# Patient Record
Sex: Male | Born: 1970 | Race: White | Hispanic: No | Smoking: Current every day smoker
Health system: Southern US, Community
[De-identification: ages and names within clinical notes are randomized; demographics above are authoritative.]

## PROBLEM LIST (undated history)

## (undated) DIAGNOSIS — S68012A Complete traumatic metacarpophalangeal amputation of left thumb, initial encounter: Secondary | ICD-10-CM

## (undated) DIAGNOSIS — Z972 Presence of dental prosthetic device (complete) (partial): Secondary | ICD-10-CM

## (undated) DIAGNOSIS — G894 Chronic pain syndrome: Secondary | ICD-10-CM

## (undated) DIAGNOSIS — K219 Gastro-esophageal reflux disease without esophagitis: Secondary | ICD-10-CM

## (undated) DIAGNOSIS — M5432 Sciatica, left side: Secondary | ICD-10-CM

## (undated) DIAGNOSIS — IMO0002 Reserved for concepts with insufficient information to code with codable children: Secondary | ICD-10-CM

## (undated) DIAGNOSIS — G5600 Carpal tunnel syndrome, unspecified upper limb: Secondary | ICD-10-CM

## (undated) DIAGNOSIS — M25549 Pain in joints of unspecified hand: Secondary | ICD-10-CM

## (undated) DIAGNOSIS — M5431 Sciatica, right side: Secondary | ICD-10-CM

## (undated) DIAGNOSIS — D122 Benign neoplasm of ascending colon: Secondary | ICD-10-CM

## (undated) DIAGNOSIS — M1990 Unspecified osteoarthritis, unspecified site: Secondary | ICD-10-CM

## (undated) DIAGNOSIS — M549 Dorsalgia, unspecified: Secondary | ICD-10-CM

## (undated) DIAGNOSIS — G8929 Other chronic pain: Secondary | ICD-10-CM

## (undated) DIAGNOSIS — M542 Cervicalgia: Secondary | ICD-10-CM

## (undated) DIAGNOSIS — D124 Benign neoplasm of descending colon: Secondary | ICD-10-CM

## (undated) DIAGNOSIS — K269 Duodenal ulcer, unspecified as acute or chronic, without hemorrhage or perforation: Secondary | ICD-10-CM

## (undated) HISTORY — PX: TONSILLECTOMY: SUR1361

## (undated) HISTORY — PX: JOINT REPLACEMENT: SHX530

## (undated) HISTORY — DX: Chronic pain syndrome: G89.4

## (undated) HISTORY — PX: HIP SURGERY: SHX245

## (undated) HISTORY — PX: HERNIA REPAIR: SHX51

## (undated) HISTORY — DX: Complete traumatic metacarpophalangeal amputation of left thumb, initial encounter: S68.012A

## (undated) HISTORY — PX: KNEE SURGERY: SHX244

## (undated) HISTORY — DX: Benign neoplasm of ascending colon: D12.2

## (undated) HISTORY — DX: Sciatica, right side: M54.32

## (undated) HISTORY — PX: OTHER SURGICAL HISTORY: SHX169

## (undated) HISTORY — PX: REPLACEMENT TOTAL KNEE: SUR1224

## (undated) HISTORY — DX: Carpal tunnel syndrome, unspecified upper limb: G56.00

## (undated) HISTORY — DX: Sciatica, right side: M54.31

## (undated) HISTORY — PX: SHOULDER SURGERY: SHX246

## (undated) HISTORY — DX: Benign neoplasm of descending colon: D12.4

## (undated) HISTORY — DX: Duodenal ulcer, unspecified as acute or chronic, without hemorrhage or perforation: K26.9

## (undated) HISTORY — PX: SPINAL FUSION: SHX223

---
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
## 2004-12-25 HISTORY — PX: HIP SURGERY: SHX245

## 2005-08-27 ENCOUNTER — Emergency Department: Payer: Self-pay | Admitting: Emergency Medicine

## 2005-08-30 ENCOUNTER — Ambulatory Visit: Payer: Self-pay | Admitting: Emergency Medicine

## 2005-09-03 ENCOUNTER — Emergency Department: Payer: Self-pay | Admitting: Internal Medicine

## 2006-08-05 ENCOUNTER — Emergency Department: Payer: Self-pay | Admitting: Emergency Medicine

## 2006-09-09 ENCOUNTER — Emergency Department: Payer: Self-pay | Admitting: General Practice

## 2006-09-30 ENCOUNTER — Emergency Department: Payer: Self-pay | Admitting: Emergency Medicine

## 2007-06-10 ENCOUNTER — Emergency Department: Payer: Self-pay | Admitting: Unknown Physician Specialty

## 2007-07-09 ENCOUNTER — Emergency Department: Payer: Self-pay | Admitting: Emergency Medicine

## 2007-08-12 ENCOUNTER — Emergency Department: Payer: Self-pay | Admitting: Emergency Medicine

## 2007-08-19 ENCOUNTER — Other Ambulatory Visit: Payer: Self-pay

## 2007-08-19 ENCOUNTER — Emergency Department: Payer: Self-pay | Admitting: Emergency Medicine

## 2007-09-18 ENCOUNTER — Emergency Department: Payer: Self-pay | Admitting: Emergency Medicine

## 2008-01-03 ENCOUNTER — Emergency Department: Payer: Self-pay | Admitting: Emergency Medicine

## 2008-01-29 ENCOUNTER — Emergency Department: Payer: Self-pay | Admitting: Emergency Medicine

## 2008-01-29 ENCOUNTER — Other Ambulatory Visit: Payer: Self-pay

## 2008-02-01 ENCOUNTER — Emergency Department: Payer: Self-pay | Admitting: Internal Medicine

## 2008-02-16 ENCOUNTER — Other Ambulatory Visit: Payer: Self-pay

## 2008-02-16 ENCOUNTER — Emergency Department: Payer: Self-pay | Admitting: Unknown Physician Specialty

## 2008-02-27 ENCOUNTER — Emergency Department: Payer: Self-pay | Admitting: Emergency Medicine

## 2008-03-11 ENCOUNTER — Other Ambulatory Visit: Payer: Self-pay

## 2008-03-11 ENCOUNTER — Emergency Department: Payer: Self-pay | Admitting: Emergency Medicine

## 2008-04-21 ENCOUNTER — Emergency Department: Payer: Self-pay | Admitting: Emergency Medicine

## 2008-08-05 ENCOUNTER — Emergency Department: Payer: Self-pay | Admitting: Emergency Medicine

## 2008-08-11 ENCOUNTER — Emergency Department: Payer: Self-pay | Admitting: Emergency Medicine

## 2008-08-28 ENCOUNTER — Emergency Department: Payer: Self-pay | Admitting: Internal Medicine

## 2008-09-09 ENCOUNTER — Emergency Department: Payer: Self-pay | Admitting: Internal Medicine

## 2008-09-27 ENCOUNTER — Emergency Department: Payer: Self-pay | Admitting: Emergency Medicine

## 2008-10-10 ENCOUNTER — Emergency Department: Payer: Self-pay | Admitting: Unknown Physician Specialty

## 2008-10-22 ENCOUNTER — Emergency Department (HOSPITAL_COMMUNITY): Admission: EM | Admit: 2008-10-22 | Discharge: 2008-10-22 | Payer: Self-pay | Admitting: Emergency Medicine

## 2008-10-25 ENCOUNTER — Emergency Department: Payer: Self-pay | Admitting: Emergency Medicine

## 2008-11-06 ENCOUNTER — Emergency Department (HOSPITAL_COMMUNITY): Admission: EM | Admit: 2008-11-06 | Discharge: 2008-11-06 | Payer: Self-pay | Admitting: Emergency Medicine

## 2008-12-21 ENCOUNTER — Emergency Department: Payer: Self-pay | Admitting: Emergency Medicine

## 2009-02-08 ENCOUNTER — Emergency Department (HOSPITAL_COMMUNITY): Admission: EM | Admit: 2009-02-08 | Discharge: 2009-02-08 | Payer: Self-pay | Admitting: Emergency Medicine

## 2009-02-11 ENCOUNTER — Emergency Department: Payer: Self-pay | Admitting: Emergency Medicine

## 2009-02-12 ENCOUNTER — Emergency Department: Payer: Self-pay | Admitting: Emergency Medicine

## 2009-03-10 ENCOUNTER — Emergency Department: Payer: Self-pay | Admitting: Emergency Medicine

## 2009-03-20 ENCOUNTER — Emergency Department: Payer: Self-pay | Admitting: Emergency Medicine

## 2009-05-04 ENCOUNTER — Encounter
Admission: RE | Admit: 2009-05-04 | Discharge: 2009-05-04 | Payer: Self-pay | Admitting: Physical Medicine & Rehabilitation

## 2009-08-22 ENCOUNTER — Emergency Department: Payer: Self-pay | Admitting: Emergency Medicine

## 2010-10-04 ENCOUNTER — Emergency Department: Payer: Self-pay | Admitting: Emergency Medicine

## 2010-12-15 ENCOUNTER — Emergency Department (HOSPITAL_COMMUNITY)
Admission: EM | Admit: 2010-12-15 | Discharge: 2010-12-15 | Payer: Self-pay | Source: Home / Self Care | Admitting: Emergency Medicine

## 2010-12-19 ENCOUNTER — Emergency Department (HOSPITAL_COMMUNITY)
Admission: EM | Admit: 2010-12-19 | Discharge: 2010-12-19 | Payer: Self-pay | Source: Home / Self Care | Admitting: Emergency Medicine

## 2010-12-25 DEATH — deceased

## 2011-02-08 DIAGNOSIS — M5020 Other cervical disc displacement, unspecified cervical region: Secondary | ICD-10-CM | POA: Insufficient documentation

## 2011-02-28 DIAGNOSIS — G5600 Carpal tunnel syndrome, unspecified upper limb: Secondary | ICD-10-CM | POA: Insufficient documentation

## 2011-07-19 ENCOUNTER — Emergency Department: Payer: Self-pay | Admitting: *Deleted

## 2011-08-06 ENCOUNTER — Emergency Department: Payer: Self-pay | Admitting: Internal Medicine

## 2011-09-05 ENCOUNTER — Emergency Department: Payer: Self-pay | Admitting: Emergency Medicine

## 2011-09-16 ENCOUNTER — Ambulatory Visit: Payer: Self-pay | Admitting: Internal Medicine

## 2011-09-17 ENCOUNTER — Ambulatory Visit: Payer: Self-pay

## 2011-09-18 ENCOUNTER — Emergency Department: Payer: Self-pay | Admitting: Emergency Medicine

## 2011-10-27 ENCOUNTER — Emergency Department: Payer: Self-pay | Admitting: Internal Medicine

## 2011-12-26 DIAGNOSIS — S68111A Complete traumatic metacarpophalangeal amputation of left index finger, initial encounter: Secondary | ICD-10-CM

## 2011-12-26 HISTORY — DX: Complete traumatic metacarpophalangeal amputation of left index finger, initial encounter: S68.111A

## 2011-12-26 HISTORY — PX: AMPUTATION FINGER / THUMB: SUR24

## 2012-04-01 ENCOUNTER — Ambulatory Visit: Payer: Self-pay | Admitting: Orthopedic Surgery

## 2012-05-23 ENCOUNTER — Ambulatory Visit: Payer: Self-pay | Admitting: Rehabilitation

## 2012-07-01 ENCOUNTER — Ambulatory Visit: Payer: Self-pay | Admitting: Internal Medicine

## 2012-07-18 ENCOUNTER — Other Ambulatory Visit (HOSPITAL_COMMUNITY): Payer: Self-pay | Admitting: Orthopedic Surgery

## 2012-07-18 DIAGNOSIS — M25561 Pain in right knee: Secondary | ICD-10-CM

## 2012-07-29 ENCOUNTER — Ambulatory Visit (HOSPITAL_COMMUNITY)
Admission: RE | Admit: 2012-07-29 | Discharge: 2012-07-29 | Disposition: A | Discharge status: Home / Self Care | Payer: Medicaid Other | Source: Ambulatory Visit | Attending: Orthopedic Surgery | Admitting: Orthopedic Surgery

## 2012-07-29 DIAGNOSIS — W19XXXA Unspecified fall, initial encounter: Secondary | ICD-10-CM | POA: Insufficient documentation

## 2012-07-29 DIAGNOSIS — Z96659 Presence of unspecified artificial knee joint: Secondary | ICD-10-CM | POA: Insufficient documentation

## 2012-07-29 DIAGNOSIS — M25561 Pain in right knee: Secondary | ICD-10-CM

## 2012-07-29 DIAGNOSIS — M25569 Pain in unspecified knee: Secondary | ICD-10-CM | POA: Insufficient documentation

## 2012-07-30 ENCOUNTER — Other Ambulatory Visit: Payer: Self-pay | Admitting: Physical Medicine and Rehabilitation

## 2012-07-30 DIAGNOSIS — M545 Low back pain, unspecified: Secondary | ICD-10-CM

## 2012-07-31 ENCOUNTER — Other Ambulatory Visit: Payer: Self-pay | Admitting: Physical Medicine and Rehabilitation

## 2012-07-31 DIAGNOSIS — M545 Low back pain: Secondary | ICD-10-CM

## 2012-07-31 DIAGNOSIS — M79604 Pain in right leg: Secondary | ICD-10-CM

## 2012-08-01 ENCOUNTER — Ambulatory Visit
Admission: RE | Admit: 2012-08-01 | Discharge: 2012-08-01 | Disposition: A | Discharge status: Home / Self Care | Payer: Medicaid Other | Source: Ambulatory Visit | Attending: Physical Medicine and Rehabilitation | Admitting: Physical Medicine and Rehabilitation

## 2012-08-01 ENCOUNTER — Other Ambulatory Visit: Payer: Self-pay

## 2012-08-01 DIAGNOSIS — M79604 Pain in right leg: Secondary | ICD-10-CM

## 2012-08-01 DIAGNOSIS — M545 Low back pain: Secondary | ICD-10-CM

## 2012-08-05 ENCOUNTER — Encounter (HOSPITAL_COMMUNITY): Payer: Self-pay

## 2012-08-05 ENCOUNTER — Emergency Department (HOSPITAL_COMMUNITY)
Admission: EM | Admit: 2012-08-05 | Discharge: 2012-08-05 | Disposition: A | Discharge status: Home / Self Care | Payer: Medicaid Other | Attending: Emergency Medicine | Admitting: Emergency Medicine

## 2012-08-05 DIAGNOSIS — F17200 Nicotine dependence, unspecified, uncomplicated: Secondary | ICD-10-CM | POA: Insufficient documentation

## 2012-08-05 DIAGNOSIS — M549 Dorsalgia, unspecified: Secondary | ICD-10-CM | POA: Insufficient documentation

## 2012-08-05 DIAGNOSIS — M545 Low back pain, unspecified: Secondary | ICD-10-CM

## 2012-08-05 HISTORY — DX: Reserved for concepts with insufficient information to code with codable children: IMO0002

## 2012-08-05 MED ORDER — OXYCODONE-ACETAMINOPHEN 5-325 MG PO TABS: 2.0000 | ORAL_TABLET | ORAL | Status: AC | PRN

## 2012-08-05 NOTE — ED Notes (Signed)
Has orthopeadic md, here for pain med refill, needs neurosurgery for back surgery, has pain in legs and tingling in legs. Was told to come here for pain meds.

## 2012-08-05 NOTE — ED Provider Notes (Signed)
History     CSN: [id number]  Arrival date & time 08/05/12  1103   First MD Initiated Contact with Patient 08/05/12 1215      Chief Complaint  Patient presents with  . Back Pain  . Numbness    (Consider location/radiation/quality/duration/timing/severity/associated sxs/prior treatment) HPI Comments: Patient presents today with a chief complaint of lower back pain.  Patient has been evaluated by Orthopedics in the past and has an appointment scheduled with Neurosurgery in 2 days.  He reports that his pain is no different today than it has been in the past.  No acute injury or trauma.  He had a MRI done of his lumbar spine on 08/01/12 which showed broad-based disc herniation and facet hypertrophy at L3-4, L4-5, and L5-S1.  He presents today requesting more pain medication.  He had been taking Oxycodone 10mg /325 mg for the pain.     Patient is a 41 y.o. male presenting with back pain. The history is provided by the patient.  Back Pain  This is a chronic problem. The problem occurs constantly. The problem has not changed since onset.The pain is associated with no known injury. The pain is present in the lumbar spine. The pain radiates to the left thigh and right thigh. The symptoms are aggravated by bending and twisting (ambulation). Pertinent negatives include no fever, no numbness, no abdominal pain, no bowel incontinence, no perianal numbness, no bladder incontinence, no dysuria, no paresthesias, no paresis, no tingling and no weakness.    Past Medical History  Diagnosis Date  . Herniated disc     No past surgical history on file.  No family history on file.  History  Substance Use Topics  . Smoking status: Current Everyday Smoker  . Smokeless tobacco: Not on file  . Alcohol Use: No      Review of Systems  Constitutional: Negative for fever and chills.  Gastrointestinal: Negative for nausea, vomiting, abdominal pain and bowel incontinence.  Genitourinary: Negative for bladder  incontinence and dysuria.       No bowel or bladder incontinence No urinary retention  Musculoskeletal: Positive for back pain. Negative for gait problem.  Skin: Negative for color change and rash.  Neurological: Negative for tingling, weakness, numbness and paresthesias.    Allergies  Flexeril; Codeine; Latex; and Tramadol  Home Medications   Current Outpatient Rx  Name Route Sig Dispense Refill  . OXYCODONE-ACETAMINOPHEN 10-325 MG PO TABS Oral Take 1 tablet by mouth every 4 (four) hours as needed. For pain.    Marland Kitchen PREDNISONE (PAK) PO Oral Take by mouth daily. 6 day dose pak.      BP 170/108  Pulse 78  Temp 98.7 F (37.1 C) (Oral)  Resp 16  SpO2 99%  Physical Exam  Nursing note and vitals reviewed. Constitutional: He is oriented to person, place, and time. He appears well-developed and well-nourished.       Uncomfortable appearing  HENT:  Head: Normocephalic and atraumatic.  Neck: Normal range of motion and full passive range of motion without pain. Neck supple. No spinous process tenderness and no muscular tenderness present. No rigidity. Normal range of motion present. No Brudzinski's sign noted.  Cardiovascular: Normal rate, regular rhythm, normal heart sounds and intact distal pulses.   Pulmonary/Chest: Effort normal and breath sounds normal.  Musculoskeletal:       Cervical back: He exhibits normal range of motion, no tenderness, no bony tenderness and no pain.       Thoracic back: He exhibits no  tenderness, no bony tenderness and no pain.       Lumbar back: He exhibits tenderness, bony tenderness and pain. He exhibits no spasm and normal pulse.       Right foot: He exhibits no swelling.       Left foot: He exhibits no swelling.       Bilateral lower extremities nontender without color change, baseline range of motion of extremities with intact distal pulses, capillary refill less than 2 seconds bilaterally.  Pt has increased pain w ROM of lumbar spine. Pain w ambulation,  no sign of ataxia.  Neurological: He is alert and oriented to person, place, and time. He has normal strength and normal reflexes. No sensory deficit. Gait (no ataxia, slowed and hunched d/t pain ) abnormal.       Sensation at baseline for light touch in all 4 distal extremities, motor symmetric & bilateral 5/5 (hips: abduction, adduction, flexion; knee: flexion & extension; foot: dorsiflexion, plantar flexion, toes: dorsi flexion) Patellar & ankle reflexes intact.   Skin: Skin is warm and dry. No rash noted. He is not diaphoretic. No erythema. No pallor.  Psychiatric: He has a normal mood and affect.    ED Course  Procedures (including critical care time)  Labs Reviewed - No data to display No results found.   No diagnosis found.  Looked up patient in the narcotic database.  Patient last had 13 day supply of Percocet filled on 07/24/12.    MDM  Patient with back pain.  No neurological deficits and normal neuro exam.  Patient can walk but states is painful.  No loss of bowel or bladder control.  No concern for cauda equina.  No fever, night sweats, weight loss, h/o cancer, IVDU. Patient with recent MRI.   Patient has follow up scheduled with Neurosurgery in two days.  Patient discharged home with short course of pain medication.          Pascal Lux [place], PA-C 08/05/12 1629

## 2012-08-09 NOTE — ED Provider Notes (Signed)
Medical screening examination/treatment/procedure(s) were performed by non-physician practitioner and as supervising physician I was immediately available for consultation/collaboration.   Suzi Roots, MD 08/09/12 [phone]

## 2012-08-20 ENCOUNTER — Encounter (HOSPITAL_COMMUNITY): Payer: Self-pay | Admitting: *Deleted

## 2012-08-20 ENCOUNTER — Emergency Department (HOSPITAL_COMMUNITY)
Admission: EM | Admit: 2012-08-20 | Discharge: 2012-08-20 | Disposition: A | Discharge status: Home / Self Care | Payer: Medicaid Other | Attending: Emergency Medicine | Admitting: Emergency Medicine

## 2012-08-20 DIAGNOSIS — Z888 Allergy status to other drugs, medicaments and biological substances status: Secondary | ICD-10-CM | POA: Insufficient documentation

## 2012-08-20 DIAGNOSIS — Z981 Arthrodesis status: Secondary | ICD-10-CM | POA: Insufficient documentation

## 2012-08-20 DIAGNOSIS — F17200 Nicotine dependence, unspecified, uncomplicated: Secondary | ICD-10-CM | POA: Insufficient documentation

## 2012-08-20 DIAGNOSIS — M549 Dorsalgia, unspecified: Secondary | ICD-10-CM | POA: Insufficient documentation

## 2012-08-20 DIAGNOSIS — Z91040 Latex allergy status: Secondary | ICD-10-CM | POA: Insufficient documentation

## 2012-08-20 DIAGNOSIS — IMO0002 Reserved for concepts with insufficient information to code with codable children: Secondary | ICD-10-CM | POA: Insufficient documentation

## 2012-08-20 DIAGNOSIS — Z885 Allergy status to narcotic agent status: Secondary | ICD-10-CM | POA: Insufficient documentation

## 2012-08-20 MED ORDER — OXYCODONE-ACETAMINOPHEN 5-325 MG PO TABS: 2.0000 | ORAL_TABLET | Freq: Four times a day (QID) | ORAL | Status: DC | PRN

## 2012-08-20 MED ORDER — MORPHINE SULFATE 4 MG/ML IJ SOLN
4.0000 mg | Freq: Once | INTRAMUSCULAR | Status: AC
  Administered 2012-08-20: 4 mg via INTRAMUSCULAR
  Filled 2012-08-20: qty 1

## 2012-08-20 NOTE — ED Provider Notes (Signed)
History     CSN: [id number]  Arrival date & time 08/20/12  6295   First MD Initiated Contact with Patient 08/20/12 0818      Chief Complaint  Patient presents with  . Back Pain    (Consider location/radiation/quality/duration/timing/severity/associated sxs/prior treatment) Patient is a 41 y.o. male presenting with back pain. The history is provided by the patient.  Back Pain  The current episode started more than 1 week ago (4 weeks). The problem occurs constantly. The problem has been gradually worsening. The pain is associated with lifting heavy objects. The pain is present in the lumbar spine. The quality of the pain is described as shooting. Radiates to: left calf and right calf. The pain is severe. The symptoms are aggravated by bending and twisting. The pain is the same all the time. Associated symptoms include leg pain. Pertinent negatives include no chest pain, no fever, no numbness, no headaches, no abdominal pain, no bowel incontinence, no perianal numbness, no bladder incontinence, no dysuria and no pelvic pain. He has tried NSAIDs (Percocet) for the symptoms. Improvement on treatment: significant relief with Percocet.  No relief with ibuprofen and Tylenol.    Past Medical History  Diagnosis Date  . Herniated disc     Past Surgical History  Procedure Date  . Neck fusion   . Knee surgery   . Hernia repair     No family history on file.  History  Substance Use Topics  . Smoking status: Current Everyday Smoker  . Smokeless tobacco: Not on file  . Alcohol Use: No      Review of Systems  Constitutional: Negative for fever and chills.  HENT: Negative.   Eyes: Negative.   Respiratory: Negative for cough and shortness of breath.   Cardiovascular: Negative for chest pain and palpitations.  Gastrointestinal: Negative for nausea, vomiting, abdominal pain, diarrhea, constipation and bowel incontinence.  Genitourinary: Negative.  Negative for bladder incontinence,  dysuria and pelvic pain.  Musculoskeletal: Positive for back pain.  Skin: Negative.   Neurological: Negative.  Negative for numbness and headaches.  All other systems reviewed and are negative.    Allergies  Flexeril; Codeine; Latex; and Tramadol  Home Medications   Current Outpatient Rx  Name Route Sig Dispense Refill  . ACETAMINOPHEN 500 MG PO TABS Oral Take 500 mg by mouth every 6 (six) hours as needed. For pain    . IBUPROFEN 200 MG PO TABS Oral Take 200 mg by mouth every 6 (six) hours as needed. For pain      BP 149/109  Pulse 78  Temp 98 F (36.7 C) (Oral)  Resp 16  SpO2 99%  Physical Exam  Nursing note and vitals reviewed. Constitutional: He is oriented to person, place, and time. He appears well-developed and well-nourished. No distress.  HENT:  Head: Normocephalic and atraumatic.  Eyes: Conjunctivae are normal.  Neck: Neck supple.  Cardiovascular: Normal rate, regular rhythm, normal heart sounds and intact distal pulses.   Pulmonary/Chest: Effort normal and breath sounds normal. He has no wheezes. He has no rales.  Abdominal: Soft. He exhibits no distension. There is no tenderness.  Genitourinary: Rectal exam shows anal tone normal.  Musculoskeletal: Normal range of motion.       Lumbar back: He exhibits tenderness and pain. He exhibits no bony tenderness and no deformity.       Pain reproduced with straight leg raise bilaterally  Neurological: He is alert and oriented to person, place, and time. He has normal strength.  No cranial nerve deficit or sensory deficit.  Reflex Scores:      Patellar reflexes are 2+ on the right side and 2+ on the left side.      Achilles reflexes are 2+ on the right side and 2+ on the left side. Skin: Skin is warm and dry.    ED Course  Procedures (including critical care time)  Labs Reviewed - No data to display No results found.   1. Back pain       MDM  41 yo male with PMHx of lumbar back pain with radiation down  bilateral legs since injury suffered 07/17/12.  Pt has been evaluated by Orthopedic Surgery and Neurosurgery and has evaluation scheduled with Urology in 3 days.  Pt currently out of pain meds and neither Orthopedic Surgeon nor PCP would refill meds as pt now being seen by neurosurgeon.  Pt next sees neurosurgeon on 09/02/12.  MRI from 07/31/12 reviewed and showed broad based herniations at L3-4, L4-5, and L5-S1.  No bowel or bladder incontinence.  No perineal numbness.  Nml rectal tone.  No signs of cauda equina syndrome.  AF, VSS.  Physical exam as documented above.    Will give IM morphine in the ED and discharge with course of pain medication until he can see neurosurgeon.  Pt requesting additional contact information and will provide neurosurgery referral.  Tx plan discussed with pt who voiced understanding and will follow-up.  Return precautions provided.        Cherre Robins, MD 08/20/12 [phone]

## 2012-08-20 NOTE — ED Provider Notes (Signed)
I saw and evaluated the patient, reviewed the resident's note and I agree with the findings and plan.  Cheri Guppy, MD 08/20/12 1005

## 2012-08-20 NOTE — ED Notes (Signed)
Pt reports injured back July 24- has three herniated discs in back. Pt seeing [facility]- states MD there would not refill medication and to see PCP, PCP would not refill medication. Told to take tylenol/ibuprofen. States out of percocet 10mg  since Sunday and steroid dose pack. Appt to see neurosurgeon in October.

## 2012-08-20 NOTE — ED Provider Notes (Signed)
I saw and evaluated the patient, reviewed the resident's note and I agree with the findings and plan.hx of back pain  For several weeks after lifting heavy object.  Says pain radiates to legs bilat and has numbness in toes.   Has to sit down to urinate and defecate.   Says decreased sensation around bottom.  No incontinence.  No fever, chills.   Has been seen by orthopedist and [facility]. hadmri showing some disc dz.   nsu wants him to get urology eval before doing surgery. Pain too severe to wait.  His orthopedist and pcp will not give him analgesics.  He is in significant pain.  Tender paraspinal area.  Will tx with narcotics and give name for second opinion as requested by the pt.   Refer to Dr. Wynetta Emery.   Cheri Guppy, MD 08/20/12 1005

## 2012-08-22 ENCOUNTER — Emergency Department (HOSPITAL_COMMUNITY)
Admission: EM | Admit: 2012-08-22 | Discharge: 2012-08-22 | Disposition: A | Discharge status: Home / Self Care | Payer: Medicaid Other | Attending: Emergency Medicine | Admitting: Emergency Medicine

## 2012-08-22 ENCOUNTER — Encounter (HOSPITAL_COMMUNITY): Payer: Self-pay | Admitting: Emergency Medicine

## 2012-08-22 DIAGNOSIS — Z981 Arthrodesis status: Secondary | ICD-10-CM | POA: Insufficient documentation

## 2012-08-22 DIAGNOSIS — M5126 Other intervertebral disc displacement, lumbar region: Secondary | ICD-10-CM | POA: Insufficient documentation

## 2012-08-22 DIAGNOSIS — F17200 Nicotine dependence, unspecified, uncomplicated: Secondary | ICD-10-CM | POA: Insufficient documentation

## 2012-08-22 DIAGNOSIS — G8929 Other chronic pain: Secondary | ICD-10-CM | POA: Insufficient documentation

## 2012-08-22 DIAGNOSIS — Z91040 Latex allergy status: Secondary | ICD-10-CM | POA: Insufficient documentation

## 2012-08-22 DIAGNOSIS — M549 Dorsalgia, unspecified: Secondary | ICD-10-CM

## 2012-08-22 DIAGNOSIS — Z886 Allergy status to analgesic agent status: Secondary | ICD-10-CM | POA: Insufficient documentation

## 2012-08-22 MED ORDER — KETOROLAC TROMETHAMINE 30 MG/ML IJ SOLN
30.0000 mg | Freq: Once | INTRAMUSCULAR | Status: AC
  Administered 2012-08-22: 30 mg via INTRAMUSCULAR
  Filled 2012-08-22: qty 1

## 2012-08-22 MED ORDER — DEXAMETHASONE 4 MG PO TABS
8.0000 mg | ORAL_TABLET | Freq: Once | ORAL | Status: AC
  Administered 2012-08-22: 8 mg via ORAL
  Filled 2012-08-22: qty 2

## 2012-08-22 MED ORDER — OXYCODONE-ACETAMINOPHEN 7.5-325 MG PO TABS: 1.0000 | ORAL_TABLET | ORAL | Status: DC | PRN

## 2012-08-22 MED ORDER — HYDROMORPHONE HCL PF 1 MG/ML IJ SOLN
1.0000 mg | Freq: Once | INTRAMUSCULAR | Status: AC
  Administered 2012-08-22: 1 mg via INTRAMUSCULAR
  Filled 2012-08-22: qty 1

## 2012-08-22 MED ORDER — MELOXICAM 7.5 MG PO TABS: 7.5000 mg | ORAL_TABLET | Freq: Two times a day (BID) | ORAL | Status: DC | PRN

## 2012-08-22 MED ORDER — DIAZEPAM 5 MG PO TABS
5.0000 mg | ORAL_TABLET | Freq: Once | ORAL | Status: AC
  Administered 2012-08-22: 5 mg via ORAL
  Filled 2012-08-22: qty 1

## 2012-08-22 MED ORDER — DIAZEPAM 5 MG PO TABS: 5.0000 mg | ORAL_TABLET | Freq: Three times a day (TID) | ORAL | Status: DC | PRN

## 2012-08-22 NOTE — ED Provider Notes (Signed)
History    41 year old male with back pain. Patient is a history of herniated disc. Chronic pain but worsening since this past Tuesday. Patient has been previously evaluated by neurosurgery. He says they tend to make appointment but cannot be seen. Currently out of his pain medication requesting more. No acute numbness, tingling or loss of strength. No bladder or bowel incontinence or retention.  CSN: [id number]  Arrival date & time 08/22/12  8295   First MD Initiated Contact with Patient 08/22/12 [phone]      Chief Complaint  Patient presents with  . Back Pain    (Consider location/radiation/quality/duration/timing/severity/associated sxs/prior treatment) HPI  Past Medical History  Diagnosis Date  . Herniated disc     Past Surgical History  Procedure Date  . Neck fusion   . Knee surgery   . Hernia repair     No family history on file.  History  Substance Use Topics  . Smoking status: Current Everyday Smoker  . Smokeless tobacco: Not on file  . Alcohol Use: No      Review of Systems   Review of symptoms negative unless otherwise noted in HPI.   Allergies  Flexeril; Codeine; Latex; and Tramadol  Home Medications   Current Outpatient Rx  Name Route Sig Dispense Refill  . ACETAMINOPHEN 500 MG PO TABS Oral Take 500 mg by mouth every 6 (six) hours as needed. For pain    . IBUPROFEN 200 MG PO TABS Oral Take 200 mg by mouth every 6 (six) hours as needed. For pain    . OXYCODONE-ACETAMINOPHEN 5-325 MG PO TABS Oral Take 2 tablets by mouth every 6 (six) hours as needed for pain. 60 tablet 0    BP 148/87  Pulse 75  Temp 97.5 F (36.4 C) (Oral)  Resp 17  Ht 6' (1.829 m)  Wt 190 lb (86.183 kg)  BMI 25.77 kg/m2  SpO2 97%  Physical Exam  Nursing note and vitals reviewed. Constitutional: He appears well-developed and well-nourished. No distress.  HENT:  Head: Normocephalic and atraumatic.  Eyes: Conjunctivae are normal. Right eye exhibits no discharge. Left eye  exhibits no discharge.  Neck: Neck supple.  Cardiovascular: Normal rate, regular rhythm and normal heart sounds.  Exam reveals no gallop and no friction rub.   No murmur heard. Pulmonary/Chest: Effort normal and breath sounds normal. No respiratory distress.  Abdominal: Soft. He exhibits no distension. There is no tenderness.  Musculoskeletal: He exhibits no edema and no tenderness.       Mild tenderness in the left paraspinal lumbar region. No concerning overlying skin changes to  Neurological: He is alert. He exhibits normal muscle tone. Coordination normal.       Plantar flexion/extension, knee and hip flex/ect intact. Sensation is intact to light touch. Patellar reflexes are one plus bilaterally. Patient was observed to ambulate without apparent difficulty  Skin: Skin is warm and dry.  Psychiatric: He has a normal mood and affect. His behavior is normal. Thought content normal.    ED Course  Procedures (including critical care time)  Labs Reviewed - No data to display No results found.   1. Back pain       MDM  41 year old male with back pain. This is chronic in nature. Recent MRI within the last month with results as follows:  1. Broad-based disc herniation and facet hypertrophy at L3-4, L4-  5, and L5-S1.  2. No significant focal stenosis at L3-4.  3. Mild lateral recess narrowing bilaterally at L4-5 is  worse on  the left.  4. Minimal foraminal narrowing at L5-S1.  Patient has a nonfocal neurological examination. Requesting pain medications. Prescriptions were provided. Patient was encouraged to followup with his neurosurgeon and/or orthospine.         Raeford Razor, MD 08/31/12 0110

## 2012-08-22 NOTE — ED Notes (Signed)
Reports recent back injury with herniated disks, was seen for increasing pain on tues and presents today for same, reports several attempts to see neuro and PCP - who will not refill pt's pain meds, c/o increased left leg pain, ambulatory to dep, NAD

## 2012-08-27 ENCOUNTER — Encounter (HOSPITAL_COMMUNITY): Payer: Self-pay | Admitting: Emergency Medicine

## 2012-08-27 ENCOUNTER — Emergency Department (HOSPITAL_COMMUNITY)
Admission: EM | Admit: 2012-08-27 | Discharge: 2012-08-27 | Disposition: A | Discharge status: Home / Self Care | Payer: Self-pay | Attending: Emergency Medicine | Admitting: Emergency Medicine

## 2012-08-27 DIAGNOSIS — F17200 Nicotine dependence, unspecified, uncomplicated: Secondary | ICD-10-CM | POA: Insufficient documentation

## 2012-08-27 DIAGNOSIS — G8929 Other chronic pain: Secondary | ICD-10-CM | POA: Insufficient documentation

## 2012-08-27 DIAGNOSIS — M5126 Other intervertebral disc displacement, lumbar region: Secondary | ICD-10-CM | POA: Insufficient documentation

## 2012-08-27 HISTORY — DX: Dorsalgia, unspecified: M54.9

## 2012-08-27 HISTORY — DX: Other chronic pain: G89.29

## 2012-08-27 HISTORY — DX: Cervicalgia: M54.2

## 2012-08-27 MED ORDER — OXYCODONE-ACETAMINOPHEN 5-325 MG PO TABS: ORAL_TABLET | ORAL | Status: DC

## 2012-08-27 MED ORDER — FENTANYL CITRATE 0.05 MG/ML IJ SOLN
100.0000 ug | Freq: Once | INTRAMUSCULAR | Status: AC
  Administered 2012-08-27: 100 ug via INTRAMUSCULAR
  Filled 2012-08-27: qty 2

## 2012-08-27 MED ORDER — NAPROXEN 250 MG PO TABS: 250.0000 mg | ORAL_TABLET | Freq: Two times a day (BID) | ORAL | Status: DC

## 2012-08-27 MED ORDER — METHOCARBAMOL 500 MG PO TABS: 1000.0000 mg | ORAL_TABLET | Freq: Four times a day (QID) | ORAL | Status: DC | PRN

## 2012-08-27 NOTE — ED Notes (Signed)
Pt c/o lower back pain after injury in July; pt sts some urinary urgency today; pt denies incontinence but sts sometimes has urgency

## 2012-08-27 NOTE — ED Notes (Signed)
Patient presents to ed c/o pain in back, states he has a history of 3 herniated disc. After bending over 7/24. States he was seen by [facility] however they will no longer refill his pain medication. Was seen to [facility] and was told by Dr. Gerlene Fee that there wasn't anything wrong with his back. Patient is wanting pain medication. Was seen by a Urologist on Thurs and told he had bladder issues as a result of his back.

## 2012-08-27 NOTE — ED Provider Notes (Signed)
History    This chart was scribed for Johnny Anger, DO, MD by Smitty Pluck. The patient was seen in room TR09C and the patient's care was started at 12:00PM.   CSN: [id number]  Arrival date & time 08/27/12  1022   First MD Initiated Contact with Patient 08/27/12 1200      Chief Complaint  Patient presents with  . Back Pain    The history is provided by the patient.   Johnny [name] is a 41 y.o. male who presents to the Emergency Department complaining of gradual onset and persistence of constant acute flair of his chronic low back "pain" for the past several months.  Denies any change in his usual chronic pain pattern.  Pain worsens with palpation of the area and body position changes. Denies incont/retention of bowel or bladder, no saddle anesthesia, no focal motor weakness, no tingling/numbness in extremities, no fevers, no injury, no abd pain.   The symptoms have been associated with no other complaints. The patient has a significant history of similar symptoms previously, recently being evaluated for this complaint and multiple prior evals for same. Denies radiation. Denies any other pain.  States he is here for a rx for pain meds because his Ortho MD, PMD, and Neurosurgeon all will not refill his pain meds.    Past Medical History  Diagnosis Date  . Herniated disc   . Chronic back pain   . Chronic neck pain     Past Surgical History  Procedure Date  . Neck fusion   . Knee surgery   . Hernia repair     History  Substance Use Topics  . Smoking status: Current Everyday Smoker  . Smokeless tobacco: Not on file  . Alcohol Use: No      Review of Systems ROS: Statement: All systems negative except as marked or noted in the HPI; Constitutional: Negative for fever and chills. ; ; Eyes: Negative for eye pain, redness and discharge. ; ; ENMT: Negative for ear pain, hoarseness, nasal congestion, sinus pressure and sore throat. ; ; Cardiovascular: Negative for chest pain,  palpitations, diaphoresis, dyspnea and peripheral edema. ; ; Respiratory: Negative for cough, wheezing and stridor. ; ; Gastrointestinal: Negative for nausea, vomiting, diarrhea, abdominal pain, blood in stool, hematemesis, jaundice and rectal bleeding. . ; ; Genitourinary: Negative for dysuria, flank pain and hematuria. ; ; Musculoskeletal: +LBP. Negative for neck pain. Negative for swelling and trauma.; ; Skin: Negative for pruritus, rash, abrasions, blisters, bruising and skin lesion.; ; Neuro: Negative for headache, lightheadedness and neck stiffness. Negative for weakness, altered level of consciousness , altered mental status, extremity weakness, paresthesias, involuntary movement, seizure and syncope.       Allergies  Flexeril; Codeine; Latex; and Tramadol  Home Medications   Current Outpatient Rx  Name Route Sig Dispense Refill  . ACETAMINOPHEN 500 MG PO TABS Oral Take 500 mg by mouth every 6 (six) hours as needed. For pain    . DIAZEPAM 5 MG PO TABS Oral Take 5 mg by mouth every 8 (eight) hours as needed. For pain    . IBUPROFEN 200 MG PO TABS Oral Take 200 mg by mouth every 6 (six) hours as needed. For pain    . MELOXICAM 7.5 MG PO TABS Oral Take 7.5 mg by mouth 2 (two) times daily as needed. For pain    . OXYCODONE-ACETAMINOPHEN 7.5-325 MG PO TABS Oral Take 1-2 tablets by mouth every 4 (four) hours as needed. For pain  BP 134/108  Pulse 100  Temp 98.2 F (36.8 C) (Oral)  Resp 20  SpO2 96%  Physical Exam 1200: Physical examination:  Nursing notes reviewed; Vital signs and O2 SAT reviewed;  Constitutional: Well developed, Well nourished, Well hydrated, In no acute distress; Head:  Normocephalic, atraumatic; Eyes: EOMI, PERRL, No scleral icterus; ENMT: Mouth and pharynx normal, Mucous membranes moist; Neck: Supple, Full range of motion, No lymphadenopathy; Cardiovascular: Regular rate and rhythm, No murmur, rub, or gallop; Respiratory: Breath sounds clear & equal bilaterally,  No rales, rhonchi, wheezes.  Speaking full sentences with ease, Normal respiratory effort/excursion; Chest: Nontender, Movement normal;; Spine:  No midline CS, TS, LS tenderness.  +TTP bilat lumbar paraspinal area.; Genitourinary: No CVA tenderness; Extremities: Pulses normal, No tenderness, No edema, No calf edema or asymmetry.; Neuro: AA&Ox3, Major CN grossly intact.  Speech clear. Strength 5/5 equal bilat UE's and LE's, including great toe dorsiflexion.  DTR 2/4 equal bilat UE's and LE's.  No gross sensory deficits.  Neg straight leg raises bilat when pt is distracted.;;.; Skin: Color normal, Warm, Dry.   ED Course  Procedures    MDM  MDM Reviewed: previous chart, nursing note and vitals Reviewed previous: MRI   08/01/12 MRI LS: *RADIOLOGY REPORT*  Clinical Data: Back pain extending into the lower extremities bilaterally. Difficulty urinating.  MRI LUMBAR SPINE WITHOUT CONTRAST  Technique: Multiplanar and multiecho pulse sequences of the lumbar spine were obtained without intravenous contrast.  Comparison: Lumbar spine radiographs 10/22/2008.  Findings: Normal signal is present in the conus medullaris which terminates at T12-L1. Anterior end plate marrow changes are  present along superior endplate of L1. Mild endplate degenerative changes are present at L5-S1. Marrow signal, vertebral body  heights, and alignment are otherwise normal. Limited imaging of the abdomen is unremarkable. The disc levels at L2-3 and above are normal.  L3-4: A broad-based disc herniation extends into the inferior  recess of both neural foramina without significant focal stenosis.  Mild facet hypertrophy is present.  L4-5: A broad-based disc herniation is present. Mild facet hypertrophy is noted. Mild lateral recess narrowing is worse on  the left. The disc herniates into the inferior recess of both neural foramina without significant focal stenosis.  L5-S1: There is loss of disc height. Mild broad-based disc  herniation is present. There is some effacement of CSF without  significant central canal stenosis. The disc herniates into the neural foramina bilaterally with minimal narrowing.  IMPRESSION:  1. Broad-based disc herniation and facet hypertrophy at L3-4, L4-5, and L5-S1.  2. No significant focal stenosis at L3-4.  3. Mild lateral recess narrowing bilaterally at L4-5 is worse on the left.  4. Minimal foraminal narrowing at L5-S1.  Original Report Authenticated By: Jamesetta Orleans. MATTERN, M.D.    1215:  MRI as above.  Long hx of chronic pain with multiple ED visits for same.  Pt endorses acute flair of his usual long standing chronic pain today, no change from his usual chronic pain pattern.  Pt encouraged to f/u with his PMD and Pain Management doctor for good continuity of care and control of his chronic pain.  Verb understanding.      I personally performed the services described in this documentation, which was scribed in my presence. The recorded information has been reviewed and considered.  Lyrick Lagrand Allison Quarry, DO 08/29/12 1627

## 2012-08-30 ENCOUNTER — Emergency Department (HOSPITAL_COMMUNITY)
Admission: EM | Admit: 2012-08-30 | Discharge: 2012-08-30 | Disposition: A | Discharge status: Home / Self Care | Payer: Self-pay | Attending: Emergency Medicine | Admitting: Emergency Medicine

## 2012-08-30 ENCOUNTER — Encounter (HOSPITAL_COMMUNITY): Payer: Self-pay | Admitting: Family Medicine

## 2012-08-30 DIAGNOSIS — Z981 Arthrodesis status: Secondary | ICD-10-CM | POA: Insufficient documentation

## 2012-08-30 DIAGNOSIS — F17200 Nicotine dependence, unspecified, uncomplicated: Secondary | ICD-10-CM | POA: Insufficient documentation

## 2012-08-30 DIAGNOSIS — M549 Dorsalgia, unspecified: Secondary | ICD-10-CM

## 2012-08-30 DIAGNOSIS — M545 Low back pain, unspecified: Secondary | ICD-10-CM | POA: Insufficient documentation

## 2012-08-30 DIAGNOSIS — G8929 Other chronic pain: Secondary | ICD-10-CM | POA: Insufficient documentation

## 2012-08-30 MED ORDER — OXYCODONE-ACETAMINOPHEN 10-325 MG PO TABS: 1.0000 | ORAL_TABLET | Freq: Four times a day (QID) | ORAL | Status: DC | PRN

## 2012-08-30 MED ORDER — OXYCODONE-ACETAMINOPHEN 5-325 MG PO TABS
2.0000 | ORAL_TABLET | Freq: Once | ORAL | Status: AC
  Administered 2012-08-30: 2 via ORAL
  Filled 2012-08-30: qty 2

## 2012-08-30 NOTE — ED Notes (Signed)
Per pt sts chronic lower back pain. sts suppose to be seen at the pain clinic on the 25th of September. sts urinary incontinence

## 2012-08-30 NOTE — ED Provider Notes (Signed)
History   This chart was scribed for Gerhard Munch, MD by Melba Coon. The patient was seen in room TR06C/TR06C and the patient's care was started at 2:54PM.    CSN: [id number]  Arrival date & time 08/30/12  1257   First MD Initiated Contact with Patient 08/30/12 1417      Chief Complaint  Patient presents with  . Back Pain     The history is provided by the patient. No language interpreter was used.   Johnny [name] is a 41 y.o. male who presents to the Emergency Department complaining of constant, moderate to severe lower back pain with an onset since July 24th, 1.5 months ago. Pt states that he went to an orthopedist, got an MRI, and found out he had 3 herniated discs. Pt states that the doctor would not Rx him any more pain meds. Pt was told to go to a pain clinic, but pt states that it has taken a while to get into one. Pt was being Rx oxycodone which alleviated the pain. However, pt is supposed to be seen at a pain clinic later this month and just want pain meds until that time. Pt also states that he had to move furniture which aggravated the back pain. Standing and sitting for long periods of time aggravates the pain. Buttocks pain and left foot numbness present. Ambulation with pain, sleep and appetite decreased compared to baseline. Bladder dysfunction present; excess dribbling present. No HA, fever, neck pain, sore throat, rash, CP, SOB, abd pain, n/v/d, dysuria, incontinence, or extremity edema, weakness, numbness, or tingling. No other pertinent medical symptoms.   Past Medical History  Diagnosis Date  . Herniated disc   . Chronic back pain   . Chronic neck pain     Past Surgical History  Procedure Date  . Neck fusion   . Knee surgery   . Hernia repair     History reviewed. No pertinent family history.  History  Substance Use Topics  . Smoking status: Current Everyday Smoker  . Smokeless tobacco: Not on file  . Alcohol Use: No      Review of Systems    Musculoskeletal: Positive for back pain.  Neurological: Negative for weakness.   10 Systems reviewed and all are negative for acute change except as noted in the HPI.   Allergies  Flexeril; Codeine; Latex; and Tramadol  Home Medications   Current Outpatient Rx  Name Route Sig Dispense Refill  . METHOCARBAMOL 500 MG PO TABS Oral Take 2 tablets (1,000 mg total) by mouth 4 (four) times daily as needed (muscle spasm/pain). 25 tablet 0  . NAPROXEN 250 MG PO TABS Oral Take 1 tablet (250 mg total) by mouth 2 (two) times daily with a meal. 14 tablet 0  . OXYCODONE-ACETAMINOPHEN 5-325 MG PO TABS  1 or 2 tabs PO q6h prn pain 25 tablet 0    There were no vitals taken for this visit.  Physical Exam  Nursing note and vitals reviewed. Constitutional: He is oriented to person, place, and time. He appears well-developed and well-nourished.  HENT:  Head: Normocephalic and atraumatic.  Eyes: EOM are normal. Pupils are equal, round, and reactive to light.  Neck: Normal range of motion. Neck supple.  Cardiovascular: Normal rate, normal heart sounds and intact distal pulses.   Pulmonary/Chest: Effort normal and breath sounds normal.  Abdominal: Bowel sounds are normal. He exhibits no distension. There is no tenderness.  Musculoskeletal: Normal range of motion. He exhibits no edema  and no tenderness.       Patient can flex/extend both hip, knees and ankles.  Neurological: He is alert and oriented to person, place, and time. He has normal strength. No cranial nerve deficit or sensory deficit. He exhibits normal muscle tone. Coordination normal.       5/5 strength in both hips, though limited 2/2 pain.  Skin: Skin is warm and dry. No rash noted.  Psychiatric: He has a normal mood and affect.    ED Course  Procedures (including critical care time)  DIAGNOSTIC STUDIES:  COORDINATION OF CARE:  3:00PM - pt is advised that he can only receive 3 days of pain meds from the ED. Pt will be Rx percocet.  Pt ready for d/c.   Labs Reviewed - No data to display No results found.   No diagnosis found.  I reviewed the patient's MRI results from last month.  MDM  I personally performed the services described in this documentation, which was scribed in my presence. The recorded information has been reviewed and considered.  This generally well-appearing male presents with concerns of persistent low back pain, no current medications at home. The patient's description of persistent pain with increasingly frequent episodes of left lower extremity dysesthesia, both denial of any incontinence, lack of ambulatory capacity, fevers, chills is consistent with chronic disc lesions, with low suspicion for acute neurologic change.  Patient has a Midwife, as well as pain management specialists and orthopedists with whom he is followed up in the next weeks.  His request for analgesics was accommodated with several days of medication and he was discharged in stable condition.     Gerhard Munch, MD 08/30/12 1511

## 2012-08-30 NOTE — ED Notes (Signed)
Pt reports herniated disk since July 24th.  Pt was being seen by [facility] for treatment of pain.  MD stopped writing rx for pain meds until pt is seen at pain clinic.  Pt has appt on Sept 25th with pain clinic but ortho is out of town and cannot write rx.  Pt moved furniture this week and has been in pain 10/10 since.  Pt alert oriented X4

## 2012-09-01 ENCOUNTER — Emergency Department (HOSPITAL_COMMUNITY)
Admission: EM | Admit: 2012-09-01 | Discharge: 2012-09-01 | Disposition: A | Discharge status: Home / Self Care | Payer: Medicaid Other | Attending: Emergency Medicine | Admitting: Emergency Medicine

## 2012-09-01 ENCOUNTER — Encounter (HOSPITAL_COMMUNITY): Payer: Self-pay | Admitting: *Deleted

## 2012-09-01 DIAGNOSIS — R52 Pain, unspecified: Secondary | ICD-10-CM | POA: Insufficient documentation

## 2012-09-01 DIAGNOSIS — Z760 Encounter for issue of repeat prescription: Secondary | ICD-10-CM | POA: Insufficient documentation

## 2012-09-01 DIAGNOSIS — Z91040 Latex allergy status: Secondary | ICD-10-CM | POA: Insufficient documentation

## 2012-09-01 DIAGNOSIS — Z888 Allergy status to other drugs, medicaments and biological substances status: Secondary | ICD-10-CM | POA: Insufficient documentation

## 2012-09-01 DIAGNOSIS — Z885 Allergy status to narcotic agent status: Secondary | ICD-10-CM | POA: Insufficient documentation

## 2012-09-01 DIAGNOSIS — G8929 Other chronic pain: Secondary | ICD-10-CM

## 2012-09-01 DIAGNOSIS — F17200 Nicotine dependence, unspecified, uncomplicated: Secondary | ICD-10-CM | POA: Insufficient documentation

## 2012-09-01 DIAGNOSIS — M545 Low back pain, unspecified: Secondary | ICD-10-CM | POA: Insufficient documentation

## 2012-09-01 MED ORDER — OXYCODONE-ACETAMINOPHEN 5-325 MG PO TABS: 2.0000 | ORAL_TABLET | ORAL | Status: DC | PRN

## 2012-09-01 MED ORDER — OXYCODONE-ACETAMINOPHEN 5-325 MG PO TABS
2.0000 | ORAL_TABLET | Freq: Once | ORAL | Status: AC
  Administered 2012-09-01: 2 via ORAL
  Filled 2012-09-01: qty 2

## 2012-09-01 NOTE — ED Notes (Signed)
Pt states that he hurt his back July 24th had an MRI and was told he had 3 herniated disks, pt was given rx meds but was told to find a pain clinic. Pt has found pain clinic but does not have an appointment until 25th of Sept. Pt needs refill for oxycodone.

## 2012-09-01 NOTE — ED Provider Notes (Signed)
History     CSN: [id number]  Arrival date & time 09/01/12  0702   First MD Initiated Contact with Patient 09/01/12 [phone]      Chief Complaint  Patient presents with  . Back Pain  . Medication Refill    (Consider location/radiation/quality/duration/timing/severity/associated sxs/prior treatment) HPI Johnny [name] is a 41 y.o. male presenting with acute on chronic low back pain that radiates into the leg and buttocks.  His pain is chronic, it has been there for months. Pain is 10/10, sharp, grabbing, not associated with new weakness or loss of sensation of the perineum, no loss of bowel or bladder continence. He has chronic left leg weakness and parasthesias. No new symptoms. He was previously in a pain clinic in the spring and early summer of this year.  Orthopedist ordered an MRI showing some disc bulging and mild stenosis, plans to have surgery for spinal fusion in October and has f/u pain clinic appointment on 9/25.  Orthopedist returns to town on Tuesday - requests pain medicine.  Past Medical History  Diagnosis Date  . Herniated disc   . Chronic back pain   . Chronic neck pain     Past Surgical History  Procedure Date  . Neck fusion   . Knee surgery   . Hernia repair     History reviewed. No pertinent family history.  History  Substance Use Topics  . Smoking status: Current Everyday Smoker  . Smokeless tobacco: Not on file  . Alcohol Use: No      Review of Systems positive for low back pain, chronic leg paresthesias, chronic mild weakness of the left leg; patient denies any fevers or chills, changes in vision, earache, sore throat, neck pain or stiffness, chest pain or pressure, palpitations, syncope, dyspnea, cough, wheezing, abdominal pain, nausea, vomiting, diarrhea, melena, red bloody stools, frequency, dysuria, myalgias, arthralgias, recent trauma, rash, itching, skin lesions, easy bruising or bleeding, headache, seizures.    Allergies  Flexeril; Codeine; Latex;  and Tramadol  Home Medications   Current Outpatient Rx  Name Route Sig Dispense Refill  . METHOCARBAMOL 500 MG PO TABS Oral Take 2 tablets (1,000 mg total) by mouth 4 (four) times daily as needed (muscle spasm/pain). 25 tablet 0  . NAPROXEN 250 MG PO TABS Oral Take 1 tablet (250 mg total) by mouth 2 (two) times daily with a meal. 14 tablet 0  . OXYCODONE-ACETAMINOPHEN 10-325 MG PO TABS Oral Take 1 tablet by mouth every 6 (six) hours as needed for pain. 15 tablet 0  . OXYCODONE-ACETAMINOPHEN 5-325 MG PO TABS Oral Take 2 tablets by mouth every 4 (four) hours as needed for pain. 12 tablet 0    BP 155/90  Pulse 81  Temp 97.9 F (36.6 C) (Oral)  Resp 16  SpO2 98%  Physical Exam VITAL SIGNS:   Filed Vitals:   09/01/12 0707  BP: 155/90  Pulse: 81  Temp: 97.9 F (36.6 C)  Resp: 16   CONSTITUTIONAL: Awake, oriented, appears non-toxic HENT: Atraumatic, normocephalic, oral mucosa pink and moist, airway patent. Nares patent without drainage. External ears normal - pierced. EYES: Conjunctiva clear, EOMI, PERRLA NECK: Trachea midline, non-tender, supple CARDIOVASCULAR: Normal heart rate, Normal rhythm, No murmurs, rubs, gallops PULMONARY/CHEST: Clear to auscultation, no rhonchi, wheezes, or rales. Symmetrical breath sounds. Non-tender. ABDOMINAL: Non-distended, soft, non-tender - no rebound or guarding.  BS normal. BACK: Tenderness in the lower lumbar disc spaces.   NEUROLOGIC: DTR: Brachioradialis, biceps, patellar, Achilles tendon reflexes 2+ bilaterally.  No  clonus. Strength: 5/5 strength flexors and extensors in the lower extremities. Sensation: Sensation intact distally to light touch. Gait and Station: Normal heel/toe.  Walks unassisted with a mildly antalgic gait. EXTREMITIES: No clubbing, cyanosis, or edema SKIN: Warm, Dry, No erythema, No rash.  Multiple tattoos.  ED Course  Procedures (including critical care time)  Labs Reviewed - No data to display No results  found.   1. Acute exacerbation of chronic low back pain     MDM  OBERT ESPINDOLA is a 41 y.o. male acute on chronic and likely exacerbated by moving. Vascularly intact, he's got no current symptoms of acute cord compression syndromes that would necessitate emergency neurosurgery. Patient is sore, however he can walk and function. Patient has an extensive narcotic prescription history and chronic back pain is obtained from [facility] narcotic drug database.  Patient does have disc disease as seen on MRI which is reviewed by me on July 24. Patient's orthopedist returns to town on Tuesday and so I will write a prescription for 2 days until his orthopedist is back to town he can fill up his prescription until the patient sees his pain clinic on the 25th of this month. I had a long discussion with this patient concerning appropriate use of the emergency department - and that it is not our job to be his pain management clinic. Patient understands this, also told him that he should not expect to get pain medicine refills in [place].  It is approprite to treat his pain here and give him a prescription until his physician returns, but as we discussed at length, his behavior, though he has anatomic disease, is concerning for drug seeking behavior.  I explained the diagnosis and have given explicit precautions to return to the ER including new weakness in the lower extremities or loss of bowel or bladder function or any other new or worsening symptoms. The patient understands and accepts the medical plan as it's been dictated and I have answered their questions. Discharge instructions concerning home care and prescriptions have been given.  The patient is STABLE and is discharged to home in good condition.          Jones Skene, MD 09/01/12 [phone]

## 2012-09-06 ENCOUNTER — Encounter (HOSPITAL_COMMUNITY): Payer: Self-pay

## 2012-09-06 ENCOUNTER — Emergency Department (HOSPITAL_COMMUNITY)
Admission: EM | Admit: 2012-09-06 | Discharge: 2012-09-06 | Disposition: A | Discharge status: Home Health | Payer: Medicaid Other | Attending: Emergency Medicine | Admitting: Emergency Medicine

## 2012-09-06 DIAGNOSIS — F17200 Nicotine dependence, unspecified, uncomplicated: Secondary | ICD-10-CM | POA: Insufficient documentation

## 2012-09-06 DIAGNOSIS — Z91040 Latex allergy status: Secondary | ICD-10-CM | POA: Insufficient documentation

## 2012-09-06 DIAGNOSIS — M549 Dorsalgia, unspecified: Secondary | ICD-10-CM | POA: Insufficient documentation

## 2012-09-06 DIAGNOSIS — Z888 Allergy status to other drugs, medicaments and biological substances status: Secondary | ICD-10-CM | POA: Insufficient documentation

## 2012-09-06 DIAGNOSIS — G8929 Other chronic pain: Secondary | ICD-10-CM

## 2012-09-06 MED ORDER — OXYCODONE-ACETAMINOPHEN 5-325 MG PO TABS
2.0000 | ORAL_TABLET | Freq: Once | ORAL | Status: AC
  Administered 2012-09-06: 2 via ORAL
  Filled 2012-09-06: qty 2

## 2012-09-06 NOTE — ED Notes (Signed)
Pt has chronic back pain. Has been accepted to pain clinic, but can not go for a few more weeks. Needs his pain RX filled and his doctors will not fill

## 2012-09-06 NOTE — ED Provider Notes (Signed)
History     CSN: [id number]  Arrival date & time 09/06/12  0841   First MD Initiated Contact with Patient 09/06/12 0845      Chief Complaint  Patient presents with  . Back Pain    (Consider location/radiation/quality/duration/timing/severity/associated sxs/prior treatment) HPI Comments: 41 year old male with chronic back pain presents to the emergency department requesting pain medication for his back. States he has 3 herniated discs which he is seeing neurosurgery and orthopedics for, however he was unable to get an appointment. He has an appointment with the pain clinic next month. He says he called orthopedist on Tuesday who told him to call the neurosurgeon for medications, and states the neurosurgeon told him to call orthopedics. He was unable to obtain any pain medication. When asked about what changed since his visit on September 9, he stated "my pain is worsened". Admits to pain numbness and tingling down his left leg. Denies any loss of control of his bowel or bladder, however he admits to some dribbling. He rates his pain as 9/10, worse with sitting for a while or walking for a while. He ran out of pain medication yesterday, and ibuprofen provides no relief.  Patient is a 41 y.o. male presenting with back pain. The history is provided by the patient.  Back Pain  Associated symptoms include numbness and weakness. Pertinent negatives include no fever.    Past Medical History  Diagnosis Date  . Herniated disc   . Chronic back pain   . Chronic neck pain     Past Surgical History  Procedure Date  . Neck fusion   . Knee surgery   . Hernia repair     No family history on file.  History  Substance Use Topics  . Smoking status: Current Every Day Smoker  . Smokeless tobacco: Not on file  . Alcohol Use: No      Review of Systems  Constitutional: Negative for fever and chills.  HENT: Negative for neck pain and neck stiffness.   Gastrointestinal:       No bowel  dysfunction  Genitourinary:       No urinary dysfunction  Musculoskeletal: Positive for back pain.  Neurological: Positive for weakness and numbness.    Allergies  Flexeril; Codeine; Latex; and Tramadol  Home Medications   Current Outpatient Rx  Name Route Sig Dispense Refill  . METHOCARBAMOL 500 MG PO TABS Oral Take 1,000 mg by mouth 4 (four) times daily as needed. For muscle spasms    . NAPROXEN 250 MG PO TABS Oral Take 1 tablet (250 mg total) by mouth 2 (two) times daily with a meal. 14 tablet 0  . OXYCODONE-ACETAMINOPHEN 10-325 MG PO TABS Oral Take 1 tablet by mouth every 6 (six) hours as needed. For pain    . OXYCODONE-ACETAMINOPHEN 5-325 MG PO TABS Oral Take 2 tablets by mouth every 4 (four) hours as needed. For pain      BP 146/93  Pulse 73  Temp 97.5 F (36.4 C) (Oral)  Resp 20  SpO2 97%  Physical Exam  Nursing note and vitals reviewed. Constitutional: He appears well-developed and well-nourished. No distress.  HENT:  Head: Normocephalic and atraumatic.  Eyes: Conjunctivae normal are normal.  Neck: Normal range of motion. Neck supple.  Cardiovascular: Normal rate, regular rhythm and normal heart sounds.   Pulmonary/Chest: Effort normal and breath sounds normal.  Abdominal: Soft. Bowel sounds are normal. There is no tenderness.  Musculoskeletal:       Thoracic back:  Normal.       Lumbar back: He exhibits decreased range of motion (due to pain) and tenderness (left paraspinal muscles). He exhibits no bony tenderness, no swelling, no edema, no deformity and no spasm.  Neurological: He is alert. A sensory deficit (decreased sensation to light touch geeralized down left leg) is present.       Decreased strength on left- patient reluctant to try due to being "too painful" Positive SLR on left  Skin: Skin is warm and dry.  Psychiatric: He has a normal mood and affect. His speech is normal. He is agitated.    ED Course  Procedures (including critical care time)  Labs  Reviewed - No data to display No results found.   1. Chronic back pain       MDM  41 year old male with chronic back pain presenting for pain medications. I explained he needs to see the orthopedic doctor to discuss pain medication with him. He has received many Percocet prescriptions within the past 2 months, and has picked them up in multiple cities. I am concerned with drug-seeking behavior. I will give him Percocet in the hospital today, however I am not comfortable prescribing pain medication. There are no red flags concerning his low back pain, as his symptoms have not changed tremendously since being seen here on the ninth. He is ambulating without difficulty.        Trevor Mace, PA-C 09/06/12 [phone]

## 2012-09-09 DIAGNOSIS — M549 Dorsalgia, unspecified: Secondary | ICD-10-CM | POA: Insufficient documentation

## 2012-09-09 DIAGNOSIS — G8929 Other chronic pain: Secondary | ICD-10-CM | POA: Insufficient documentation

## 2012-09-11 NOTE — ED Provider Notes (Signed)
Medical screening examination/treatment/procedure(s) were performed by non-physician practitioner and as supervising physician I was immediately available for consultation/collaboration.   [name], DO 09/11/12 1231 

## 2012-09-20 DIAGNOSIS — S68119A Complete traumatic metacarpophalangeal amputation of unspecified finger, initial encounter: Secondary | ICD-10-CM | POA: Insufficient documentation

## 2012-09-23 DIAGNOSIS — G8911 Acute pain due to trauma: Secondary | ICD-10-CM | POA: Insufficient documentation

## 2012-09-28 ENCOUNTER — Emergency Department: Payer: Self-pay | Admitting: Emergency Medicine

## 2012-11-29 DIAGNOSIS — M792 Neuralgia and neuritis, unspecified: Secondary | ICD-10-CM | POA: Insufficient documentation

## 2012-12-20 ENCOUNTER — Emergency Department: Payer: Self-pay | Admitting: Emergency Medicine

## 2012-12-25 ENCOUNTER — Emergency Department: Payer: Self-pay | Admitting: Emergency Medicine

## 2013-01-05 ENCOUNTER — Emergency Department (HOSPITAL_COMMUNITY)
Admission: EM | Admit: 2013-01-05 | Discharge: 2013-01-05 | Disposition: A | Discharge status: Home / Self Care | Payer: Self-pay | Attending: Emergency Medicine | Admitting: Emergency Medicine

## 2013-01-05 ENCOUNTER — Encounter (HOSPITAL_COMMUNITY): Payer: Self-pay | Admitting: Emergency Medicine

## 2013-01-05 ENCOUNTER — Emergency Department (HOSPITAL_COMMUNITY): Payer: Self-pay

## 2013-01-05 DIAGNOSIS — Z9889 Other specified postprocedural states: Secondary | ICD-10-CM | POA: Insufficient documentation

## 2013-01-05 DIAGNOSIS — W208XXA Other cause of strike by thrown, projected or falling object, initial encounter: Secondary | ICD-10-CM | POA: Insufficient documentation

## 2013-01-05 DIAGNOSIS — Z981 Arthrodesis status: Secondary | ICD-10-CM | POA: Insufficient documentation

## 2013-01-05 DIAGNOSIS — Y929 Unspecified place or not applicable: Secondary | ICD-10-CM | POA: Insufficient documentation

## 2013-01-05 DIAGNOSIS — Z8739 Personal history of other diseases of the musculoskeletal system and connective tissue: Secondary | ICD-10-CM | POA: Insufficient documentation

## 2013-01-05 DIAGNOSIS — Y9389 Activity, other specified: Secondary | ICD-10-CM | POA: Insufficient documentation

## 2013-01-05 DIAGNOSIS — F17200 Nicotine dependence, unspecified, uncomplicated: Secondary | ICD-10-CM | POA: Insufficient documentation

## 2013-01-05 DIAGNOSIS — S93409A Sprain of unspecified ligament of unspecified ankle, initial encounter: Secondary | ICD-10-CM | POA: Insufficient documentation

## 2013-01-05 DIAGNOSIS — M542 Cervicalgia: Secondary | ICD-10-CM | POA: Insufficient documentation

## 2013-01-05 DIAGNOSIS — M549 Dorsalgia, unspecified: Secondary | ICD-10-CM | POA: Insufficient documentation

## 2013-01-05 DIAGNOSIS — G8929 Other chronic pain: Secondary | ICD-10-CM | POA: Insufficient documentation

## 2013-01-05 MED ORDER — OXYCODONE-ACETAMINOPHEN 5-325 MG PO TABS
2.0000 | ORAL_TABLET | Freq: Once | ORAL | Status: AC
  Administered 2013-01-05: 2 via ORAL
  Filled 2013-01-05: qty 2

## 2013-01-05 MED ORDER — OXYCODONE-ACETAMINOPHEN 5-325 MG PO TABS: ORAL_TABLET | ORAL | Status: DC

## 2013-01-05 NOTE — ED Provider Notes (Signed)
History   This chart was scribed for Wynetta Emery, PA by Leone Payor, ED Scribe. This patient was seen in room TR09C/TR09C and the patient's care was started at 1816.   CSN: [id number]  Arrival date & time 01/05/13  1612   First MD Initiated Contact with Patient 01/05/13 1816      Chief Complaint  Patient presents with  . Foot Pain     The history is provided by the patient. No language interpreter was used.    Johnny [name] is a 42 y.o. male who presents to the Emergency Department complaining of left foot pain starting 1 day ago after a tire jack fell out and hit his foot near the left inner ankle. Pt reports taking tylenol and ibuprofen with no relief. Pt denies fever, headache, back pain, neck pain.   Pt is a current everyday smoker but denies alcohol use.  Past Medical History  Diagnosis Date  . Herniated disc   . Chronic back pain   . Chronic neck pain     Past Surgical History  Procedure Date  . Neck fusion   . Knee surgery   . Hernia repair     No family history on file.  History  Substance Use Topics  . Smoking status: Current Every Day Smoker  . Smokeless tobacco: Not on file  . Alcohol Use: No      Review of Systems  Constitutional: Negative for fever.  HENT: Negative for neck pain.   Respiratory: Negative for shortness of breath.   Cardiovascular: Negative for chest pain.  Gastrointestinal: Negative for nausea, vomiting, abdominal pain and diarrhea.  Musculoskeletal: Positive for arthralgias. Negative for back pain.  Neurological: Negative for headaches.  All other systems reviewed and are negative.    Allergies  Flexeril; Codeine; Latex; and Tramadol  Home Medications   Current Outpatient Rx  Name  Route  Sig  Dispense  Refill  . IBUPROFEN 200 MG PO TABS   Oral   Take 400 mg by mouth every 6 (six) hours as needed. For pain         . NAPROXEN 250 MG PO TABS   Oral   Take 1 tablet (250 mg total) by mouth 2 (two) times daily with a  meal.   14 tablet   0     BP 133/99  Pulse 98  Temp 98.6 F (37 C) (Oral)  Resp 18  SpO2 96%  Physical Exam  Nursing note and vitals reviewed. Constitutional: He is oriented to person, place, and time. He appears well-developed and well-nourished. No distress.  HENT:  Head: Normocephalic.  Eyes: Conjunctivae normal and EOM are normal.  Cardiovascular: Normal rate.        DP pulse 2+ bilaterally.   Pulmonary/Chest: Effort normal and breath sounds normal. No stridor.  Musculoskeletal: Normal range of motion.       Significant soft tissue swelling and erythema to left foot medial arch.   Neurological: He is alert and oriented to person, place, and time.       Distal sensation grossly intact.   Psychiatric: He has a normal mood and affect.    ED Course  Procedures (including critical care time)  DIAGNOSTIC STUDIES: Oxygen Saturation is 96% on room air, adequate by my interpretation.    COORDINATION OF CARE:   6:51 PM Discussed treatment plan which includes elevation of affected leg and application of ACE wrap and crutches with pt at bedside and pt agreed to plan.  Labs Reviewed - No data to display Dg Foot Complete Left  01/05/2013  *RADIOLOGY REPORT*  Clinical Data: Medial left foot pain and swelling secondary to blunt trauma.  LEFT FOOT - COMPLETE 3+ VIEW  Comparison: 11/06/2008  Findings: There is no fracture or dislocation or arthritis or other osseous abnormality.  Soft tissue swelling is present over the medial aspect of the midfoot.  IMPRESSION: Soft tissue swelling.  Otherwise negative.   Original Report Authenticated By: Francene Boyers, M.D.      1. Ankle sprain       MDM    Pt verbalized understanding and agrees with care plan. Outpatient follow-up and return precautions given.    New Prescriptions   OXYCODONE-ACETAMINOPHEN (PERCOCET/ROXICET) 5-325 MG PER TABLET    1 to 2 tabs PO q6hrs  PRN for pain    I personally performed the services described in  this documentation, which was scribed in my presence. The recorded information has been reviewed and is accurate.   Wynetta Emery, PA-C 01/08/13 [phone]

## 2013-01-05 NOTE — Progress Notes (Signed)
Orthopedic Tech Progress Note Patient Details:  Johnny [name] [birth year] [id number]  Ortho Devices Type of Ortho Device: Ace wrap;Crutches;Postop shoe/boot Ortho Device/Splint Location: left LE Ortho Device/Splint Interventions: Application Pt expressed he is comfortable using crutches. Pt stood up so I could measure him.Did not walk with crutches.   Leo Grosser T 01/05/2013, 7:53 PM

## 2013-01-05 NOTE — ED Notes (Signed)
Pt. Stated, i was changing a tire and the jack fell out and hit my foot . It hurts really bad . Lt. Foot pain.

## 2013-01-09 NOTE — ED Provider Notes (Signed)
Medical screening examination/treatment/procedure(s) were performed by non-physician practitioner and as supervising physician I was immediately available for consultation/collaboration.   Adrea Sherpa E Rigley Niess, MD 01/09/13 0841 

## 2013-01-17 DIAGNOSIS — M25569 Pain in unspecified knee: Secondary | ICD-10-CM | POA: Insufficient documentation

## 2013-01-17 DIAGNOSIS — Z96659 Presence of unspecified artificial knee joint: Secondary | ICD-10-CM | POA: Insufficient documentation

## 2013-01-21 ENCOUNTER — Emergency Department: Payer: Self-pay | Admitting: Emergency Medicine

## 2013-02-07 ENCOUNTER — Emergency Department: Payer: Self-pay | Admitting: Emergency Medicine

## 2013-02-13 ENCOUNTER — Emergency Department: Payer: Self-pay | Admitting: Emergency Medicine

## 2013-02-14 ENCOUNTER — Emergency Department: Payer: Self-pay | Admitting: Emergency Medicine

## 2013-02-14 ENCOUNTER — Emergency Department (HOSPITAL_COMMUNITY)
Admission: EM | Admit: 2013-02-14 | Discharge: 2013-02-14 | Disposition: A | Discharge status: Home / Self Care | Payer: Self-pay | Attending: Emergency Medicine | Admitting: Emergency Medicine

## 2013-02-14 ENCOUNTER — Encounter (HOSPITAL_COMMUNITY): Payer: Self-pay | Admitting: Emergency Medicine

## 2013-02-14 DIAGNOSIS — H60399 Other infective otitis externa, unspecified ear: Secondary | ICD-10-CM | POA: Insufficient documentation

## 2013-02-14 DIAGNOSIS — R51 Headache: Secondary | ICD-10-CM | POA: Insufficient documentation

## 2013-02-14 DIAGNOSIS — Z8739 Personal history of other diseases of the musculoskeletal system and connective tissue: Secondary | ICD-10-CM | POA: Insufficient documentation

## 2013-02-14 DIAGNOSIS — F17200 Nicotine dependence, unspecified, uncomplicated: Secondary | ICD-10-CM | POA: Insufficient documentation

## 2013-02-14 DIAGNOSIS — H6091 Unspecified otitis externa, right ear: Secondary | ICD-10-CM

## 2013-02-14 LAB — CBC WITH DIFFERENTIAL/PLATELET
Basophil #: 0 10*3/uL (ref 0.0–0.1)
Basophil %: 0.4 %
Eosinophil #: 0 10*3/uL (ref 0.0–0.7)
Eosinophil %: 0.6 %
HCT: 42.9 % (ref 40.0–52.0)
HGB: 14.8 g/dL (ref 13.0–18.0)
Lymphocyte #: 2.2 10*3/uL (ref 1.0–3.6)
Lymphocyte %: 29.6 %
MCH: 31.2 pg (ref 26.0–34.0)
MCHC: 34.4 g/dL (ref 32.0–36.0)
MCV: 91 fL (ref 80–100)
Monocyte #: 0.4 x10 3/mm (ref 0.2–1.0)
Monocyte %: 5 %
Neutrophil #: 4.8 10*3/uL (ref 1.4–6.5)
Neutrophil %: 64.4 %
Platelet: 290 10*3/uL (ref 150–440)
RBC: 4.72 10*6/uL (ref 4.40–5.90)
RDW: 14.4 % (ref 11.5–14.5)
WBC: 7.4 10*3/uL (ref 3.8–10.6)

## 2013-02-14 MED ORDER — CIPROFLOXACIN-DEXAMETHASONE 0.3-0.1 % OT SUSP: 4.0000 [drp] | Freq: Two times a day (BID) | OTIC | Status: AC

## 2013-02-14 MED ORDER — ANTIPYRINE-BENZOCAINE 5.4-1.4 % OT SOLN
3.0000 [drp] | OTIC | Status: DC | PRN
  Administered 2013-02-14: 3 [drp] via OTIC
  Filled 2013-02-14: qty 10

## 2013-02-14 NOTE — ED Notes (Signed)
Patient advised he "stuck a q-tip in my ear and jammed it in pretty hard".  Patient advised that it did bleed and now "my ear pops every time I open my mouth".

## 2013-02-14 NOTE — ED Provider Notes (Signed)
History     CSN: [id number]  Arrival date & time 02/14/13  0719   First MD Initiated Contact with Patient 02/14/13 [phone]      No chief complaint on file.   (Consider location/radiation/quality/duration/timing/severity/associated sxs/prior treatment) HPI  42 year old male with history of chronic back pain, chronic back pain presents complaining of ear pain.  Pt reports 4 days ago while using a Q-tip he accidentally jammed it in his R ear hard.  C/o acute onset of R ear pain.  Describe as sharp and throbbing sensation, progressively worse, radiates to his R face with ringing in ear but no loss of hearing.  He also notice trace of blood at the tip of the Q-tip but has not seen any ear discharge since.  Reports of ear "pops everytime i open my mouth".  Denies fever, sneeze, cough, sore throat, neck pain, or rash.  No recent swimming in lake/pond/pool.  Has been taking tylenol with minimal relief.  No recent medication changes.  Does use q-tip on a regular basis  Past Medical History  Diagnosis Date  . Herniated disc   . Chronic back pain   . Chronic neck pain     Past Surgical History  Procedure Laterality Date  . Neck fusion    . Knee surgery    . Hernia repair      No family history on file.  History  Substance Use Topics  . Smoking status: Current Every Day Smoker  . Smokeless tobacco: Not on file  . Alcohol Use: No      Review of Systems  Constitutional: Negative for fever and chills.  HENT: Positive for ear pain. Negative for hearing loss, sore throat, neck pain and ear discharge.   Skin: Negative for rash.  Neurological: Positive for headaches. Negative for numbness.    Allergies  Flexeril; Codeine; Latex; and Tramadol  Home Medications   Current Outpatient Rx  Name  Route  Sig  Dispense  Refill  . oxyCODONE-acetaminophen (PERCOCET/ROXICET) 5-325 MG per tablet      1 to 2 tabs PO q6hrs  PRN for pain   15 tablet   0     There were no vitals taken for  this visit.  Physical Exam  Nursing note and vitals reviewed. Constitutional: He appears well-developed and well-nourished. No distress.  HENT:  Head: Normocephalic and atraumatic.  L ear: normal external appearance, TM intact, no redness or effusion  R ear: normal external appearance however tenderness to helix/antihelix and tragus on palpation, canal mildly erythematous without exudates or edema.  Intact TM without effusion or dullness  No lymphadenopathy  Eyes: Conjunctivae are normal.  Neck: Normal range of motion. Neck supple.  Lymphadenopathy:    He has no cervical adenopathy.  Neurological: He is alert.  Skin: No rash noted.  Psychiatric: He has a normal mood and affect.    ED Course  Procedures (including critical care time)  Labs Reviewed - No data to display No results found.   No diagnosis found.  7:38 AM Pt was seen and evaluated by me for R ear pain after injurying it with a Qtip 4 days ago.  TM appears intact but external ear is tender on exam.  Plan to treat for suspect otitis externa as pt may have injured ear canal.  No sxs concerning for acoustic neuritis, mastoiditis, or labirinthitis.  Alraugan ear drop and ciprodex prescribed.  ENT referral as needed.  Recommend not using Q-tip.  Pt voice understanding and agrees with  plan.    BP 154/88  Pulse 75  Temp(Src) 98 F (36.7 C) (Oral)  Resp 20  SpO2 99%  1. Otitis externa  MDM          Fayrene Helper, PA-C 02/14/13 [phone]

## 2013-02-14 NOTE — ED Provider Notes (Signed)
Medical screening examination/treatment/procedure(s) were performed by non-physician practitioner and as supervising physician I was immediately available for consultation/collaboration.   Lyanne Co, MD 02/14/13 [phone]

## 2013-03-15 ENCOUNTER — Emergency Department: Payer: Self-pay | Admitting: Emergency Medicine

## 2013-06-18 ENCOUNTER — Emergency Department: Payer: Self-pay | Admitting: Emergency Medicine

## 2013-07-02 DIAGNOSIS — M25549 Pain in joints of unspecified hand: Secondary | ICD-10-CM | POA: Insufficient documentation

## 2013-07-02 HISTORY — DX: Pain in joints of unspecified hand: M25.549

## 2013-08-17 ENCOUNTER — Ambulatory Visit: Payer: Self-pay | Admitting: Orthopedic Surgery

## 2013-08-17 ENCOUNTER — Emergency Department: Payer: Self-pay | Admitting: Emergency Medicine

## 2013-08-20 DIAGNOSIS — M7989 Other specified soft tissue disorders: Secondary | ICD-10-CM | POA: Insufficient documentation

## 2013-09-02 ENCOUNTER — Emergency Department: Payer: Self-pay | Admitting: Emergency Medicine

## 2013-09-05 ENCOUNTER — Emergency Department: Payer: Self-pay | Admitting: Emergency Medicine

## 2013-10-03 ENCOUNTER — Emergency Department: Payer: Self-pay | Admitting: Internal Medicine

## 2013-10-06 ENCOUNTER — Emergency Department: Payer: Self-pay | Admitting: Emergency Medicine

## 2013-10-06 LAB — BASIC METABOLIC PANEL
Anion Gap: 6 — ABNORMAL LOW (ref 7–16)
BUN: 8 mg/dL (ref 7–18)
Calcium, Total: 8.9 mg/dL (ref 8.5–10.1)
Chloride: 106 mmol/L (ref 98–107)
Co2: 27 mmol/L (ref 21–32)
Creatinine: 0.8 mg/dL (ref 0.60–1.30)
EGFR (African American): >60
EGFR (Non-African Amer.): >60
Glucose: 94 mg/dL (ref 65–99)
Osmolality: 276 (ref 275–301)
Potassium: 3.8 mmol/L (ref 3.5–5.1)
Sodium: 139 mmol/L (ref 136–145)

## 2013-10-06 LAB — CBC WITH DIFFERENTIAL/PLATELET
Basophil #: 0 10*3/uL (ref 0.0–0.1)
Basophil %: 0.5 %
Eosinophil #: 0.1 10*3/uL (ref 0.0–0.7)
Eosinophil %: 0.9 %
HCT: 47.1 % (ref 40.0–52.0)
HGB: 16.5 g/dL (ref 13.0–18.0)
Lymphocyte #: 2.5 10*3/uL (ref 1.0–3.6)
Lymphocyte %: 31.8 %
MCH: 31.3 pg (ref 26.0–34.0)
MCHC: 35.1 g/dL (ref 32.0–36.0)
MCV: 89 fL (ref 80–100)
Monocyte #: 0.5 x10 3/mm (ref 0.2–1.0)
Monocyte %: 6 %
Neutrophil #: 4.9 10*3/uL (ref 1.4–6.5)
Neutrophil %: 60.8 %
Platelet: 231 10*3/uL (ref 150–440)
RBC: 5.29 10*6/uL (ref 4.40–5.90)
RDW: 13.7 % (ref 11.5–14.5)
WBC: 8 10*3/uL (ref 3.8–10.6)

## 2013-10-06 LAB — SEDIMENTATION RATE: Erythrocyte Sed Rate: 1 mm/hr (ref 0–15)

## 2013-10-11 ENCOUNTER — Emergency Department: Payer: Self-pay | Admitting: Internal Medicine

## 2013-10-11 LAB — CBC WITH DIFFERENTIAL/PLATELET
Basophil #: 0 10*3/uL (ref 0.0–0.1)
Basophil %: 0.4 %
Eosinophil #: 0.2 10*3/uL (ref 0.0–0.7)
Eosinophil %: 1.8 %
HCT: 43.6 % (ref 40.0–52.0)
HGB: 15.2 g/dL (ref 13.0–18.0)
Lymphocyte #: 3.3 10*3/uL (ref 1.0–3.6)
Lymphocyte %: 38 %
MCH: 31.1 pg (ref 26.0–34.0)
MCHC: 34.9 g/dL (ref 32.0–36.0)
MCV: 89 fL (ref 80–100)
Monocyte #: 0.6 x10 3/mm (ref 0.2–1.0)
Monocyte %: 6.8 %
Neutrophil #: 4.6 10*3/uL (ref 1.4–6.5)
Neutrophil %: 53 %
Platelet: 197 10*3/uL (ref 150–440)
RBC: 4.89 10*6/uL (ref 4.40–5.90)
RDW: 13.9 % (ref 11.5–14.5)
WBC: 8.6 10*3/uL (ref 3.8–10.6)

## 2013-10-11 LAB — CULTURE, BLOOD (SINGLE)

## 2013-10-17 ENCOUNTER — Emergency Department: Payer: Self-pay | Admitting: Emergency Medicine

## 2013-10-20 ENCOUNTER — Emergency Department: Payer: Self-pay | Admitting: Emergency Medicine

## 2013-10-20 LAB — CBC WITH DIFFERENTIAL/PLATELET
Basophil #: 0.1 10*3/uL (ref 0.0–0.1)
Basophil %: 0.8 %
Eosinophil #: 0 10*3/uL (ref 0.0–0.7)
Eosinophil %: 0.6 %
HCT: 47.3 % (ref 40.0–52.0)
HGB: 16.4 g/dL (ref 13.0–18.0)
Lymphocyte #: 2.1 10*3/uL (ref 1.0–3.6)
Lymphocyte %: 28.8 %
MCH: 30.7 pg (ref 26.0–34.0)
MCHC: 34.6 g/dL (ref 32.0–36.0)
MCV: 89 fL (ref 80–100)
Monocyte #: 0.5 x10 3/mm (ref 0.2–1.0)
Monocyte %: 7.2 %
Neutrophil #: 4.6 10*3/uL (ref 1.4–6.5)
Neutrophil %: 62.6 %
Platelet: 210 10*3/uL (ref 150–440)
RBC: 5.33 10*6/uL (ref 4.40–5.90)
RDW: 13.6 % (ref 11.5–14.5)
WBC: 7.3 10*3/uL (ref 3.8–10.6)

## 2013-12-21 ENCOUNTER — Emergency Department: Payer: Self-pay | Admitting: Emergency Medicine

## 2014-04-07 ENCOUNTER — Emergency Department: Payer: Self-pay | Admitting: Emergency Medicine

## 2014-06-16 ENCOUNTER — Emergency Department: Payer: Self-pay | Admitting: Emergency Medicine

## 2014-06-16 LAB — CBC
HCT: 46.8 % (ref 40.0–52.0)
HGB: 15.4 g/dL (ref 13.0–18.0)
MCH: 30.4 pg (ref 26.0–34.0)
MCHC: 32.9 g/dL (ref 32.0–36.0)
MCV: 93 fL (ref 80–100)
Platelet: 206 10*3/uL (ref 150–440)
RBC: 5.05 10*6/uL (ref 4.40–5.90)
RDW: 13 % (ref 11.5–14.5)
WBC: 6.7 10*3/uL (ref 3.8–10.6)

## 2014-06-16 LAB — COMPREHENSIVE METABOLIC PANEL
Albumin: 3.8 g/dL (ref 3.4–5.0)
Alkaline Phosphatase: 88 U/L
Anion Gap: 8 (ref 7–16)
BUN: 8 mg/dL (ref 7–18)
Bilirubin,Total: 0.6 mg/dL (ref 0.2–1.0)
Calcium, Total: 8.5 mg/dL (ref 8.5–10.1)
Chloride: 108 mmol/L — ABNORMAL HIGH (ref 98–107)
Co2: 24 mmol/L (ref 21–32)
Creatinine: 0.87 mg/dL (ref 0.60–1.30)
EGFR (African American): >60
EGFR (Non-African Amer.): >60
Glucose: 119 mg/dL — ABNORMAL HIGH (ref 65–99)
Osmolality: 279 (ref 275–301)
Potassium: 3.7 mmol/L (ref 3.5–5.1)
SGOT(AST): 16 U/L (ref 15–37)
SGPT (ALT): 18 U/L (ref 12–78)
Sodium: 140 mmol/L (ref 136–145)
Total Protein: 6.7 g/dL (ref 6.4–8.2)

## 2014-08-24 ENCOUNTER — Emergency Department: Payer: Self-pay | Admitting: Emergency Medicine

## 2014-10-27 DIAGNOSIS — Z9114 Patient's other noncompliance with medication regimen: Secondary | ICD-10-CM | POA: Insufficient documentation

## 2014-10-27 DIAGNOSIS — Z91148 Patient's other noncompliance with medication regimen for other reason: Secondary | ICD-10-CM | POA: Insufficient documentation

## 2014-12-16 ENCOUNTER — Emergency Department: Payer: Self-pay | Admitting: Emergency Medicine

## 2014-12-17 ENCOUNTER — Emergency Department: Payer: Self-pay | Admitting: Emergency Medicine

## 2015-03-28 ENCOUNTER — Emergency Department
Admit: 2015-03-28 | Disposition: A | Discharge status: Home / Self Care | Payer: Self-pay | Admitting: Physician Assistant

## 2015-04-01 ENCOUNTER — Emergency Department: Admit: 2015-04-01 | Discharge status: Against Medical Advice | Payer: Self-pay | Admitting: Emergency Medicine
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
## 2015-08-30 ENCOUNTER — Emergency Department: Payer: No Typology Code available for payment source

## 2015-08-30 ENCOUNTER — Encounter: Payer: Self-pay | Admitting: Emergency Medicine

## 2015-08-30 ENCOUNTER — Emergency Department
Admission: EM | Admit: 2015-08-30 | Discharge: 2015-08-30 | Disposition: A | Discharge status: Home / Self Care | Payer: No Typology Code available for payment source | Attending: Emergency Medicine | Admitting: Emergency Medicine

## 2015-08-30 DIAGNOSIS — Y92410 Unspecified street and highway as the place of occurrence of the external cause: Secondary | ICD-10-CM | POA: Diagnosis not present

## 2015-08-30 DIAGNOSIS — S8001XA Contusion of right knee, initial encounter: Secondary | ICD-10-CM | POA: Insufficient documentation

## 2015-08-30 DIAGNOSIS — Y9389 Activity, other specified: Secondary | ICD-10-CM | POA: Insufficient documentation

## 2015-08-30 DIAGNOSIS — Z720 Tobacco use: Secondary | ICD-10-CM | POA: Diagnosis not present

## 2015-08-30 DIAGNOSIS — S134XXA Sprain of ligaments of cervical spine, initial encounter: Secondary | ICD-10-CM | POA: Diagnosis not present

## 2015-08-30 DIAGNOSIS — Z91040 Latex allergy status: Secondary | ICD-10-CM | POA: Diagnosis not present

## 2015-08-30 DIAGNOSIS — Y998 Other external cause status: Secondary | ICD-10-CM | POA: Diagnosis not present

## 2015-08-30 DIAGNOSIS — S8991XA Unspecified injury of right lower leg, initial encounter: Secondary | ICD-10-CM | POA: Diagnosis present

## 2015-08-30 MED ORDER — NABUMETONE 750 MG PO TABS: 750.0000 mg | ORAL_TABLET | Freq: Two times a day (BID) | ORAL | Status: DC

## 2015-08-30 MED ORDER — ORPHENADRINE CITRATE ER 100 MG PO TB12: 100.0000 mg | ORAL_TABLET | Freq: Two times a day (BID) | ORAL | Status: DC | PRN
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
## 2016-04-17 ENCOUNTER — Emergency Department
Admission: EM | Admit: 2016-04-17 | Discharge: 2016-04-17 | Disposition: A | Discharge status: Home / Self Care | Payer: No Typology Code available for payment source | Attending: Emergency Medicine | Admitting: Emergency Medicine

## 2016-04-17 ENCOUNTER — Encounter: Payer: Self-pay | Admitting: Emergency Medicine

## 2016-04-17 DIAGNOSIS — Z8739 Personal history of other diseases of the musculoskeletal system and connective tissue: Secondary | ICD-10-CM | POA: Insufficient documentation

## 2016-04-17 DIAGNOSIS — Z791 Long term (current) use of non-steroidal anti-inflammatories (NSAID): Secondary | ICD-10-CM | POA: Insufficient documentation

## 2016-04-17 DIAGNOSIS — F17210 Nicotine dependence, cigarettes, uncomplicated: Secondary | ICD-10-CM | POA: Insufficient documentation

## 2016-04-17 DIAGNOSIS — M5432 Sciatica, left side: Secondary | ICD-10-CM

## 2016-04-17 MED ORDER — TIZANIDINE HCL 4 MG PO TABS
4.0000 mg | ORAL_TABLET | Freq: Once | ORAL | Status: AC
  Administered 2016-04-17: 4 mg via ORAL
  Filled 2016-04-17: qty 1

## 2016-04-17 MED ORDER — GABAPENTIN 300 MG PO CAPS: 300.0000 mg | ORAL_CAPSULE | Freq: Every day | ORAL | Status: DC

## 2016-04-17 MED ORDER — OXYCODONE-ACETAMINOPHEN 5-325 MG PO TABS
1.0000 | ORAL_TABLET | Freq: Once | ORAL | Status: AC
  Administered 2016-04-17: 1 via ORAL
  Filled 2016-04-17: qty 1

## 2016-04-17 MED ORDER — TIZANIDINE HCL 4 MG PO TABS: 4.0000 mg | ORAL_TABLET | Freq: Three times a day (TID) | ORAL | Status: DC
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
## 2016-04-17 NOTE — Discharge Instructions (Signed)
Radicular Pain Radicular pain in either the arm or leg is usually from a bulging or herniated disk in the spine. A piece of the herniated disk may press against the nerves as the nerves exit the spine. This causes pain which is felt at the tips of the nerves down the arm or leg. Other causes of radicular pain may include:  Fractures.  Heart disease.  Cancer.  An abnormal and usually degenerative state of the nervous system or nerves (neuropathy). Diagnosis may require CT or MRI scanning to determine the primary cause.  Nerves that start at the neck (nerve roots) may cause radicular pain in the outer shoulder and arm. It can spread down to the thumb and fingers. The symptoms vary depending on which nerve root has been affected. In most cases radicular pain improves with conservative treatment. Neck problems may require physical therapy, a neck collar, or cervical traction. Treatment may take many weeks, and surgery may be considered if the symptoms do not improve.  Conservative treatment is also recommended for sciatica. Sciatica causes pain to radiate from the lower back or buttock area down the leg into the foot. Often there is a history of back problems. Most patients with sciatica are better after 2 to 4 weeks of rest and other supportive care. Short term bed rest can reduce the disk pressure considerably. Sitting, however, is not a good position since this increases the pressure on the disk. You should avoid bending, lifting, and all other activities which make the problem worse. Traction can be used in severe cases. Surgery is usually reserved for patients who do not improve within the first months of treatment. Only take over-the-counter or prescription medicines for pain, discomfort, or fever as directed by your caregiver. Narcotics and muscle relaxants may help by relieving more severe pain and spasm and by providing mild sedation. Cold or massage can give significant relief. Spinal manipulation  is not recommended. It can increase the degree of disc protrusion. Epidural steroid injections are often effective treatment for radicular pain. These injections deliver medicine to the spinal nerve in the space between the protective covering of the spinal cord and back bones (vertebrae). Your caregiver can give you more information about steroid injections. These injections are most effective when given within two weeks of the onset of pain.  You should see your caregiver for follow up care as recommended. A program for neck and back injury rehabilitation with stretching and strengthening exercises is an important part of management.  SEEK IMMEDIATE MEDICAL CARE IF:  You develop increased pain, weakness, or numbness in your arm or leg.  You develop difficulty with bladder or bowel control.  You develop abdominal pain.   This information is not intended to replace advice given to you by your health care provider. Make sure you discuss any questions you have with your health care provider.   Document Released: 01/18/2005 Document Revised: 01/01/2015 Document Reviewed: 07/07/2015 Elsevier Interactive Patient Education [employer].  Please return to the emergency department for any weakness, loss of bowel or bladder symptoms.

## 2016-04-17 NOTE — ED Notes (Signed)
States he slipped about 3 weeks ago  Having pain to neck and tailbone.  States he can only lie on right side  Has been taking OTC tylenol and ibu w/o relief

## 2016-12-17 ENCOUNTER — Emergency Department
Admission: EM | Admit: 2016-12-17 | Discharge: 2016-12-17 | Disposition: A | Discharge status: Home / Self Care | Payer: Self-pay | Attending: Emergency Medicine | Admitting: Emergency Medicine

## 2016-12-17 ENCOUNTER — Emergency Department: Payer: Self-pay

## 2016-12-17 DIAGNOSIS — Y999 Unspecified external cause status: Secondary | ICD-10-CM | POA: Insufficient documentation

## 2016-12-17 DIAGNOSIS — Y929 Unspecified place or not applicable: Secondary | ICD-10-CM | POA: Insufficient documentation

## 2016-12-17 DIAGNOSIS — F17210 Nicotine dependence, cigarettes, uncomplicated: Secondary | ICD-10-CM | POA: Insufficient documentation

## 2016-12-17 DIAGNOSIS — W1812XA Fall from or off toilet with subsequent striking against object, initial encounter: Secondary | ICD-10-CM | POA: Insufficient documentation

## 2016-12-17 DIAGNOSIS — Y939 Activity, unspecified: Secondary | ICD-10-CM | POA: Insufficient documentation

## 2016-12-17 DIAGNOSIS — S0033XA Contusion of nose, initial encounter: Secondary | ICD-10-CM

## 2016-12-17 MED ORDER — OXYCODONE-ACETAMINOPHEN 5-325 MG PO TABS: 1.0000 | ORAL_TABLET | Freq: Four times a day (QID) | ORAL | 0 refills | Status: DC | PRN

## 2016-12-17 MED ORDER — NAPROXEN 500 MG PO TABS: 500.0000 mg | ORAL_TABLET | Freq: Two times a day (BID) | ORAL | 0 refills | Status: DC

## 2016-12-17 NOTE — ED Triage Notes (Addendum)
Pt came to ED via pov c/o nose pain. Pt reports has sleeping problems and dozed off while sitting on toilet. Pt hit nose on sink and c/o pain 8/10. Has appointment to see doctor about sleeping issues next month.

## 2016-12-17 NOTE — ED Notes (Signed)
Pt verbalized understanding of discharge instructions. NAD at this time. 

## 2016-12-17 NOTE — ED Notes (Signed)
PA Caryl Pina at bedside

## 2016-12-17 NOTE — ED Provider Notes (Signed)
[facility] Emergency Department Provider Note  ____________________________________________  Time seen: Approximately 8:52 AM  I have reviewed the triage vital signs and the nursing notes.   HISTORY  Chief Complaint Facial Injury    HPI Johnny Ising. is a 45 y.o. male with no significant PMH that presents to the emergency department after falling off a toilet last night and hitting head. Patient states he goes days without sleeping, which makes him very tired and prone to falling asleep randomly. Patient states that he has not been asleep for days and last night before bed it caused him to doze off on the toilet. Patient went to bed last night and woke up with severe nose pain so he decided to come to the emergency room. Patient states that he has pain over the nasal bridge and under left eye.  Patient has never passed out before. Patient has no cardiac history. Patient denies headache, visual changes, shortness of breath, chest pain, palpitations, nausea, vomiting, abdominal pain. Patient to the doctor next month about his sleeping issues.     Past Medical History:  Diagnosis Date  . Chronic back pain   . Chronic neck pain   . Herniated disc     There are no active problems to display for this patient.   Past Surgical History:  Procedure Laterality Date  . HERNIA REPAIR    . KNEE SURGERY    . neck fusion      Prior to Admission medications   Medication Sig Start Date End Date Taking? Authorizing Provider  gabapentin (NEURONTIN) 300 MG capsule Take 1 capsule (300 mg total) by mouth at bedtime. 04/17/16 04/17/17  Duanne Guess, PA-C  nabumetone (RELAFEN) 750 MG tablet Take 1 tablet (750 mg total) by mouth 2 (two) times daily. 08/30/15   Jenise V Bacon Menshew, PA-C  naproxen (NAPROSYN) 500 MG tablet Take 1 tablet (500 mg total) by mouth 2 (two) times daily with a meal. 12/17/16 12/17/17  Laban Emperor, PA-C  orphenadrine (NORFLEX) 100 MG tablet Take  1 tablet (100 mg total) by mouth 2 (two) times daily as needed for muscle spasms. 08/30/15   Jenise V Bacon Menshew, PA-C  oxyCODONE-acetaminophen (ROXICET) 5-325 MG tablet Take 1 tablet by mouth every 6 (six) hours as needed for severe pain. 12/17/16   Laban Emperor, PA-C  tiZANidine (ZANAFLEX) 4 MG tablet Take 1 tablet (4 mg total) by mouth 3 (three) times daily. 04/17/16 04/17/17  Duanne Guess, PA-C    Allergies Flexeril [cyclobenzaprine]; Codeine; Latex; and Tramadol  No family history on file.  Social History Social History  Substance Use Topics  . Smoking status: Current Every Day Smoker    Packs/day: 0.50    Types: Cigarettes  . Smokeless tobacco: Never Used  . Alcohol use No     Review of Systems  Constitutional: No fever/chills ENT: No upper respiratory complaints. Cardiovascular: No chest pain. No palpitations. Respiratory: No cough. No SOB. Gastrointestinal: No abdominal pain.  No nausea, no vomiting.  Musculoskeletal: Negative for musculoskeletal pain. Skin: Negative for rash, abrasions, lacerations, ecchymosis. Neurological: Negative for headaches, numbness or tingling   ____________________________________________   PHYSICAL EXAM:  VITAL SIGNS: ED Triage Vitals  Enc Vitals Group     BP 12/17/16 0823 (!) 148/91     Pulse Rate 12/17/16 0823 77     Resp 12/17/16 0823 16     Temp 12/17/16 0823 97.3 F (36.3 C)     Temp Source 12/17/16 [id number]  Oral     SpO2 12/17/16 0823 99 %     Weight 12/17/16 0824 180 lb (81.6 kg)     Height 12/17/16 0824 6' (1.829 m)     Head Circumference --      Peak Flow --      Pain Score 12/17/16 0827 8     Pain Loc --      Pain Edu? --      Excl. in [place]? --      Constitutional: Alert and oriented. Well appearing and in no acute distress. Eyes: Conjunctivae are normal. PERRL. EOMI. Tenderness to palpation under left eye. Head: Atraumatic. ENT:       Ears:      Nose: No congestion/rhinnorhea.Tenderness over nasal bridge.       Mouth/Throat: Mucous membranes are moist.  Neck: No stridor.  No cervical spine tenderness to palpation. Cardiovascular: Normal rate, regular rhythm. Normal S1 and S2.  Good peripheral circulation. Respiratory: Normal respiratory effort without tachypnea or retractions. Lungs CTAB. Good air entry to the bases with no decreased or absent breath sounds. Gastrointestinal: Bowel sounds 4 quadrants. Soft and nontender to palpation. No guarding or rigidity. No palpable masses. No distention.  Musculoskeletal: Full range of motion to all extremities. No gross deformities appreciated. Neurologic:  Normal speech and language. No gross focal neurologic deficits are appreciated.  Skin:  Skin is warm, dry and intact. No rash noted. Psychiatric: Mood and affect are normal. Speech and behavior are normal. Patient exhibits appropriate insight and judgement.   ____________________________________________   LABS (all labs ordered are listed, but only abnormal results are displayed)  Labs Reviewed - No data to display ____________________________________________  EKG   ____________________________________________  RADIOLOGY  Ct Head Wo Contrast  Result Date: 12/17/2016 CLINICAL DATA:  Fall from toilet injuring nose. EXAM: CT HEAD WITHOUT CONTRAST CT MAXILLOFACIAL WITHOUT CONTRAST TECHNIQUE: Multidetector CT imaging of the head and maxillofacial structures were performed using the standard protocol without intravenous contrast. Multiplanar CT image reconstructions of the maxillofacial structures were also generated. COMPARISON:  Head CT 12/25/2012 and orbit CT 02/14/2013 FINDINGS: CT HEAD FINDINGS Brain: The ventricles, cisterns and other CSF spaces are normal. There is no mass, mass effect, shift of midline structures or acute hemorrhage. There is no evidence of acute infarction. Vascular: Within normal. Skull: Within normal. Sinuses/Orbits: Within normal. CT MAXILLOFACIAL FINDINGS Osseous: No evidence  of facial bone fracture. Patient is edentulous. Orbits: Normal symmetric. Sinuses: Subtle mucosal membrane thickening within the maxillary sinuses and minimal opacification over the ethmoid air cells compatible chronic inflammatory change. No air-fluid levels. Mild deviation of the nasal septum to the left. Subtle opacification over the right ostiomeatal complex. Visualized mastoid air cells are clear. Soft tissues: Within normal. IMPRESSION: No acute intracranial findings. Minimal chronic sinus inflammatory change. No acute facial bone fracture. Electronically Signed   By: Marin Olp M.D.   On: 12/17/2016 09:38   Ct Maxillofacial Wo Contrast  Result Date: 12/17/2016 CLINICAL DATA:  Fall from toilet injuring nose. EXAM: CT HEAD WITHOUT CONTRAST CT MAXILLOFACIAL WITHOUT CONTRAST TECHNIQUE: Multidetector CT imaging of the head and maxillofacial structures were performed using the standard protocol without intravenous contrast. Multiplanar CT image reconstructions of the maxillofacial structures were also generated. COMPARISON:  Head CT 12/25/2012 and orbit CT 02/14/2013 FINDINGS: CT HEAD FINDINGS Brain: The ventricles, cisterns and other CSF spaces are normal. There is no mass, mass effect, shift of midline structures or acute hemorrhage. There is no evidence of acute infarction. Vascular: Within  normal. Skull: Within normal. Sinuses/Orbits: Within normal. CT MAXILLOFACIAL FINDINGS Osseous: No evidence of facial bone fracture. Patient is edentulous. Orbits: Normal symmetric. Sinuses: Subtle mucosal membrane thickening within the maxillary sinuses and minimal opacification over the ethmoid air cells compatible chronic inflammatory change. No air-fluid levels. Mild deviation of the nasal septum to the left. Subtle opacification over the right ostiomeatal complex. Visualized mastoid air cells are clear. Soft tissues: Within normal. IMPRESSION: No acute intracranial findings. Minimal chronic sinus inflammatory  change. No acute facial bone fracture. Electronically Signed   By: Marin Olp M.D.   On: 12/17/2016 09:38    ____________________________________________    PROCEDURES  Procedure(s) performed:    Procedures    Medications - No data to display   ____________________________________________   INITIAL IMPRESSION / ASSESSMENT AND PLAN / ED COURSE  Pertinent labs & imaging results that were available during my care of the patient were reviewed by me and considered in my medical decision making (see chart for details).  Review of the [place] CSRS was performed in accordance of the [place] prior to dispensing any controlled drugs.  Clinical Course     Patient's diagnosis is consistent with nasal contusion. CT negative for hemorrhage or fractures. Patient has taken Roxicet previously without allergy symptoms. Patient will be discharged home with prescriptions for naproxen and Roxicet. Patient is to follow up with PCP this week. Patient is to follow up with sleep specialist in January. Patient is given ED precautions to return to the ED for any worsening or new symptoms.     ____________________________________________  FINAL CLINICAL IMPRESSION(S) / ED DIAGNOSES  Final diagnoses:  Contusion of nose, initial encounter      NEW MEDICATIONS STARTED DURING THIS VISIT:  Discharge Medication List as of 12/17/2016  9:55 AM    START taking these medications   Details  naproxen (NAPROSYN) 500 MG tablet Take 1 tablet (500 mg total) by mouth 2 (two) times daily with a meal., Starting Sun 12/17/2016, Until Mon 12/17/2017, Print    oxyCODONE-acetaminophen (ROXICET) 5-325 MG tablet Take 1 tablet by mouth every 6 (six) hours as needed for severe pain., Starting Sun 12/17/2016, Print            This chart was dictated using voice recognition software/Dragon. Despite best efforts to proofread, errors can occur which can change the meaning. Any change was purely unintentional.     Laban Emperor, PA-C 12/17/16 1014    Orbie Pyo, MD 12/17/16 [phone]

## 2017-01-08 ENCOUNTER — Emergency Department: Payer: Self-pay

## 2017-01-08 ENCOUNTER — Encounter: Payer: Self-pay | Admitting: *Deleted

## 2017-01-08 ENCOUNTER — Emergency Department
Admission: EM | Admit: 2017-01-08 | Discharge: 2017-01-08 | Disposition: A | Discharge status: Home / Self Care | Payer: Self-pay | Attending: Emergency Medicine | Admitting: Emergency Medicine

## 2017-01-08 DIAGNOSIS — X501XXA Overexertion from prolonged static or awkward postures, initial encounter: Secondary | ICD-10-CM | POA: Insufficient documentation

## 2017-01-08 DIAGNOSIS — S29012A Strain of muscle and tendon of back wall of thorax, initial encounter: Secondary | ICD-10-CM | POA: Insufficient documentation

## 2017-01-08 DIAGNOSIS — F17210 Nicotine dependence, cigarettes, uncomplicated: Secondary | ICD-10-CM | POA: Insufficient documentation

## 2017-01-08 DIAGNOSIS — Y929 Unspecified place or not applicable: Secondary | ICD-10-CM | POA: Insufficient documentation

## 2017-01-08 DIAGNOSIS — Y999 Unspecified external cause status: Secondary | ICD-10-CM | POA: Insufficient documentation

## 2017-01-08 DIAGNOSIS — S29019A Strain of muscle and tendon of unspecified wall of thorax, initial encounter: Secondary | ICD-10-CM

## 2017-01-08 DIAGNOSIS — Y9389 Activity, other specified: Secondary | ICD-10-CM | POA: Insufficient documentation

## 2017-01-08 MED ORDER — OXYCODONE-ACETAMINOPHEN 5-325 MG PO TABS: 1.0000 | ORAL_TABLET | Freq: Four times a day (QID) | ORAL | 0 refills | Status: DC | PRN

## 2017-01-08 MED ORDER — OXYCODONE-ACETAMINOPHEN 5-325 MG PO TABS
1.0000 | ORAL_TABLET | Freq: Once | ORAL | Status: AC
  Administered 2017-01-08: 1 via ORAL
  Filled 2017-01-08: qty 1

## 2017-01-08 MED ORDER — NABUMETONE 750 MG PO TABS: 750.0000 mg | ORAL_TABLET | Freq: Two times a day (BID) | ORAL | 0 refills | Status: DC

## 2017-01-08 NOTE — ED Notes (Signed)
AAOx3.  Skin warm and dry. NAD.  Ambulates with easy and steady gait.   

## 2017-01-08 NOTE — ED Triage Notes (Addendum)
States mid back pain that shoots through to his chest, states pain for about 1 week, states he believed he twisted his back while having sex, states pain is constant, awake and alert in no acute distress, states hx of back pain, ambulatory, states pain worseness when he sits in a chair or lies down on his back

## 2017-01-08 NOTE — Discharge Instructions (Signed)
Follow-up with your doctor [facility] if any continued problems. You may use ice or heat to your back as needed. Percocet as needed for pain only as directed. Relafen 750 mg 1 twice a day with food.

## 2017-01-08 NOTE — ED Notes (Signed)
See triage note  States he twisted his back during sex last week  Having pain to mid back  Moving into mid chest   Area is tender on palpation

## 2017-01-08 NOTE — ED Provider Notes (Signed)
[facility] Emergency Department Provider Note   ____________________________________________   First MD Initiated Contact with Patient 01/08/17 1034     (approximate)  I have reviewed the triage vital signs and the nursing notes.   HISTORY  Chief Complaint Back Pain    HPI Johnny Tripp. is a 46 y.o. male is here with complaint of mid back pain that shoots through to his chest that has been occurring for approximately one week. Patient believes that he twisted his back while having sex and has continued to have pain since that time. He states he is taken over-the-counter medication without any relief of his back pain. Patient states that his pain is made worse when he sits in a chair lies down on his back. Patient denies any previous problems with his back but has had cervical fusion in the past. He denies any paresthesias of his upper or lower extremities. He denies any urinary symptoms or history of kidney stones. Patient denies any chest pain or shortness of breath. Currently he rates his pain is 7 out of 10.   Past Medical History:  Diagnosis Date  . Chronic back pain   . Chronic neck pain   . Herniated disc     There are no active problems to display for this patient.   Past Surgical History:  Procedure Laterality Date  . HERNIA REPAIR    . KNEE SURGERY    . neck fusion      Prior to Admission medications   Medication Sig Start Date End Date Taking? Authorizing Provider  nabumetone (RELAFEN) 750 MG tablet Take 1 tablet (750 mg total) by mouth 2 (two) times daily. 01/08/17   Johnn Hai, PA-C  oxyCODONE-acetaminophen (PERCOCET) 5-325 MG tablet Take 1 tablet by mouth every 6 (six) hours as needed for severe pain. 01/08/17   Johnn Hai, PA-C    Allergies Flexeril [cyclobenzaprine]; Codeine; Latex; and Tramadol  History reviewed. No pertinent family history.  Social History Social History  Substance Use Topics  . Smoking  status: Current Every Day Smoker    Packs/day: 0.50    Types: Cigarettes  . Smokeless tobacco: Never Used  . Alcohol use No    Review of Systems Constitutional: No fever/chills Eyes: No visual changes. Cardiovascular: Denies chest pain. Respiratory: Denies shortness of breath. Gastrointestinal:   No nausea, no vomiting.  Genitourinary: Negative for dysuria. Musculoskeletal: Positive for upper back pain. Skin: Negative for rash. Neurological: Negative for headaches, focal weakness or numbness.  10-point ROS otherwise negative.  ____________________________________________   PHYSICAL EXAM:  VITAL SIGNS: ED Triage Vitals  Enc Vitals Group     BP 01/08/17 1024 (!) 145/99     Pulse Rate 01/08/17 1024 74     Resp 01/08/17 1024 18     Temp 01/08/17 1024 98.1 F (36.7 C)     Temp Source 01/08/17 1024 Oral     SpO2 01/08/17 1024 99 %     Weight 01/08/17 1022 180 lb (81.6 kg)     Height 01/08/17 1022 6' (1.829 m)     Head Circumference --      Peak Flow --      Pain Score 01/08/17 1023 7     Pain Loc --      Pain Edu? --      Excl. in [place]? --     Constitutional: Alert and oriented. Well appearing and in no acute distress. Eyes: Conjunctivae are normal. PERRL. EOMI. Head:  Atraumatic. Nose: No congestion/rhinnorhea. Neck: No stridor.   Cardiovascular: Normal rate, regular rhythm. Grossly normal heart sounds.  Good peripheral circulation. Respiratory: Normal respiratory effort.  No retractions. Lungs CTAB. Gastrointestinal: Soft and nontender. No distention.  No CVA tenderness. Musculoskeletal: On examination of the back there is no gross deformity. There is tenderness on palpation thoracic spine. No soft tissue tenderness was noted. No active muscle spasms are seen. Patient is able move upper and lower extremities without any difficulty. Normal gait was noted. Neurologic:  Normal speech and language. No gross focal neurologic deficits are appreciated. Reflexes were 2+  bilateral patella tendon. No gait instability. Skin:  Skin is warm, dry and intact. No rash noted. Psychiatric: Mood and affect are normal. Speech and behavior are normal.  ____________________________________________   LABS (all labs ordered are listed, but only abnormal results are displayed)  Labs Reviewed - No data to display ____________________________________________  EKG  EKG with normal sinus rhythm. Ventricular rate 80. QRS duration 88, PR interval 118. ____________________________________________  RADIOLOGY  Thoracic spine x-ray per radiologist shows no acute abnormalities. I, Johnn Hai, personally viewed and evaluated these images (plain radiographs) as part of my medical decision making, as well as reviewing the written report by the radiologist. ____________________________________________   PROCEDURES  Procedure(s) performed: None  Procedures  Critical Care performed: No  ____________________________________________   INITIAL IMPRESSION / ASSESSMENT AND PLAN / ED COURSE  Pertinent labs & imaging results that were available during my care of the patient were reviewed by me and considered in my medical decision making (see chart for details).    Clinical Course    Patient was given Percocet prior to x-rays of his back. Prior to discharge patient had decreased pain and is resting comfortably. Patient was given a prescription for Percocet 5/325 #10 and Relafen as 750 mg 1 twice a day with food. Patient was encouraged to use ice or heat to his back as needed. He is to follow-up with his primary care doctor in [place] if any continued problems.  ____________________________________________   FINAL CLINICAL IMPRESSION(S) / ED DIAGNOSES  Final diagnoses:  Thoracic myofascial strain, initial encounter      NEW MEDICATIONS STARTED DURING THIS VISIT:  Discharge Medication List as of 01/08/2017 11:40 AM       Note:  This document was  prepared using Dragon voice recognition software and may include unintentional dictation errors.    Johnn Hai, PA-C 01/08/17 [place], MD 01/08/17 [phone]

## 2017-03-02 ENCOUNTER — Emergency Department: Payer: Medicaid Other

## 2017-03-02 ENCOUNTER — Emergency Department
Admission: EM | Admit: 2017-03-02 | Discharge: 2017-03-02 | Disposition: A | Discharge status: Home / Self Care | Payer: Medicaid Other | Attending: Emergency Medicine | Admitting: Emergency Medicine

## 2017-03-02 ENCOUNTER — Encounter: Payer: Self-pay | Admitting: Emergency Medicine

## 2017-03-02 DIAGNOSIS — F17210 Nicotine dependence, cigarettes, uncomplicated: Secondary | ICD-10-CM | POA: Diagnosis not present

## 2017-03-02 DIAGNOSIS — S20212A Contusion of left front wall of thorax, initial encounter: Secondary | ICD-10-CM | POA: Diagnosis not present

## 2017-03-02 DIAGNOSIS — Y929 Unspecified place or not applicable: Secondary | ICD-10-CM | POA: Insufficient documentation

## 2017-03-02 DIAGNOSIS — W500XXA Accidental hit or strike by another person, initial encounter: Secondary | ICD-10-CM | POA: Diagnosis not present

## 2017-03-02 DIAGNOSIS — Z79899 Other long term (current) drug therapy: Secondary | ICD-10-CM | POA: Diagnosis not present

## 2017-03-02 DIAGNOSIS — Y999 Unspecified external cause status: Secondary | ICD-10-CM | POA: Insufficient documentation

## 2017-03-02 DIAGNOSIS — Y939 Activity, unspecified: Secondary | ICD-10-CM | POA: Insufficient documentation

## 2017-03-02 DIAGNOSIS — S299XXA Unspecified injury of thorax, initial encounter: Secondary | ICD-10-CM | POA: Diagnosis present

## 2017-03-02 MED ORDER — OXYCODONE-ACETAMINOPHEN 5-325 MG PO TABS
1.0000 | ORAL_TABLET | ORAL | 0 refills | Status: DC | PRN
Start: 2017-03-02 — End: 2017-06-19

## 2017-03-02 MED ORDER — OXYCODONE-ACETAMINOPHEN 5-325 MG PO TABS
1.0000 | ORAL_TABLET | Freq: Once | ORAL | Status: AC
  Administered 2017-03-02: 1 via ORAL
  Filled 2017-03-02: qty 1

## 2017-03-02 NOTE — ED Notes (Signed)
Pt verbalizes understanding of discharge instructions, medications and f/u
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
## 2017-03-02 NOTE — ED Provider Notes (Signed)
[facility] Emergency Department Provider Note   First MD Initiated Contact with Patient 03/02/17 [phone]     (approximate)  I have reviewed the triage vital signs and the nursing notes.   HISTORY  Chief Complaint Left side rib pain   HPI Johnny [name]. is a 46 y.o. male presents with history of left-sided chest pain since his daughter jumped on his back while he was on the floor 6 days ago. Patient states pain is worse with palpation and deep inspiration. Patient states his current pain score is 8 out of 10.   Past Medical History:  Diagnosis Date  . Chronic back pain   . Chronic neck pain   . Herniated disc     There are no active problems to display for this patient.   Past Surgical History:  Procedure Laterality Date  . HERNIA REPAIR    . KNEE SURGERY    . neck fusion      Prior to Admission medications   Medication Sig Start Date End Date Taking? Authorizing Provider  nabumetone (RELAFEN) 750 MG tablet Take 1 tablet (750 mg total) by mouth 2 (two) times daily. 01/08/17   Johnn Hai, PA-C  oxyCODONE-acetaminophen (PERCOCET) 5-325 MG tablet Take 1 tablet by mouth every 6 (six) hours as needed for severe pain. 01/08/17   Johnn Hai, PA-C    Allergies Flexeril [cyclobenzaprine]; Codeine; Latex; and Tramadol  No family history on file.  Social History Social History  Substance Use Topics  . Smoking status: Current Every Day Smoker    Packs/day: 0.50    Types: Cigarettes  . Smokeless tobacco: Never Used  . Alcohol use No    Review of Systems Constitutional: No fever/chills Eyes: No visual changes. ENT: No sore throat. Cardiovascular: Positive for left chest wall pain Respiratory: Denies shortness of breath. Gastrointestinal: No abdominal pain.  No nausea, no vomiting.  No diarrhea.  No constipation. Genitourinary: Negative for dysuria. Musculoskeletal: Negative for back pain. Skin: Negative for rash. Neurological:  Negative for headaches, focal weakness or numbness.  10-point ROS otherwise negative.  ____________________________________________   PHYSICAL EXAM:  VITAL SIGNS: ED Triage Vitals  Enc Vitals Group     BP 03/02/17 0856 (!) 161/90     Pulse Rate 03/02/17 0856 89     Resp 03/02/17 0856 18     Temp 03/02/17 0856 97.9 F (36.6 C)     Temp Source 03/02/17 0856 Oral     SpO2 03/02/17 0856 96 %     Weight 03/02/17 0857 180 lb (81.6 kg)     Height 03/02/17 0857 6' (1.829 m)     Head Circumference --      Peak Flow --      Pain Score 03/02/17 0857 7     Pain Loc --      Pain Edu? --      Excl. in [place]? --     Constitutional: Alert and oriented. Well appearing and in no acute distress. Eyes: Conjunctivae are normal. PERRL. EOMI. Head: Atraumatic. Ears:  Healthy appearing ear canals and TMs bilaterally Nose: No congestion/rhinnorhea. Mouth/Throat: Mucous membranes are moist.Oropharynx non-erythematous. Neck: No stridor.  No meningeal signs.  No cervical spine tenderness to palpation. Cardiovascular: Normal rate, regular rhythm. Good peripheral circulation. Grossly normal heart sounds.Pain with palpation of the left chest wall in level of the ninth and 10th rib Respiratory: Normal respiratory effort.  No retractions. Lungs CTAB. Gastrointestinal: Soft and nontender. No distention.  Musculoskeletal: No lower extremity tenderness nor edema. No gross deformities of extremities. Neurologic:  Normal speech and language. No gross focal neurologic deficits are appreciated.  Skin:  Skin is warm, dry and intact. No rash noted.   RADIOLOGY I, [place], personally viewed and evaluated these images (plain radiographs) as part of my medical decision making, as well as reviewing the written report by the radiologist.  Dg Chest 2 View  Result Date: 03/02/2017 CLINICAL DATA:  Recent blunt trauma to the left chest with left rib pain. EXAM: CHEST  2 VIEW COMPARISON:  09/02/2013 chest  radiograph. FINDINGS: Surgical hardware from [facility] overlies the lower cervical spine. Stable cardiomediastinal silhouette with normal heart size. No pneumothorax. No pleural effusion. Lungs appear clear, with no acute consolidative airspace disease and no pulmonary edema. No displaced fractures in the chest. No displaced fractures. IMPRESSION: No active cardiopulmonary disease. If the patient's symptoms persist, consider dedicated rib radiographs. Electronically Signed   By: Ilona Sorrel M.D.   On: 03/02/2017 09:27     Procedures    INITIAL IMPRESSION / ASSESSMENT AND PLAN / ED COURSE  Pertinent labs & imaging results that were available during my care of the patient were reviewed by me and considered in my medical decision making (see chart for details).  Patient given a Percocet in the emergency department for pain. Patient has reproducible pain with chest wall palpation chest x-ray revealed no displaced fracture however possibility of a nondisplaced fracture still possible.    ____________________________________________  FINAL CLINICAL IMPRESSION(S) / ED DIAGNOSES  Final diagnoses:  Chest wall contusion, left, initial encounter     MEDICATIONS GIVEN DURING THIS VISIT:  Medications  oxyCODONE-acetaminophen (PERCOCET/ROXICET) 5-325 MG per tablet 1 tablet (not administered)     NEW OUTPATIENT MEDICATIONS STARTED DURING THIS VISIT:  New Prescriptions   No medications on file    Modified Medications   No medications on file    Discontinued Medications   No medications on file     Note:  This document was prepared using Dragon voice recognition software and may include unintentional dictation errors.    Gregor Hams, MD 03/02/17 1011

## 2017-04-15 ENCOUNTER — Encounter: Payer: Self-pay | Admitting: Emergency Medicine

## 2017-04-15 ENCOUNTER — Emergency Department
Admission: EM | Admit: 2017-04-15 | Discharge: 2017-04-15 | Discharge status: Against Medical Advice | Payer: Medicaid Other | Attending: Emergency Medicine | Admitting: Emergency Medicine

## 2017-04-15 ENCOUNTER — Emergency Department
Admission: EM | Admit: 2017-04-15 | Discharge: 2017-04-15 | Disposition: A | Discharge status: Home / Self Care | Payer: Medicaid Other | Source: Home / Self Care | Attending: Emergency Medicine | Admitting: Emergency Medicine

## 2017-04-15 DIAGNOSIS — F17210 Nicotine dependence, cigarettes, uncomplicated: Secondary | ICD-10-CM | POA: Insufficient documentation

## 2017-04-15 DIAGNOSIS — R197 Diarrhea, unspecified: Secondary | ICD-10-CM | POA: Insufficient documentation

## 2017-04-15 DIAGNOSIS — F10920 Alcohol use, unspecified with intoxication, uncomplicated: Secondary | ICD-10-CM

## 2017-04-15 DIAGNOSIS — Z91040 Latex allergy status: Secondary | ICD-10-CM | POA: Diagnosis not present

## 2017-04-15 DIAGNOSIS — R1110 Vomiting, unspecified: Secondary | ICD-10-CM | POA: Insufficient documentation

## 2017-04-15 DIAGNOSIS — Z79899 Other long term (current) drug therapy: Secondary | ICD-10-CM | POA: Insufficient documentation

## 2017-04-15 DIAGNOSIS — F10220 Alcohol dependence with intoxication, uncomplicated: Secondary | ICD-10-CM | POA: Insufficient documentation

## 2017-04-15 DIAGNOSIS — R112 Nausea with vomiting, unspecified: Secondary | ICD-10-CM

## 2017-04-15 LAB — CBC WITH DIFFERENTIAL/PLATELET
Basophils Absolute: 0 10*3/uL (ref 0–0.1)
Basophils Relative: 0 %
Eosinophils Absolute: 0 10*3/uL (ref 0–0.7)
Eosinophils Relative: 0 %
HCT: 52.2 % — ABNORMAL HIGH (ref 40.0–52.0)
Hemoglobin: 18.1 g/dL — ABNORMAL HIGH (ref 13.0–18.0)
Lymphocytes Relative: 37 %
Lymphs Abs: 3.9 10*3/uL — ABNORMAL HIGH (ref 1.0–3.6)
MCH: 32.9 pg (ref 26.0–34.0)
MCHC: 34.7 g/dL (ref 32.0–36.0)
MCV: 95 fL (ref 80.0–100.0)
Monocytes Absolute: 0.9 10*3/uL (ref 0.2–1.0)
Monocytes Relative: 8 %
Neutro Abs: 5.8 10*3/uL (ref 1.4–6.5)
Neutrophils Relative %: 55 %
Platelets: 361 10*3/uL (ref 150–440)
RBC: 5.49 MIL/uL (ref 4.40–5.90)
RDW: 14.2 % (ref 11.5–14.5)
WBC: 10.6 10*3/uL (ref 3.8–10.6)

## 2017-04-15 LAB — COMPREHENSIVE METABOLIC PANEL
ALT: 19 U/L (ref 17–63)
AST: 38 U/L (ref 15–41)
Albumin: 4.6 g/dL (ref 3.5–5.0)
Alkaline Phosphatase: 131 U/L — ABNORMAL HIGH (ref 38–126)
Anion gap: 16 — ABNORMAL HIGH (ref 5–15)
BUN: 10 mg/dL (ref 6–20)
CO2: 19 mmol/L — ABNORMAL LOW (ref 22–32)
Calcium: 8.7 mg/dL — ABNORMAL LOW (ref 8.9–10.3)
Chloride: 104 mmol/L (ref 101–111)
Creatinine, Ser: 0.91 mg/dL (ref 0.61–1.24)
GFR calc Af Amer: >60 mL/min (ref >60)
GFR calc non Af Amer: >60 mL/min (ref >60)
Glucose, Bld: 97 mg/dL (ref 65–99)
Potassium: 4 mmol/L (ref 3.5–5.1)
Sodium: 139 mmol/L (ref 135–145)
Total Bilirubin: 0.8 mg/dL (ref 0.3–1.2)
Total Protein: 8.3 g/dL — ABNORMAL HIGH (ref 6.5–8.1)

## 2017-04-15 LAB — LIPASE, BLOOD: Lipase: 36 U/L (ref 11–51)

## 2017-04-15 LAB — ETHANOL: Alcohol, Ethyl (B): 388 mg/dL (ref <5)

## 2017-04-15 MED ORDER — LORAZEPAM 2 MG/ML IJ SOLN
1.0000 mg | Freq: Once | INTRAMUSCULAR | Status: AC
  Administered 2017-04-15: 1 mg via INTRAVENOUS

## 2017-04-15 MED ORDER — SODIUM CHLORIDE 0.9 % IV BOLUS (SEPSIS)
1000.0000 mL | Freq: Once | INTRAVENOUS | Status: AC
  Administered 2017-04-15: 1000 mL via INTRAVENOUS

## 2017-04-15 MED ORDER — ONDANSETRON HCL 4 MG/2ML IJ SOLN
INTRAMUSCULAR | Status: AC
  Administered 2017-04-15: 05:00:00
  Filled 2017-04-15: qty 2

## 2017-04-15 MED ORDER — THIAMINE HCL 100 MG/ML IJ SOLN
100.0000 mg | Freq: Every day | INTRAMUSCULAR | Status: DC
  Administered 2017-04-15: 100 mg via INTRAVENOUS
  Filled 2017-04-15: qty 2

## 2017-04-15 MED ORDER — ACETAMINOPHEN 500 MG PO TABS
ORAL_TABLET | ORAL | Status: AC
  Filled 2017-04-15: qty 2

## 2017-04-15 MED ORDER — ONDANSETRON HCL 4 MG/2ML IJ SOLN
4.0000 mg | Freq: Once | INTRAMUSCULAR | Status: AC
  Administered 2017-04-15: 4 mg via INTRAVENOUS

## 2017-04-15 MED ORDER — LORAZEPAM 2 MG/ML IJ SOLN: 0.0000 mg | Freq: Four times a day (QID) | INTRAMUSCULAR | Status: DC

## 2017-04-15 MED ORDER — THIAMINE HCL 100 MG/ML IJ SOLN
Freq: Once | INTRAVENOUS | Status: AC
  Administered 2017-04-15: 06:00:00 via INTRAVENOUS
  Filled 2017-04-15: qty 1000

## 2017-04-15 MED ORDER — ACETAMINOPHEN 500 MG PO TABS
1000.0000 mg | ORAL_TABLET | Freq: Once | ORAL | Status: AC
  Administered 2017-04-15: 1000 mg via ORAL

## 2017-04-15 MED ORDER — LORAZEPAM 2 MG/ML IJ SOLN
INTRAMUSCULAR | Status: AC
  Filled 2017-04-15: qty 1

## 2017-04-15 NOTE — ED Notes (Signed)
Pt requesting something for "shaking". Pt laying quietly in the hall bed with no visible shaking. ciwa score obtained with vital signs. Scores "2".

## 2017-04-15 NOTE — ED Provider Notes (Signed)
[facility] Emergency Department Provider Note   ____________________________________________   First MD Initiated Contact with Patient 04/15/17 [phone]     (approximate)  I have reviewed the triage vital signs and the nursing notes.   HISTORY  Chief Complaint Emesis; Diarrhea; and Alcohol Problem    HPI Johnny [name]. is a 46 y.o. male brought to the ED from home via EMS with a chief complaint of "shakes", vomiting and diarrhea. Patient reports binge drinking 3 days. States he stopped drinking at 8 PM and awoke with shaking, vomiting and diarrhea. Claims he has thrown up over 17 times. Denies fever, chills, chest pain, shortness of breath, abdominal pain. Per EMS, patient was argumentative and verbally hostile. Denies recent travel or trauma.   Past Medical History:  Diagnosis Date  . Chronic back pain   . Chronic neck pain   . Herniated disc     There are no active problems to display for this patient.   Past Surgical History:  Procedure Laterality Date  . HERNIA REPAIR    . KNEE SURGERY    . neck fusion      Prior to Admission medications   Medication Sig Start Date End Date Taking? Authorizing Provider  nabumetone (RELAFEN) 750 MG tablet Take 1 tablet (750 mg total) by mouth 2 (two) times daily. 01/08/17   Johnn Hai, PA-C  oxyCODONE-acetaminophen (PERCOCET) 5-325 MG tablet Take 1 tablet by mouth every 6 (six) hours as needed for severe pain. 01/08/17   Johnn Hai, PA-C  oxyCODONE-acetaminophen (ROXICET) 5-325 MG tablet Take 1 tablet by mouth every 4 (four) hours as needed for severe pain. 03/02/17   Gregor Hams, MD    Allergies Flexeril [cyclobenzaprine]; Codeine; Latex; and Tramadol  History reviewed. No pertinent family history.  Social History Social History  Substance Use Topics  . Smoking status: Current Every Day Smoker    Packs/day: 0.50    Types: Cigarettes  . Smokeless tobacco: Never Used  . Alcohol use  Yes    Review of Systems  Constitutional: No fever/chills. Eyes: No visual changes. ENT: No sore throat. Cardiovascular: Denies chest pain. Respiratory: Denies shortness of breath. Gastrointestinal: No abdominal pain.  Positive for nausea, vomiting and diarrhea.  No constipation. Genitourinary: Negative for dysuria. Musculoskeletal: Negative for back pain. Skin: Negative for rash. Neurological: Negative for headaches, focal weakness or numbness. Psychiatric:Denies SI/HI/AH/VH.  10-point ROS otherwise negative.  ____________________________________________   PHYSICAL EXAM:  VITAL SIGNS: ED Triage Vitals  Enc Vitals Group     BP 04/15/17 0459 (!) 180/112     Pulse Rate 04/15/17 0459 (!) 111     Resp 04/15/17 0459 20     Temp 04/15/17 0459 98 F (36.7 C)     Temp Source 04/15/17 0459 Oral     SpO2 04/15/17 0459 100 %     Weight 04/15/17 0501 180 lb (81.6 kg)     Height 04/15/17 0501 6' (1.829 m)     Head Circumference --      Peak Flow --      Pain Score --      Pain Loc --      Pain Edu? --      Excl. in [place]? --     Constitutional: Alert and oriented. Well appearing and in mild acute distress. Tearful, anxious. Eyes: Conjunctivae are normal. PERRL. EOMI. Head: Atraumatic. Nose: No congestion/rhinnorhea. Mouth/Throat: Mucous membranes are moist.  Oropharynx non-erythematous. Neck: No stridor.  No  cervical spine tenderness to palpation. Cardiovascular: Tachycardic rate, regular rhythm. Grossly normal heart sounds.  Good peripheral circulation. Respiratory: Normal respiratory effort.  No retractions. Lungs CTAB. Gastrointestinal: Soft and nontender to light and deep palpation. No distention. No abdominal bruits. No CVA tenderness. Musculoskeletal: No lower extremity tenderness nor edema.  No joint effusions. Neurologic:  Normal speech and language. No gross focal neurologic deficits are appreciated. No gait instability. Ambulated with steady gait to treatment  room. Skin:  Skin is warm, dry and intact. No rash noted. Psychiatric: Mood and affect are tearful. Speech and behavior are normal.  ____________________________________________   LABS (all labs ordered are listed, but only abnormal results are displayed)  Labs Reviewed  CBC WITH DIFFERENTIAL/PLATELET - Abnormal; Notable for the following:       Result Value   Hemoglobin 18.1 (*)    HCT 52.2 (*)    Lymphs Abs 3.9 (*)    All other components within normal limits  COMPREHENSIVE METABOLIC PANEL - Abnormal; Notable for the following:    CO2 19 (*)    Calcium 8.7 (*)    Total Protein 8.3 (*)    Alkaline Phosphatase 131 (*)    Anion gap 16 (*)    All other components within normal limits  ETHANOL - Abnormal; Notable for the following:    Alcohol, Ethyl (B) 388 (*)    All other components within normal limits  LIPASE, BLOOD  URINE DRUG SCREEN, QUALITATIVE ([facility] ONLY)   ____________________________________________  EKG  None ____________________________________________  RADIOLOGY  None ____________________________________________   PROCEDURES  Procedure(s) performed: None  Procedures  Critical Care performed: No  ____________________________________________   INITIAL IMPRESSION / ASSESSMENT AND PLAN / ED COURSE  Pertinent labs & imaging results that were available during my care of the patient were reviewed by me and considered in my medical decision making (see chart for details).  46 year old male who presents with nausea/vomiting/diarrhea; also binge drinking and claims he has not had a drink since 8 PM. Placed on CIWA scale and Ativan given for anxiety. IV fluid resuscitation initiated. Will check x-ray and lab work and reassess.  Clinical Course as of Apr 15 704  Sun Apr 15, 2017  0602 Drank a can of ginger ale without emesis.  [JS]  0701 Patient asleep in no acute distress. IV fluids infusing. Anticipate discharge home once patient is sober and ambulatory  with steady gait. Care transferred to Dr. Archie Balboa.  [JS]    Clinical Course User Index [JS] Paulette Blanch, MD     ____________________________________________   FINAL CLINICAL IMPRESSION(S) / ED DIAGNOSES  Final diagnoses:  Nausea and vomiting, intractability of vomiting not specified, unspecified vomiting type  Alcoholic intoxication without complication ([place])  Alcohol dependence with uncomplicated intoxication ([place])      NEW MEDICATIONS STARTED DURING THIS VISIT:  New Prescriptions   No medications on file     Note:  This document was prepared using Dragon voice recognition software and may include unintentional dictation errors.    Paulette Blanch, MD 04/15/17 [phone]

## 2017-04-15 NOTE — ED Notes (Signed)
Pt ambulatory to bathroom with steady gait. Pt waiting for family member to arrive to take him home.

## 2017-04-15 NOTE — ED Notes (Addendum)
Pt again asking for something for his shaking. Spoke with pt about getting help for etoh. Pt states he doesn't normally, but is on a 3 day "binger". Tried to educated pt on what DT's were (pt afraid he will go to into dt's for drinking for 3 days'). Pt's ciwa score 2 and tried to explain how scored (not clammy, not vomiting, not shaking, not tremorous).  Pt states he wants a prescription when he leaves for withdrawals.

## 2017-04-15 NOTE — ED Notes (Signed)
Patient's discharge and follow up information reviewed with patient by ED nursing staff and patient given the opportunity to ask questions pertaining to ED visit and discharge plan of care. Patient advised that should symptoms not continue to improve, resolve entirely, or should new symptoms develop then a follow up visit with their PCP or a return visit to the ED may be warranted. Patient verbalized consent and understanding of discharge plan of care including potential need for further evaluation. Patient discharged in stable condition per attending ED physician on duty.   Walked with patient to lobby. Pt's mother and daughter present to pick pt up. Pt ambulatory to lobby without difficulty.

## 2017-04-15 NOTE — ED Notes (Signed)
Pt got up and ambulated to the bathroom then per tech, walked out.

## 2017-04-15 NOTE — ED Triage Notes (Signed)
Pt states has "been on a binge" pt states he has been consuming etoh for 3 days accompanied by "shakes", vomiting and diarrhea. Pt states last etoh at 2000 yesterday. Pt is argumentative and verbally hostile at time during this triage.

## 2017-04-15 NOTE — ED Notes (Signed)
gingerale provided

## 2017-04-15 NOTE — ED Notes (Signed)
Pt placed on 2L n/c for O2 sat 91% on RA while asleep. Sat on 2L 96%.

## 2017-04-15 NOTE — Discharge Instructions (Signed)
1.  Drink alcohol only in moderation. °2.  Return to the ER for worsening symptoms, persistent vomiting, difficulty breathing or other concerns. °

## 2017-04-15 NOTE — ED Notes (Signed)
FIRST NURSE NOTE: Pt arrived via EMS from home with report of alcohol intoxication and vomiting. Has been binge drinking, but EMS reports pt told them he doesn't have a drinking problem. Pt dry heaving. EMS states when they arrived pt was found on the floor. Pt just discharged today at 1030 am for the same. Family on scene told EMS that the hospital didn't do anything for him and that he needs to stay.

## 2017-04-15 NOTE — ED Notes (Signed)
Pt with multiple phone calls from various "friends" and family members. Messages taken for pt and phone numbers to return calls provided to pt. Pt has own cell phone.

## 2017-04-15 NOTE — ED Notes (Signed)
Report to alicia, rn.  

## 2017-04-15 NOTE — ED Provider Notes (Signed)
[facility] Emergency Department Provider Note  ____________________________________________  Time seen: Approximately 7:29 PM  I have reviewed the triage vital signs and the nursing notes.   HISTORY  Chief Complaint Emesis    HPI Acy Orsak. is a 46 y.o. male who complains of shaking and requests something for the shaking. He reports that he is in DTs and going through alcohol withdrawal and wasn't given anything to control this when he was discharged from the ER this morning. He was observed overnight due to alcohol intoxication with a level of 388. His labs were otherwise unremarkable. He denies any drinking today or acute injuries.     Past Medical History:  Diagnosis Date  . Chronic back pain   . Chronic neck pain   . Herniated disc      There are no active problems to display for this patient.    Past Surgical History:  Procedure Laterality Date  . HERNIA REPAIR    . KNEE SURGERY    . neck fusion       Prior to Admission medications   Medication Sig Start Date End Date Taking? Authorizing Provider  nabumetone (RELAFEN) 750 MG tablet Take 1 tablet (750 mg total) by mouth 2 (two) times daily. 01/08/17   Johnn Hai, PA-C  oxyCODONE-acetaminophen (PERCOCET) 5-325 MG tablet Take 1 tablet by mouth every 6 (six) hours as needed for severe pain. Patient not taking: Reported on 04/15/2017 01/08/17   Johnn Hai, PA-C  oxyCODONE-acetaminophen (ROXICET) 5-325 MG tablet Take 1 tablet by mouth every 4 (four) hours as needed for severe pain. 03/02/17   Gregor Hams, MD     Allergies Flexeril [cyclobenzaprine]; Codeine; Latex; and Tramadol   No family history on file.  Social History Social History  Substance Use Topics  . Smoking status: Current Every Day Smoker    Packs/day: 0.50    Types: Cigarettes  . Smokeless tobacco: Never Used  . Alcohol use Yes    Review of Systems  Constitutional:   No fever or chills.   Gastrointestinal:   Positive vomiting.   Neurological:  Positive "shaking" 10-point ROS otherwise negative.  ____________________________________________   PHYSICAL EXAM:  VITAL SIGNS: ED Triage Vitals  Enc Vitals Group     BP 04/15/17 1749 (!) 156/101     Pulse Rate 04/15/17 1749 99     Resp 04/15/17 1749 18     Temp 04/15/17 1749 98.3 F (36.8 C)     Temp Source 04/15/17 1749 Oral     SpO2 04/15/17 1749 96 %     Weight 04/15/17 1750 180 lb (81.6 kg)     Height 04/15/17 1750 6' (1.829 m)     Head Circumference --      Peak Flow --      Pain Score 04/15/17 1752 7     Pain Loc --      Pain Edu? --      Excl. in [place]? --     Vital signs reviewed, nursing assessments reviewed.   Constitutional:   Alert and oriented. Well appearing and in no distress.   Head:   Normocephalic and atraumatic. Neurologic:   Normal speech and language.  CN 2-10 normal. Motor grossly intact. Ambulates with steady fluid gait No gross focal neurologic deficits are appreciated.   ____________________________________________    LABS (pertinent positives/negatives) (all labs ordered are listed, but only abnormal results are displayed) Labs Reviewed - No data to display ____________________________________________  EKG    ____________________________________________    RADIOLOGY  No results found.  ____________________________________________   PROCEDURES Procedures  ____________________________________________   INITIAL IMPRESSION / ASSESSMENT AND PLAN / ED COURSE  Pertinent labs & imaging results that were available during my care of the patient were reviewed by me and considered in my medical decision making (see chart for details).  Patient well appearing no acute distress, unremarkable vital signs. Not visibly shaking. No evidence of tremor. Nurse reports that on CIWA scale only positive is subjective nausea.   After patient was informed he would not be  receiving narcotic medications in the ER today, he got up to leave. He did not allow any further examination. He is clinically sober and has medical decision-making capacity.       ____________________________________________   FINAL CLINICAL IMPRESSION(S) / ED DIAGNOSES  Final diagnoses:  Non-intractable vomiting, presence of nausea not specified, unspecified vomiting type      New Prescriptions   No medications on file     Portions of this note were generated with dragon dictation software. Dictation errors may occur despite best attempts at proofreading.    Carrie Mew, MD 04/15/17 [id number]

## 2017-04-15 NOTE — ED Notes (Signed)
Pt requesting ginger ale. Explanation of npo status explained to pt who continues to interrupt rn while explanation being provided.

## 2017-04-15 NOTE — ED Triage Notes (Signed)
Pt continues stating he does not have a drinking problem but reports having had "DTs" yesterday after being discharged and reports no improvements in shaking or vomiting. Pt reports having abd pain at this time and reports having vomiting 20 times since yesterday.   Pt reports there was no prescription for Zofran.

## 2017-06-04 DIAGNOSIS — M5431 Sciatica, right side: Secondary | ICD-10-CM | POA: Insufficient documentation

## 2017-06-04 DIAGNOSIS — M5432 Sciatica, left side: Principal | ICD-10-CM

## 2017-06-19 ENCOUNTER — Ambulatory Visit (INDEPENDENT_AMBULATORY_CARE_PROVIDER_SITE_OTHER): Payer: Medicaid Other | Admitting: Family Medicine

## 2017-06-19 ENCOUNTER — Encounter: Payer: Self-pay | Admitting: Family Medicine

## 2017-06-19 VITALS — BP 142/90 | HR 80 | Temp 97.9°F | Ht 68.5 in | Wt 181.4 lb

## 2017-06-19 DIAGNOSIS — R030 Elevated blood-pressure reading, without diagnosis of hypertension: Secondary | ICD-10-CM | POA: Diagnosis not present

## 2017-06-19 DIAGNOSIS — R252 Cramp and spasm: Secondary | ICD-10-CM | POA: Diagnosis not present

## 2017-06-19 DIAGNOSIS — M79601 Pain in right arm: Secondary | ICD-10-CM | POA: Diagnosis not present

## 2017-06-19 DIAGNOSIS — G4700 Insomnia, unspecified: Secondary | ICD-10-CM | POA: Diagnosis not present

## 2017-06-19 DIAGNOSIS — Z13220 Encounter for screening for lipoid disorders: Secondary | ICD-10-CM | POA: Diagnosis not present

## 2017-06-19 LAB — MICROALBUMIN, URINE WAIVED
Creatinine, Urine Waived: 10 mg/dL (ref 10–300)
Microalb, Ur Waived: 10 mg/L (ref 0–19)

## 2017-06-19 LAB — UA/M W/RFLX CULTURE, ROUTINE
Bilirubin, UA: NEGATIVE
Glucose, UA: NEGATIVE
Ketones, UA: NEGATIVE
Leukocytes, UA: NEGATIVE
Nitrite, UA: NEGATIVE
Protein, UA: NEGATIVE
Specific Gravity, UA: 1.01 (ref 1.005–1.030)
Urobilinogen, Ur: 0.2 mg/dL (ref 0.2–1.0)
pH, UA: 6.5 (ref 5.0–7.5)

## 2017-06-19 LAB — MICROSCOPIC EXAMINATION

## 2017-06-19 NOTE — Patient Instructions (Addendum)
Shoulder Exercises Ask your health care provider which exercises are safe for you. Do exercises exactly as told by your health care provider and adjust them as directed. It is normal to feel mild stretching, pulling, tightness, or discomfort as you do these exercises, but you should stop right away if you feel sudden pain or your pain gets worse.Do not begin these exercises until told by your health care provider. RANGE OF MOTION EXERCISES These exercises warm up your muscles and joints and improve the movement and flexibility of your shoulder. These exercises also help to relieve pain, numbness, and tingling. These exercises involve stretching your injured shoulder directly. Exercise A: Pendulum  1. Stand near a wall or a surface that you can hold onto for balance. 2. Bend at the waist and let your left / right arm hang straight down. Use your other arm to support you. Keep your back straight and do not lock your knees. 3. Relax your left / right arm and shoulder muscles, and move your hips and your trunk so your left / right arm swings freely. Your arm should swing because of the motion of your body, not because you are using your arm or shoulder muscles. 4. Keep moving your body so your arm swings in the following directions, as told by your health care provider: ? Side to side. ? Forward and backward. ? In clockwise and counterclockwise circles. 5. Continue each motion for __________ seconds, or for as long as told by your health care provider. 6. Slowly return to the starting position. Repeat __________ times. Complete this exercise __________ times a day. Exercise B:Flexion, Standing  1. Stand and hold a broomstick, a cane, or a similar object. Place your hands a little more than shoulder-width apart on the object. Your left / right hand should be palm-up, and your other hand should be palm-down. 2. Keep your elbow straight and keep your shoulder muscles relaxed. Push the stick down with  your healthy arm to raise your left / right arm in front of your body, and then over your head until you feel a stretch in your shoulder. ? Avoid shrugging your shoulder while you raise your arm. Keep your shoulder blade tucked down toward the middle of your back. 3. Hold for __________ seconds. 4. Slowly return to the starting position. Repeat __________ times. Complete this exercise __________ times a day. Exercise C: Abduction, Standing 1. Stand and hold a broomstick, a cane, or a similar object. Place your hands a little more than shoulder-width apart on the object. Your left / right hand should be palm-up, and your other hand should be palm-down. 2. While keeping your elbow straight and your shoulder muscles relaxed, push the stick across your body toward your left / right side. Raise your left / right arm to the side of your body and then over your head until you feel a stretch in your shoulder. ? Do not raise your arm above shoulder height, unless your health care provider tells you to do that. ? Avoid shrugging your shoulder while you raise your arm. Keep your shoulder blade tucked down toward the middle of your back. 3. Hold for __________ seconds. 4. Slowly return to the starting position. Repeat __________ times. Complete this exercise __________ times a day. Exercise D:Internal Rotation  1. Place your left / right hand behind your back, palm-up. 2. Use your other hand to dangle an exercise band, a towel, or a similar object over your shoulder. Grasp the band with   your left / right hand so you are holding onto both ends. 3. Gently pull up on the band until you feel a stretch in the front of your left / right shoulder. ? Avoid shrugging your shoulder while you raise your arm. Keep your shoulder blade tucked down toward the middle of your back. 4. Hold for __________ seconds. 5. Release the stretch by letting go of the band and lowering your hands. Repeat __________ times. Complete  this exercise __________ times a day. STRETCHING EXERCISES These exercises warm up your muscles and joints and improve the movement and flexibility of your shoulder. These exercises also help to relieve pain, numbness, and tingling. These exercises are done using your healthy shoulder to help stretch the muscles of your injured shoulder. Exercise E: Corner Stretch (External Rotation and Abduction)  1. Stand in a doorway with one of your feet slightly in front of the other. This is called a staggered stance. If you cannot reach your forearms to the door frame, stand facing a corner of a room. 2. Choose one of the following positions as told by your health care provider: ? Place your hands and forearms on the door frame above your head. ? Place your hands and forearms on the door frame at the height of your head. ? Place your hands on the door frame at the height of your elbows. 3. Slowly move your weight onto your front foot until you feel a stretch across your chest and in the front of your shoulders. Keep your head and chest upright and keep your abdominal muscles tight. 4. Hold for __________ seconds. 5. To release the stretch, shift your weight to your back foot. Repeat __________ times. Complete this stretch __________ times a day. Exercise F:Extension, Standing 1. Stand and hold a broomstick, a cane, or a similar object behind your back. ? Your hands should be a little wider than shoulder-width apart. ? Your palms should face away from your back. 2. Keeping your elbows straight and keeping your shoulder muscles relaxed, move the stick away from your body until you feel a stretch in your shoulder. ? Avoid shrugging your shoulders while you move the stick. Keep your shoulder blade tucked down toward the middle of your back. 3. Hold for __________ seconds. 4. Slowly return to the starting position. Repeat __________ times. Complete this exercise __________ times a day. STRENGTHENING  EXERCISES These exercises build strength and endurance in your shoulder. Endurance is the ability to use your muscles for a long time, even after they get tired. Exercise G:External Rotation  1. Sit in a stable chair without armrests. 2. Secure an exercise band at elbow height on your left / right side. 3. Place a soft object, such as a folded towel or a small pillow, between your left / right upper arm and your body to move your elbow a few inches away (about 10 cm) from your side. 4. Hold the end of the band so it is tight and there is no slack. 5. Keeping your elbow pressed against the soft object, move your left / right forearm out, away from your abdomen. Keep your body steady so only your forearm moves. 6. Hold for __________ seconds. 7. Slowly return to the starting position. Repeat __________ times. Complete this exercise __________ times a day. Exercise H:Shoulder Abduction  1. Sit in a stable chair without armrests, or stand. 2. Hold a __________ weight in your left / right hand, or hold an exercise band with both hands.   3. Start with your arms straight down and your left / right palm facing in, toward your body. 4. Slowly lift your left / right hand out to your side. Do not lift your hand above shoulder height unless your health care provider tells you that this is safe. ? Keep your arms straight. ? Avoid shrugging your shoulder while you do this movement. Keep your shoulder blade tucked down toward the middle of your back. 5. Hold for __________ seconds. 6. Slowly lower your arm, and return to the starting position. Repeat __________ times. Complete this exercise __________ times a day. Exercise I:Shoulder Extension 1. Sit in a stable chair without armrests, or stand. 2. Secure an exercise band to a stable object in front of you where it is at shoulder height. 3. Hold one end of the exercise band in each hand. Your palms should face each other. 4. Straighten your elbows and  lift your hands up to shoulder height. 5. Step back, away from the secured end of the exercise band, until the band is tight and there is no slack. 6. Squeeze your shoulder blades together as you pull your hands down to the sides of your thighs. Stop when your hands are straight down by your sides. Do not let your hands go behind your body. 7. Hold for __________ seconds. 8. Slowly return to the starting position. Repeat __________ times. Complete this exercise __________ times a day. Exercise J:Standing Shoulder Row 1. Sit in a stable chair without armrests, or stand. 2. Secure an exercise band to a stable object in front of you so it is at waist height. 3. Hold one end of the exercise band in each hand. Your palms should be in a thumbs-up position. 4. Bend each of your elbows to an "L" shape (about 90 degrees) and keep your upper arms at your sides. 5. Step back until the band is tight and there is no slack. 6. Slowly pull your elbows back behind you. 7. Hold for __________ seconds. 8. Slowly return to the starting position. Repeat __________ times. Complete this exercise __________ times a day. Exercise K:Shoulder Press-Ups  1. Sit in a stable chair that has armrests. Sit upright, with your feet flat on the floor. 2. Put your hands on the armrests so your elbows are bent and your fingers are pointing forward. Your hands should be about even with the sides of your body. 3. Push down on the armrests and use your arms to lift yourself off of the chair. Straighten your elbows and lift yourself up as much as you comfortably can. ? Move your shoulder blades down, and avoid letting your shoulders move up toward your ears. ? Keep your feet on the ground. As you get stronger, your feet should support less of your body weight as you lift yourself up. 4. Hold for __________ seconds. 5. Slowly lower yourself back into the chair. Repeat __________ times. Complete this exercise __________ times a  day. Exercise L: Wall Push-Ups  1. Stand so you are facing a stable wall. Your feet should be about one arm-length away from the wall. 2. Lean forward and place your palms on the wall at shoulder height. 3. Keep your feet flat on the floor as you bend your elbows and lean forward toward the wall. 4. Hold for __________ seconds. 5. Straighten your elbows to push yourself back to the starting position. Repeat __________ times. Complete this exercise __________ times a day. This information is not intended to replace advice   given to you by your health care provider. Make sure you discuss any questions you have with your health care provider. Document Released: 10/25/2005 Document Revised: 09/04/2016 Document Reviewed: 08/22/2015 Elsevier Interactive Patient Education  2018 [employer]. Insomnia Insomnia is a sleep disorder that makes it difficult to fall asleep or to stay asleep. Insomnia can cause tiredness (fatigue), low energy, difficulty concentrating, mood swings, and poor performance at work or school. There are three different ways to classify insomnia:  Difficulty falling asleep.  Difficulty staying asleep.  Waking up too early in the morning.  Any type of insomnia can be long-term (chronic) or short-term (acute). Both are common. Short-term insomnia usually lasts for three months or less. Chronic insomnia occurs at least three times a week for longer than three months. What are the causes? Insomnia may be caused by another condition, situation, or substance, such as:  Anxiety.  Certain medicines.  Gastroesophageal reflux disease (GERD) or other gastrointestinal conditions.  Asthma or other breathing conditions.  Restless legs syndrome, sleep apnea, or other sleep disorders.  Chronic pain.  Menopause. This may include hot flashes.  Stroke.  Abuse of alcohol, tobacco, or illegal drugs.  Depression.  Caffeine.  Neurological disorders, such as Alzheimer  disease.  An overactive thyroid (hyperthyroidism).  The cause of insomnia may not be known. What increases the risk? Risk factors for insomnia include:  Gender. Women are more commonly affected than men.  Age. Insomnia is more common as you get older.  Stress. This may involve your professional or personal life.  Income. Insomnia is more common in people with lower income.  Lack of exercise.  Irregular work schedule or night shifts.  Traveling between different time zones.  What are the signs or symptoms? If you have insomnia, trouble falling asleep or trouble staying asleep is the main symptom. This may lead to other symptoms, such as:  Feeling fatigued.  Feeling nervous about going to sleep.  Not feeling rested in the morning.  Having trouble concentrating.  Feeling irritable, anxious, or depressed.  How is this treated? Treatment for insomnia depends on the cause. If your insomnia is caused by an underlying condition, treatment will focus on addressing the condition. Treatment may also include:  Medicines to help you sleep.  Counseling or therapy.  Lifestyle adjustments.  Follow these instructions at home:  Take medicines only as directed by your health care provider.  Keep regular sleeping and waking hours. Avoid naps.  Keep a sleep diary to help you and your health care provider figure out what could be causing your insomnia. Include: ? When you sleep. ? When you wake up during the night. ? How well you sleep. ? How rested you feel the next day. ? Any side effects of medicines you are taking. ? What you eat and drink.  Make your bedroom a comfortable place where it is easy to fall asleep: ? Put up shades or special blackout curtains to block light from outside. ? Use a white noise machine to block noise. ? Keep the temperature cool.  Exercise regularly as directed by your health care provider. Avoid exercising right before bedtime.  Use relaxation  techniques to manage stress. Ask your health care provider to suggest some techniques that may work well for you. These may include: ? Breathing exercises. ? Routines to release muscle tension. ? Visualizing peaceful scenes.  Cut back on alcohol, caffeinated beverages, and cigarettes, especially close to bedtime. These can disrupt your sleep.  Do not overeat  or eat spicy foods right before bedtime. This can lead to digestive discomfort that can make it hard for you to sleep.  Limit screen use before bedtime. This includes: ? Watching TV. ? Using your smartphone, tablet, and computer.  Stick to a routine. This can help you fall asleep faster. Try to do a quiet activity, brush your teeth, and go to bed at the same time each night.  Get out of bed if you are still awake after 15 minutes of trying to sleep. Keep the lights down, but try reading or doing a quiet activity. When you feel sleepy, go back to bed.  Make sure that you drive carefully. Avoid driving if you feel very sleepy.  Keep all follow-up appointments as directed by your health care provider. This is important. Contact a health care provider if:  You are tired throughout the day or have trouble in your daily routine due to sleepiness.  You continue to have sleep problems or your sleep problems get worse. Get help right away if:  You have serious thoughts about hurting yourself or someone else. This information is not intended to replace advice given to you by your health care provider. Make sure you discuss any questions you have with your health care provider. Document Released: 12/08/2000 Document Revised: 05/12/2016 Document Reviewed: 09/11/2014 Elsevier Interactive Patient Education  2018 [place] Eating Plan DASH stands for "Dietary Approaches to Stop Hypertension." The DASH eating plan is a healthy eating plan that has been shown to reduce high blood pressure (hypertension). It may also reduce your risk  for type 2 diabetes, heart disease, and stroke. The DASH eating plan may also help with weight loss. What are tips for following this plan? General guidelines  Avoid eating more than 2,300 mg (milligrams) of salt (sodium) a day. If you have hypertension, you may need to reduce your sodium intake to 1,500 mg a day.  Limit alcohol intake to no more than 1 drink a day for nonpregnant women and 2 drinks a day for men. One drink equals 12 oz of beer, 5 oz of wine, or 1 oz of hard liquor.  Work with your health care provider to maintain a healthy body weight or to lose weight. Ask what an ideal weight is for you.  Get at least 30 minutes of exercise that causes your heart to beat faster (aerobic exercise) most days of the week. Activities may include walking, swimming, or biking.  Work with your health care provider or diet and nutrition specialist (dietitian) to adjust your eating plan to your individual calorie needs. Reading food labels  Check food labels for the amount of sodium per serving. Choose foods with less than 5 percent of the Daily Value of sodium. Generally, foods with less than 300 mg of sodium per serving fit into this eating plan.  To find whole grains, look for the word "whole" as the first word in the ingredient list. Shopping  Buy products labeled as "low-sodium" or "no salt added."  Buy fresh foods. Avoid canned foods and premade or frozen meals. Cooking  Avoid adding salt when cooking. Use salt-free seasonings or herbs instead of table salt or sea salt. Check with your health care provider or pharmacist before using salt substitutes.  Do not fry foods. Cook foods using healthy methods such as baking, boiling, grilling, and broiling instead.  Cook with heart-healthy oils, such as olive, canola, soybean, or sunflower oil. Meal planning   Eat a balanced diet  that includes: ? 5 or more servings of fruits and vegetables each day. At each meal, try to fill half of your  plate with fruits and vegetables. ? Up to 6-8 servings of whole grains each day. ? Less than 6 oz of lean meat, poultry, or fish each day. A 3-oz serving of meat is about the same size as a deck of cards. One egg equals 1 oz. ? 2 servings of low-fat dairy each day. ? A serving of nuts, seeds, or beans 5 times each week. ? Heart-healthy fats. Healthy fats called Omega-3 fatty acids are found in foods such as flaxseeds and coldwater fish, like sardines, salmon, and mackerel.  Limit how much you eat of the following: ? Canned or prepackaged foods. ? Food that is high in trans fat, such as fried foods. ? Food that is high in saturated fat, such as fatty meat. ? Sweets, desserts, sugary drinks, and other foods with added sugar. ? Full-fat dairy products.  Do not salt foods before eating.  Try to eat at least 2 vegetarian meals each week.  Eat more home-cooked food and less restaurant, buffet, and fast food.  When eating at a restaurant, ask that your food be prepared with less salt or no salt, if possible. What foods are recommended? The items listed may not be a complete list. Talk with your dietitian about what dietary choices are best for you. Grains Whole-grain or whole-wheat bread. Whole-grain or whole-wheat pasta. Brown rice. [place]. Bulgur. Whole-grain and low-sodium cereals. Pita bread. Low-fat, low-sodium crackers. Whole-wheat flour tortillas. Vegetables Fresh or frozen vegetables (raw, steamed, roasted, or grilled). Low-sodium or reduced-sodium tomato and vegetable juice. Low-sodium or reduced-sodium tomato sauce and tomato paste. Low-sodium or reduced-sodium canned vegetables. Fruits All fresh, dried, or frozen fruit. Canned fruit in natural juice (without added sugar). Meat and other protein foods Skinless chicken or [place]. Ground chicken or [place]. Pork with fat trimmed off. Fish and seafood. Egg whites. Dried beans, peas, or lentils. Unsalted nuts, nut butters, and  seeds. Unsalted canned beans. Lean cuts of beef with fat trimmed off. Low-sodium, lean deli meat. Dairy Low-fat (1%) or fat-free (skim) milk. Fat-free, low-fat, or reduced-fat cheeses. Nonfat, low-sodium ricotta or cottage cheese. Low-fat or nonfat yogurt. Low-fat, low-sodium cheese. Fats and oils Soft margarine without trans fats. Vegetable oil. Low-fat, reduced-fat, or light mayonnaise and salad dressings (reduced-sodium). Canola, safflower, olive, soybean, and sunflower oils. Avocado. Seasoning and other foods Herbs. Spices. Seasoning mixes without salt. Unsalted popcorn and pretzels. Fat-free sweets. What foods are not recommended? The items listed may not be a complete list. Talk with your dietitian about what dietary choices are best for you. Grains Baked goods made with fat, such as croissants, muffins, or some breads. Dry pasta or rice meal packs. Vegetables Creamed or fried vegetables. Vegetables in a cheese sauce. Regular canned vegetables (not low-sodium or reduced-sodium). Regular canned tomato sauce and paste (not low-sodium or reduced-sodium). Regular tomato and vegetable juice (not low-sodium or reduced-sodium). Angie Fava. Olives. Fruits Canned fruit in a light or heavy syrup. Fried fruit. Fruit in cream or butter sauce. Meat and other protein foods Fatty cuts of meat. Ribs. Fried meat. Berniece Salines. Sausage. Bologna and other processed lunch meats. Salami. Fatback. Hotdogs. Bratwurst. Salted nuts and seeds. Canned beans with added salt. Canned or smoked fish. Whole eggs or egg yolks. Chicken or [place] with skin. Dairy Whole or 2% milk, cream, and half-and-half. Whole or full-fat cream cheese. Whole-fat or sweetened yogurt. Full-fat cheese. Nondairy creamers.  Whipped toppings. Processed cheese and cheese spreads. Fats and oils Butter. Stick margarine. Lard. Shortening. Ghee. Bacon fat. Tropical oils, such as coconut, palm kernel, or palm oil. Seasoning and other foods Salted popcorn and  pretzels. Onion salt, garlic salt, seasoned salt, table salt, and sea salt. Worcestershire sauce. Tartar sauce. Barbecue sauce. Teriyaki sauce. Soy sauce, including reduced-sodium. Steak sauce. Canned and packaged gravies. Fish sauce. Oyster sauce. Cocktail sauce. Horseradish that you find on the shelf. Ketchup. Mustard. Meat flavorings and tenderizers. Bouillon cubes. Hot sauce and Tabasco sauce. Premade or packaged marinades. Premade or packaged taco seasonings. Relishes. Regular salad dressings. Where to find more information:  National Heart, Lung, and [place]: https://wilson-eaton.com/  American Heart Association: www.heart.org Summary  The DASH eating plan is a healthy eating plan that has been shown to reduce high blood pressure (hypertension). It may also reduce your risk for type 2 diabetes, heart disease, and stroke.  With the DASH eating plan, you should limit salt (sodium) intake to 2,300 mg a day. If you have hypertension, you may need to reduce your sodium intake to 1,500 mg a day.  When on the DASH eating plan, aim to eat more fresh fruits and vegetables, whole grains, lean proteins, low-fat dairy, and heart-healthy fats.  Work with your health care provider or diet and nutrition specialist (dietitian) to adjust your eating plan to your individual calorie needs. This information is not intended to replace advice given to you by your health care provider. Make sure you discuss any questions you have with your health care provider. Document Released: 11/30/2011 Document Revised: 12/04/2016 Document Reviewed: 12/04/2016 Elsevier Interactive Patient Education  2017 [employer].

## 2017-06-19 NOTE — Assessment & Plan Note (Signed)
Very poorly controlled- will send him for sleep study, thinking that he may be sleeping and not realizing it. Await sleep study and sleep medicine referral. Call with any concerns.
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
## 2017-06-20 ENCOUNTER — Telehealth: Payer: Self-pay | Admitting: Family Medicine

## 2017-06-20 DIAGNOSIS — R7989 Other specified abnormal findings of blood chemistry: Secondary | ICD-10-CM

## 2017-06-20 DIAGNOSIS — E559 Vitamin D deficiency, unspecified: Secondary | ICD-10-CM

## 2017-06-20 LAB — COMPREHENSIVE METABOLIC PANEL
ALT: 43 IU/L (ref 0–44)
AST: 26 IU/L (ref 0–40)
Albumin/Globulin Ratio: 1.5 (ref 1.2–2.2)
Albumin: 4.2 g/dL (ref 3.5–5.5)
Alkaline Phosphatase: 215 IU/L — ABNORMAL HIGH (ref 39–117)
BUN/Creatinine Ratio: 13 (ref 9–20)
BUN: 11 mg/dL (ref 6–24)
Bilirubin Total: 0.2 mg/dL (ref 0.0–1.2)
CO2: 20 mmol/L (ref 20–29)
Calcium: 8.9 mg/dL (ref 8.7–10.2)
Chloride: 103 mmol/L (ref 96–106)
Creatinine, Ser: 0.84 mg/dL (ref 0.76–1.27)
GFR calc Af Amer: 121 mL/min/{1.73_m2} (ref >59)
GFR calc non Af Amer: 105 mL/min/{1.73_m2} (ref >59)
Globulin, Total: 2.8 g/dL (ref 1.5–4.5)
Glucose: 86 mg/dL (ref 65–99)
Potassium: 4.5 mmol/L (ref 3.5–5.2)
Sodium: 140 mmol/L (ref 134–144)
Total Protein: 7 g/dL (ref 6.0–8.5)

## 2017-06-20 LAB — CBC WITH DIFFERENTIAL/PLATELET
Basophils Absolute: 0 10*3/uL (ref 0.0–0.2)
Basos: 0 %
EOS (ABSOLUTE): 0 10*3/uL (ref 0.0–0.4)
Eos: 0 %
Hematocrit: 45.4 % (ref 37.5–51.0)
Hemoglobin: 15.4 g/dL (ref 13.0–17.7)
Immature Grans (Abs): 0 10*3/uL (ref 0.0–0.1)
Immature Granulocytes: 0 %
Lymphocytes Absolute: 1.6 10*3/uL (ref 0.7–3.1)
Lymphs: 21 %
MCH: 33.2 pg — ABNORMAL HIGH (ref 26.6–33.0)
MCHC: 33.9 g/dL (ref 31.5–35.7)
MCV: 98 fL — ABNORMAL HIGH (ref 79–97)
Monocytes Absolute: 0.5 10*3/uL (ref 0.1–0.9)
Monocytes: 6 %
Neutrophils Absolute: 5.6 10*3/uL (ref 1.4–7.0)
Neutrophils: 73 %
Platelets: 242 10*3/uL (ref 150–379)
RBC: 4.64 x10E6/uL (ref 4.14–5.80)
RDW: 14.6 % (ref 12.3–15.4)
WBC: 7.7 10*3/uL (ref 3.4–10.8)

## 2017-06-20 LAB — THYROID PANEL WITH TSH
Free Thyroxine Index: 2 (ref 1.2–4.9)
T3 Uptake Ratio: 29 % (ref 24–39)
T4, Total: 6.8 ug/dL (ref 4.5–12.0)
TSH: 0.401 u[IU]/mL — ABNORMAL LOW (ref 0.450–4.500)

## 2017-06-20 LAB — VITAMIN D 25 HYDROXY (VIT D DEFICIENCY, FRACTURES): Vit D, 25-Hydroxy: 19.6 ng/mL — ABNORMAL LOW (ref 30.0–100.0)

## 2017-06-20 LAB — LIPID PANEL W/O CHOL/HDL RATIO
Cholesterol, Total: 145 mg/dL (ref 100–199)
HDL: 65 mg/dL (ref >39)
LDL Calculated: 67 mg/dL (ref 0–99)
Triglycerides: 64 mg/dL (ref 0–149)
VLDL Cholesterol Cal: 13 mg/dL (ref 5–40)

## 2017-06-20 MED ORDER — VITAMIN D (ERGOCALCIFEROL) 1.25 MG (50000 UNIT) PO CAPS: 50000.0000 [IU] | ORAL_CAPSULE | ORAL | 0 refills | Status: DC

## 2017-06-20 NOTE — Telephone Encounter (Signed)
Called patient with his results. Low vit D- will treat. Slightly overactive thyroid- will recheck in 1 month at follow up. Rest of labs normal.

## 2017-06-22 ENCOUNTER — Telehealth: Payer: Self-pay | Admitting: Family Medicine

## 2017-06-22 MED ORDER — IBUPROFEN 800 MG PO TABS: 800.0000 mg | ORAL_TABLET | Freq: Three times a day (TID) | ORAL | 1 refills | Status: DC | PRN

## 2017-06-22 NOTE — Telephone Encounter (Signed)
Patient called to see if Dr Wynetta Emery would call him in some Ibuprofen 800 mg to take for his pain in arm.  I explained that Dr Wynetta Emery was away but I would send to see if another provider would be willing to call this in. He is requesting a script because he can get the Ibuprofen with a script cheaper since he has medicaid.  [facility] [phone]

## 2017-06-22 NOTE — Telephone Encounter (Signed)
Routing to provider. I don't see that we've ever prescribed him Ibuprofen 800mg .

## 2017-06-22 NOTE — Telephone Encounter (Signed)
Rx sent 

## 2017-07-04 ENCOUNTER — Telehealth: Payer: Self-pay | Admitting: Family Medicine

## 2017-07-04 DIAGNOSIS — M25531 Pain in right wrist: Secondary | ICD-10-CM

## 2017-07-05 ENCOUNTER — Emergency Department
Admission: EM | Admit: 2017-07-05 | Discharge: 2017-07-05 | Disposition: A | Discharge status: Home / Self Care | Payer: Medicaid Other | Attending: Emergency Medicine | Admitting: Emergency Medicine

## 2017-07-05 ENCOUNTER — Telehealth: Payer: Self-pay | Admitting: Family Medicine

## 2017-07-05 ENCOUNTER — Encounter: Payer: Self-pay | Admitting: Emergency Medicine

## 2017-07-05 DIAGNOSIS — Z91040 Latex allergy status: Secondary | ICD-10-CM | POA: Insufficient documentation

## 2017-07-05 DIAGNOSIS — Z96651 Presence of right artificial knee joint: Secondary | ICD-10-CM | POA: Diagnosis not present

## 2017-07-05 DIAGNOSIS — F17210 Nicotine dependence, cigarettes, uncomplicated: Secondary | ICD-10-CM | POA: Insufficient documentation

## 2017-07-05 DIAGNOSIS — Z79899 Other long term (current) drug therapy: Secondary | ICD-10-CM | POA: Diagnosis not present

## 2017-07-05 DIAGNOSIS — W292XXA Contact with other powered household machinery, initial encounter: Secondary | ICD-10-CM | POA: Diagnosis not present

## 2017-07-05 DIAGNOSIS — S60943A Unspecified superficial injury of left middle finger, initial encounter: Secondary | ICD-10-CM | POA: Diagnosis present

## 2017-07-05 DIAGNOSIS — S61312A Laceration without foreign body of right middle finger with damage to nail, initial encounter: Secondary | ICD-10-CM

## 2017-07-05 DIAGNOSIS — Y929 Unspecified place or not applicable: Secondary | ICD-10-CM | POA: Insufficient documentation

## 2017-07-05 DIAGNOSIS — Y9389 Activity, other specified: Secondary | ICD-10-CM | POA: Diagnosis not present

## 2017-07-05 DIAGNOSIS — Y998 Other external cause status: Secondary | ICD-10-CM | POA: Diagnosis not present

## 2017-07-05 MED ORDER — CLINDAMYCIN HCL 300 MG PO CAPS: 300.0000 mg | ORAL_CAPSULE | Freq: Three times a day (TID) | ORAL | 0 refills | Status: DC

## 2017-07-05 MED ORDER — GABAPENTIN 100 MG PO CAPS: 100.0000 mg | ORAL_CAPSULE | Freq: Three times a day (TID) | ORAL | 0 refills | Status: DC

## 2017-07-05 MED ORDER — LIDOCAINE HCL 1 % IJ SOLN
5.0000 mL | Freq: Once | INTRAMUSCULAR | Status: DC
  Filled 2017-07-05: qty 5

## 2017-07-05 MED ORDER — OXYCODONE-ACETAMINOPHEN 5-325 MG PO TABS: 1.0000 | ORAL_TABLET | ORAL | 0 refills | Status: DC | PRN

## 2017-07-05 MED ORDER — LIDOCAINE HCL (PF) 1 % IJ SOLN
INTRAMUSCULAR | Status: AC
  Filled 2017-07-05: qty 5

## 2017-07-05 NOTE — ED Triage Notes (Signed)
Pt in via POV with laceration to tip of right middle finger, bleeding controlled at this time.  Pt reports cleaning leaves out of a window AC unit, catching finger on running fan.  Pt ambulatory to room, NAD noted at this time.

## 2017-07-05 NOTE — Telephone Encounter (Signed)
Patient would like a medication for gabapenten and also a referral to an orthopedic due to pain in his wrist.  Please Advise.  Thank you

## 2017-07-05 NOTE — ED Notes (Signed)
Betadine and NS solution prepared; pt soaking finger now.

## 2017-07-05 NOTE — ED Provider Notes (Signed)
[facility] Emergency Department Provider Note  ____________________________________________  Time seen: Approximately 4:04 PM  I have reviewed the triage vital signs and the nursing notes.   HISTORY  Chief Complaint Laceration    HPI Johnny [name]. is a 46 y.o. male that presents to the emergency department withright middle finger laceration. Patient was cleaning leaves out of an outdoor window [facility] unit when his hand got caught on a blade. Last tetanus shot was 3 years ago. No additional injuries. Patient is allergic to Bactrim and penicillin. He denies shortness of breath, chest pain, nausea, vomiting, abdominal pain.   Past Medical History:  Diagnosis Date  . Bilateral sciatica   . Carpal tunnel syndrome   . Chronic back pain   . Chronic neck pain   . Chronic pain syndrome   . Herniated disc   . Traumatic amputation of left thumb 2013    Patient Active Problem List   Diagnosis Date Noted  . Vitamin D deficiency 06/20/2017  . Insomnia 06/19/2017  . Bilateral sciatica     Past Surgical History:  Procedure Laterality Date  . AMPUTATION FINGER / THUMB Left 2013  . HERNIA REPAIR    . HIP SURGERY    . JOINT REPLACEMENT Right    knee  . KNEE SURGERY    . neck fusion    . REPLACEMENT TOTAL KNEE Right   . SHOULDER SURGERY Right 2016 X 2  . SPINAL FUSION    . TONSILLECTOMY      Prior to Admission medications   Medication Sig Start Date End Date Taking? Authorizing Provider  clindamycin (CLEOCIN) 300 MG capsule Take 1 capsule (300 mg total) by mouth 3 (three) times daily. 07/05/17 07/15/17  Laban Emperor, PA-C  gabapentin (NEURONTIN) 100 MG capsule Take 1 capsule (100 mg total) by mouth 3 (three) times daily. 07/05/17   Johnson, Megan P, DO  ibuprofen (ADVIL,MOTRIN) 800 MG tablet Take 1 tablet (800 mg total) by mouth every 8 (eight) hours as needed. 06/22/17   Volney American, PA-C  oxyCODONE-acetaminophen (PERCOCET) 5-325 MG tablet Take  1 tablet by mouth every 4 (four) hours as needed for severe pain. 07/05/17   Laban Emperor, PA-C  Vitamin D, Ergocalciferol, (DRISDOL) 50000 units CAPS capsule Take 1 capsule (50,000 Units total) by mouth every 7 (seven) days. 06/20/17   Park Liter P, DO    Allergies Flexeril [cyclobenzaprine]; Codeine; Latex; Tramadol; and Sulfamethoxazole-trimethoprim  Family History  Problem Relation Age of Onset  . Cancer Father        sarcoma  . Cirrhosis Paternal Grandmother   . Cancer Paternal Grandfather        Pancreatic  . Fibromyalgia Sister   . Deafness Sister     Social History Social History  Substance Use Topics  . Smoking status: Current Every Day Smoker    Packs/day: 0.50    Types: Cigarettes  . Smokeless tobacco: Never Used  . Alcohol use No     Review of Systems  Constitutional: No fever/chills Cardiovascular: No chest pain. Respiratory: No SOB. Gastrointestinal: No abdominal pain.  No nausea, no vomiting.  Musculoskeletal: Positive for finger pain. Skin: Negative for rash, ecchymosis. Positive for laceration. Neurological: Negative for headaches, numbness or tingling   ____________________________________________   PHYSICAL EXAM:  VITAL SIGNS: ED Triage Vitals  Enc Vitals Group     BP 07/05/17 1545 (!) 146/111     Pulse Rate 07/05/17 1545 (!) 109     Resp 07/05/17 1545  18     Temp 07/05/17 1545 98.6 F (37 C)     Temp Source 07/05/17 1545 Oral     SpO2 07/05/17 1545 97 %     Weight 07/05/17 1545 180 lb (81.6 kg)     Height 07/05/17 1545 6' (1.829 m)     Head Circumference --      Peak Flow --      Pain Score 07/05/17 1550 8     Pain Loc --      Pain Edu? --      Excl. in [place]? --      Constitutional: Alert and oriented. Well appearing and in no acute distress. Eyes: Conjunctivae are normal. PERRL. EOMI. Head: Atraumatic. ENT:      Ears:      Nose: No congestion/rhinnorhea.      Mouth/Throat: Mucous membranes are moist.  Neck: No stridor.    Cardiovascular: Normal rate, regular rhythm.  Good peripheral circulation. Respiratory: Normal respiratory effort without tachypnea or retractions. Lungs CTAB. Good air entry to the bases with no decreased or absent breath sounds. Musculoskeletal: Full range of motion to all extremities. No gross deformities appreciated. Neurologic:  Normal speech and language. No gross focal neurologic deficits are appreciated.  Skin:  Skin is warm, dry. 1 cm laceration to tip of right middle finger. Psychiatric: Mood and affect are normal. Speech and behavior are normal. Patient exhibits appropriate insight and judgement.   ____________________________________________   LABS (all labs ordered are listed, but only abnormal results are displayed)  Labs Reviewed - No data to display ____________________________________________  EKG   ____________________________________________  RADIOLOGY  No results found.  ____________________________________________    PROCEDURES  Procedure(s) performed:    Procedures  LACERATION REPAIR Performed by: Denton Ar, PA-S  Consent: Verbal consent obtained.  Consent given by: patient  Prepped and Draped in normal sterile fashion  Wound explored: No foreign bodies   Laceration Location: Right middle finger  Laceration Length: 1 m  Anesthesia: None  Local anesthetic: lidocaine 1% without epinephrine  Anesthetic total: 4 ml  Irrigation method: syringe  Amount of cleaning: 57ml normal saline  Skin closure: 4-0 nylon  Number of sutures: 5  Technique: Simple interrupted  Patient tolerance: Patient tolerated the procedure well with no immediate complications.  Medications - No data to display   ____________________________________________   INITIAL IMPRESSION / ASSESSMENT AND PLAN / ED COURSE  Pertinent labs & imaging results that were available during my care of the patient were reviewed by me and considered in my medical decision  making (see chart for details).  Review of the [place] CSRS was performed in accordance of the [place] prior to dispensing any controlled drugs.     Patient's diagnosis is consistent with finger laceration. Vital signs and exam are reassuring. Laceration was repaired by Denton Ar, PA-S. Finger was wrapped and splint was applied. Patient will be discharged home with prescriptions for clindamycin. Patient is to follow up with PCP as directed. Patient is given ED precautions to return to the ED for any worsening or new symptoms.     ____________________________________________  FINAL CLINICAL IMPRESSION(S) / ED DIAGNOSES  Final diagnoses:  Laceration of right middle finger without foreign body with damage to nail, initial encounter      NEW MEDICATIONS STARTED DURING THIS VISIT:  Discharge Medication List as of 07/05/2017  5:30 PM    START taking these medications   Details  clindamycin (CLEOCIN) 300 MG capsule Take 1 capsule (300 mg total) by  mouth 3 (three) times daily., Starting Thu 07/05/2017, Until Sun 07/15/2017, Print    oxyCODONE-acetaminophen (PERCOCET) 5-325 MG tablet Take 1 tablet by mouth every 4 (four) hours as needed for severe pain., Starting Thu 07/05/2017, Print            This chart was dictated using voice recognition software/Dragon. Despite best efforts to proofread, errors can occur which can change the meaning. Any change was purely unintentional.    Laban Emperor, PA-C 07/05/17 2047    Nena Polio, MD 07/05/17 2352

## 2017-07-05 NOTE — Telephone Encounter (Signed)
Referral has been submitted to [facility]

## 2017-07-05 NOTE — Telephone Encounter (Signed)
Patient asked if he could get an orthopedic closer to [facility] or [place], [place] for his wrist.  Please Advise.   Thank you

## 2017-07-06 ENCOUNTER — Telehealth: Payer: Self-pay | Admitting: Family Medicine

## 2017-07-06 NOTE — Telephone Encounter (Signed)
Patient called again to see if someone give some some pain medication or if necessary would see him today to get pain med. He states his finger is throbbing.  Thanks

## 2017-07-06 NOTE — Telephone Encounter (Signed)
Not comfortable prescribing more, can take 800 mg ibuprofen prn for pain relief and f/u with UC or ER if severely worsening over weekend

## 2017-07-06 NOTE — Telephone Encounter (Signed)
Routing to provider  

## 2017-07-06 NOTE — Telephone Encounter (Signed)
Informed patient that we could not prescribe any more pain meds. He said he was taking the Ibuprofen 800mg  and it wasn't helping any. He was very insistent that he had a "real injury" and that he needed pain meds. I apologized and told him if his pain was that severe, he'd need to go to UC or the ED. He said he'd just go back to the ED since we wouldn't prescribe him anything.

## 2017-07-06 NOTE — Telephone Encounter (Signed)
Patient cut end of finger almost completely off and Dr Wynetta Emery referred him to the ER. He went 7/12 and received 6 stitches but was only given 4 pain pills.  He has taken his last one today and is hoping Dr Wynetta Emery or someone would write a script for a few more to get him through the worse part.  I explained Dr Wynetta Emery not here today but I would send this information to someone to see if this could be done.  He said he is willing to come in to the office for an appt if he it is required to get the med.  Thanks   [phone]

## 2017-07-09 ENCOUNTER — Ambulatory Visit (INDEPENDENT_AMBULATORY_CARE_PROVIDER_SITE_OTHER): Payer: Self-pay | Admitting: Orthopaedic Surgery

## 2017-07-13 ENCOUNTER — Encounter: Payer: Self-pay | Admitting: Family Medicine

## 2017-07-13 ENCOUNTER — Ambulatory Visit (INDEPENDENT_AMBULATORY_CARE_PROVIDER_SITE_OTHER): Payer: Medicaid Other | Admitting: Family Medicine

## 2017-07-13 VITALS — BP 150/98 | HR 84 | Temp 98.0°F | Ht 68.0 in | Wt 183.0 lb

## 2017-07-13 DIAGNOSIS — S61210D Laceration without foreign body of right index finger without damage to nail, subsequent encounter: Secondary | ICD-10-CM | POA: Diagnosis not present

## 2017-07-13 DIAGNOSIS — G4700 Insomnia, unspecified: Secondary | ICD-10-CM

## 2017-07-13 DIAGNOSIS — M545 Low back pain: Secondary | ICD-10-CM

## 2017-07-13 DIAGNOSIS — G8929 Other chronic pain: Secondary | ICD-10-CM

## 2017-07-13 DIAGNOSIS — Z4802 Encounter for removal of sutures: Secondary | ICD-10-CM | POA: Diagnosis not present

## 2017-07-13 MED ORDER — DOXYCYCLINE HYCLATE 100 MG PO TABS: 100.0000 mg | ORAL_TABLET | Freq: Two times a day (BID) | ORAL | 0 refills | Status: DC

## 2017-07-13 MED ORDER — GABAPENTIN 300 MG PO CAPS: 300.0000 mg | ORAL_CAPSULE | Freq: Three times a day (TID) | ORAL | 1 refills | Status: DC

## 2017-07-13 NOTE — Patient Instructions (Signed)
Epsom salt soaks, neosporin, compresses

## 2017-07-13 NOTE — Assessment & Plan Note (Addendum)
Suspect the increased gabapentin dose will help with sleep. Can try some melatonin as well. Has upcoming sleep study, await results.

## 2017-07-13 NOTE — Progress Notes (Signed)
BP (!) 150/98 (BP Location: Right Arm, Patient Position: Sitting, Cuff Size: Normal)   Pulse 84   Temp 98 F (36.7 C)   Ht 5\' 8"  (1.727 m)   Wt 183 lb (83 kg)   SpO2 96%   BMI 27.83 kg/m    Subjective:    Patient ID: Johnny [name]., male    DOB: 22-Dec-1971, 46 y.o.   MRN: [id number]  HPI: Johnny Grzywacz. is a 46 y.o. male  Chief Complaint  Patient presents with  . Suture / Staple Removal  . Insomnia    Patient states he hasn't slept in 3 days  . Medication Management    Patient states he was on gabapentin 400mg  and now is on 100mg  and it isn't working.   Patient presents today for suture removal for a right index finger lac. Sutures were placed in the ER, 6 simple interrupted. Was given 4 percocets and states his pain has not been under good control with ibuprofen 800 mg since running out of percocet. Has been keeping finger covered with a splint and keeping it clean and dry. Denies fever, chills, redness, drainage from the area.   Also states he was previously on 400 mg gabapentin TID, now taking 100 mg TID and that it does not help at all. Wanting to increase dose for chronic pain control.   Having increasing difficulty sleeping. Upcoming sleep study, but states he has slept only about 2 hours in the past several days. Wanting something for sleep.   Past Medical History:  Diagnosis Date  . Bilateral sciatica   . Carpal tunnel syndrome   . Chronic back pain   . Chronic neck pain   . Chronic pain syndrome   . Herniated disc   . Traumatic amputation of left thumb 2013   Social History   Social History  . Marital status: Divorced    Spouse name: N/A  . Number of children: N/A  . Years of education: N/A   Occupational History  . Not on file.   Social History Main Topics  . Smoking status: Current Every Day Smoker    Packs/day: 0.50    Types: Cigarettes  . Smokeless tobacco: Never Used  . Alcohol use No  . Drug use: No  . Sexual activity: Yes   Other  Topics Concern  . Not on file   Social History Narrative  . No narrative on file   Relevant past medical, surgical, family and social history reviewed and updated as indicated. Interim medical history since our last visit reviewed. Allergies and medications reviewed and updated.  Review of Systems  Constitutional: Positive for fatigue.  Respiratory: Negative.   Cardiovascular: Negative.   Gastrointestinal: Negative.   Genitourinary: Negative.   Musculoskeletal: Positive for arthralgias and back pain.  Neurological: Negative.   Psychiatric/Behavioral: Positive for sleep disturbance.   Per HPI unless specifically indicated above     Objective:    BP (!) 150/98 (BP Location: Right Arm, Patient Position: Sitting, Cuff Size: Normal)   Pulse 84   Temp 98 F (36.7 C)   Ht 5\' 8"  (1.727 m)   Wt 183 lb (83 kg)   SpO2 96%   BMI 27.83 kg/m   Wt Readings from Last 3 Encounters:  07/13/17 183 lb (83 kg)  07/05/17 180 lb (81.6 kg)  06/19/17 181 lb 6 oz (82.3 kg)    Physical Exam  Constitutional: He is oriented to person, place, and time. He appears well-developed  and well-nourished.  HENT:  Head: Atraumatic.  Eyes: Pupils are equal, round, and reactive to light. Conjunctivae are normal.  Neck: Normal range of motion. Neck supple.  Cardiovascular: Normal rate and normal heart sounds.   Pulmonary/Chest: Effort normal and breath sounds normal. No respiratory distress.  Musculoskeletal: Normal range of motion. He exhibits tenderness (TTP over right index finger lac). He exhibits no edema.  Neurological: He is alert and oriented to person, place, and time.  Skin: Skin is warm and dry.  Well healing right index finger lac. No redness or edema. Some purulent drainage expressed after sutures were removed with minimal pressure to tip of finger  Psychiatric: Judgment and thought content normal.  Nursing note and vitals reviewed.     Assessment & Plan:   Problem List Items Addressed  This Visit      Other   Insomnia    Suspect the increased gabapentin dose will help with sleep. Can try some melatonin as well. Has upcoming sleep study, await results.        Other Visit Diagnoses    Laceration of right index finger without foreign body without damage to nail, subsequent encounter    -  Primary   Sutures removed without complication. Upon removal, fair amount of purulent drainage was expressed. Will start doxycycline, wound care discussed   Visit for suture removal       Chronic low back pain, unspecified back pain laterality, with sciatica presence unspecified       Increase to 300 mg TID. Continue ibuprofen prn. Precautions reviewed       Follow up plan: Return for as scheduled.

## 2017-07-19 ENCOUNTER — Encounter: Payer: Medicaid Other | Admitting: Family Medicine

## 2017-07-20 ENCOUNTER — Other Ambulatory Visit: Payer: Self-pay | Admitting: Orthopaedic Surgery

## 2017-07-20 DIAGNOSIS — M25531 Pain in right wrist: Secondary | ICD-10-CM

## 2017-07-24 ENCOUNTER — Telehealth: Payer: Self-pay | Admitting: Family Medicine

## 2017-07-24 NOTE — Telephone Encounter (Signed)
Sheena with [facility] stated pt had an appt for 8/1 but called to cancel the appt stating he did not want to have the sleep appt.  [facility] [phone] if you need to speak with them  Thanks

## 2017-07-24 NOTE — Telephone Encounter (Signed)
FYI

## 2017-07-25 ENCOUNTER — Institutional Professional Consult (permissible substitution): Payer: Self-pay | Admitting: Neurology

## 2017-07-26 ENCOUNTER — Ambulatory Visit
Admission: RE | Admit: 2017-07-26 | Discharge: 2017-07-26 | Disposition: A | Discharge status: Home / Self Care | Payer: Medicaid Other | Source: Ambulatory Visit | Attending: Orthopaedic Surgery | Admitting: Orthopaedic Surgery

## 2017-07-26 DIAGNOSIS — M85641 Other cyst of bone, right hand: Secondary | ICD-10-CM | POA: Diagnosis not present

## 2017-07-26 DIAGNOSIS — M25531 Pain in right wrist: Secondary | ICD-10-CM | POA: Diagnosis present

## 2017-07-26 DIAGNOSIS — S63591A Other specified sprain of right wrist, initial encounter: Secondary | ICD-10-CM | POA: Diagnosis not present

## 2017-07-26 DIAGNOSIS — Z8781 Personal history of (healed) traumatic fracture: Secondary | ICD-10-CM | POA: Diagnosis not present

## 2017-07-26 DIAGNOSIS — X58XXXA Exposure to other specified factors, initial encounter: Secondary | ICD-10-CM | POA: Diagnosis not present

## 2017-07-26 MED ORDER — GADOBENATE DIMEGLUMINE 529 MG/ML IV SOLN: 15.0000 mL | Freq: Once | INTRAVENOUS | Status: DC | PRN

## 2017-07-30 ENCOUNTER — Other Ambulatory Visit: Payer: Self-pay | Admitting: Orthopaedic Surgery

## 2017-07-30 DIAGNOSIS — M25531 Pain in right wrist: Secondary | ICD-10-CM

## 2017-07-31 ENCOUNTER — Telehealth: Payer: Self-pay | Admitting: Family Medicine

## 2017-07-31 NOTE — Telephone Encounter (Signed)
Please get patient scheduled for a visit to discuss increasing his gabapentin.

## 2017-07-31 NOTE — Telephone Encounter (Signed)
Patient asked if he can et a larger dosage of medication for gabapentin.Patient is taking 300 mg dosage and would like to see if he can get the 400 or 600 mg dosage due to the medication not helping too much.  Please Advise.  Thank you

## 2017-08-01 NOTE — Telephone Encounter (Signed)
Patient scheduled for 08/07/2017.

## 2017-08-07 ENCOUNTER — Ambulatory Visit (INDEPENDENT_AMBULATORY_CARE_PROVIDER_SITE_OTHER): Payer: Medicaid Other | Admitting: Family Medicine

## 2017-08-07 ENCOUNTER — Encounter: Payer: Self-pay | Admitting: Family Medicine

## 2017-08-07 VITALS — BP 132/90 | HR 100 | Temp 98.1°F | Wt 182.2 lb

## 2017-08-07 DIAGNOSIS — G894 Chronic pain syndrome: Secondary | ICD-10-CM | POA: Insufficient documentation

## 2017-08-07 DIAGNOSIS — M25511 Pain in right shoulder: Secondary | ICD-10-CM

## 2017-08-07 DIAGNOSIS — M25531 Pain in right wrist: Secondary | ICD-10-CM | POA: Diagnosis not present

## 2017-08-07 DIAGNOSIS — G8929 Other chronic pain: Secondary | ICD-10-CM | POA: Diagnosis not present

## 2017-08-07 DIAGNOSIS — M25561 Pain in right knee: Secondary | ICD-10-CM | POA: Diagnosis not present

## 2017-08-07 MED ORDER — GABAPENTIN 400 MG PO CAPS: 400.0000 mg | ORAL_CAPSULE | Freq: Three times a day (TID) | ORAL | 1 refills | Status: DC

## 2017-08-07 NOTE — Assessment & Plan Note (Signed)
Will increase his gabapentin to 400mg  TID. Referral to ortho for 2nd opinion. Referral to pain management. Call with any concerns or if not getting better or getting worse.

## 2017-08-07 NOTE — Progress Notes (Signed)
BP 132/90 (BP Location: Left Arm, Patient Position: Sitting, Cuff Size: Normal)   Pulse 100   Temp 98.1 F (36.7 C)   Wt 182 lb 4 oz (82.7 kg)   SpO2 97%   BMI 27.71 kg/m    Subjective:    Patient ID: Johnny Pronto., male    DOB: 1971-11-21, 46 y.o.   MRN: [id number]  HPI: Johnny Dacy. is a 46 y.o. male  Chief Complaint  Patient presents with  . Pain    Patient would like to have his gabapentin increased.   Marland Kitchen Referral    Pain clinic   Here today because he would like to have his gabapentin increased. He notes that he was previously on 400mg  TID. Was restarted on 100mg  TID and then about a month ago was increased to 300mg  TID. He notes that his 300mg  has not been doing great. He notes that his R knee and neck are the things that are bothering him. He states that his R shoulder has been cracking and popping. He was told in the past that he needed a shoulder replacement, but he doesn't want to do that. He notes his R wrist has also been hurting. Saw [facility] and they said that they wanted to do an injection on it. He refused. He notes that it is still hurting a whole lot and would like a 2nd opinion. Also notes that he has has surgery on his R knee before, and now it is cracking and popping. He would like a 2nd opinion on that as well. He notes that he is always in a lot of pain. He doesn't want to drive to [place]. He would be happy to see someone in [place] or [place]. He also notes that his sleep is doing a bit better. He notes that his dad has been in and out of the hospital so he has been under a lot of stress. He is otherwise doing OK with no other concerns or complaints at this time.    Relevant past medical, surgical, family and social history reviewed and updated as indicated. Interim medical history since our last visit reviewed. Allergies and medications reviewed and updated.  Review of Systems  Constitutional: Negative.   Respiratory: Negative.     Cardiovascular: Negative.   Musculoskeletal: Positive for arthralgias, back pain, gait problem and myalgias. Negative for joint swelling, neck pain and neck stiffness.  Neurological: Negative for dizziness, tremors, seizures, syncope, facial asymmetry, speech difficulty, weakness, light-headedness, numbness and headaches.  Psychiatric/Behavioral: Negative.     Per HPI unless specifically indicated above     Objective:    BP 132/90 (BP Location: Left Arm, Patient Position: Sitting, Cuff Size: Normal)   Pulse 100   Temp 98.1 F (36.7 C)   Wt 182 lb 4 oz (82.7 kg)   SpO2 97%   BMI 27.71 kg/m   Wt Readings from Last 3 Encounters:  08/07/17 182 lb 4 oz (82.7 kg)  07/13/17 183 lb (83 kg)  07/05/17 180 lb (81.6 kg)    Physical Exam  Constitutional: He is oriented to person, place, and time. He appears well-developed and well-nourished. No distress.  HENT:  Head: Normocephalic and atraumatic.  Right Ear: Hearing normal.  Left Ear: Hearing normal.  Nose: Nose normal.  Eyes: Conjunctivae and lids are normal. Right eye exhibits no discharge. Left eye exhibits no discharge. No scleral icterus.  Cardiovascular: Normal rate, regular rhythm, normal heart sounds and intact distal pulses.  Exam reveals no gallop and no friction rub.   No murmur heard. Pulmonary/Chest: Effort normal and breath sounds normal. No respiratory distress. He has no wheezes. He has no rales. He exhibits no tenderness.  Musculoskeletal: Normal range of motion.  Neurological: He is alert and oriented to person, place, and time.  Skin: Skin is intact. No rash noted. He is not diaphoretic.  Psychiatric: He has a normal mood and affect. His speech is normal and behavior is normal. Judgment and thought content normal. Cognition and memory are normal.  Nursing note and vitals reviewed.   Results for orders placed or performed in visit on 06/19/17  Microscopic Examination  Result Value Ref Range   RBC, UA 0-2 0 - 2  /hpf   Epithelial Cells (non renal) 0-10 0 - 10 /hpf  CBC with Differential/Platelet  Result Value Ref Range   WBC 7.7 3.4 - 10.8 x10E3/uL   RBC 4.64 4.14 - 5.80 x10E6/uL   Hemoglobin 15.4 13.0 - 17.7 g/dL   Hematocrit 45.4 37.5 - 51.0 %   MCV 98 (H) 79 - 97 fL   MCH 33.2 (H) 26.6 - 33.0 pg   MCHC 33.9 31.5 - 35.7 g/dL   RDW 14.6 12.3 - 15.4 %   Platelets 242 150 - 379 x10E3/uL   Neutrophils 73 Not Estab. %   Lymphs 21 Not Estab. %   Monocytes 6 Not Estab. %   Eos 0 Not Estab. %   Basos 0 Not Estab. %   Neutrophils Absolute 5.6 1.4 - 7.0 x10E3/uL   Lymphocytes Absolute 1.6 0.7 - 3.1 x10E3/uL   Monocytes Absolute 0.5 0.1 - 0.9 x10E3/uL   EOS (ABSOLUTE) 0.0 0.0 - 0.4 x10E3/uL   Basophils Absolute 0.0 0.0 - 0.2 x10E3/uL   Immature Granulocytes 0 Not Estab. %   Immature Grans (Abs) 0.0 0.0 - 0.1 x10E3/uL  Comprehensive metabolic panel  Result Value Ref Range   Glucose 86 65 - 99 mg/dL   BUN 11 6 - 24 mg/dL   Creatinine, Ser 0.84 0.76 - 1.27 mg/dL   GFR calc non Af Amer 105 >59 mL/min/1.73   GFR calc Af Amer 121 >59 mL/min/1.73   BUN/Creatinine Ratio 13 9 - 20   Sodium 140 134 - 144 mmol/L   Potassium 4.5 3.5 - 5.2 mmol/L   Chloride 103 96 - 106 mmol/L   CO2 20 20 - 29 mmol/L   Calcium 8.9 8.7 - 10.2 mg/dL   Total Protein 7.0 6.0 - 8.5 g/dL   Albumin 4.2 3.5 - 5.5 g/dL   Globulin, Total 2.8 1.5 - 4.5 g/dL   Albumin/Globulin Ratio 1.5 1.2 - 2.2   Bilirubin Total 0.2 0.0 - 1.2 mg/dL   Alkaline Phosphatase 215 (H) 39 - 117 IU/L   AST 26 0 - 40 IU/L   ALT 43 0 - 44 IU/L  Lipid Panel w/o Chol/HDL Ratio  Result Value Ref Range   Cholesterol, Total 145 100 - 199 mg/dL   Triglycerides 64 0 - 149 mg/dL   HDL 65 >39 mg/dL   VLDL Cholesterol Cal 13 5 - 40 mg/dL   LDL Calculated 67 0 - 99 mg/dL  Microalbumin, Urine Waived  Result Value Ref Range   Microalb, Ur Waived 10 0 - 19 mg/L   Creatinine, Urine Waived 10 10 - 300 mg/dL   Microalb/Creat Ratio 30-300 (H) <30 mg/g   Thyroid Panel With TSH  Result Value Ref Range   TSH 0.401 (L) 0.450 - 4.500 uIU/mL  T4, Total 6.8 4.5 - 12.0 ug/dL   T3 Uptake Ratio 29 24 - 39 %   Free Thyroxine Index 2.0 1.2 - 4.9  UA/M w/rflx Culture, Routine  Result Value Ref Range   Specific Gravity, UA 1.010 1.005 - 1.030   pH, UA 6.5 5.0 - 7.5   Color, UA Yellow Yellow   Appearance Ur Clear Clear   Leukocytes, UA Negative Negative   Protein, UA Negative Negative/Trace   Glucose, UA Negative Negative   Ketones, UA Negative Negative   RBC, UA Trace (A) Negative   Bilirubin, UA Negative Negative   Urobilinogen, Ur 0.2 0.2 - 1.0 mg/dL   Nitrite, UA Negative Negative   Microscopic Examination See below:   VITAMIN D 25 Hydroxy (Vit-D Deficiency, Fractures)  Result Value Ref Range   Vit D, 25-Hydroxy 19.6 (L) 30.0 - 100.0 ng/mL      Assessment & Plan:   Problem List Items Addressed This Visit      Other   Chronic pain syndrome - Primary    Will increase his gabapentin to 400mg  TID. Referral to ortho for 2nd opinion. Referral to pain management. Call with any concerns or if not getting better or getting worse.       Relevant Orders   Ambulatory referral to Pain Clinic    Other Visit Diagnoses    Chronic right shoulder pain       He states that it is not getting better. Doesn't want shoulder replacement. Would like 2nd opinion from ortho. Referral set up today.    Relevant Medications   gabapentin (NEURONTIN) 400 MG capsule   Other Relevant Orders   Ambulatory referral to Orthopedic Surgery   Right wrist pain       He states that it is not getting better. Doesn't wantwrist injections. Would like 2nd opinion from ortho. Referral set up today.    Relevant Orders   Ambulatory referral to Orthopedic Surgery   Chronic pain of right knee       He states that it is not getting better. Thinks that he needs a knee replacement. Would like 2nd opinion from ortho. Referral set up today.    Relevant Orders   Ambulatory  referral to Orthopedic Surgery       Follow up plan: Return 3-4 weeks, for physical.

## 2017-08-09 ENCOUNTER — Encounter: Payer: Self-pay | Admitting: Student in an Organized Health Care Education/Training Program

## 2017-08-09 ENCOUNTER — Ambulatory Visit
Payer: Medicaid Other | Attending: Student in an Organized Health Care Education/Training Program | Admitting: Student in an Organized Health Care Education/Training Program

## 2017-08-09 VITALS — BP 161/104 | HR 83 | Temp 98.4°F | Resp 18 | Ht 72.0 in | Wt 182.0 lb

## 2017-08-09 DIAGNOSIS — M5126 Other intervertebral disc displacement, lumbar region: Secondary | ICD-10-CM | POA: Diagnosis not present

## 2017-08-09 DIAGNOSIS — M25561 Pain in right knee: Secondary | ICD-10-CM

## 2017-08-09 DIAGNOSIS — Z789 Other specified health status: Secondary | ICD-10-CM

## 2017-08-09 DIAGNOSIS — Z9189 Other specified personal risk factors, not elsewhere classified: Secondary | ICD-10-CM

## 2017-08-09 DIAGNOSIS — M5442 Lumbago with sciatica, left side: Secondary | ICD-10-CM | POA: Diagnosis not present

## 2017-08-09 DIAGNOSIS — Z885 Allergy status to narcotic agent status: Secondary | ICD-10-CM | POA: Insufficient documentation

## 2017-08-09 DIAGNOSIS — F17210 Nicotine dependence, cigarettes, uncomplicated: Secondary | ICD-10-CM | POA: Insufficient documentation

## 2017-08-09 DIAGNOSIS — Z981 Arthrodesis status: Secondary | ICD-10-CM

## 2017-08-09 DIAGNOSIS — M5136 Other intervertebral disc degeneration, lumbar region: Secondary | ICD-10-CM | POA: Diagnosis not present

## 2017-08-09 DIAGNOSIS — M5412 Radiculopathy, cervical region: Secondary | ICD-10-CM | POA: Insufficient documentation

## 2017-08-09 DIAGNOSIS — G8929 Other chronic pain: Secondary | ICD-10-CM | POA: Insufficient documentation

## 2017-08-09 DIAGNOSIS — Z888 Allergy status to other drugs, medicaments and biological substances status: Secondary | ICD-10-CM | POA: Insufficient documentation

## 2017-08-09 DIAGNOSIS — M5441 Lumbago with sciatica, right side: Secondary | ICD-10-CM | POA: Diagnosis not present

## 2017-08-09 DIAGNOSIS — Z9889 Other specified postprocedural states: Secondary | ICD-10-CM | POA: Diagnosis not present

## 2017-08-09 DIAGNOSIS — F329 Major depressive disorder, single episode, unspecified: Secondary | ICD-10-CM | POA: Insufficient documentation

## 2017-08-09 DIAGNOSIS — Z809 Family history of malignant neoplasm, unspecified: Secondary | ICD-10-CM | POA: Insufficient documentation

## 2017-08-09 DIAGNOSIS — M4322 Fusion of spine, cervical region: Secondary | ICD-10-CM | POA: Diagnosis not present

## 2017-08-09 DIAGNOSIS — E559 Vitamin D deficiency, unspecified: Secondary | ICD-10-CM | POA: Insufficient documentation

## 2017-08-09 DIAGNOSIS — M47816 Spondylosis without myelopathy or radiculopathy, lumbar region: Secondary | ICD-10-CM | POA: Diagnosis not present

## 2017-08-09 DIAGNOSIS — Z79899 Other long term (current) drug therapy: Secondary | ICD-10-CM | POA: Insufficient documentation

## 2017-08-09 DIAGNOSIS — Z8489 Family history of other specified conditions: Secondary | ICD-10-CM | POA: Insufficient documentation

## 2017-08-09 DIAGNOSIS — M5137 Other intervertebral disc degeneration, lumbosacral region: Secondary | ICD-10-CM | POA: Diagnosis not present

## 2017-08-09 DIAGNOSIS — Z880 Allergy status to penicillin: Secondary | ICD-10-CM | POA: Insufficient documentation

## 2017-08-09 DIAGNOSIS — M25511 Pain in right shoulder: Secondary | ICD-10-CM | POA: Diagnosis not present

## 2017-08-09 DIAGNOSIS — Z89012 Acquired absence of left thumb: Secondary | ICD-10-CM | POA: Insufficient documentation

## 2017-08-09 MED ORDER — GABAPENTIN 600 MG PO TABS: 600.0000 mg | ORAL_TABLET | Freq: Three times a day (TID) | ORAL | 0 refills | Status: DC

## 2017-08-09 NOTE — Progress Notes (Signed)
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  He has been smoking about 0.50 packs per day. He has never used smokeless tobacco. Medical:  has a past medical history of Bilateral sciatica; Carpal tunnel syndrome; Chronic back pain; Chronic neck pain; Chronic pain syndrome; Herniated disc; and Traumatic amputation of left thumb (2013). Family: family history includes Cancer in his father and paternal grandfather; Cirrhosis in his paternal grandmother; Deafness in his sister; Fibromyalgia in his sister.  Past Surgical History:  Procedure Laterality Date  . AMPUTATION FINGER / THUMB Left 2013  . HERNIA REPAIR    . HIP SURGERY    . JOINT REPLACEMENT Right    knee  . KNEE SURGERY    . neck fusion    . REPLACEMENT TOTAL KNEE Right   . SHOULDER SURGERY Right 2016 X 2  . SPINAL FUSION    . TONSILLECTOMY     Active Ambulatory Problems    Diagnosis Date  Noted  . Bilateral sciatica   . Insomnia 06/19/2017  . Vitamin D deficiency 06/20/2017  . Chronic pain syndrome 08/07/2017   Resolved Ambulatory Problems    Diagnosis Date Noted  . No Resolved Ambulatory Problems   Past Medical History:  Diagnosis Date  . Bilateral sciatica   . Carpal tunnel syndrome   . Chronic back pain   . Chronic neck pain   . Chronic pain syndrome   . Herniated disc   . Traumatic amputation of left thumb 2013   Constitutional Exam  General appearance: Well nourished, well developed, and well hydrated. In no apparent acute distress Vitals:   08/09/17 1013 08/09/17 1016  BP:  (!) 161/104  Pulse: 83   Resp: 18   Temp: 98.4 F (36.9 C)   SpO2: 97%   Weight: 182 lb (82.6 kg)   Height: 6' (1.829 m)    BMI Assessment: Estimated body mass index is 24.68 kg/m as calculated from the following:   Height as of this encounter: 6' (1.829 m).   Weight as of this encounter: 182 lb (82.6 kg).  BMI interpretation table: BMI level Category Range association with higher incidence of chronic pain  <18 kg/m2 Underweight   18.5-24.9 kg/m2 Ideal body weight   25-29.9 kg/m2 Overweight Increased incidence by 20%  30-34.9 kg/m2 Obese (Class I) Increased incidence by 68%  35-39.9 kg/m2 Severe obesity (Class II) Increased incidence by 136%  >40 kg/m2 Extreme obesity (Class III) Increased incidence by 254%   BMI Readings from Last 4 Encounters:  08/09/17 24.68 kg/m  08/07/17 27.71 kg/m  07/13/17 27.83 kg/m  07/05/17 24.41 kg/m   Wt Readings from Last 4 Encounters:  08/09/17 182 lb (82.6 kg)  08/07/17 182 lb 4 oz (82.7 kg)  07/13/17 183 lb (83 kg)  07/05/17 180 lb (81.6 kg)  Psych/Mental status: Alert, oriented x 3 (person, place, & time)       Eyes: PERLA Respiratory: No evidence of acute respiratory distress  Cervical Spine Area Exam  Skin & Axial Inspection: No masses, redness, edema, swelling, or associated skin lesions Alignment: Symmetrical Functional  ROM: Decreased ROM     limited cervical extension.  Stability: No  instability detected Muscle Tone/Strength: Functionally intact. No obvious neuro-muscular anomalies detected. Sensory (Neurological): Movement-associated discomfort Palpation: Complains of area being tender to palpation              Upper Extremity (UE) Exam    Side: Right upper extremity  Side: Left upper extremity  Skin & Extremity Inspection: Skin color, temperature, and hair growth are WNL. No peripheral edema or cyanosis. No masses, redness, swelling, asymmetry, or associated skin lesions. No contractures.Many tattoos   Skin & Extremity Inspection: Skin color, temperature, and hair growth are WNL. No peripheral edema or cyanosis. No masses, redness, swelling, asymmetry, or associated skin lesions. No contractures.  Functional ROM: Decreased ROM decreased right shoulder abduction           Functional ROM: Unrestricted ROM          Muscle Tone/Strength: Functionally intact. No obvious neuro-muscular anomalies detected.  Muscle Tone/Strength: Functionally intact. No obvious neuro-muscular anomalies detected.  Sensory (Neurological): Movement-associated pain  Sensory (Neurological): Unimpaired  Palpation: Complains of area being tender to palpation              Palpation: No palpable anomalies              Specialized Test(s): Deferred         Specialized Test(s): Deferred          Thoracic Spine Area Exam  Skin & Axial Inspection: No masses, redness, or swelling Alignment: Symmetrical Functional ROM: Unrestricted ROM Stability: No instability detected Muscle Tone/Strength: Functionally intact. No obvious neuro-muscular anomalies detected. Sensory (Neurological): Unimpaired Muscle strength & Tone: No palpable anomalies  Lumbar Spine Area Exam  Skin & Axial Inspection: No masses, redness, or swelling Alignment: Symmetrical Functional ROM: Decreased ROM      Stability: No instability detected Muscle Tone/Strength:  Functionally intact. No obvious neuro-muscular anomalies detected. Sensory (Neurological): Movement-associated pain Palpation: Complains of area being tender to palpation       Provocative Tests: Lumbar Hyperextension and rotation test: Positive       Lumbar Lateral bending test: Positive       Patrick's Maneuver: evaluation deferred today                    Gait & Posture Assessment  Ambulation: Unassisted Gait: Antalgic Posture: WNL   Lower Extremity Exam    Side: Right lower extremity  Side: Left lower extremity  Skin & Extremity Inspection: Evidence of prior arthroplastic surgery  Skin & Extremity Inspection: Skin color, temperature, and hair growth are WNL. No peripheral edema or cyanosis. No masses, redness, swelling, asymmetry, or associated skin lesions. No contractures.  Functional ROM: Decreased ROM          Functional ROM: Unrestricted ROM          Muscle Tone/Strength: Movement possible against some resistance (4/5)  Muscle Tone/Strength: Functionally intact. No obvious neuro-muscular anomalies detected.  Sensory (Neurological): Movement-associated pain  Sensory (Neurological): Movement-associated discomfort  Palpation: Tender  Palpation: No palpable anomalies   Assessment  Primary Diagnosis & Pertinent Problem List: The primary encounter diagnosis was Right knee pain, unspecified chronicity. Diagnoses of H/O right knee surgery, Chronic bilateral low back pain with bilateral sciatica, Lumbar spondylosis, Lumbar degenerative disc disease, History of incarceration, Chronic right shoulder pain, and S/P cervical spinal fusion were also pertinent to this visit.  Visit Diagnosis (New problems to examiner): 1. Right knee pain, unspecified chronicity   2. H/O right knee surgery   3. Chronic bilateral low back pain  with bilateral sciatica   4. Lumbar spondylosis   5. Lumbar degenerative disc disease   6. History of incarceration   7. Chronic right shoulder pain   8. S/P cervical  spinal fusion    Plan of Care (Initial workup plan)   46 year old male with extensive past surgical history presents with right knee pain status post 3 knee surgeries with severe knee mobility and instability. Patient also endorses low back pain secondary to lumbar spondylosis, degenerative lumbar disc disease, lumbar facet arthropathy. Patient also has right shoulder pain secondary to right shoulder arthropathy and myofascial pain along his right bicep.  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 His last one was dated back to 2010. Patient was also persistent on opioid medications. He states that nothing has helped except opioid medications. When asked about the dose of medications that he has previously taken, the patient was unable to recall and quickly stated that none of the medications that I were listing were helping. Furthermore when talking about various interventional options for his low back which is secondary to lumbar spondylosis, facet disease, degenerative disc disease, I did offer him diagnostic lumbar medial branch nerve blocks with goal radiofrequency ablation as the patient does have pain with facet loading and radiographic evidence of facet disease. Patient states that he is not interested in any additional procedures.  I encouraged the patient to see his orthopedic surgeon regarding his knee instability and possible right knee surgery revision. Patient states that he was Re: Told that he needed to have his right knee surgery done approximately 5 years ago. I expect that his knee arthritis has only advanced since then contributed to worsening knee pain.  I offer the patient various non-opioid analgesics and he declined all of them. He was only interested in opioid analgesics and I told him that he would not be a candidate for opioid therapy at our clinic given his prior [facility] record and the fact that chronic opioid therapy is  not effective for primary osteoarthritis especially given his age and the risk of dependence intolerance in this gentleman who is a prior history of DUI's.  The patient did state that gabapentin, which was increased yesterday to 400 mg 3 times a day was helping him somewhat. He is interested in increasing the dose. I've written him with a prescription for gabapentin 600 mg 3 times a day which I've instructed him to start next week. If no side effects he can increase to 600 mg, 600 mg, 1200 mg daily at bedtime. Dose titration to a maximum daily dose of 3600 can be made by primary care physician.  Patient was not interested in pursuing any interventional therapies or exploring other medications that he has not tried.  Plan: 1. Gabapentin 600 mg 3 times a day. Can increase to a maximum dose of 3600 mg daily with primary care physician. 2. Not a candidate for opioid therapy at our clinic. Please see notes above regarding opioid risk assessment tool and other risk factors that make this patient high risk for chronic opioid therapy.  Ordered Lab-work, Procedure(s), Referral(s), & Consult(s): No orders of the defined types were placed in this encounter.  Pharmacotherapy (current): Medications ordered:  Meds ordered this encounter  Medications  . gabapentin (NEURONTIN) 600 MG tablet    Sig: Take 1 tablet (600 mg total) by mouth every 8 (eight) hours.    Dispense:  90 tablet    Refill:  0  Do not place this medication, or any other prescription from our practice, on "Automatic Refill". Patient may have prescription filled one day early if pharmacy is closed on scheduled refill date.   Medications administered during this visit: Mr. Fangman had no medications administered during this visit.   Pharmacological management options:  Opioid Analgesics: The patient was informed that there is no guarantee that he would be a candidate for opioid analgesics. The decision will be made following CDC  guidelines. This decision will be based on the results of diagnostic studies, as well as Mr. Record risk profile.   Membrane stabilizer: To be determined at a later time  Muscle relaxant: To be determined at a later time  NSAID: To be determined at a later time  Other analgesic(s): To be determined at a later time   Interventional management options: Mr. Spelman was informed that there is no guarantee that he would be a candidate for interventional therapies. The decision will be based on the results of diagnostic studies, as well as Mr. Sheppard risk profile.  Procedure(s) under consideration:  -Offered patient lumbar medial branch nerve blocks with goal radiofrequency ablation but he declined    Provider-requested follow-up: No Follow-up on file.  Future Appointments Date Time Provider [place]  08/31/2017 1:00 PM Valerie Roys, DO [facility] None    Primary Care Physician: Valerie Roys, DO Location: [facility] Note by: Gillis Santa, M.D, Date: 08/09/2017; Time: 11:37 AM  Patient Instructions  -Increase Gabapentin to 600 mg three times a day. This can be further increased by your PCP up to a dose of 3600 mg daily (1200 mg three times a day).  -Recommend following back up with [facility] regarding right knee pain and revision knee surgery due to severe knee instability.

## 2017-08-09 NOTE — Patient Instructions (Signed)
-  Increase Gabapentin to 600 mg three times a day. This can be further increased by your PCP up to a dose of 3600 mg daily (1200 mg three times a day).  -Recommend following back up with [facility] regarding right knee pain and revision knee surgery due to severe knee instability.

## 2017-08-09 NOTE — Progress Notes (Signed)
Safety precautions to be maintained throughout the outpatient stay will include: orient to surroundings, keep bed in low position, maintain call bell within reach at all times, provide assistance with transfer out of bed and ambulation.  

## 2017-08-23 ENCOUNTER — Emergency Department: Payer: Medicaid Other

## 2017-08-23 DIAGNOSIS — S62632A Displaced fracture of distal phalanx of right middle finger, initial encounter for closed fracture: Secondary | ICD-10-CM | POA: Diagnosis not present

## 2017-08-23 DIAGNOSIS — Y9383 Activity, rough housing and horseplay: Secondary | ICD-10-CM | POA: Diagnosis not present

## 2017-08-23 DIAGNOSIS — F17210 Nicotine dependence, cigarettes, uncomplicated: Secondary | ICD-10-CM | POA: Diagnosis not present

## 2017-08-23 DIAGNOSIS — W51XXXA Accidental striking against or bumped into by another person, initial encounter: Secondary | ICD-10-CM | POA: Insufficient documentation

## 2017-08-23 DIAGNOSIS — G8929 Other chronic pain: Secondary | ICD-10-CM | POA: Diagnosis not present

## 2017-08-23 DIAGNOSIS — Z91040 Latex allergy status: Secondary | ICD-10-CM | POA: Insufficient documentation

## 2017-08-23 DIAGNOSIS — Z79899 Other long term (current) drug therapy: Secondary | ICD-10-CM | POA: Diagnosis not present

## 2017-08-23 DIAGNOSIS — Y929 Unspecified place or not applicable: Secondary | ICD-10-CM | POA: Diagnosis not present

## 2017-08-23 DIAGNOSIS — S63696A Other sprain of right little finger, initial encounter: Secondary | ICD-10-CM | POA: Insufficient documentation

## 2017-08-23 DIAGNOSIS — Y999 Unspecified external cause status: Secondary | ICD-10-CM | POA: Diagnosis not present

## 2017-08-23 DIAGNOSIS — S6991XA Unspecified injury of right wrist, hand and finger(s), initial encounter: Secondary | ICD-10-CM | POA: Diagnosis present

## 2017-08-23 NOTE — ED Triage Notes (Signed)
Patient reports he was playing with friend's child and his right little finger got pulled back and he heard a 'popping' noise. Pt has swelling, and limited movement of right pinky.

## 2017-08-24 ENCOUNTER — Emergency Department
Admission: EM | Admit: 2017-08-24 | Discharge: 2017-08-24 | Disposition: A | Discharge status: Home / Self Care | Payer: Medicaid Other | Attending: Emergency Medicine | Admitting: Emergency Medicine

## 2017-08-24 DIAGNOSIS — S63696A Other sprain of right little finger, initial encounter: Secondary | ICD-10-CM

## 2017-08-24 MED ORDER — ETODOLAC 200 MG PO CAPS: 200.0000 mg | ORAL_CAPSULE | Freq: Three times a day (TID) | ORAL | 0 refills | Status: DC

## 2017-08-24 MED ORDER — OXYCODONE-ACETAMINOPHEN 5-325 MG PO TABS
1.0000 | ORAL_TABLET | Freq: Once | ORAL | Status: AC
  Administered 2017-08-24: 1 via ORAL
  Filled 2017-08-24: qty 1

## 2017-08-24 NOTE — ED Notes (Signed)
Splint applied to pt. Pt educated on care and has verbalized understanding.

## 2017-08-24 NOTE — Discharge Instructions (Signed)
Please follow up if your have persistent pain to the finger.

## 2017-08-24 NOTE — ED Provider Notes (Signed)
[facility] Emergency Department Provider Note   ____________________________________________   First MD Initiated Contact with Patient 08/24/17 0259     (approximate)  I have reviewed the triage vital signs and the nursing notes.   HISTORY  Chief Complaint Finger Injury    HPI Johnny [name]. is a 46 y.o. male who comes into the hospital today with some finger pain. The patient states that he was playing with his friend's kids when they bent his right little finger back. He started having some pain in his right little finger. He took some ibuprofen and Tylenol but states that it didn't help. He denies it because it was hurting so bad. The patient's pain is 8 out of 10 in intensity. The patient reports that he is unable to bend his finger and he can only move a little bit. It is swollen and he is concerned it may be broken. The patient has broken a bone in his hand connected to his pinky finger. His pain is from his metacarpal all the way to his mid finger.   Past Medical History:  Diagnosis Date  . Bilateral sciatica   . Carpal tunnel syndrome   . Chronic back pain   . Chronic neck pain   . Chronic pain syndrome   . Herniated disc   . Traumatic amputation of left thumb 2013    Patient Active Problem List   Diagnosis Date Noted  . Chronic pain syndrome 08/07/2017  . Vitamin D deficiency 06/20/2017  . Insomnia 06/19/2017  . Bilateral sciatica     Past Surgical History:  Procedure Laterality Date  . AMPUTATION FINGER / THUMB Left 2013  . HERNIA REPAIR    . HIP SURGERY    . JOINT REPLACEMENT Right    knee  . KNEE SURGERY    . neck fusion    . REPLACEMENT TOTAL KNEE Right   . SHOULDER SURGERY Right 2016 X 2  . SPINAL FUSION    . TONSILLECTOMY      Prior to Admission medications   Medication Sig Start Date End Date Taking? Authorizing Provider  acetaminophen (TYLENOL) 325 MG tablet Take 975 mg by mouth 3 (three) times daily.     [provider]  etodolac (LODINE) 200 MG capsule Take 1 capsule (200 mg total) by mouth every 8 (eight) hours. 08/24/17   Loney Hering, MD  gabapentin (NEURONTIN) 300 MG capsule TAKE 1 CAPSULE BY MOUTH THREE TIMES A DAY 07/13/17   [provider]  gabapentin (NEURONTIN) 600 MG tablet Take 1 tablet (600 mg total) by mouth every 8 (eight) hours. 08/09/17   Gillis Santa, MD  ibuprofen (ADVIL,MOTRIN) 800 MG tablet Take 1 tablet (800 mg total) by mouth every 8 (eight) hours as needed. 06/22/17   Volney American, PA-C  Vitamin D, Ergocalciferol, (DRISDOL) 50000 units CAPS capsule Take 1 capsule (50,000 Units total) by mouth every 7 (seven) days. 06/20/17   Park Liter P, DO    Allergies Flexeril [cyclobenzaprine]; Codeine; Latex; Penicillins; Tramadol; and Sulfamethoxazole-trimethoprim  Family History  Problem Relation Age of Onset  . Cancer Father        sarcoma  . Cirrhosis Paternal Grandmother   . Cancer Paternal Grandfather        Pancreatic  . Fibromyalgia Sister   . Deafness Sister     Social History Social History  Substance Use Topics  . Smoking status: Current Every Day Smoker    Packs/day: 0.50  Types: Cigarettes  . Smokeless tobacco: Never Used  . Alcohol use No    Review of Systems  Constitutional: No fever/chills Eyes: No visual changes. ENT: No sore throat. Cardiovascular: Denies chest pain. Respiratory: Denies shortness of breath. Gastrointestinal: No abdominal pain.  No nausea, no vomiting.  No diarrhea.  No constipation. Genitourinary: Negative for dysuria. Musculoskeletal: Right fifth digit pain and swelling Skin: Negative for rash. Neurological: Negative for headaches, focal weakness or numbness.   ____________________________________________   PHYSICAL EXAM:  VITAL SIGNS: ED Triage Vitals  Enc Vitals Group     BP 08/23/17 2319 (!) 150/100     Pulse Rate 08/23/17 2319 94     Resp 08/23/17 2319 18     Temp 08/23/17  2319 98.5 F (36.9 C)     Temp Source 08/23/17 2319 Oral     SpO2 08/23/17 2319 97 %     Weight 08/23/17 2313 182 lb (82.6 kg)     Height 08/23/17 2313 6' (1.829 m)     Head Circumference --      Peak Flow --      Pain Score 08/23/17 2319 9     Pain Loc --      Pain Edu? --      Excl. in [place]? --     Constitutional: Alert and oriented. Well appearing and in  distress. Eyes: Conjunctivae are normal. PERRL. EOMI. Head: Atraumatic. Nose: No congestion/rhinnorhea. Mouth/Throat: Mucous membranes are moist.  Oropharynx non-erythematous. Cardiovascular: Normal rate, regular rhythm. Grossly normal heart sounds.  Good peripheral circulation. Respiratory: Normal respiratory effort.  No retractions. Lungs CTAB. Gastrointestinal: Soft and nontender. No distention. Active bowel sounds Musculoskeletal: Tenderness to palpation of the proximal and medial portion of the right fifth digit with some soft tissue swelling. Mild discoloration pain to passive range of motion. Neurologic:  Normal speech and language.  Skin:  Skin is warm, dry and intact.  Psychiatric: Mood and affect are normal.   ____________________________________________   LABS (all labs ordered are listed, but only abnormal results are displayed)  Labs Reviewed - No data to display ____________________________________________  EKG  none ____________________________________________  RADIOLOGY  Dg Hand Complete Right  Result Date: 08/23/2017 CLINICAL DATA:  Injury to the finger with swelling EXAM: RIGHT HAND - COMPLETE 3+ VIEW COMPARISON:  07/26/2017 FINDINGS: Old deformity of the fifth metacarpal. There appears to be a small tuft fracture of the third distal phalanx of uncertain age. No subluxation. No radiopaque foreign body. IMPRESSION: 1. Old deformity of the right fifth metacarpal 2. Small tuft fracture of the third distal phalanx of uncertain age. Otherwise no acute osseous abnormality. Electronically Signed   By: Donavan Foil M.D.   On: 08/23/2017 23:37    ____________________________________________   PROCEDURES  Procedure(s) performed: None  .Splint Application Date/Time: 9/67/8938 3:30 AM Performed by: Loney Hering Authorized by: Loney Hering   Consent:    Consent obtained:  Verbal   Consent given by:  Patient Pre-procedure details:    Sensation:  Normal Procedure details:    Laterality:  Right   Location:  Hand   Hand:  R hand   Splint type:  Ulnar gutter   Supplies:  Ortho-Glass Post-procedure details:    Pain:  Unchanged   Sensation:  Normal   Patient tolerance of procedure:  Tolerated well, no immediate complications    Critical Care performed: No  ____________________________________________   INITIAL IMPRESSION / ASSESSMENT AND PLAN / ED COURSE  Pertinent labs & imaging  results that were available during my care of the patient were reviewed by me and considered in my medical decision making (see chart for details).  This is a 46 year old male who comes into the hospital today with some pain to his fifth right digit after it was bent back playing with some children. The patient's x-ray showed a tuft fracture of the third digit and an old fracture of the fifth metacarpal but he has no acute injury to this fifth digit. I did inform the patient that he may have some ligamentous injury or some ligament sprain or tear. We will place the patient in a splint and he will be discharged to follow-up with orthopedic surgery.      ____________________________________________   FINAL CLINICAL IMPRESSION(S) / ED DIAGNOSES  Final diagnoses:  Sprain of other site of right little finger, initial encounter      NEW MEDICATIONS STARTED DURING THIS VISIT:  Discharge Medication List as of 08/24/2017  4:07 AM    START taking these medications   Details  etodolac (LODINE) 200 MG capsule Take 1 capsule (200 mg total) by mouth every 8 (eight) hours., Starting Fri  08/24/2017, Print         Note:  This document was prepared using Dragon voice recognition software and may include unintentional dictation errors.    Loney Hering, MD 08/24/17 [phone]

## 2017-08-31 ENCOUNTER — Encounter: Payer: Self-pay | Admitting: Family Medicine

## 2017-08-31 ENCOUNTER — Ambulatory Visit (INDEPENDENT_AMBULATORY_CARE_PROVIDER_SITE_OTHER): Payer: Medicaid Other | Admitting: Family Medicine

## 2017-08-31 VITALS — BP 166/104 | HR 79 | Temp 98.4°F | Ht 68.5 in | Wt 181.0 lb

## 2017-08-31 DIAGNOSIS — S62306A Unspecified fracture of fifth metacarpal bone, right hand, initial encounter for closed fracture: Secondary | ICD-10-CM

## 2017-08-31 MED ORDER — OXYCODONE-ACETAMINOPHEN 7.5-325 MG PO TABS: 1.0000 | ORAL_TABLET | ORAL | 0 refills | Status: DC | PRN

## 2017-08-31 NOTE — Patient Instructions (Addendum)

## 2017-08-31 NOTE — Progress Notes (Deleted)
There were no vitals taken for this visit.   Subjective:    Patient ID: Johnny [name]., male    DOB: Jan 14, 1971, 46 y.o.   MRN: [id number]  HPI: Johnny [name]. is a 46 y.o. male presenting on 08/31/2017 for comprehensive medical examination. Current medical complaints include:{Blank single:19197::"none","***"}  He currently lives with: his dad Interim Problems from his last visit: {Blank single:19197::"yes","no"}  Depression Screen done today and results listed below:  Depression screen [facility] 2/9 08/09/2017 06/19/2017  Decreased Interest 0 2  Down, Depressed, Hopeless 0 0  PHQ - 2 Score 0 2  Altered sleeping - 3  Tired, decreased energy - 3  Change in appetite - 0  Feeling bad or failure about yourself  - 0  Trouble concentrating - 0  Moving slowly or fidgety/restless - 0  Suicidal thoughts - 0  PHQ-9 Score - 8  Difficult doing work/chores - Somewhat difficult    Past Medical History:  Past Medical History:  Diagnosis Date  . Bilateral sciatica   . Carpal tunnel syndrome   . Chronic back pain   . Chronic neck pain   . Chronic pain syndrome   . Herniated disc   . Traumatic amputation of left thumb 2013    Surgical History:  Past Surgical History:  Procedure Laterality Date  . AMPUTATION FINGER / THUMB Left 2013  . HERNIA REPAIR    . HIP SURGERY    . JOINT REPLACEMENT Right    knee  . KNEE SURGERY    . neck fusion    . REPLACEMENT TOTAL KNEE Right   . SHOULDER SURGERY Right 2016 X 2  . SPINAL FUSION    . TONSILLECTOMY      Medications:  Current Outpatient Prescriptions on File Prior to Visit  Medication Sig  . acetaminophen (TYLENOL) 325 MG tablet Take 975 mg by mouth 3 (three) times daily.  Marland Kitchen etodolac (LODINE) 200 MG capsule Take 1 capsule (200 mg total) by mouth every 8 (eight) hours.  . gabapentin (NEURONTIN) 300 MG capsule TAKE 1 CAPSULE BY MOUTH THREE TIMES A DAY  . gabapentin (NEURONTIN) 600 MG tablet Take 1 tablet (600 mg total) by mouth every 8  (eight) hours.  Marland Kitchen ibuprofen (ADVIL,MOTRIN) 800 MG tablet Take 1 tablet (800 mg total) by mouth every 8 (eight) hours as needed.  . Vitamin D, Ergocalciferol, (DRISDOL) 50000 units CAPS capsule Take 1 capsule (50,000 Units total) by mouth every 7 (seven) days.   No current facility-administered medications on file prior to visit.     Allergies:  Allergies  Allergen Reactions  . Flexeril [Cyclobenzaprine] Anaphylaxis  . Codeine Itching  . Latex Swelling  . Penicillins Nausea And Vomiting  . Tramadol Other (See Comments)    headaches  . Sulfamethoxazole-Trimethoprim Nausea Only and Other (See Comments)    Stomach pain     Social History:  Social History   Social History  . Marital status: Divorced    Spouse name: N/A  . Number of children: N/A  . Years of education: N/A   Occupational History  . Not on file.   Social History Main Topics  . Smoking status: Current Every Day Smoker    Packs/day: 0.50    Types: Cigarettes  . Smokeless tobacco: Never Used  . Alcohol use No  . Drug use: No  . Sexual activity: Yes   Other Topics Concern  . Not on file   Social History Narrative  . No narrative on  file   History  Smoking Status  . Current Every Day Smoker  . Packs/day: 0.50  . Types: Cigarettes  Smokeless Tobacco  . Never Used   History  Alcohol Use No    Family History:  Family History  Problem Relation Age of Onset  . Cancer Father        sarcoma  . Cirrhosis Paternal Grandmother   . Cancer Paternal Grandfather        Pancreatic  . Fibromyalgia Sister   . Deafness Sister     Past medical history, surgical history, medications, allergies, family history and social history reviewed with patient today and changes made to appropriate areas of the chart.   ROS All other ROS negative except what is listed above and in the HPI.      Objective:    There were no vitals taken for this visit.  Wt Readings from Last 3 Encounters:  08/23/17 182 lb (82.6 kg)   08/09/17 182 lb (82.6 kg)  08/07/17 182 lb 4 oz (82.7 kg)    Physical Exam  Results for orders placed or performed in visit on 06/19/17  Microscopic Examination  Result Value Ref Range   RBC, UA 0-2 0 - 2 /hpf   Epithelial Cells (non renal) 0-10 0 - 10 /hpf  CBC with Differential/Platelet  Result Value Ref Range   WBC 7.7 3.4 - 10.8 x10E3/uL   RBC 4.64 4.14 - 5.80 x10E6/uL   Hemoglobin 15.4 13.0 - 17.7 g/dL   Hematocrit 45.4 37.5 - 51.0 %   MCV 98 (H) 79 - 97 fL   MCH 33.2 (H) 26.6 - 33.0 pg   MCHC 33.9 31.5 - 35.7 g/dL   RDW 14.6 12.3 - 15.4 %   Platelets 242 150 - 379 x10E3/uL   Neutrophils 73 Not Estab. %   Lymphs 21 Not Estab. %   Monocytes 6 Not Estab. %   Eos 0 Not Estab. %   Basos 0 Not Estab. %   Neutrophils Absolute 5.6 1.4 - 7.0 x10E3/uL   Lymphocytes Absolute 1.6 0.7 - 3.1 x10E3/uL   Monocytes Absolute 0.5 0.1 - 0.9 x10E3/uL   EOS (ABSOLUTE) 0.0 0.0 - 0.4 x10E3/uL   Basophils Absolute 0.0 0.0 - 0.2 x10E3/uL   Immature Granulocytes 0 Not Estab. %   Immature Grans (Abs) 0.0 0.0 - 0.1 x10E3/uL  Comprehensive metabolic panel  Result Value Ref Range   Glucose 86 65 - 99 mg/dL   BUN 11 6 - 24 mg/dL   Creatinine, Ser 0.84 0.76 - 1.27 mg/dL   GFR calc non Af Amer 105 >59 mL/min/1.73   GFR calc Af Amer 121 >59 mL/min/1.73   BUN/Creatinine Ratio 13 9 - 20   Sodium 140 134 - 144 mmol/L   Potassium 4.5 3.5 - 5.2 mmol/L   Chloride 103 96 - 106 mmol/L   CO2 20 20 - 29 mmol/L   Calcium 8.9 8.7 - 10.2 mg/dL   Total Protein 7.0 6.0 - 8.5 g/dL   Albumin 4.2 3.5 - 5.5 g/dL   Globulin, Total 2.8 1.5 - 4.5 g/dL   Albumin/Globulin Ratio 1.5 1.2 - 2.2   Bilirubin Total 0.2 0.0 - 1.2 mg/dL   Alkaline Phosphatase 215 (H) 39 - 117 IU/L   AST 26 0 - 40 IU/L   ALT 43 0 - 44 IU/L  Lipid Panel w/o Chol/HDL Ratio  Result Value Ref Range   Cholesterol, Total 145 100 - 199 mg/dL   Triglycerides 64 0 -  149 mg/dL   HDL 65 >39 mg/dL   VLDL Cholesterol Cal 13 5 - 40 mg/dL   LDL  Calculated 67 0 - 99 mg/dL  Microalbumin, Urine Waived  Result Value Ref Range   Microalb, Ur Waived 10 0 - 19 mg/L   Creatinine, Urine Waived 10 10 - 300 mg/dL   Microalb/Creat Ratio 30-300 (H) <30 mg/g  Thyroid Panel With TSH  Result Value Ref Range   TSH 0.401 (L) 0.450 - 4.500 uIU/mL   T4, Total 6.8 4.5 - 12.0 ug/dL   T3 Uptake Ratio 29 24 - 39 %   Free Thyroxine Index 2.0 1.2 - 4.9  UA/M w/rflx Culture, Routine  Result Value Ref Range   Specific Gravity, UA 1.010 1.005 - 1.030   pH, UA 6.5 5.0 - 7.5   Color, UA Yellow Yellow   Appearance Ur Clear Clear   Leukocytes, UA Negative Negative   Protein, UA Negative Negative/Trace   Glucose, UA Negative Negative   Ketones, UA Negative Negative   RBC, UA Trace (A) Negative   Bilirubin, UA Negative Negative   Urobilinogen, Ur 0.2 0.2 - 1.0 mg/dL   Nitrite, UA Negative Negative   Microscopic Examination See below:   VITAMIN D 25 Hydroxy (Vit-D Deficiency, Fractures)  Result Value Ref Range   Vit D, 25-Hydroxy 19.6 (L) 30.0 - 100.0 ng/mL      Assessment & Plan:   Problem List Items Addressed This Visit    None       LABORATORY TESTING:  Health maintenance labs ordered today as discussed above.   IMMUNIZATIONS:   - Tdap: Tetanus vaccination status reviewed: {tetanus status:315746}. - Influenza: {Blank single:19197::"Up to date","Administered today","Postponed to flu season","Refused","Given elsewhere"} - Pneumovax: {Blank single:19197::"Up to date","Administered today","Not applicable","Refused","Given elsewhere"}  PATIENT COUNSELING:    Sexuality: Discussed sexually transmitted diseases, partner selection, use of condoms, avoidance of unintended pregnancy  and contraceptive alternatives.   Advised to avoid cigarette smoking.  I discussed with the patient that most people either abstain from alcohol or drink within safe limits (<=14/week and <=4 drinks/occasion for males, <=7/weeks and <= 3 drinks/occasion for females)  and that the risk for alcohol disorders and other health effects rises proportionally with the number of drinks per week and how often a drinker exceeds daily limits.  Discussed cessation/primary prevention of drug use and availability of treatment for abuse.   Diet: Encouraged to adjust caloric intake to maintain  or achieve ideal body weight, to reduce intake of dietary saturated fat and total fat, to limit sodium intake by avoiding high sodium foods and not adding table salt, and to maintain adequate dietary potassium and calcium preferably from fresh fruits, vegetables, and low-fat dairy products.    stressed the importance of regular exercise  Injury prevention: Discussed safety belts, safety helmets, smoke detector, smoking near bedding or upholstery.   Dental health: Discussed importance of regular tooth brushing, flossing, and dental visits.   Follow up plan: NEXT PREVENTATIVE PHYSICAL DUE IN 1 YEAR. No Follow-up on file.

## 2017-08-31 NOTE — Progress Notes (Signed)
BP (!) 166/104 (BP Location: Left Arm, Patient Position: Sitting, Cuff Size: Normal)   Pulse 79   Temp 98.4 F (36.9 C)   Ht 5' 8.5" (1.74 m)   Wt 181 lb (82.1 kg)   SpO2 97%   BMI 27.12 kg/m    Subjective:    Patient ID: Johnny Pronto., male    DOB: 19-Aug-1971, 46 y.o.   MRN: [id number]  HPI: Johnny Reaume. is a 46 y.o. male  Chief Complaint  Patient presents with  . Hand Pain    right   HAND PAIN- had his finger bent back while playing with a friend's kid week ago. Diagnosed with a tuft fracture of the 3rd digit and a previous old fracture of his 5th metacarpal. They were concerned about a ligamentous sprain or tear and put him in a brace and got him follow up with orthopedics. He saw orthopedics 7 days ago and was started on percocet (30pills given) and followed up with them on 08/30/17. He states that when he saw the doctor yesterday, she moved his fingers all around and squeezed the tip of the 3rd finger. She told him to keep his fingers in the splint and to try not to move them. He is supposed to follow up with her in 2 weeks. He states that she did not refill any of his pain medicine at that time.  Duration: 1 week Involved hand: right Mechanism of injury: trauma Location: 5th finger Onset: sudden Severity: severe  Quality: sharp and throbbing Frequency: constant Radiation: no Aggravating factors: moving his finger Alleviating factors: pain medicine Treatments attempted: meloxicam, buddy taping and percocet Relief with NSAIDs?: no Weakness: no Numbness: no Redness: no Swelling:no Bruising: no Fevers: no  Relevant past medical, surgical, family and social history reviewed and updated as indicated. Interim medical history since our last visit reviewed. Allergies and medications reviewed and updated.  Review of Systems  Constitutional: Negative.   Respiratory: Negative.   Cardiovascular: Negative.   Musculoskeletal: Positive for arthralgias and joint  swelling. Negative for back pain, gait problem, myalgias, neck pain and neck stiffness.  Psychiatric/Behavioral: Negative.     Per HPI unless specifically indicated above     Objective:    BP (!) 166/104 (BP Location: Left Arm, Patient Position: Sitting, Cuff Size: Normal)   Pulse 79   Temp 98.4 F (36.9 C)   Ht 5' 8.5" (1.74 m)   Wt 181 lb (82.1 kg)   SpO2 97%   BMI 27.12 kg/m   Wt Readings from Last 3 Encounters:  08/31/17 181 lb (82.1 kg)  08/23/17 182 lb (82.6 kg)  08/09/17 182 lb (82.6 kg)    Physical Exam  Constitutional: He is oriented to person, place, and time. He appears well-developed and well-nourished. No distress.  HENT:  Head: Normocephalic and atraumatic.  Right Ear: Hearing normal.  Left Ear: Hearing normal.  Nose: Nose normal.  Eyes: Conjunctivae and lids are normal. Right eye exhibits no discharge. Left eye exhibits no discharge. No scleral icterus.  Pulmonary/Chest: Effort normal. No respiratory distress.  Musculoskeletal: He exhibits edema and tenderness. He exhibits no deformity.  Tenderness to palpation of his 5th and 3rd finger on the R hand  Neurological: He is alert and oriented to person, place, and time.  Skin: Skin is warm, dry and intact. No rash noted. He is not diaphoretic. No erythema. No pallor.  Psychiatric: He has a normal mood and affect. His speech is normal and behavior  is normal. Judgment and thought content normal. Cognition and memory are normal.  Nursing note and vitals reviewed.   Results for orders placed or performed in visit on 06/19/17  Microscopic Examination  Result Value Ref Range   RBC, UA 0-2 0 - 2 /hpf   Epithelial Cells (non renal) 0-10 0 - 10 /hpf  CBC with Differential/Platelet  Result Value Ref Range   WBC 7.7 3.4 - 10.8 x10E3/uL   RBC 4.64 4.14 - 5.80 x10E6/uL   Hemoglobin 15.4 13.0 - 17.7 g/dL   Hematocrit 45.4 37.5 - 51.0 %   MCV 98 (H) 79 - 97 fL   MCH 33.2 (H) 26.6 - 33.0 pg   MCHC 33.9 31.5 - 35.7  g/dL   RDW 14.6 12.3 - 15.4 %   Platelets 242 150 - 379 x10E3/uL   Neutrophils 73 Not Estab. %   Lymphs 21 Not Estab. %   Monocytes 6 Not Estab. %   Eos 0 Not Estab. %   Basos 0 Not Estab. %   Neutrophils Absolute 5.6 1.4 - 7.0 x10E3/uL   Lymphocytes Absolute 1.6 0.7 - 3.1 x10E3/uL   Monocytes Absolute 0.5 0.1 - 0.9 x10E3/uL   EOS (ABSOLUTE) 0.0 0.0 - 0.4 x10E3/uL   Basophils Absolute 0.0 0.0 - 0.2 x10E3/uL   Immature Granulocytes 0 Not Estab. %   Immature Grans (Abs) 0.0 0.0 - 0.1 x10E3/uL  Comprehensive metabolic panel  Result Value Ref Range   Glucose 86 65 - 99 mg/dL   BUN 11 6 - 24 mg/dL   Creatinine, Ser 0.84 0.76 - 1.27 mg/dL   GFR calc non Af Amer 105 >59 mL/min/1.73   GFR calc Af Amer 121 >59 mL/min/1.73   BUN/Creatinine Ratio 13 9 - 20   Sodium 140 134 - 144 mmol/L   Potassium 4.5 3.5 - 5.2 mmol/L   Chloride 103 96 - 106 mmol/L   CO2 20 20 - 29 mmol/L   Calcium 8.9 8.7 - 10.2 mg/dL   Total Protein 7.0 6.0 - 8.5 g/dL   Albumin 4.2 3.5 - 5.5 g/dL   Globulin, Total 2.8 1.5 - 4.5 g/dL   Albumin/Globulin Ratio 1.5 1.2 - 2.2   Bilirubin Total 0.2 0.0 - 1.2 mg/dL   Alkaline Phosphatase 215 (H) 39 - 117 IU/L   AST 26 0 - 40 IU/L   ALT 43 0 - 44 IU/L  Lipid Panel w/o Chol/HDL Ratio  Result Value Ref Range   Cholesterol, Total 145 100 - 199 mg/dL   Triglycerides 64 0 - 149 mg/dL   HDL 65 >39 mg/dL   VLDL Cholesterol Cal 13 5 - 40 mg/dL   LDL Calculated 67 0 - 99 mg/dL  Microalbumin, Urine Waived  Result Value Ref Range   Microalb, Ur Waived 10 0 - 19 mg/L   Creatinine, Urine Waived 10 10 - 300 mg/dL   Microalb/Creat Ratio 30-300 (H) <30 mg/g  Thyroid Panel With TSH  Result Value Ref Range   TSH 0.401 (L) 0.450 - 4.500 uIU/mL   T4, Total 6.8 4.5 - 12.0 ug/dL   T3 Uptake Ratio 29 24 - 39 %   Free Thyroxine Index 2.0 1.2 - 4.9  UA/M w/rflx Culture, Routine  Result Value Ref Range   Specific Gravity, UA 1.010 1.005 - 1.030   pH, UA 6.5 5.0 - 7.5   Color, UA  Yellow Yellow   Appearance Ur Clear Clear   Leukocytes, UA Negative Negative   Protein, UA Negative Negative/Trace  Glucose, UA Negative Negative   Ketones, UA Negative Negative   RBC, UA Trace (A) Negative   Bilirubin, UA Negative Negative   Urobilinogen, Ur 0.2 0.2 - 1.0 mg/dL   Nitrite, UA Negative Negative   Microscopic Examination See below:   VITAMIN D 25 Hydroxy (Vit-D Deficiency, Fractures)  Result Value Ref Range   Vit D, 25-Hydroxy 19.6 (L) 30.0 - 100.0 ng/mL      Assessment & Plan:   Problem List Items Addressed This Visit    None    Visit Diagnoses    Closed nondisplaced fracture of fifth metacarpal bone of right hand, unspecified portion of metacarpal, initial encounter    -  Primary   Note from orthopedics not available. He did not like last orthopedist. Referral to new ortho made today. Rx for 5 days of percocet given today.   Relevant Orders   Ambulatory referral to Hand Surgery       Follow up plan: Return ASAP , for Physical.

## 2017-09-12 ENCOUNTER — Telehealth: Payer: Self-pay | Admitting: Family Medicine

## 2017-09-12 DIAGNOSIS — S62306S Unspecified fracture of fifth metacarpal bone, right hand, sequela: Secondary | ICD-10-CM

## 2017-09-12 NOTE — Telephone Encounter (Signed)
Patient unsure of the number and name of facility he was refereed to for his orthopedic hand surgery. Looked in referrals and saw the ortho for [facility] located in [place], [place] but patient mentioned that referral was changed to a closer location. Did not see a referral on my end for a closer location. Patient stated he was scheduled for visit yesterday with orthopedic but missed it due to hurting his back and was unable to call and inform them.  Please Advise.  Thank you

## 2017-09-14 NOTE — Telephone Encounter (Signed)
Johnny [name], can you look into this please? I do not see where the referral was changed to a closer location or where the patient had an appointment yesterday.

## 2017-09-17 ENCOUNTER — Telehealth: Payer: Self-pay | Admitting: Family Medicine

## 2017-09-17 MED ORDER — IBUPROFEN 800 MG PO TABS: 800.0000 mg | ORAL_TABLET | Freq: Three times a day (TID) | ORAL | 6 refills | Status: DC | PRN

## 2017-09-17 NOTE — Telephone Encounter (Signed)
Routing to provider  

## 2017-09-17 NOTE — Telephone Encounter (Signed)
Ibuprofen refilled. Needs lab recheck on his vitamin D before refill. Needs appointment to discuss pain medicine. We do not give refills of that over the phone.

## 2017-09-17 NOTE — Telephone Encounter (Signed)
Patient is needing refills on the following meds Ibuprofen 800mg  Vitamin D2  And he also is going to need pain med as he is not able to get an appointment with ortho right away.  [facility]

## 2017-09-17 NOTE — Addendum Note (Signed)
Addended by: Valerie Roys on: 09/17/2017 03:59 PM   Modules accepted: Orders

## 2017-09-17 NOTE — Telephone Encounter (Signed)
Dr.Johnson, does a new referral need to be placed since the patient has medicaid?

## 2017-09-17 NOTE — Telephone Encounter (Signed)
Called and left a message for patient to return my call.  

## 2017-09-17 NOTE — Telephone Encounter (Signed)
Unsure if he needs a new referral or just the old referral changed. New referral placed. Given last referral was closed and no new appointment set up- please try to get this scheduled ASAP.

## 2017-09-17 NOTE — Telephone Encounter (Signed)
Patient stopped by to check on status. Spoke with Marianna Fuss who did not see anything on referral to [facility] in [place]. Number to [facility] clinic in [place] was given to patient in person.

## 2017-09-18 ENCOUNTER — Telehealth: Payer: Self-pay | Admitting: Family Medicine

## 2017-09-18 NOTE — Telephone Encounter (Signed)
Left message for Johnny [name].  Dr. Clarene Critchley from [facility] did MRI of patient's wrist.  I do not have authorization to print off this doctor's result.  Told [name] either contact [facility] or ask patient to go get discs himself. Told her to call if she had any other questions.   FYI to Dr. Wynetta Emery

## 2017-09-18 NOTE — Telephone Encounter (Signed)
Dr. Wynetta Emery,  Kristin Bruins took care of this referral.  I believe patient has been scheduled for an appointment.

## 2017-09-18 NOTE — Telephone Encounter (Signed)
[name] from [facility] needing patient's x-ray reports, if any, faxed over to office.  Fax number: [phone] Phone: [phone] Ext: [phone]  Please Advise.  Thank you

## 2017-09-18 NOTE — Telephone Encounter (Signed)
Patient is scheduled to see ortho on Thursday, he will go see them and see what they say.

## 2017-10-10 ENCOUNTER — Encounter: Payer: Medicaid Other | Admitting: Family Medicine

## 2017-11-01 ENCOUNTER — Ambulatory Visit (INDEPENDENT_AMBULATORY_CARE_PROVIDER_SITE_OTHER): Payer: Medicaid Other | Admitting: Family Medicine

## 2017-11-01 ENCOUNTER — Ambulatory Visit
Admission: RE | Admit: 2017-11-01 | Discharge: 2017-11-01 | Disposition: A | Discharge status: Home / Self Care | Payer: Medicaid Other | Source: Ambulatory Visit | Attending: Family Medicine | Admitting: Family Medicine

## 2017-11-01 ENCOUNTER — Encounter: Payer: Self-pay | Admitting: Family Medicine

## 2017-11-01 VITALS — BP 133/94 | HR 71 | Wt 190.2 lb

## 2017-11-01 DIAGNOSIS — R609 Edema, unspecified: Secondary | ICD-10-CM | POA: Diagnosis not present

## 2017-11-01 DIAGNOSIS — M19011 Primary osteoarthritis, right shoulder: Secondary | ICD-10-CM | POA: Diagnosis not present

## 2017-11-01 DIAGNOSIS — Z636 Dependent relative needing care at home: Secondary | ICD-10-CM

## 2017-11-01 DIAGNOSIS — M79601 Pain in right arm: Secondary | ICD-10-CM

## 2017-11-01 DIAGNOSIS — G4700 Insomnia, unspecified: Secondary | ICD-10-CM | POA: Diagnosis not present

## 2017-11-01 DIAGNOSIS — M2550 Pain in unspecified joint: Secondary | ICD-10-CM | POA: Insufficient documentation

## 2017-11-01 DIAGNOSIS — Z96651 Presence of right artificial knee joint: Secondary | ICD-10-CM | POA: Insufficient documentation

## 2017-11-01 NOTE — Progress Notes (Signed)
BP (!) 133/94 (BP Location: Left Arm, Patient Position: Sitting, Cuff Size: Normal)   Pulse 71   Wt 190 lb 4 oz (86.3 kg)   SpO2 95%   BMI 28.51 kg/m    Subjective:    Patient ID: Johnny [name]., male    DOB: [birth year], 46 y.o.   MRN: [id number]  HPI: Johnny [name]. is a 46 y.o. male  Chief Complaint  Patient presents with  . Leg Pain    Right, patient feel asleep and woke up when he hit the floor, he hit his knee while falling   KNEE PAIN- patient was sitting on the edge of his bed and apparently fell asleep. He fell, but doesn't know how and woke up with intense pain in his R knee and ankle. He has not been able to walk on them well since.  Duration: 2 days ago Involved knee: right Mechanism of injury: trauma Location:anterior Onset: sudden Severity: severe  Quality:  Sharp stabbing pain Frequency: constant Radiation: into his foot Aggravating factors: weight bearing, walking and bending  Alleviating factors: nothing, APAP, NSAIDs and rest  Status: stable Treatments attempted: rest, ice, heat, APAP and ibuprofen  Relief with NSAIDs?:  no Weakness with weight bearing or walking: yes Sensation of giving way: yes Locking: no Popping: yes Bruising: no Swelling: yes Redness: no Paresthesias/decreased sensation: no Fevers: no   Relevant past medical, surgical, family and social history reviewed and updated as indicated. Interim medical history since our last visit reviewed. Allergies and medications reviewed and updated.  Review of Systems  Constitutional: Negative.   Respiratory: Negative.   Cardiovascular: Negative.   Musculoskeletal: Positive for arthralgias and joint swelling. Negative for back pain, gait problem, myalgias, neck pain and neck stiffness.  Skin: Negative.   Neurological: Negative.   Psychiatric/Behavioral: Negative.     Per HPI unless specifically indicated above     Objective:    BP (!) 133/94 (BP Location: Left Arm, Patient  Position: Sitting, Cuff Size: Normal)   Pulse 71   Wt 190 lb 4 oz (86.3 kg)   SpO2 95%   BMI 28.51 kg/m   Wt Readings from Last 3 Encounters:  11/01/17 190 lb 4 oz (86.3 kg)  08/31/17 181 lb (82.1 kg)  08/23/17 182 lb (82.6 kg)    Physical Exam  Constitutional: He is oriented to person, place, and time. He appears well-developed and well-nourished. No distress.  HENT:  Head: Normocephalic and atraumatic.  Right Ear: Hearing normal.  Left Ear: Hearing normal.  Nose: Nose normal.  Eyes: Conjunctivae and lids are normal. Right eye exhibits no discharge. Left eye exhibits no discharge. No scleral icterus.  Cardiovascular: Normal rate, regular rhythm, normal heart sounds and intact distal pulses. Exam reveals no gallop and no friction rub.  No murmur heard. Pulmonary/Chest: Effort normal and breath sounds normal. No respiratory distress. He has no wheezes. He has no rales. He exhibits no tenderness.  Musculoskeletal: He exhibits edema and tenderness. He exhibits no deformity.  Tenderness and swelling to R ankle and knee, tenderness along joint line. Not red or hot, negative Homan's negative squeeze, no swelling of the calf.   Neurological: He is alert and oriented to person, place, and time.  Skin: Skin is intact. No rash noted. He is not diaphoretic.  Psychiatric: He has a normal mood and affect. His speech is normal and behavior is normal. Judgment and thought content normal. Cognition and memory are normal.  Nursing note and vitals  reviewed.   Results for orders placed or performed in visit on 06/19/17  Microscopic Examination  Result Value Ref Range   RBC, UA 0-2 0 - 2 /hpf   Epithelial Cells (non renal) 0-10 0 - 10 /hpf  CBC with Differential/Platelet  Result Value Ref Range   WBC 7.7 3.4 - 10.8 x10E3/uL   RBC 4.64 4.14 - 5.80 x10E6/uL   Hemoglobin 15.4 13.0 - 17.7 g/dL   Hematocrit 45.4 37.5 - 51.0 %   MCV 98 (H) 79 - 97 fL   MCH 33.2 (H) 26.6 - 33.0 pg   MCHC 33.9 31.5 -  35.7 g/dL   RDW 14.6 12.3 - 15.4 %   Platelets 242 150 - 379 x10E3/uL   Neutrophils 73 Not Estab. %   Lymphs 21 Not Estab. %   Monocytes 6 Not Estab. %   Eos 0 Not Estab. %   Basos 0 Not Estab. %   Neutrophils Absolute 5.6 1.4 - 7.0 x10E3/uL   Lymphocytes Absolute 1.6 0.7 - 3.1 x10E3/uL   Monocytes Absolute 0.5 0.1 - 0.9 x10E3/uL   EOS (ABSOLUTE) 0.0 0.0 - 0.4 x10E3/uL   Basophils Absolute 0.0 0.0 - 0.2 x10E3/uL   Immature Granulocytes 0 Not Estab. %   Immature Grans (Abs) 0.0 0.0 - 0.1 x10E3/uL  Comprehensive metabolic panel  Result Value Ref Range   Glucose 86 65 - 99 mg/dL   BUN 11 6 - 24 mg/dL   Creatinine, Ser 0.84 0.76 - 1.27 mg/dL   GFR calc non Af Amer 105 >59 mL/min/1.73   GFR calc Af Amer 121 >59 mL/min/1.73   BUN/Creatinine Ratio 13 9 - 20   Sodium 140 134 - 144 mmol/L   Potassium 4.5 3.5 - 5.2 mmol/L   Chloride 103 96 - 106 mmol/L   CO2 20 20 - 29 mmol/L   Calcium 8.9 8.7 - 10.2 mg/dL   Total Protein 7.0 6.0 - 8.5 g/dL   Albumin 4.2 3.5 - 5.5 g/dL   Globulin, Total 2.8 1.5 - 4.5 g/dL   Albumin/Globulin Ratio 1.5 1.2 - 2.2   Bilirubin Total 0.2 0.0 - 1.2 mg/dL   Alkaline Phosphatase 215 (H) 39 - 117 IU/L   AST 26 0 - 40 IU/L   ALT 43 0 - 44 IU/L  Lipid Panel w/o Chol/HDL Ratio  Result Value Ref Range   Cholesterol, Total 145 100 - 199 mg/dL   Triglycerides 64 0 - 149 mg/dL   HDL 65 >39 mg/dL   VLDL Cholesterol Cal 13 5 - 40 mg/dL   LDL Calculated 67 0 - 99 mg/dL  Microalbumin, Urine Waived  Result Value Ref Range   Microalb, Ur Waived 10 0 - 19 mg/L   Creatinine, Urine Waived 10 10 - 300 mg/dL   Microalb/Creat Ratio 30-300 (H) <30 mg/g  Thyroid Panel With TSH  Result Value Ref Range   TSH 0.401 (L) 0.450 - 4.500 uIU/mL   T4, Total 6.8 4.5 - 12.0 ug/dL   T3 Uptake Ratio 29 24 - 39 %   Free Thyroxine Index 2.0 1.2 - 4.9  UA/M w/rflx Culture, Routine  Result Value Ref Range   Specific Gravity, UA 1.010 1.005 - 1.030   pH, UA 6.5 5.0 - 7.5   Color, UA  Yellow Yellow   Appearance Ur Clear Clear   Leukocytes, UA Negative Negative   Protein, UA Negative Negative/Trace   Glucose, UA Negative Negative   Ketones, UA Negative Negative   RBC, UA Trace (A)  Negative   Bilirubin, UA Negative Negative   Urobilinogen, Ur 0.2 0.2 - 1.0 mg/dL   Nitrite, UA Negative Negative   Microscopic Examination See below:   VITAMIN D 25 Hydroxy (Vit-D Deficiency, Fractures)  Result Value Ref Range   Vit D, 25-Hydroxy 19.6 (L) 30.0 - 100.0 ng/mL      Assessment & Plan:   Problem List Items Addressed This Visit      Other   Insomnia    Was not able to get his sleep study as he cares for 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 Given degree of injury with fall while asleep, significant concern for sleep pathology. Will attempt to get respite care in so he can get his sleep study. New referral generated today.      Relevant Orders   Ambulatory referral to Sleep Studies    Other Visit Diagnoses    Arthralgia, unspecified joint    -  Primary   Will check x-rays and labs. Await results. Call with any concerns.    Relevant Orders   Comprehensive metabolic panel   CBC with Differential/Platelet   Uric acid   Lyme Ab/Western Blot Reflex   Rocky mtn spotted fvr abs pnl(IgG+IgM)   Ehrlichia Antibody Panel   Babesia microti Antibody Panel   DG Knee Complete 4 Views Right   DG Ankle Complete Right   Edema, unspecified type       Given his calf is normal and no pain, risk of DVT low. Will check labs today- await results and x-ray. Treat as needed.    Relevant Orders   Comprehensive metabolic panel   CBC with Differential/Platelet   Caregiver burden       Needs to have sleep study and cares for his elderly father. Referral to [facility] made today for respite care.    Relevant Orders   Ambulatory referral to [facility]       Follow up plan: Return Pending Results.
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
## 2017-11-02 ENCOUNTER — Telehealth: Payer: Self-pay | Admitting: Family Medicine

## 2017-11-02 NOTE — Telephone Encounter (Signed)
Patient notified

## 2017-11-02 NOTE — Telephone Encounter (Signed)
Please let him know that his x-rays all came back normal. I'm still waiting on a bunch of his blood work, but so far, no sign of any gout or infection. I'm still waiting on the tick diseases and they should be back over the next couple of days. He should elevate his leg and ice it and rest and if he's not doing better in a couple of days he should let me know. He can continue his ibuprofen for pain or I can send him in some naproxen if he'd prefer. Thanks!

## 2017-11-03 LAB — CBC WITH DIFFERENTIAL/PLATELET
Basophils Absolute: 0 x10E3/uL (ref 0.0–0.2)
Basos: 0 %
EOS (ABSOLUTE): 0.1 x10E3/uL (ref 0.0–0.4)
Eos: 2 %
Hematocrit: 45.2 % (ref 37.5–51.0)
Hemoglobin: 15.8 g/dL (ref 13.0–17.7)
Immature Grans (Abs): 0 x10E3/uL (ref 0.0–0.1)
Immature Granulocytes: 0 %
Lymphocytes Absolute: 2.1 x10E3/uL (ref 0.7–3.1)
Lymphs: 28 %
MCH: 32.7 pg (ref 26.6–33.0)
MCHC: 35 g/dL (ref 31.5–35.7)
MCV: 94 fL (ref 79–97)
Monocytes Absolute: 0.9 x10E3/uL (ref 0.1–0.9)
Monocytes: 12 %
Neutrophils Absolute: 4.3 x10E3/uL (ref 1.4–7.0)
Neutrophils: 58 %
Platelets: 275 x10E3/uL (ref 150–379)
RBC: 4.83 x10E6/uL (ref 4.14–5.80)
RDW: 13.7 % (ref 12.3–15.4)
WBC: 7.4 x10E3/uL (ref 3.4–10.8)

## 2017-11-03 LAB — COMPREHENSIVE METABOLIC PANEL
ALT: 43 IU/L (ref 0–44)
AST: 78 IU/L — ABNORMAL HIGH (ref 0–40)
Albumin/Globulin Ratio: 1.7 (ref 1.2–2.2)
Albumin: 4 g/dL (ref 3.5–5.5)
Alkaline Phosphatase: 129 IU/L — ABNORMAL HIGH (ref 39–117)
BUN/Creatinine Ratio: 7 — ABNORMAL LOW (ref 9–20)
BUN: 7 mg/dL (ref 6–24)
Bilirubin Total: 0.2 mg/dL (ref 0.0–1.2)
CO2: 27 mmol/L (ref 20–29)
Calcium: 9.2 mg/dL (ref 8.7–10.2)
Chloride: 100 mmol/L (ref 96–106)
Creatinine, Ser: 0.96 mg/dL (ref 0.76–1.27)
GFR calc Af Amer: 109 mL/min/{1.73_m2} (ref >59)
GFR calc non Af Amer: 94 mL/min/{1.73_m2} (ref >59)
Globulin, Total: 2.3 g/dL (ref 1.5–4.5)
Glucose: 109 mg/dL — ABNORMAL HIGH (ref 65–99)
Potassium: 4.2 mmol/L (ref 3.5–5.2)
Sodium: 140 mmol/L (ref 134–144)
Total Protein: 6.3 g/dL (ref 6.0–8.5)

## 2017-11-03 LAB — EHRLICHIA ANTIBODY PANEL
E. Chaffeensis (HME) IgM Titer: NEGATIVE
E.Chaffeensis (HME) IgG: NEGATIVE
HGE IgG Titer: NEGATIVE
HGE IgM Titer: NEGATIVE

## 2017-11-03 LAB — BABESIA MICROTI ANTIBODY PANEL
Babesia microti IgG: <1:10 {titer}
Babesia microti IgM: <1:10 {titer}

## 2017-11-03 LAB — URIC ACID: Uric Acid: 4.9 mg/dL (ref 3.7–8.6)

## 2017-11-03 LAB — LYME AB/WESTERN BLOT REFLEX
LYME DISEASE AB, QUANT, IGM: <0.8 index (ref 0.00–0.79)
Lyme IgG/IgM Ab: <0.91 {ISR} (ref 0.00–0.90)

## 2017-11-03 LAB — ROCKY MTN SPOTTED FVR ABS PNL(IGG+IGM)
RMSF IgG: NEGATIVE
RMSF IgM: 0.59 index (ref 0.00–0.89)

## 2017-11-05 ENCOUNTER — Telehealth: Payer: Self-pay | Admitting: Family Medicine

## 2017-11-05 MED ORDER — PREDNISONE 50 MG PO TABS: 50.0000 mg | ORAL_TABLET | Freq: Every day | ORAL | 0 refills | Status: DC

## 2017-11-05 NOTE — Telephone Encounter (Signed)
Patient notified

## 2017-11-05 NOTE — Telephone Encounter (Signed)
Prednisone sent to his pharmacy, if not improving, may need to see ortho.

## 2017-11-05 NOTE — Telephone Encounter (Signed)
Please let him know that his tick diseases all came back normal. Have him let me know if he's not feeling better. Thanks!

## 2017-11-05 NOTE — Telephone Encounter (Signed)
Patient states that his foot is still swollen, he states that it is not really getting better.

## 2017-11-15 ENCOUNTER — Emergency Department
Admission: EM | Admit: 2017-11-15 | Discharge: 2017-11-15 | Disposition: A | Discharge status: Home / Self Care | Payer: Medicaid Other | Attending: Emergency Medicine | Admitting: Emergency Medicine

## 2017-11-15 ENCOUNTER — Emergency Department: Payer: Medicaid Other

## 2017-11-15 ENCOUNTER — Other Ambulatory Visit: Payer: Self-pay

## 2017-11-15 ENCOUNTER — Encounter: Payer: Self-pay | Admitting: Emergency Medicine

## 2017-11-15 DIAGNOSIS — Z791 Long term (current) use of non-steroidal anti-inflammatories (NSAID): Secondary | ICD-10-CM | POA: Insufficient documentation

## 2017-11-15 DIAGNOSIS — M546 Pain in thoracic spine: Secondary | ICD-10-CM | POA: Diagnosis not present

## 2017-11-15 DIAGNOSIS — F17210 Nicotine dependence, cigarettes, uncomplicated: Secondary | ICD-10-CM | POA: Diagnosis not present

## 2017-11-15 DIAGNOSIS — M609 Myositis, unspecified: Secondary | ICD-10-CM

## 2017-11-15 DIAGNOSIS — R197 Diarrhea, unspecified: Secondary | ICD-10-CM | POA: Diagnosis not present

## 2017-11-15 DIAGNOSIS — G8929 Other chronic pain: Secondary | ICD-10-CM | POA: Insufficient documentation

## 2017-11-15 DIAGNOSIS — M6088 Other myositis, other site: Secondary | ICD-10-CM | POA: Insufficient documentation

## 2017-11-15 DIAGNOSIS — Z79899 Other long term (current) drug therapy: Secondary | ICD-10-CM | POA: Diagnosis not present

## 2017-11-15 DIAGNOSIS — M549 Dorsalgia, unspecified: Secondary | ICD-10-CM | POA: Diagnosis present

## 2017-11-15 DIAGNOSIS — Z91040 Latex allergy status: Secondary | ICD-10-CM | POA: Diagnosis not present

## 2017-11-15 LAB — CBC WITH DIFFERENTIAL/PLATELET
Basophils Absolute: 0 10*3/uL (ref 0–0.1)
Basophils Relative: 0 %
Eosinophils Absolute: 0 10*3/uL (ref 0–0.7)
Eosinophils Relative: 0 %
HCT: 46.7 % (ref 40.0–52.0)
Hemoglobin: 16.4 g/dL (ref 13.0–18.0)
Lymphocytes Relative: 26 %
Lymphs Abs: 2.4 10*3/uL (ref 1.0–3.6)
MCH: 33.1 pg (ref 26.0–34.0)
MCHC: 35.2 g/dL (ref 32.0–36.0)
MCV: 93.9 fL (ref 80.0–100.0)
Monocytes Absolute: 1 10*3/uL (ref 0.2–1.0)
Monocytes Relative: 10 %
Neutro Abs: 6 10*3/uL (ref 1.4–6.5)
Neutrophils Relative %: 64 %
Platelets: 331 10*3/uL (ref 150–440)
RBC: 4.97 MIL/uL (ref 4.40–5.90)
RDW: 13 % (ref 11.5–14.5)
WBC: 9.5 10*3/uL (ref 3.8–10.6)

## 2017-11-15 LAB — COMPREHENSIVE METABOLIC PANEL
ALT: 22 U/L (ref 17–63)
AST: 25 U/L (ref 15–41)
Albumin: 3.8 g/dL (ref 3.5–5.0)
Alkaline Phosphatase: 103 U/L (ref 38–126)
Anion gap: 11 (ref 5–15)
BUN: 10 mg/dL (ref 6–20)
CO2: 25 mmol/L (ref 22–32)
Calcium: 9 mg/dL (ref 8.9–10.3)
Chloride: 106 mmol/L (ref 101–111)
Creatinine, Ser: 1.1 mg/dL (ref 0.61–1.24)
GFR calc Af Amer: >60 mL/min (ref >60)
GFR calc non Af Amer: >60 mL/min (ref >60)
Glucose, Bld: 97 mg/dL (ref 65–99)
Potassium: 3.8 mmol/L (ref 3.5–5.1)
Sodium: 142 mmol/L (ref 135–145)
Total Bilirubin: 0.7 mg/dL (ref 0.3–1.2)
Total Protein: 7.1 g/dL (ref 6.5–8.1)

## 2017-11-15 LAB — TROPONIN I: Troponin I: <0.03 ng/mL (ref <0.03)

## 2017-11-15 LAB — LIPASE, BLOOD: Lipase: 39 U/L (ref 11–51)

## 2017-11-15 MED ORDER — SODIUM CHLORIDE 0.9 % IV BOLUS (SEPSIS)
1000.0000 mL | Freq: Once | INTRAVENOUS | Status: AC
  Administered 2017-11-15: 1000 mL via INTRAVENOUS

## 2017-11-15 MED ORDER — KETOROLAC TROMETHAMINE 30 MG/ML IJ SOLN
30.0000 mg | Freq: Once | INTRAMUSCULAR | Status: AC
  Administered 2017-11-15: 30 mg via INTRAVENOUS
  Filled 2017-11-15: qty 1

## 2017-11-15 MED ORDER — OXYCODONE-ACETAMINOPHEN 5-325 MG PO TABS
1.0000 | ORAL_TABLET | Freq: Once | ORAL | Status: AC
  Administered 2017-11-15: 1 via ORAL
  Filled 2017-11-15: qty 1

## 2017-11-15 MED ORDER — OXYCODONE-ACETAMINOPHEN 5-325 MG PO TABS
1.0000 | ORAL_TABLET | Freq: Four times a day (QID) | ORAL | 0 refills | Status: DC | PRN
Start: 2017-11-15 — End: 2017-11-19

## 2017-11-15 NOTE — ED Notes (Signed)
Pt discharged to home.  Friend driving.  Discharge instructions reviewed.  Verbalized understanding.  No questions or concerns at this time.  Teach back verified.  Pt in NAD.  No items left in ED.   

## 2017-11-15 NOTE — Discharge Instructions (Signed)
Your MRI shows "myositis" or inflammation of the muscles in your back.  Continue the ibuprofen, and may take the Percocet if needed for more severe pain.  You should follow-up with your primary care doctor within the next several days.  Return to the emergency department for any new or worsening pain, any numbness or weakness, difficulty walking, or any worsening of the diarrhea, urinary symptoms, or incontinence.  The MRI report is copied here for your doctor: 1. Edema within the bilateral lumbar paraspinous muscles with at  least three small focal intramuscular fluid collections, the largest  of which measures up to 2.7 cm. These findings are consistent with a  nonspecific myositis, possibly infectious or traumatic in etiology.  If infectious, a pyogenic infection is thought to be less likely  given the relatively symmetric muscle involvement.  2. No acute osseous abnormality. No evidence of  osteomyelitis-diskitis or cauda equina syndrome.  3. Mild degenerative changes of the lower lumbar spine, slightly  progressed when compared to prior study. New mild central spinal  canal stenosis and moderate left lateral recess narrowing at L3-L4  which could irritate the descending left L4 nerve root.  4. New mild bilateral neuroforaminal stenosis at L4-L5. Progressed  moderate left neuroforaminal stenosis at L5-S1.
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
## 2017-11-15 NOTE — ED Notes (Signed)
This RN spoke with MRI tech.  Tech on way into facility and will screen pt for MRI at that time.

## 2017-11-19 ENCOUNTER — Ambulatory Visit (INDEPENDENT_AMBULATORY_CARE_PROVIDER_SITE_OTHER): Payer: Medicaid Other | Admitting: Family Medicine

## 2017-11-19 ENCOUNTER — Encounter: Payer: Self-pay | Admitting: Family Medicine

## 2017-11-19 VITALS — BP 156/101 | HR 79 | Temp 98.4°F | Wt 183.4 lb

## 2017-11-19 DIAGNOSIS — M609 Myositis, unspecified: Secondary | ICD-10-CM

## 2017-11-19 DIAGNOSIS — M546 Pain in thoracic spine: Secondary | ICD-10-CM

## 2017-11-19 DIAGNOSIS — E559 Vitamin D deficiency, unspecified: Secondary | ICD-10-CM | POA: Diagnosis not present

## 2017-11-19 MED ORDER — TIZANIDINE HCL 4 MG PO TABS: 4.0000 mg | ORAL_TABLET | Freq: Three times a day (TID) | ORAL | 1 refills | Status: DC | PRN

## 2017-11-19 MED ORDER — OXYCODONE-ACETAMINOPHEN 5-325 MG PO TABS: 1.0000 | ORAL_TABLET | Freq: Four times a day (QID) | ORAL | 0 refills | Status: DC | PRN

## 2017-11-19 NOTE — Assessment & Plan Note (Signed)
Rechecking levels today. Await results.  
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
## 2017-11-20 ENCOUNTER — Telehealth: Payer: Self-pay | Admitting: Family Medicine

## 2017-11-20 MED ORDER — VITAMIN D3 50 MCG (2000 UT) PO TABS: 1.0000 | ORAL_TABLET | Freq: Every day | ORAL | 4 refills | Status: DC

## 2017-11-20 NOTE — Telephone Encounter (Signed)
Patient Notified

## 2017-11-20 NOTE — Telephone Encounter (Signed)
Please let him know that his vitamin D was a little low, but everything else is normal. I've sent through a vitamin D supplement to his pharmacy.

## 2017-11-21 LAB — RA QN+CCP(IGG/A)+SJOSSA+SJOSSB
Cyclic Citrullin Peptide Ab: 12 units (ref 0–19)
ENA SSA (RO) Ab: <0.2 AI (ref 0.0–0.9)
ENA SSB (LA) Ab: <0.2 AI (ref 0.0–0.9)
Rhuematoid fact SerPl-aCnc: <10 IU/mL (ref 0.0–13.9)

## 2017-11-21 LAB — ANA: Anti Nuclear Antibody(ANA): NEGATIVE

## 2017-11-21 LAB — CK: Total CK: 98 U/L (ref 24–204)

## 2017-11-21 LAB — SEDIMENTATION RATE: Sed Rate: 8 mm/hr (ref 0–15)

## 2017-11-21 LAB — VITAMIN D 25 HYDROXY (VIT D DEFICIENCY, FRACTURES): Vit D, 25-Hydroxy: 27.7 ng/mL — ABNORMAL LOW (ref 30.0–100.0)

## 2017-11-26 ENCOUNTER — Telehealth: Payer: Self-pay | Admitting: Family Medicine

## 2017-11-26 MED ORDER — OXYCODONE-ACETAMINOPHEN 5-325 MG PO TABS: 1.0000 | ORAL_TABLET | Freq: Three times a day (TID) | ORAL | 0 refills | Status: DC | PRN

## 2017-11-26 NOTE — Telephone Encounter (Signed)
Can we please check on the status of this referral- there is nothing in the referral itself. Thanks!

## 2017-11-26 NOTE — Telephone Encounter (Signed)
Patient presents today without an appointment asking for refills on his medication. He states that he doesn't have an appointment with neurosurgery, but that it will be at least 3 weeks out.   PMP reviewed and appropriate.   Please let him know that this will be the last medication refill that we will give him without an appointment to discuss. I will get him enough medicine for 3 weeks which should get him to a neurosurgery appointment. However, even if that appointment is later, he will need an appointment for any further refills. Also- please remind him of the 48 hour notice needed for any refills. Should he need medication long term- as we have discussed previously, he will need to see pain management, and we can refer him elsewhere if he thinks he is going to need that.

## 2017-11-26 NOTE — Telephone Encounter (Signed)
Patient was given the script and also given the information from Dr Wynetta Emery regarding his script.

## 2017-11-26 NOTE — Telephone Encounter (Signed)
Called patient and gave him the number to call and schedule his appointment, he will let us know when his appointment is.

## 2017-11-26 NOTE — Telephone Encounter (Signed)
Copied from [place]. Topic: Inquiry >> Nov 26, 2017  9:13 AM Johnny [name], NT wrote: Reason for CRM: Patient would like a refill on percocet, has not heard from the spine Dr,  for an appointment he says he is having a lot of pain,, ,please call  [phone]

## 2017-11-26 NOTE — Telephone Encounter (Signed)
Copied from [place]. Topic: Inquiry >> Nov 26, 2017 11:04 AM Cecelia Byars, NT wrote: Reason for CRM: Patient called said he spoke with [facility] and was tod they have no appointments for 3 weeks, And will give him a call a back In a few days  he would like  refill on percocet  please call [phone] spoke with [facility]

## 2017-12-06 ENCOUNTER — Institutional Professional Consult (permissible substitution): Payer: Self-pay | Admitting: Neurology

## 2017-12-10 ENCOUNTER — Telehealth: Payer: Self-pay | Admitting: Family Medicine

## 2017-12-10 MED ORDER — GABAPENTIN 600 MG PO TABS: 600.0000 mg | ORAL_TABLET | Freq: Three times a day (TID) | ORAL | 3 refills | Status: DC

## 2017-12-10 NOTE — Telephone Encounter (Signed)
Higher dose sent to his pharmacy.

## 2017-12-10 NOTE — Telephone Encounter (Signed)
Copied from [place]. Topic: Quick Communication - Rx Refill/Question >> Dec 10, 2017 11:44 AM Oliver Pila B wrote: Reason for CRM: pt called to request to have his gabapentin (NEURONTIN) 400 MG capsule [[id number]]  dosage increased, contact pt to advise

## 2017-12-10 NOTE — Telephone Encounter (Signed)
Patient notified

## 2017-12-11 ENCOUNTER — Ambulatory Visit: Payer: Medicaid Other | Admitting: Family Medicine

## 2017-12-11 ENCOUNTER — Emergency Department
Admission: EM | Admit: 2017-12-11 | Discharge: 2017-12-11 | Disposition: A | Discharge status: Home / Self Care | Payer: Medicaid Other | Attending: Emergency Medicine | Admitting: Emergency Medicine

## 2017-12-11 ENCOUNTER — Encounter: Payer: Self-pay | Admitting: Emergency Medicine

## 2017-12-11 ENCOUNTER — Emergency Department: Payer: Medicaid Other

## 2017-12-11 ENCOUNTER — Encounter: Payer: Self-pay | Admitting: Family Medicine

## 2017-12-11 ENCOUNTER — Other Ambulatory Visit: Payer: Self-pay

## 2017-12-11 VITALS — BP 160/103 | HR 90 | Temp 98.3°F | Wt 188.2 lb

## 2017-12-11 DIAGNOSIS — F17210 Nicotine dependence, cigarettes, uncomplicated: Secondary | ICD-10-CM | POA: Insufficient documentation

## 2017-12-11 DIAGNOSIS — Z96651 Presence of right artificial knee joint: Secondary | ICD-10-CM | POA: Insufficient documentation

## 2017-12-11 DIAGNOSIS — M545 Low back pain: Secondary | ICD-10-CM | POA: Diagnosis not present

## 2017-12-11 DIAGNOSIS — G8929 Other chronic pain: Secondary | ICD-10-CM | POA: Diagnosis not present

## 2017-12-11 DIAGNOSIS — R079 Chest pain, unspecified: Secondary | ICD-10-CM | POA: Diagnosis not present

## 2017-12-11 DIAGNOSIS — Z79899 Other long term (current) drug therapy: Secondary | ICD-10-CM | POA: Diagnosis not present

## 2017-12-11 DIAGNOSIS — M546 Pain in thoracic spine: Secondary | ICD-10-CM

## 2017-12-11 DIAGNOSIS — R0789 Other chest pain: Secondary | ICD-10-CM | POA: Insufficient documentation

## 2017-12-11 DIAGNOSIS — Z91040 Latex allergy status: Secondary | ICD-10-CM | POA: Diagnosis not present

## 2017-12-11 DIAGNOSIS — Z89012 Acquired absence of left thumb: Secondary | ICD-10-CM | POA: Insufficient documentation

## 2017-12-11 DIAGNOSIS — M609 Myositis, unspecified: Secondary | ICD-10-CM

## 2017-12-11 LAB — CBC WITH DIFFERENTIAL/PLATELET
Basophils Absolute: 0 10*3/uL (ref 0–0.1)
Basophils Relative: 1 %
Eosinophils Absolute: 0.1 10*3/uL (ref 0–0.7)
Eosinophils Relative: 2 %
HCT: 44.2 % (ref 40.0–52.0)
Hemoglobin: 15.2 g/dL (ref 13.0–18.0)
Lymphocytes Relative: 30 %
Lymphs Abs: 2 10*3/uL (ref 1.0–3.6)
MCH: 33.3 pg (ref 26.0–34.0)
MCHC: 34.5 g/dL (ref 32.0–36.0)
MCV: 96.6 fL (ref 80.0–100.0)
Monocytes Absolute: 0.7 10*3/uL (ref 0.2–1.0)
Monocytes Relative: 10 %
Neutro Abs: 4 10*3/uL (ref 1.4–6.5)
Neutrophils Relative %: 59 %
Platelets: 143 10*3/uL — ABNORMAL LOW (ref 150–440)
RBC: 4.57 MIL/uL (ref 4.40–5.90)
RDW: 13 % (ref 11.5–14.5)
WBC: 6.9 10*3/uL (ref 3.8–10.6)

## 2017-12-11 LAB — BASIC METABOLIC PANEL
Anion gap: 8 (ref 5–15)
BUN: 10 mg/dL (ref 6–20)
CO2: 30 mmol/L (ref 22–32)
Calcium: 9.7 mg/dL (ref 8.9–10.3)
Chloride: 101 mmol/L (ref 101–111)
Creatinine, Ser: 1.04 mg/dL (ref 0.61–1.24)
GFR calc Af Amer: >60 mL/min (ref >60)
GFR calc non Af Amer: >60 mL/min (ref >60)
Glucose, Bld: 111 mg/dL — ABNORMAL HIGH (ref 65–99)
Potassium: 4.3 mmol/L (ref 3.5–5.1)
Sodium: 139 mmol/L (ref 135–145)

## 2017-12-11 LAB — TROPONIN I
Troponin I: <0.03 ng/mL (ref <0.03)
Troponin I: <0.03 ng/mL (ref <0.03)

## 2017-12-11 MED ORDER — PREDNISONE 10 MG PO TABS: ORAL_TABLET | ORAL | 0 refills | Status: DC

## 2017-12-11 MED ORDER — KETOROLAC TROMETHAMINE 30 MG/ML IJ SOLN
15.0000 mg | Freq: Once | INTRAMUSCULAR | Status: AC
  Administered 2017-12-11: 15 mg via INTRAVENOUS
  Filled 2017-12-11: qty 1

## 2017-12-11 MED ORDER — MORPHINE SULFATE (PF) 4 MG/ML IV SOLN
4.0000 mg | Freq: Once | INTRAVENOUS | Status: AC
  Administered 2017-12-11: 4 mg via INTRAVENOUS
  Filled 2017-12-11: qty 1

## 2017-12-11 MED ORDER — GADOBENATE DIMEGLUMINE 529 MG/ML IV SOLN
15.0000 mL | Freq: Once | INTRAVENOUS | Status: AC | PRN
  Administered 2017-12-11: 15 mL via INTRAVENOUS

## 2017-12-11 MED ORDER — ASPIRIN 81 MG PO CHEW
324.0000 mg | CHEWABLE_TABLET | Freq: Once | ORAL | Status: AC
  Administered 2017-12-11: 324 mg via ORAL
  Filled 2017-12-11: qty 4

## 2017-12-11 NOTE — ED Notes (Signed)
Patient transported to X-ray 

## 2017-12-11 NOTE — ED Provider Notes (Signed)
[facility] Emergency Department Provider Note  ____________________________________________   First MD Initiated Contact with Patient 12/11/17 1055     (approximate)  I have reviewed the triage vital signs and the nursing notes.   HISTORY  Chief Complaint Chest Pain    HPI Johnny [name]. is a 46 y.o. male with medical history as listed below who presents for evaluation of left-sided chest pain.  He was sent from his primary care doctor for further evaluation.  He reports that about 4 days ago he awoke with pain which she describes as starting in the middle of his back and radiating around through the left side of his chest.  He describes it as a sharp and stabbing pain that has been gradually getting worse and it makes it difficult for him to take deep breaths because of the pain.  He reports that it is severe even lying at rest and is worse with movement and exertion, and is tender to the touch.  He has had no rash in that area.  He denies any recent fever/chills, nausea, vomiting, abdominal pain, dysuria.  He presented to the emergency department about a month ago for pain in the middle of his back that was in a similar region but felt a bit different and was not radiating around to the chest and was not sharp.  The patient smokes but has no history of hypertension, diabetes, nor hyperlipidemia.  His father had a heart attack in his 97s.  The patient has no cardiac history and has not had any immobilizations or been on any long trips recently.    Past Medical History:  Diagnosis Date  . Bilateral sciatica   . Carpal tunnel syndrome   . Chronic back pain   . Chronic neck pain   . Chronic pain syndrome   . Herniated disc   . Traumatic amputation of left thumb 2013    Patient Active Problem List   Diagnosis Date Noted  . Chronic pain syndrome 08/07/2017  . Vitamin D deficiency 06/20/2017  . Insomnia 06/19/2017  . Bilateral sciatica     Past  Surgical History:  Procedure Laterality Date  . AMPUTATION FINGER / THUMB Left 2013  . HERNIA REPAIR    . HIP SURGERY    . JOINT REPLACEMENT Right    knee  . KNEE SURGERY    . neck fusion    . REPLACEMENT TOTAL KNEE Right   . SHOULDER SURGERY Right 2016 X 2  . SPINAL FUSION    . TONSILLECTOMY      Prior to Admission medications   Medication Sig Start Date End Date Taking? Authorizing Provider  Cholecalciferol (VITAMIN D3) 2000 units TABS Take 1 tablet by mouth daily. 11/20/17  Yes Johnson, Megan P, DO  gabapentin (NEURONTIN) 600 MG tablet Take 1 tablet (600 mg total) by mouth 3 (three) times daily. 12/10/17  Yes Johnson, Megan P, DO  ibuprofen (ADVIL,MOTRIN) 800 MG tablet Take 1 tablet (800 mg total) by mouth every 8 (eight) hours as needed. 09/17/17  Yes Johnson, Megan P, DO  oxyCODONE-acetaminophen (ROXICET) 5-325 MG tablet Take 1 tablet by mouth every 8 (eight) hours as needed for up to 21 days for severe pain. 11/26/17 12/17/17 Yes Johnson, Megan P, DO  tiZANidine (ZANAFLEX) 4 MG tablet Take 1 tablet (4 mg total) by mouth every 8 (eight) hours as needed for muscle spasms. 11/19/17  Yes Johnson, Megan P, DO  acetaminophen (TYLENOL) 325 MG tablet Take 650  mg by mouth every 4 (four) hours as needed.     [provider]  predniSONE (DELTASONE) 10 MG tablet Take 6 tabs (60 mg) PO x 3 days, then take 4 tabs (40 mg) PO x 3 days, then take 2 tabs (20 mg) PO x 3 days, then take 1 tab (10 mg) PO x 3 days, then take 1/2 tab (5 mg) PO x 4 days. 12/11/17   Hinda Kehr, MD    Allergies Flexeril [cyclobenzaprine]; Codeine; Latex; Penicillins; Tramadol; and Sulfamethoxazole-trimethoprim  Family History  Problem Relation Age of Onset  . Cancer Father        sarcoma  . Cirrhosis Paternal Grandmother   . Cancer Paternal Grandfather        Pancreatic  . Fibromyalgia Sister   . Deafness Sister     Social History Social History   Tobacco Use  . Smoking status: Current Every Day  Smoker    Packs/day: 0.50    Types: Cigarettes  . Smokeless tobacco: Never Used  Substance Use Topics  . Alcohol use: No  . Drug use: No    Review of Systems Constitutional: No fever/chills Eyes: No visual changes. ENT: No sore throat. Cardiovascular:  Respiratory: Denies shortness of breath, but pain with deep inspiration Gastrointestinal: No abdominal pain.  No nausea, no vomiting.  No diarrhea.  No constipation. Genitourinary: Negative for dysuria. Musculoskeletal: Negative for neck pain.  Chronic low back pain.  Pain in thoracic region radiating around and through the left side of his chest with severe chest wall tenderness throughout the left side of this torso Integumentary: Negative for rash. Neurological: Negative for headaches, focal weakness or numbness.   ____________________________________________   PHYSICAL EXAM:  VITAL SIGNS: ED Triage Vitals  Enc Vitals Group     BP 12/11/17 1024 (!) 156/111     Pulse Rate 12/11/17 1024 79     Resp 12/11/17 1024 20     Temp 12/11/17 1024 98.7 F (37.1 C)     Temp Source 12/11/17 1024 Oral     SpO2 12/11/17 1024 97 %     Weight --      Height --      Head Circumference --      Peak Flow --      Pain Score 12/11/17 1022 2     Pain Loc --      Pain Edu? --      Excl. in [place]? --     Constitutional: Alert and oriented. Generally healthy and well appearing but does appear uncomfortable Eyes: Conjunctivae are normal.  Head: Atraumatic. Nose: No congestion/rhinnorhea. Mouth/Throat: Mucous membranes are moist. Neck: No stridor.  No meningeal signs.   Cardiovascular: Normal rate, regular rhythm. Good peripheral circulation. Grossly normal heart sounds.  Severe chest wall tenderness all throughout left side of chest, both anterior and posterior. Respiratory: Normal respiratory effort.  No retractions. Lungs CTAB. Gastrointestinal: Soft and nontender. No distention.  Musculoskeletal: Severe chest wall tenderness all  throughout left side of chest, both anterior and posterior.  No palpable deformities of the C, T, nor L-spine.  No erythema or areas of fluctuance or induration around the spine. No lower extremity tenderness nor edema. No gross deformities of extremities. Neurologic:  Normal speech and language. No gross focal neurologic deficits are appreciated.  Skin:  Skin is warm, dry and intact. No rash noted. Psychiatric: Mood and affect are normal. Speech and behavior are normal.  ____________________________________________   LABS (all labs ordered are listed,  but only abnormal results are displayed)  Labs Reviewed  BASIC METABOLIC PANEL - Abnormal; Notable for the following components:      Result Value   Glucose, Bld 111 (*)    All other components within normal limits  CBC WITH DIFFERENTIAL/PLATELET - Abnormal; Notable for the following components:   Platelets 143 (*)    All other components within normal limits  TROPONIN I  TROPONIN I   ____________________________________________  EKG  ED ECG REPORT #1 I, Hinda Kehr, the attending physician, personally viewed and interpreted this ECG.  Date: 12/11/2017 EKG Time: 10:20 Rate: 76 Rhythm: normal sinus rhythm QRS Axis: normal Intervals: normal ST/T Wave abnormalities: Non-specific ST segment / T-wave changes, but no evidence of acute ischemia.  In particular, P-waves are tall in V3, and he has about 1-mm of ST elevation in V4-V6 as well as <1-mm elevation in III and aVF. Narrative Interpretation: Inferior leads appear unchanged from January, lateral leads do appear slightly different   ED ECG REPORT #2 I, Hinda Kehr, the attending physician, personally viewed and interpreted this ECG.  Date: 12/11/2017 EKG Time: 10:54 Rate: 75 Rhythm: normal sinus rhythm QRS Axis: normal Intervals: normal ST/T Wave abnormalities: Inverted T wave in lead III, new compared to prior.  Lead V3 is less pronounced at this time but the overall  upsweeping T-waves in the lateral leads, particularly V4-V5, are more pronounced than in previous EKG Narrative Interpretation: Does not meet criteria for STEMI, but changed from prior    ____________________________________________  RADIOLOGY   Dg Ribs Unilateral W/chest Left  Result Date: 12/11/2017 CLINICAL DATA:  Left chest pain, superior left anterior rib pain EXAM: LEFT RIBS AND CHEST - 3+ VIEW COMPARISON:  Chest radiographs dated 11/15/2017 FINDINGS: Lungs are clear.  No pleural effusion or pneumothorax. The heart is normal in size. No displaced left rib fracture is seen. IMPRESSION: Normal chest radiographs. No displaced left rib fracture is seen. Electronically Signed   By: Julian Hy M.D.   On: 12/11/2017 12:09   Mr Thoracic Spine W Wo Contrast  Result Date: 12/11/2017 CLINICAL DATA:  Left-sided chest pain radiating to the back. EXAM: MRI THORACIC WITHOUT AND WITH CONTRAST TECHNIQUE: Multiplanar and multiecho pulse sequences of the thoracic spine were obtained without and with intravenous contrast. CONTRAST:  103mL MULTIHANCE GADOBENATE DIMEGLUMINE 529 MG/ML IV SOLN COMPARISON:  11/15/2017 FINDINGS: MRI THORACIC SPINE FINDINGS Alignment:  Very minimal curvature. Vertebrae: No fracture or primary bone lesion. Scattered old endplate Schmorl's nodes without edema or enhancement. Cord:  No cord compression or primary cord lesion. Paraspinal and other soft tissues: No para spinous lesion or significant soft tissue finding. There is what looks like a small sebaceous cyst in the posterior subcutaneous fat to the left of midline behind C7. This should not be significant. Disc levels: No abnormality at T3-4 above. T4-5: Small left and disc bulge.  No stenosis. T5-6: Shallow left posterolateral disc protrusion without neural compression. This could contribute to back pain. T6-7:  Tiny central disc protrusion.  No neural compression. T7-8: Tiny central disc protrusion.  No neural compression.  T8-9 and T9-10:  Normal. T10-11:  Minimal disc bulge.  No stenosis. T11-12: Normal. No abnormal enhancement.  No change since the previous exam. IMPRESSION: No change since the study of 1 month ago. No acute finding. Ordinary mild degenerative changes in the thoracic region. Shallow left posterolateral disc protrusion at T5-6 could possibly be symptomatic. Electronically Signed   By: Nelson Chimes M.D.   On:  12/11/2017 14:46   Mr Lumbar Spine W Wo Contrast  Result Date: 12/11/2017 CLINICAL DATA:  Left-sided chest pain radiating to the back. EXAM: MRI LUMBAR SPINE WITHOUT AND WITH CONTRAST TECHNIQUE: Multiplanar and multiecho pulse sequences of the lumbar spine were obtained without and with intravenous contrast. CONTRAST:  67mL MULTIHANCE GADOBENATE DIMEGLUMINE 529 MG/ML IV SOLN COMPARISON:  11/15/2017 FINDINGS: Segmentation:  5 lumbar type vertebral bodies. Alignment:  Normal Vertebrae:  Mild anterior corner  edema at T12-L1 and L3-4. Conus medullaris and cauda equina: Conus extends to the T12-L1 level. Conus and cauda equina appear normal. Paraspinal and other soft tissues: Again demonstrated is edema and enhancement of the paraspinous musculature. The nature of this is uncertain. It could be due to any sort of myositis. Small fluid collections within the muscles previously seen are no longer present. Retroperitoneal structures appear normal. Disc levels: L1-2:  Normal. L2-3: Minimal disc bulge.  No stenosis. L3-4: Mild disc bulge. No canal stenosis. Mild bilateral foraminal narrowing without visible neural compression. Mild facet hypertrophy. L4-5: Disc degeneration with bulging of the disc. Mild bilateral foraminal narrowing without visible neural compression. Mild facet hypertrophy. L5-S1: Disc degeneration with loss of height. Circumferential bulging without canal or foraminal stenosis. Mild facet osteoarthritis. IMPRESSION: Re- demonstration of edema and enhancement of the posterior paraspinous  musculature. There are no longer any intramuscular fluid collections as were seen 1 month ago. The appearance is nonspecific and could be due to any sort of myositis. There are no new or progressive findings. 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 Electronically Signed   By: Nelson Chimes M.D.   On: 12/11/2017 14:56    ____________________________________________   PROCEDURES  Critical Care performed: No   Procedure(s) performed:   Procedures   ____________________________________________   INITIAL IMPRESSION / ASSESSMENT AND PLAN / ED COURSE  As part of my medical decision making, I reviewed the following data within the [facility] notes reviewed and incorporated, Labs reviewed , EKG interpreted , Old chart reviewed, Discussed with cardiologist (Dr. Saralyn Pilar), Notes from prior ED visits and [place] Controlled Substance Database    Differential diagnosis includes, but is not limited to, ACS, PE, pericarditis/myocarditis, pneumothorax, traumatic thoracic injury such as rib fractures or rib contusion, radicular pain radiating to the anterior chest from either an injury or infectious process such as osteomyelitis/discitis/epidural abscess in the thoracic spine, musculoskeletal strain in the chest wall, etc.  Initial troponin is negative and the patient has otherwise normal labs as well with no leukocytosis and normal electrolytes.  His EKG is are nonspecific but are changed from prior and the 2 EKGs obtained today about 30 minutes apart are slightly different from each other.  It is difficult to assess whether this represents dynamic EKG changes suggesting ACS or cardiac cause, or whether this is an issue of lead placement.  The patient's physical exam and history strongly suggest an alternate diagnosis other than ACS, such as musculoskeletal pain given how tender he is, but I am also  strongly considering the possibility of osteomyelitis/discitis and/or epidural abscess.  I reviewed the medical records and when he presented about a month ago, he had MRI studies without contrast of his thoracic and lumbar spine.  He had multiple abnormal findings thought to be either infectious or inflammatory myositis in his upper lumbar region.  If this was an infectious process which has spread, it could lead to discitis and/or even an epidural  abscess which is leading to the type of pain he is having which is a dermatomal pattern radiating from his back to his front.  Additionally, these sorts of slow progressing infectious processes can be insidious with minimal if any lab changes.  Patient does report occasional episodes of sweating or night sweats even though he denies fevers/chills.  Given the morbidity/mortality associated with a missed diagnosis of an infectious process in his spine, I will obtain MRIs of his thoracic and lumbar spine with and without contrast to fully evaluate for epidural abscesses as well comparing to prior to make sure that the myositis-like process has improved.  We will also plan for a repeat troponin.  Rib x-rays are also pending to make sure there is no evidence of rib fracture which would also explain at least some of his symptoms.  In the meantime I am giving the patient some Toradol, aspirin, and morphine to help with his symptoms.  Although aspirin and Toradol are both NSAIDs, the Toradol is relatively low dose and it should be therapeutic to do both and for the patient to also get the benefit of the antiplatelet therapy.     Clinical Course as of Dec 12 1547  Tue Dec 11, 2017  1104 I reviewed the patient's prescription history over the last 24 months in the multi-state controlled substances database(s) that includes [place], [place], [place], [place], [place], [place], [place], [place], [place], [place], [place], [place], [place],  [place], and [place].  Results were notable for 21 prescriptions for narcotics and/or benzodiazepines in the last 2 years.  Of note the patient has received 7 prescriptions for Percocet in the last 6 months for a total of 184 tablets of Percocet of both the 5 mg and 7.5 mg variety, prescribed by 5 different doctors including his primary care provider.  Most notably his primary care provider, Dr. Park Liter, wrote him a prescription for a 21-day supply (63 tablets) of Percocet which was dispensed 2 weeks ago.  [CF]  1216 Normal radiographs, proceeding with MRIs as described above. DG Ribs Unilateral W/Chest Left [CF]  1304 Of note, patient has requested something to eat.  I explained he needs to wait for the MRIs.  He still reports severe pain but is also very hungry.  [CF]  1456 No emergent findings.  Questionable disc protrustion at T5-6 that "could" be symptomatic according to radiology report. MR THORACIC SPINE W WO CONTRAST [CF]  1507 I spoke by phone with Dr. Saralyn Pilar and we discussed the case in detail.  I explained the patient's history of present illness as well as his past medical history, the results of the MRI and my thought process that led to it, and most importantly, we discussed the EKG changes.  He was reassured by the patient's negative troponins and the musculoskeletal nature of the pain.  He felt that with negative troponins, nonspecific EKG changes, highly reproducible musculoskeletal pain, and a low risk patient, I would be appropriate to follow-up as an outpatient and to try high-dose ibuprofen for the next several days though it does not look or behave like a typical pericarditis either.  I will update the patient at my next available opportunity.  [CF]  2683 I went when I went into check on the patient he was amatory around the room to adjust the TV.  He still reports 10 out of 10 pain but says that sometimes it also radiates to the right side of his rib cage as  well.  I gave  him the reassuring results regarding his MRIs and my discussion with the cardiologist.  The patient states that he already has an appointment scheduled with neurosurgery in [facility] so he does not need a referral to Dr. Cari Caraway as I was planning to provide for the small disc herniation.  I will give him a prednisone taper to see if this helps, particularly given the nonspecific myositis still present in the lumbar spine which does appear to have improved, and no signs of an infectious process.  I gave my usual customary return precautions.  I explained that I would not be able to provide any additional narcotics given his recent prescriptions from his primary care doctor and he states that he understands.  [CF]  2703 The patient asked if I could submit the records to his neurosurgery clinic, but I encouraged him to let the neurosurgery clinic know that they need to submit a request medical records.  I did, however, provide a CD with his MRIs from today.  [CF]    Clinical Course User Index [CF] Hinda Kehr, MD    ____________________________________________  FINAL CLINICAL IMPRESSION(S) / ED DIAGNOSES  Final diagnoses:  Atypical chest pain  Chest wall pain  Acute bilateral thoracic back pain  Chronic low back pain, unspecified back pain laterality, with sciatica presence unspecified  Myositis, unspecified myositis type, unspecified site     MEDICATIONS GIVEN DURING THIS VISIT:  Medications  ketorolac (TORADOL) 30 MG/ML injection 15 mg (15 mg Intravenous Given 12/11/17 1149)  morphine 4 MG/ML injection 4 mg (4 mg Intravenous Given 12/11/17 1149)  aspirin chewable tablet 324 mg (324 mg Oral Given 12/11/17 1149)  gadobenate dimeglumine (MULTIHANCE) injection 15 mL (15 mLs Intravenous Contrast Given 12/11/17 1421)     ED Discharge Orders        Ordered    predniSONE (DELTASONE) 10 MG tablet     12/11/17 1542       Note:  This document was prepared using Dragon voice  recognition software and may include unintentional dictation errors.    Hinda Kehr, MD 12/11/17 [id number]

## 2017-12-11 NOTE — ED Notes (Signed)
Patient transported to MRI 

## 2017-12-11 NOTE — ED Triage Notes (Signed)
Pt sent over from pcp for further eval of left sided chest pain radiating into back area. Pt sent over due to ecg changes.

## 2017-12-11 NOTE — ED Notes (Signed)
Patient screened by MRI over phone.

## 2017-12-11 NOTE — ED Notes (Signed)
Patient still in MRI.  

## 2017-12-11 NOTE — Discharge Instructions (Signed)
Your workup in the Emergency Department today was reassuring.  We did not find any specific abnormalities.  We recommend you drink plenty of fluids, take your regular medications (including your pain medications) and/or any new ones prescribed today, and follow up with the doctor(s) listed in these documents as recommended.    Return to the Emergency Department if you develop new or worsening symptoms that concern you.

## 2017-12-11 NOTE — Progress Notes (Signed)
BP (!) 160/103 (BP Location: Left Arm, Patient Position: Sitting, Cuff Size: Normal)   Pulse 90   Temp 98.3 F (36.8 C)   Wt 188 lb 3 oz (85.4 kg)   SpO2 97%   BMI 25.52 kg/m    Subjective:    Patient ID: Johnny Pronto., male    DOB: 12-Oct-1971, 46 y.o.   MRN: [id number]  HPI: Johnny Colden. is a 46 y.o. male  Chief Complaint  Patient presents with  . Chest Pain    left x 2 days   CHEST PAIN Time since onset: 2 days ago Duration:constant Onset: sudden Quality: sharp Severity: severe Location: L lower ribs Radiation: left arm and neck Episode duration: constant Frequency: constant Related to exertion: yes Activity when pain started: woke up with it- unsure if it woke him up Trauma: no Anxiety/recent stressors: yes Aggravating factors: moving arouns Alleviating factors: laying on his side Status: worse Treatments attempted: oxycodone, tizanidine   Current pain status: in pain Shortness of breath: no Cough: no Nausea: no Diaphoresis: yes Heartburn: no Palpitations: yes  Relevant past medical, surgical, family and social history reviewed and updated as indicated. Interim medical history since our last visit reviewed. Allergies and medications reviewed and updated.  Review of Systems  Constitutional: Negative.   Respiratory: Positive for chest tightness and shortness of breath. Negative for apnea, cough, choking, wheezing and stridor.   Cardiovascular: Positive for chest pain. Negative for palpitations and leg swelling.  Gastrointestinal: Negative.   Neurological: Negative.   Psychiatric/Behavioral: Negative.     Per HPI unless specifically indicated above     Objective:    BP (!) 160/103 (BP Location: Left Arm, Patient Position: Sitting, Cuff Size: Normal)   Pulse 90   Temp 98.3 F (36.8 C)   Wt 188 lb 3 oz (85.4 kg)   SpO2 97%   BMI 25.52 kg/m   Wt Readings from Last 3 Encounters:  12/11/17 188 lb 3 oz (85.4 kg)  11/19/17 183 lb 6 oz  (83.2 kg)  11/15/17 180 lb (81.6 kg)    Physical Exam  Constitutional: He is oriented to person, place, and time. He appears well-developed and well-nourished. No distress.  HENT:  Head: Normocephalic and atraumatic.  Right Ear: Hearing normal.  Left Ear: Hearing normal.  Nose: Nose normal.  Eyes: Conjunctivae and lids are normal. Right eye exhibits no discharge. Left eye exhibits no discharge. No scleral icterus.  Cardiovascular: Normal rate, regular rhythm, normal heart sounds and intact distal pulses. Exam reveals no gallop and no friction rub.  No murmur heard. Pulmonary/Chest: Effort normal. No respiratory distress. He has no wheezes. He has no rales. He exhibits tenderness (severe tenderness to extremely light palpation over ribs 8-10 on the L).  Musculoskeletal: Normal range of motion.  Neurological: He is alert and oriented to person, place, and time.  Skin: Skin is warm, dry and intact. No rash noted. He is not diaphoretic. No erythema. No pallor.  Psychiatric: He has a normal mood and affect. His speech is normal and behavior is normal. Judgment and thought content normal. Cognition and memory are normal.  Nursing note and vitals reviewed.   Results for orders placed or performed in visit on 11/19/17  Antinuclear Antib (ANA)  Result Value Ref Range   Anit Nuclear Antibody(ANA) Negative Negative  RA Qn+CCP(IgG/A)+SjoSSA+SjoSSB  Result Value Ref Range   Rhuematoid fact SerPl-aCnc <10.0 0.0 - 13.9 IU/mL   ENA SSA (RO) Ab <0.2 0.0 - 0.9  AI   ENA SSB (LA) Ab <0.2 0.0 - 0.9 AI   Cyclic Citrullin Peptide Ab 12 0 - 19 units  Sed Rate (ESR)  Result Value Ref Range   Sed Rate 8 0 - 15 mm/hr  CK (Creatine Kinase)  Result Value Ref Range   Total CK 98 24 - 204 U/L  VITAMIN D 25 Hydroxy (Vit-D Deficiency, Fractures)  Result Value Ref Range   Vit D, 25-Hydroxy 27.7 (L) 30.0 - 100.0 ng/mL      Assessment & Plan:   Problem List Items Addressed This Visit    None    Visit  Diagnoses    Chest pain, unspecified type    -  Primary   No rash, slight changes in EKG- will send to ER for further evaluation and troponins. Concern for shingles, rib fx (no bruising) or muscle strain.    Relevant Orders   EKG 12-Lead (Completed)       Follow up plan: Return Pending ER appointment.

## 2017-12-12 ENCOUNTER — Encounter: Payer: Self-pay | Admitting: Family Medicine

## 2017-12-12 ENCOUNTER — Ambulatory Visit
Admission: RE | Admit: 2017-12-12 | Discharge: 2017-12-12 | Disposition: A | Discharge status: Home / Self Care | Payer: Medicaid Other | Source: Ambulatory Visit | Attending: Family Medicine | Admitting: Family Medicine

## 2017-12-12 ENCOUNTER — Ambulatory Visit: Payer: Medicaid Other | Admitting: Family Medicine

## 2017-12-12 VITALS — BP 133/81 | HR 75 | Temp 98.5°F | Wt 188.0 lb

## 2017-12-12 DIAGNOSIS — M47892 Other spondylosis, cervical region: Secondary | ICD-10-CM | POA: Diagnosis not present

## 2017-12-12 DIAGNOSIS — R079 Chest pain, unspecified: Secondary | ICD-10-CM | POA: Diagnosis not present

## 2017-12-12 DIAGNOSIS — Z981 Arthrodesis status: Secondary | ICD-10-CM | POA: Diagnosis not present

## 2017-12-12 DIAGNOSIS — M542 Cervicalgia: Secondary | ICD-10-CM

## 2017-12-12 DIAGNOSIS — G894 Chronic pain syndrome: Secondary | ICD-10-CM | POA: Diagnosis not present

## 2017-12-12 MED ORDER — VALACYCLOVIR HCL 1 G PO TABS: 1000.0000 mg | ORAL_TABLET | Freq: Two times a day (BID) | ORAL | 0 refills | Status: DC

## 2017-12-12 MED ORDER — PREDNISONE 10 MG PO TABS: ORAL_TABLET | ORAL | 0 refills | Status: DC

## 2017-12-12 MED ORDER — OXYCODONE-ACETAMINOPHEN 5-325 MG PO TABS: ORAL_TABLET | ORAL | 0 refills | Status: DC

## 2017-12-12 MED ORDER — OXYCODONE-ACETAMINOPHEN 5-325 MG PO TABS: 1.0000 | ORAL_TABLET | Freq: Three times a day (TID) | ORAL | 0 refills | Status: DC | PRN

## 2017-12-12 NOTE — Assessment & Plan Note (Signed)
In exacerbation at this time due to his chest/thoracic pain. Will increase his medicine to every 4-6 hours (discussed how we'd prefer this is every 6 hours) until he sees neurosurgery on 12/31/17. We are aware that this Rx is early. Should not be due for refill on his medicine on 01/01/18

## 2017-12-12 NOTE — Progress Notes (Signed)
BP 133/81 (BP Location: Left Arm, Cuff Size: Normal)   Pulse 75   Temp 98.5 F (36.9 C)   Wt 188 lb (85.3 kg)   SpO2 99%   BMI 25.50 kg/m    Subjective:    Patient ID: Johnny [name]., [name]    DOB: [birth year], 46 y.o.   MRN: [id number]  HPI: Johnny Hedeen. is a 46 y.o. [name]  Chief Complaint  Patient presents with  . bulging disc   Johnny [name] comes in today after being sent to the ER yesterday for chest pain with EKG changes- Johnny had negative troponins and a negative work up, this was discussed with cardiology. Johnny also had MRIs done of his back again. Johnny is still feeling terrible with a lot of pain.   ER FOLLOW UP Time since discharge: 1 day Hospital/facility: [facility] Diagnosis: Chest pain Procedures/tests: EKGs, MRI lumbar and thoracic, rib x-rays Consultants: Cardiology New medications: prednisone Discharge instructions:  Follow up with neurosurgery Status: stable   Has been taking 1 tab every 4 hours instead of 1 every 8 hours- Johnny thinks Johnny has a few pills left, but not many. Johnny did not bring his pill bottle. Seeing neurosurgery on 12/31/17.  Relevant past medical, surgical, family and social history reviewed and updated as indicated. Interim medical history since our last visit reviewed. Allergies and medications reviewed and updated.  Review of Systems  Constitutional: Negative.   Respiratory: Negative.   Cardiovascular: Negative.   Musculoskeletal: Positive for back pain, myalgias, neck pain and neck stiffness. Negative for arthralgias, gait problem and joint swelling.  Skin: Negative.   Neurological: Negative.   Psychiatric/Behavioral: Negative.     Per HPI unless specifically indicated above     Objective:    BP 133/81 (BP Location: Left Arm, Cuff Size: Normal)   Pulse 75   Temp 98.5 F (36.9 C)   Wt 188 lb (85.3 kg)   SpO2 99%   BMI 25.50 kg/m   Wt Readings from Last 3 Encounters:  12/12/17 188 lb (85.3 kg)  12/11/17 188 lb 3 oz (85.4 kg)  11/19/17  183 lb 6 oz (83.2 kg)    Physical Exam  Constitutional: Johnny is oriented to person, place, and time. Johnny appears well-developed and well-nourished. No distress.  HENT:  Head: Normocephalic and atraumatic.  Right Ear: Hearing normal.  Left Ear: Hearing normal.  Nose: Nose normal.  Eyes: Conjunctivae and lids are normal. Right eye exhibits no discharge. Left eye exhibits no discharge. No scleral icterus.  Cardiovascular: Normal rate, regular rhythm, normal heart sounds and intact distal pulses. Exam reveals no gallop and no friction rub.  No murmur heard. Pulmonary/Chest: Effort normal and breath sounds normal. No respiratory distress. Johnny has no wheezes. Johnny has no rales. Johnny exhibits no tenderness.  Musculoskeletal: Normal range of motion. Johnny exhibits tenderness.  Neurological: Johnny is alert and oriented to person, place, and time.  Skin: Skin is warm, dry and intact. No rash noted. Johnny is not diaphoretic. No erythema. No pallor.  Psychiatric: Johnny has a normal mood and affect. His speech is normal and behavior is normal. Judgment and thought content normal. Cognition and memory are normal.  Nursing note and vitals reviewed.   Results for orders placed or performed during the hospital encounter of 01/74/94  Basic metabolic panel  Result Value Ref Range   Sodium 139 135 - 145 mmol/L   Potassium 4.3 3.5 - 5.1 mmol/L   Chloride 101 101 - 111  mmol/L   CO2 30 22 - 32 mmol/L   Glucose, Bld 111 (H) 65 - 99 mg/dL   BUN 10 6 - 20 mg/dL   Creatinine, Ser 1.04 0.61 - 1.24 mg/dL   Calcium 9.7 8.9 - 10.3 mg/dL   GFR calc non Af Amer >60 >60 mL/min   GFR calc Af Amer >60 >60 mL/min   Anion gap 8 5 - 15  Troponin I  Result Value Ref Range   Troponin I <0.03 <0.03 ng/mL  CBC with Differential  Result Value Ref Range   WBC 6.9 3.8 - 10.6 K/uL   RBC 4.57 4.40 - 5.90 MIL/uL   Hemoglobin 15.2 13.0 - 18.0 g/dL   HCT 44.2 40.0 - 52.0 %   MCV 96.6 80.0 - 100.0 fL   MCH 33.3 26.0 - 34.0 pg   MCHC 34.5 32.0  - 36.0 g/dL   RDW 13.0 11.5 - 14.5 %   Platelets 143 (L) 150 - 440 K/uL   Neutrophils Relative % 59 %   Neutro Abs 4.0 1.4 - 6.5 K/uL   Lymphocytes Relative 30 %   Lymphs Abs 2.0 1.0 - 3.6 K/uL   Monocytes Relative 10 %   Monocytes Absolute 0.7 0.2 - 1.0 K/uL   Eosinophils Relative 2 %   Eosinophils Absolute 0.1 0 - 0.7 K/uL   Basophils Relative 1 %   Basophils Absolute 0.0 0 - 0.1 K/uL  Troponin I  Result Value Ref Range   Troponin I <0.03 <0.03 ng/mL      Assessment & Plan:   Problem List Items Addressed This Visit      Other   Chronic pain syndrome    In exacerbation at this time due to his chest/thoracic pain. Will increase his medicine to every 4-6 hours (discussed how we'd prefer this is every 6 hours) until Johnny sees neurosurgery on 12/31/17. We are aware that this Rx is early. Should not be due for refill on his medicine on 01/01/18       Other Visit Diagnoses    Chest pain, unspecified type    -  Primary   Still concerned about shingles, will treat with valtrex. Given his pain, will increase pain managment until his neurosurgery appointment in 12/31/17.   Neck pain       Concern about the popping. No x-rays recently. Will obtain new ones today. Await results.    Relevant Orders   DG Cervical Spine Complete       Follow up plan: Return 01/01/18.

## 2017-12-13 ENCOUNTER — Telehealth: Payer: Self-pay | Admitting: Family Medicine

## 2017-12-13 NOTE — Telephone Encounter (Signed)
Called and left a message letting patient know his xray results.  

## 2017-12-13 NOTE — Telephone Encounter (Signed)
Please let him that his x-ray shows some arthritis, but there is no sign of any problems with his fusion. Thanks!

## 2017-12-26 ENCOUNTER — Other Ambulatory Visit: Payer: Self-pay

## 2017-12-26 ENCOUNTER — Emergency Department: Payer: Medicaid Other

## 2017-12-26 ENCOUNTER — Emergency Department
Admission: EM | Admit: 2017-12-26 | Discharge: 2017-12-26 | Disposition: A | Discharge status: Home / Self Care | Payer: Medicaid Other | Attending: Emergency Medicine | Admitting: Emergency Medicine

## 2017-12-26 ENCOUNTER — Encounter: Payer: Self-pay | Admitting: Radiology

## 2017-12-26 DIAGNOSIS — Z96651 Presence of right artificial knee joint: Secondary | ICD-10-CM | POA: Diagnosis not present

## 2017-12-26 DIAGNOSIS — R112 Nausea with vomiting, unspecified: Secondary | ICD-10-CM

## 2017-12-26 DIAGNOSIS — F17210 Nicotine dependence, cigarettes, uncomplicated: Secondary | ICD-10-CM | POA: Insufficient documentation

## 2017-12-26 DIAGNOSIS — Z96641 Presence of right artificial hip joint: Secondary | ICD-10-CM | POA: Insufficient documentation

## 2017-12-26 DIAGNOSIS — K8689 Other specified diseases of pancreas: Secondary | ICD-10-CM

## 2017-12-26 DIAGNOSIS — R109 Unspecified abdominal pain: Secondary | ICD-10-CM | POA: Insufficient documentation

## 2017-12-26 DIAGNOSIS — Z5321 Procedure and treatment not carried out due to patient leaving prior to being seen by health care provider: Secondary | ICD-10-CM | POA: Insufficient documentation

## 2017-12-26 DIAGNOSIS — Z91040 Latex allergy status: Secondary | ICD-10-CM | POA: Diagnosis not present

## 2017-12-26 DIAGNOSIS — R197 Diarrhea, unspecified: Secondary | ICD-10-CM

## 2017-12-26 DIAGNOSIS — Z79899 Other long term (current) drug therapy: Secondary | ICD-10-CM | POA: Diagnosis not present

## 2017-12-26 DIAGNOSIS — Z89012 Acquired absence of left thumb: Secondary | ICD-10-CM | POA: Diagnosis not present

## 2017-12-26 LAB — GASTROINTESTINAL PANEL BY PCR, STOOL (REPLACES STOOL CULTURE)

## 2017-12-26 LAB — URINALYSIS, COMPLETE (UACMP) WITH MICROSCOPIC
Bacteria, UA: NONE SEEN
Bilirubin Urine: NEGATIVE
Glucose, UA: NEGATIVE mg/dL
Hgb urine dipstick: NEGATIVE
Ketones, ur: NEGATIVE mg/dL
Leukocytes, UA: NEGATIVE
Nitrite: NEGATIVE
Protein, ur: NEGATIVE mg/dL
Specific Gravity, Urine: 1.015 (ref 1.005–1.030)
pH: 5 (ref 5.0–8.0)

## 2017-12-26 LAB — LIPASE, BLOOD
Lipase: 39 U/L (ref 11–51)
Lipase: 45 U/L (ref 11–51)

## 2017-12-26 LAB — CBC
HCT: 45.7 % (ref 40.0–52.0)
Hemoglobin: 15.6 g/dL (ref 13.0–18.0)
MCH: 32.4 pg (ref 26.0–34.0)
MCHC: 34.2 g/dL (ref 32.0–36.0)
MCV: 94.6 fL (ref 80.0–100.0)
Platelets: 266 10*3/uL (ref 150–440)
RBC: 4.83 MIL/uL (ref 4.40–5.90)
RDW: 13.6 % (ref 11.5–14.5)
WBC: 13.3 10*3/uL — ABNORMAL HIGH (ref 3.8–10.6)

## 2017-12-26 LAB — COMPREHENSIVE METABOLIC PANEL
ALT: 32 U/L (ref 17–63)
AST: 40 U/L (ref 15–41)
Albumin: 4.1 g/dL (ref 3.5–5.0)
Alkaline Phosphatase: 93 U/L (ref 38–126)
Anion gap: 12 (ref 5–15)
BUN: 10 mg/dL (ref 6–20)
CO2: 24 mmol/L (ref 22–32)
Calcium: 9.8 mg/dL (ref 8.9–10.3)
Chloride: 103 mmol/L (ref 101–111)
Creatinine, Ser: 0.89 mg/dL (ref 0.61–1.24)
GFR calc Af Amer: >60 mL/min (ref >60)
GFR calc non Af Amer: >60 mL/min (ref >60)
Glucose, Bld: 112 mg/dL — ABNORMAL HIGH (ref 65–99)
Potassium: 3.6 mmol/L (ref 3.5–5.1)
Sodium: 139 mmol/L (ref 135–145)
Total Bilirubin: 0.9 mg/dL (ref 0.3–1.2)
Total Protein: 7.6 g/dL (ref 6.5–8.1)

## 2017-12-26 LAB — TROPONIN I: Troponin I: <0.03 ng/mL (ref <0.03)

## 2017-12-26 LAB — C DIFFICILE QUICK SCREEN W PCR REFLEX
C Diff antigen: NEGATIVE
C Diff interpretation: NOT DETECTED
C Diff toxin: NEGATIVE

## 2017-12-26 LAB — LACTIC ACID, PLASMA: Lactic Acid, Venous: 1.1 mmol/L (ref 0.5–1.9)

## 2017-12-26 MED ORDER — IOPAMIDOL (ISOVUE-300) INJECTION 61%
100.0000 mL | Freq: Once | INTRAVENOUS | Status: AC | PRN
  Administered 2017-12-26: 100 mL via INTRAVENOUS

## 2017-12-26 MED ORDER — MORPHINE SULFATE (PF) 4 MG/ML IV SOLN
4.0000 mg | Freq: Once | INTRAVENOUS | Status: AC
  Administered 2017-12-26: 4 mg via INTRAVENOUS
  Filled 2017-12-26: qty 1

## 2017-12-26 MED ORDER — ONDANSETRON HCL 4 MG/2ML IJ SOLN
4.0000 mg | Freq: Once | INTRAMUSCULAR | Status: AC
  Administered 2017-12-26: 4 mg via INTRAVENOUS
  Filled 2017-12-26: qty 2

## 2017-12-26 MED ORDER — SODIUM CHLORIDE 0.9 % IV BOLUS (SEPSIS)
1000.0000 mL | Freq: Once | INTRAVENOUS | Status: AC
  Administered 2017-12-26: 1000 mL via INTRAVENOUS

## 2017-12-26 MED ORDER — OXYCODONE-ACETAMINOPHEN 5-325 MG PO TABS: 1.0000 | ORAL_TABLET | Freq: Four times a day (QID) | ORAL | 0 refills | Status: DC | PRN

## 2017-12-26 MED ORDER — IOPAMIDOL (ISOVUE-300) INJECTION 61%
30.0000 mL | Freq: Once | INTRAVENOUS | Status: AC | PRN
  Administered 2017-12-26: 30 mL via ORAL

## 2017-12-26 MED ORDER — ONDANSETRON 4 MG PO TBDP: 4.0000 mg | ORAL_TABLET | Freq: Three times a day (TID) | ORAL | 0 refills | Status: DC | PRN

## 2017-12-26 NOTE — ED Provider Notes (Signed)
[facility] Emergency Department Provider Note   ____________________________________________   First MD Initiated Contact with Patient 12/26/17 [phone]     (approximate)  I have reviewed the triage vital signs and the nursing notes.   HISTORY  Chief Complaint No chief complaint on file. chief complaint is abdominal pain  HPI Johnny [name]. is a 47 y.o. male who reports she's been having nausea vomiting diarrhea and severe crampy pain going up into his chest especially from the left side of his abdomen. It's worse if he lays on his left side. He's not been able to keep very much down at all.not been noted to keep very much down at all. He has had any blood in his stool he's had a lot of sweating and he thinks fever. Almost anything he drinks makes him go to have a diarrheal stool.   Past Medical History:  Diagnosis Date  . Bilateral sciatica   . Carpal tunnel syndrome   . Chronic back pain   . Chronic neck pain   . Chronic pain syndrome   . Herniated disc   . Traumatic amputation of left thumb 2013    Patient Active Problem List   Diagnosis Date Noted  . Chronic pain syndrome 08/07/2017  . Vitamin D deficiency 06/20/2017  . Insomnia 06/19/2017  . Bilateral sciatica     Past Surgical History:  Procedure Laterality Date  . AMPUTATION FINGER / THUMB Left 2013  . HERNIA REPAIR    . HIP SURGERY    . JOINT REPLACEMENT Right    knee  . KNEE SURGERY    . neck fusion    . REPLACEMENT TOTAL KNEE Right   . SHOULDER SURGERY Right 2016 X 2  . SPINAL FUSION    . TONSILLECTOMY      Prior to Admission medications   Medication Sig Start Date End Date Taking? Authorizing Provider  acetaminophen (TYLENOL) 325 MG tablet Take 650 mg by mouth every 4 (four) hours as needed.     [provider]  Cholecalciferol (VITAMIN D3) 2000 units TABS Take 1 tablet by mouth daily. 11/20/17   Johnson, Megan P, DO  gabapentin (NEURONTIN) 600 MG tablet Take 1  tablet (600 mg total) by mouth 3 (three) times daily. 12/10/17   Johnson, Megan P, DO  ibuprofen (ADVIL,MOTRIN) 800 MG tablet Take 1 tablet (800 mg total) by mouth every 8 (eight) hours as needed. 09/17/17   Johnson, Megan P, DO  ondansetron (ZOFRAN ODT) 4 MG disintegrating tablet Take 1 tablet (4 mg total) by mouth every 8 (eight) hours as needed for nausea or vomiting. 12/26/17   Nena Polio, MD  oxyCODONE-acetaminophen (ROXICET) 5-325 MG tablet 1 tablet every 4-6 hours PRN severe pain 12/12/17   Johnson, Megan P, DO  oxyCODONE-acetaminophen (ROXICET) 5-325 MG tablet Take 1 tablet by mouth every 6 (six) hours as needed. 12/26/17 12/26/18  Nena Polio, MD  predniSONE (DELTASONE) 10 MG tablet Take 6 tabs PO x3 days, then take 4 tabs PO x3 days, then take 2 tabs PO x3 days, then take 1 tab PO x3 days, then take 1/2 tab PO x4 days. 12/12/17   Johnson, Megan P, DO  tiZANidine (ZANAFLEX) 4 MG tablet Take 1 tablet (4 mg total) by mouth every 8 (eight) hours as needed for muscle spasms. 11/19/17   Johnson, Megan P, DO  valACYclovir (VALTREX) 1000 MG tablet Take 1 tablet (1,000 mg total) by mouth 2 (two) times daily. 12/12/17  Johnson, Megan P, DO    Allergies Flexeril [cyclobenzaprine]; Codeine; Latex; Penicillins; Tramadol; and Sulfamethoxazole-trimethoprim  Family History  Problem Relation Age of Onset  . Cancer Father        sarcoma  . Cirrhosis Paternal Grandmother   . Cancer Paternal Grandfather        Pancreatic  . Fibromyalgia Sister   . Deafness Sister     Social History Social History   Tobacco Use  . Smoking status: Current Every Day Smoker    Packs/day: 0.50    Types: Cigarettes  . Smokeless tobacco: Never Used  Substance Use Topics  . Alcohol use: No  . Drug use: No    Review of Systems  Constitutional:  fever/chills Eyes: No visual changes. ENT: No sore throat. Cardiovascular: Denies chest pain. Respiratory: Denies shortness of breath. Gastrointestinalsee history  of present illness Genitourinary: Negative for dysuria. Musculoskeletal: Negative for back pain. Skin: Negative for rash. Neurological: Negative for headaches, focal weakness   ____________________________________________   PHYSICAL EXAM:  VITAL SIGNS: ED Triage Vitals  Enc Vitals Group     BP 12/26/17 0819 (!) 153/97     Pulse Rate 12/26/17 0819 97     Resp --      Temp --      Temp src --      SpO2 12/26/17 0819 98 %     Weight --      Height --      Head Circumference --      Peak Flow --      Pain Score 12/26/17 0824 8     Pain Loc --      Pain Edu? --      Excl. in [place]? --     Constitutional: Alert and oriented. Well appearing and in no acute distress. Eyes: Conjunctivae are normal.  Head: Atraumatic. Nose: No congestion/rhinnorhea. Mouth/Throat: Mucous membranes are moist.  Oropharynx non-erythematous. Neck: No stridor.  Cardiovascular: Normal rate, regular rhythm. Grossly normal heart sounds.  Good peripheral circulation. Respiratory: Normal respiratory effort.  No retractions. Lungs CTAB. Gastrointestinal: Soft and nontender. No distention. No abdominal bruits. No CVA tenderness. Musculoskeletal: No lower extremity tenderness nor edema.  No joint effusions. Neurologic:  Normal speech and language. No gross focal neurologic deficits are appreciated. No gait instability. Skin:  Skin is warm, dry and intact. No rash noted. Psychiatric: Mood and affect are normal. Speech and behavior are normal.  ____________________________________________   LABS (all labs ordered are listed, but only abnormal results are displayed)  Labs Reviewed  GASTROINTESTINAL PANEL BY PCR, STOOL (REPLACES STOOL CULTURE)  C DIFFICILE QUICK SCREEN W PCR REFLEX  TROPONIN I  LACTIC ACID, PLASMA  LIPASE, BLOOD   ____________________________________________  EKG   ____________________________________________  RADIOLOGY  Ct Abdomen Pelvis W Contrast  Result Date:  12/26/2017 CLINICAL DATA:  47 year old male with history of left lower quadrant abdominal pain since last Thursday, with recent history of nausea, vomiting and diarrhea. EXAM: CT ABDOMEN AND PELVIS WITH CONTRAST TECHNIQUE: Multidetector CT imaging of the abdomen and pelvis was performed using the standard protocol following bolus administration of intravenous contrast. CONTRAST:  129mL ISOVUE-300 IOPAMIDOL (ISOVUE-300) INJECTION 61% COMPARISON:  None. FINDINGS: Lower chest: Unremarkable. Hepatobiliary: No suspicious cystic or solid hepatic lesions. No intrahepatic biliary ductal dilatation. Common bile duct is mildly dilated measuring 10 mm in the porta hepatis. Gallbladder is normal in appearance. Pancreas: In the head of the pancreas in the periampullary region there is a hypovascular area measuring 19 x  14 x 13 mm (coronal image 43 of series 5 and axial image 28 of series 2), concerning for potential neoplasm. This is associated with common bile duct dilatation as well as diffuse pancreatic ductal dilatation which measures up to 7 mm in the head of the pancreas. No peripancreatic inflammatory changes. Spleen: Unremarkable. Adrenals/Urinary Tract: Subcentimeter low-attenuation lesion in the upper pole of the right kidney is too small to characterize, but is statistically likely a cyst. Left kidney and bilateral adrenal glands are normal in appearance. No hydroureteronephrosis. Urinary bladder is normal in appearance. Stomach/Bowel: Normal appearance of the stomach. Duodenum appears diffusely thickened, particularly in the second portion. No definite surrounding inflammatory changes are appreciated. No pathologic dilatation of other portions of small bowel or colon. Normal appendix. Vascular/Lymphatic: Aortic atherosclerosis, without evidence of aneurysm or dissection in the abdominal or pelvic vasculature. No lymphadenopathy noted in the abdomen or pelvis. Reproductive: Prostate gland seminal vesicles are  unremarkable in appearance. Other: No significant volume of ascites.  No pneumoperitoneum. Musculoskeletal: There are no aggressive appearing lytic or blastic lesions noted in the visualized portions of the skeleton. IMPRESSION: 1. 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 Further evaluation with EGD, endoscopic ultrasound and potential biopsy is strongly recommended in the near future. 2. In addition, there is some mild thickening of the duodenal wall, which could suggest duodenitis, however, there are no overt surrounding inflammatory changes noted at this time. 3. Aortic atherosclerosis. 4. Normal appendix. These results were called by telephone at the time of interpretation on 12/26/2017 at 12:36 pm to Dr. Conni Slipper, who verbally acknowledged these results. Aortic Atherosclerosis (ICD10-I70.0). Electronically Signed   By: Vinnie Langton M.D.   On: 12/26/2017 12:35   patient appears to have pancreatic cancer. ____________________________________________   PROCEDURES  Procedure(s) performed:   Procedures  Critical Care performed:   ____________________________________________   INITIAL IMPRESSION / ASSESSMENT AND PLAN / ED COURSE  discussed CT results with patient and Dr. Blair Dolphin. Since the lab work is okay Dr. Allen Norris had agreed to begin the evaluation.patient is aware of all this. He will follow-up he is aware of the seriousness of the problem.      ____________________________________________   FINAL CLINICAL IMPRESSION(S) / ED DIAGNOSES  Final diagnoses:  Diarrhea, unspecified type  Non-intractable vomiting with nausea, unspecified vomiting type  Abdominal pain, unspecified abdominal location  Pancreatic mass     ED Discharge Orders        Ordered    ondansetron (ZOFRAN ODT) 4  MG disintegrating tablet  Every 8 hours PRN     12/26/17 1420    oxyCODONE-acetaminophen (ROXICET) 5-325 MG tablet  Every 6 hours PRN     12/26/17 1420       Note:  This document was prepared using Dragon voice recognition software and may include unintentional dictation errors.    Nena Polio, MD 12/26/17 1721

## 2017-12-26 NOTE — ED Notes (Signed)
Patient transported to CT 

## 2017-12-26 NOTE — ED Notes (Signed)
Pt information incorrectly entered during registration process. IT contacted and pt records will be merged by IT.

## 2017-12-26 NOTE — Discharge Instructions (Signed)
Use the Percocet 1 pill 4 times a day as needed for pain. This may also slow down the diarrhea somewhat. Use the Zofran melt on your tongue wafers 1 wafer 3 times a day as needed for nausea and vomiting. Continue to drink small amounts of liquid frequently. If you can tolerate any solids go ahead and try them. Return for worse pain fever unable to keep down anything at all or if iyou get weak or lightheaded or feel sicker. Give Dr. Allen Norris a call today to schedule a follow-up. I have spoken with him about the mass in your pancreas.

## 2017-12-26 NOTE — ED Triage Notes (Signed)
Pt in with co generalized abd pain since last Thursday, no vomiting but has had diarrhea.

## 2017-12-27 ENCOUNTER — Telehealth: Payer: Self-pay | Admitting: Gastroenterology

## 2017-12-27 NOTE — Telephone Encounter (Signed)
Patient left a voice message that he is returning your call. He has court at nine then he will be home.

## 2017-12-31 ENCOUNTER — Telehealth: Payer: Self-pay | Admitting: Gastroenterology

## 2017-12-31 ENCOUNTER — Ambulatory Visit: Payer: Medicaid Other | Admitting: Gastroenterology

## 2017-12-31 ENCOUNTER — Encounter: Payer: Self-pay | Admitting: Gastroenterology

## 2017-12-31 ENCOUNTER — Other Ambulatory Visit: Payer: Self-pay

## 2017-12-31 ENCOUNTER — Other Ambulatory Visit
Admission: RE | Admit: 2017-12-31 | Discharge: 2017-12-31 | Disposition: A | Discharge status: Home / Self Care | Payer: Medicaid Other | Source: Ambulatory Visit | Attending: Gastroenterology | Admitting: Gastroenterology

## 2017-12-31 VITALS — BP 135/83 | HR 70 | Ht 72.0 in | Wt 179.0 lb

## 2017-12-31 DIAGNOSIS — K8689 Other specified diseases of pancreas: Secondary | ICD-10-CM

## 2017-12-31 DIAGNOSIS — R112 Nausea with vomiting, unspecified: Secondary | ICD-10-CM | POA: Diagnosis not present

## 2017-12-31 DIAGNOSIS — K869 Disease of pancreas, unspecified: Secondary | ICD-10-CM | POA: Diagnosis not present

## 2017-12-31 DIAGNOSIS — R197 Diarrhea, unspecified: Secondary | ICD-10-CM

## 2017-12-31 MED ORDER — OXYCODONE-ACETAMINOPHEN 5-325 MG PO TABS: 1.0000 | ORAL_TABLET | Freq: Four times a day (QID) | ORAL | 0 refills | Status: DC | PRN

## 2017-12-31 MED ORDER — ONDANSETRON 4 MG PO TBDP: 4.0000 mg | ORAL_TABLET | Freq: Three times a day (TID) | ORAL | 1 refills | Status: DC | PRN

## 2017-12-31 MED ORDER — PEG 3350-KCL-NABCB-NACL-NASULF 236 G PO SOLR: ORAL | 0 refills | Status: DC

## 2017-12-31 MED ORDER — PANTOPRAZOLE SODIUM 40 MG PO TBEC: 40.0000 mg | DELAYED_RELEASE_TABLET | Freq: Every day | ORAL | 5 refills | Status: DC

## 2017-12-31 NOTE — Addendum Note (Signed)
Addended by: Elmo Putt on: 12/31/2017 02:48 PM   Modules accepted: Orders

## 2017-12-31 NOTE — H&P (View-Only) (Signed)
Gastroenterology Consultation  Referring Provider:     Valerie Roys, Johnny [name] Primary Care Physician:  Johnny Roys, Johnny [name] Primary Gastroenterologist:  Johnny [name]     Reason for Consultation:     Nausea vomiting diarrhea        HPI:   Johnny Strey. is a 47 y.o. y/o male referred for consultation & management of nausea vomiting diarrhea by Dr. Wynetta [name], Johnny [name], Johnny [name].  This patient comes in today after being in the ER for nausea vomiting and diarrhea on January 2 of 2019. The patient had previously been in the ER on 12/11/2017 for chest pain. The patient had reported that he was having crampy chest pain that went to the left side of his abdomen. He reports the pain to have been crampy. In the emergency room the patient had reported that he wasn't able to keep much food or drink down. He also had reported that after he ate or drank he would have to run to the bathroom. The patient reports that he has approximate 10 bowel movements a day and has seen some blood. The patient also reports that the diarrhea wakes him up from a sound sleep approximate 3 times a night. The patient has not lost any weight as reported by him but appears to run between 180-190 pounds. The patient is 89 today. He reports the abdominal pain starts in the left lower abdomen and radiates up to his left upper quadrant. He also reports that the diarrhea has been more smelly than previously. The patient had C. difficile checked which was negative and he had a GI panel which was also negative. The patient does report that he had a few years of drinking a sixpack a day but hasn't done that for some time.  Past Medical History:  Diagnosis Date  . Bilateral sciatica   . Carpal tunnel syndrome   . Chronic back pain   . Chronic neck pain   . Chronic pain syndrome   . Herniated disc   . Traumatic amputation of left thumb 2013    Past Surgical History:  Procedure Laterality Date  . AMPUTATION FINGER / THUMB Left 2013  .  HERNIA REPAIR    . HIP SURGERY    . JOINT REPLACEMENT Right    knee  . KNEE SURGERY    . neck fusion    . REPLACEMENT TOTAL KNEE Right   . SHOULDER SURGERY Right 2016 X 2  . SPINAL FUSION    . TONSILLECTOMY      Prior to Admission medications   Medication Sig Start Date End Date Taking? Authorizing Provider  acetaminophen (TYLENOL) 325 MG tablet Take 650 mg by mouth every 4 (four) hours as needed.     Provider, Historical, Johnny [name]  Cholecalciferol (VITAMIN D3) 2000 units TABS Take 1 tablet by mouth daily. 11/20/17   Johnny [name], Johnny [name], Johnny [name]  gabapentin (NEURONTIN) 600 MG tablet Take 1 tablet (600 mg total) by mouth 3 (three) times daily. 12/10/17   Johnny [name], Johnny [name], Johnny [name]  ibuprofen (ADVIL,MOTRIN) 800 MG tablet Take 1 tablet (800 mg total) by mouth every 8 (eight) hours as needed. 09/17/17   Johnny [name], Johnny [name], Johnny [name]  ondansetron (ZOFRAN ODT) 4 MG disintegrating tablet Take 1 tablet (4 mg total) by mouth every 8 (eight) hours as needed for nausea or vomiting. 12/26/17   Johnny Polio, Johnny [name]  oxyCODONE-acetaminophen (ROXICET) 5-325 MG tablet 1 tablet every 4-6 hours PRN severe pain 12/12/17   Johnny [name],  Johnny [name], Johnny [name]  oxyCODONE-acetaminophen (ROXICET) 5-325 MG tablet Take 1 tablet by mouth every 6 (six) hours as needed. 12/26/17 12/26/18  Johnny Polio, Johnny [name]  predniSONE (DELTASONE) 10 MG tablet Take 6 tabs PO x3 days, then take 4 tabs PO x3 days, then take 2 tabs PO x3 days, then take 1 tab PO x3 days, then take 1/2 tab PO x4 days. 12/12/17   Johnny [name], Johnny [name], Johnny [name]  tiZANidine (ZANAFLEX) 4 MG tablet Take 1 tablet (4 mg total) by mouth every 8 (eight) hours as needed for muscle spasms. 11/19/17   Johnny [name], Johnny [name], Johnny [name]  valACYclovir (VALTREX) 1000 MG tablet Take 1 tablet (1,000 mg total) by mouth 2 (two) times daily. 12/12/17   Johnny Roys, Johnny [name]    Family History  Problem Relation Age of Onset  . Cancer Father        sarcoma  . Cirrhosis Paternal Grandmother   . Cancer Paternal Grandfather        Pancreatic  .  Fibromyalgia Sister   . Deafness Sister      Social History   Tobacco Use  . Smoking status: Current Every Day Smoker    Packs/day: 0.50    Types: Cigarettes  . Smokeless tobacco: Never Used  Substance Use Topics  . Alcohol use: No  . Drug use: No    Allergies as of 12/31/2017 - Review Complete 12/26/2017  Allergen Reaction Noted  . Flexeril [cyclobenzaprine] Anaphylaxis 08/05/2012  . Codeine Itching 08/05/2012  . Flexeril [cyclobenzaprine] Swelling 12/26/2017  . Latex Swelling 08/05/2012  . Penicillins Nausea And Vomiting 07/13/2017  . Tramadol Other (See Comments) 08/05/2012  . Tramadol Other (See Comments) 12/26/2017  . Sulfamethoxazole-trimethoprim Nausea Only and Other (See Comments) 09/27/2015    Review of Systems:    All systems reviewed and negative except where noted in HPI.   Physical Exam:  There were no vitals taken for this visit. No LMP for male patient. Psych:  Alert and cooperative. Normal mood and affect. General:   Alert,  Well-developed, well-nourished, pleasant and cooperative in NAD Head:  Normocephalic and atraumatic. Eyes:  Sclera clear, no icterus.   Conjunctiva pink. Ears:  Normal auditory acuity. Nose:  No deformity, discharge, or lesions. Mouth:  No deformity or lesions,oropharynx pink & moist. Neck:  Supple; no masses or thyromegaly. Lungs:  Respirations even and unlabored.  Clear throughout to auscultation.   No wheezes, crackles, or rhonchi. No acute distress. Heart:  Regular rate and rhythm; no murmurs, clicks, rubs, or gallops. Abdomen:  Normal bowel sounds.  No bruits.  Soft, positive tenderness in the left abdomen in the left lower quadrant and non-distended without masses, hepatosplenomegaly or hernias noted.  No guarding or rebound tenderness.  Negative Carnett sign.   Rectal:  Deferred.  Msk:  Symmetrical without gross deformities.  Good, equal movement & strength bilaterally. Pulses:  Normal pulses noted. Extremities:  No clubbing  or edema.  No cyanosis. Neurologic:  Alert and oriented x3;  grossly normal neurologically. Skin:  Intact without significant lesions or rashes.  No jaundice. Lymph Nodes:  No significant cervical adenopathy. Psych:  Alert and cooperative. Normal mood and affect.  Imaging Studies: Dg Ribs Unilateral W/chest Left  Result Date: 12/11/2017 CLINICAL DATA:  Left chest pain, superior left anterior rib pain EXAM: LEFT RIBS AND CHEST - 3+ VIEW COMPARISON:  Chest radiographs dated 11/15/2017 FINDINGS: Lungs are clear.  No pleural effusion or pneumothorax. The heart is normal in size. No displaced left rib fracture  is seen. IMPRESSION: Normal chest radiographs. No displaced left rib fracture is seen. Electronically Signed   By: Julian Hy M.D.   On: 12/11/2017 12:09   Dg Cervical Spine Complete  Result Date: 12/12/2017 CLINICAL DATA:  Neck pain EXAM: CERVICAL SPINE - COMPLETE 4+ VIEW COMPARISON:  CT cervical spine 12/25/2012 FINDINGS: ACDF with solid fusion C5-6. Anterior osteophytes at C3-4 and C4-5 unchanged from the prior study. Mild disc degeneration C3-4 and C4-5. No significant foraminal encroachment. Negative for fracture. Prevertebral soft tissues normal IMPRESSION: Solid fusion at C5-6. Mild cervical spine degenerative change without significant foraminal stenosis. Electronically Signed   By: Franchot Gallo M.D.   On: 12/12/2017 13:29   Mr Thoracic Spine W Wo Contrast  Result Date: 12/11/2017 CLINICAL DATA:  Left-sided chest pain radiating to the back. EXAM: MRI THORACIC WITHOUT AND WITH CONTRAST TECHNIQUE: Multiplanar and multiecho pulse sequences of the thoracic spine were obtained without and with intravenous contrast. CONTRAST:  72mL MULTIHANCE GADOBENATE DIMEGLUMINE 529 MG/ML IV SOLN COMPARISON:  11/15/2017 FINDINGS: MRI THORACIC SPINE FINDINGS Alignment:  Very minimal curvature. Vertebrae: No fracture or primary bone lesion. Scattered old endplate Schmorl's nodes without edema or  enhancement. Cord:  No cord compression or primary cord lesion. Paraspinal and other soft tissues: No para spinous lesion or significant soft tissue finding. There is what looks like a small sebaceous cyst in the posterior subcutaneous fat to the left of midline behind C7. This should not be significant. Disc levels: No abnormality at T3-4 above. T4-5: Small left and disc bulge.  No stenosis. T5-6: Shallow left posterolateral disc protrusion without neural compression. This could contribute to back pain. T6-7:  Tiny central disc protrusion.  No neural compression. T7-8: Tiny central disc protrusion.  No neural compression. T8-9 and T9-10:  Normal. T10-11:  Minimal disc bulge.  No stenosis. T11-12: Normal. No abnormal enhancement.  No change since the previous exam. IMPRESSION: No change since the study of 1 month ago. No acute finding. Ordinary mild degenerative changes in the thoracic region. Shallow left posterolateral disc protrusion at T5-6 could possibly be symptomatic. Electronically Signed   By: Nelson Chimes M.D.   On: 12/11/2017 14:46   Mr Lumbar Spine W Wo Contrast  Result Date: 12/11/2017 CLINICAL DATA:  Left-sided chest pain radiating to the back. EXAM: MRI LUMBAR SPINE WITHOUT AND WITH CONTRAST TECHNIQUE: Multiplanar and multiecho pulse sequences of the lumbar spine were obtained without and with intravenous contrast. CONTRAST:  73mL MULTIHANCE GADOBENATE DIMEGLUMINE 529 MG/ML IV SOLN COMPARISON:  11/15/2017 FINDINGS: Segmentation:  5 lumbar type vertebral bodies. Alignment:  Normal Vertebrae:  Mild anterior corner  edema at T12-L1 and L3-4. Conus medullaris and cauda equina: Conus extends to the T12-L1 level. Conus and cauda equina appear normal. Paraspinal and other soft tissues: Again demonstrated is edema and enhancement of the paraspinous musculature. The nature of this is uncertain. It could be due to any sort of myositis. Small fluid collections within the muscles previously seen are no  longer present. Retroperitoneal structures appear normal. Disc levels: L1-2:  Normal. L2-3: Minimal disc bulge.  No stenosis. L3-4: Mild disc bulge. No canal stenosis. Mild bilateral foraminal narrowing without visible neural compression. Mild facet hypertrophy. L4-5: Disc degeneration with bulging of the disc. Mild bilateral foraminal narrowing without visible neural compression. Mild facet hypertrophy. L5-S1: Disc degeneration with loss of height. Circumferential bulging without canal or foraminal stenosis. Mild facet osteoarthritis. IMPRESSION: Re- demonstration of edema and enhancement of the posterior paraspinous musculature. There are no  longer any intramuscular fluid collections as were seen 1 month ago. The appearance is nonspecific and could be due to any sort of myositis. There are no new or progressive findings. 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 Electronically Signed   By: Nelson Chimes M.D.   On: 12/11/2017 14:56   Ct Abdomen Pelvis W Contrast  Result Date: 12/26/2017 CLINICAL DATA:  47 year old male with history of left lower quadrant abdominal pain since last Thursday, with recent history of nausea, vomiting and diarrhea. EXAM: CT ABDOMEN AND PELVIS WITH CONTRAST TECHNIQUE: Multidetector CT imaging of the abdomen and pelvis was performed using the standard protocol following bolus administration of intravenous contrast. CONTRAST:  183mL ISOVUE-300 IOPAMIDOL (ISOVUE-300) INJECTION 61% COMPARISON:  None. FINDINGS: Lower chest: Unremarkable. Hepatobiliary: No suspicious cystic or solid hepatic lesions. No intrahepatic biliary ductal dilatation. Common bile duct is mildly dilated measuring 10 mm in the porta hepatis. Gallbladder is normal in appearance. Pancreas: In the head of the pancreas in the periampullary region there is a hypovascular area measuring 19 x 14 x 13 mm (coronal image 43 of series 5 and axial image  28 of series 2), concerning for potential neoplasm. This is associated with common bile duct dilatation as well as diffuse pancreatic ductal dilatation which measures up to 7 mm in the head of the pancreas. No peripancreatic inflammatory changes. Spleen: Unremarkable. Adrenals/Urinary Tract: Subcentimeter low-attenuation lesion in the upper pole of the right kidney is too small to characterize, but is statistically likely a cyst. Left kidney and bilateral adrenal glands are normal in appearance. No hydroureteronephrosis. Urinary bladder is normal in appearance. Stomach/Bowel: Normal appearance of the stomach. Duodenum appears diffusely thickened, particularly in the second portion. No definite surrounding inflammatory changes are appreciated. No pathologic dilatation of other portions of small bowel or colon. Normal appendix. Vascular/Lymphatic: Aortic atherosclerosis, without evidence of aneurysm or dissection in the abdominal or pelvic vasculature. No lymphadenopathy noted in the abdomen or pelvis. Reproductive: Prostate gland seminal vesicles are unremarkable in appearance. Other: No significant volume of ascites.  No pneumoperitoneum. Musculoskeletal: There are no aggressive appearing lytic or blastic lesions noted in the visualized portions of the skeleton. IMPRESSION: 1. 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 Further evaluation with EGD, endoscopic ultrasound and potential biopsy is strongly recommended in the near future. 2. In addition, there is some mild thickening of the duodenal wall, which could suggest duodenitis, however, there are no overt surrounding inflammatory changes noted at this time. 3. Aortic atherosclerosis. 4. Normal appendix. These results were called by telephone at the time of  interpretation on 12/26/2017 at 12:36 pm to Dr. Conni Slipper, who verbally acknowledged these results. Aortic Atherosclerosis (ICD10-I70.0). Electronically Signed   By: Vinnie Langton M.D.   On: 12/26/2017 12:35    Assessment and Plan:   Tymar Polyak. is a 47 y.o. y/o male who was recently in the ER with abdominal pain. The patient has CT scan that showed him to have a lesion in the pancreas. The patient also has had left-sided abdominal pain with diarrhea with nausea and vomiting. The patient states he has seen blood with his diarrhea. The CT scan also showed some thickening of the duodenum suggestive of duodenitis. The patient will be set up for a endoscopic  ultrasound of the pancreatic lesion in addition to having an EGD and colonoscopy to look for the source of his diarrhea, nausea and vomiting and abdominal pain. The patient will also be started on Protonix 40 mg a day for possible peptic ulcer disease and has been told to avoid NSAIDs. The patient will also have his blood sent off for CA 19-9. He will have his pain medication refilled. The patient has been explained the plan and agrees with it.  Johnny Lame, Johnny [name]. Marval Regal   Note: This dictation was prepared with Dragon dictation along with smaller phrase technology. Any transcriptional errors that result from this process are unintentional.

## 2017-12-31 NOTE — Telephone Encounter (Signed)
Johnny [name] with [facility] on [address] P)[phone],f)[phone]. Patient was seen by DR Allen Norris & they have one prescription for Percocet. They think there should be three more called in 1)ZOPHRAN,2)REFLUX MED & 3)DRINK FOR PROCEDURE.

## 2017-12-31 NOTE — Telephone Encounter (Signed)
Contacted pharmacist and informed him the prescriptions are there. Pt may have showed up before these were called in. Lannette Donath confirmed receiving.

## 2017-12-31 NOTE — Progress Notes (Signed)
Gastroenterology Consultation  Referring Provider:     Valerie Roys, DO Primary Care Physician:  Valerie Roys, DO Primary Gastroenterologist:  Dr. Allen Norris     Reason for Consultation:     Nausea vomiting diarrhea        HPI:   Johnny [name]. is a 47 y.o. y/o male referred for consultation & management of nausea vomiting diarrhea by Dr. Wynetta Emery, Megan P, DO.  This patient comes in today after being in the ER for nausea vomiting and diarrhea on January 2 of 2019. The patient had previously been in the ER on 12/11/2017 for chest pain. The patient had reported that he was having crampy chest pain that went to the left side of his abdomen. He reports the pain to have been crampy. In the emergency room the patient had reported that he wasn't able to keep much food or drink down. He also had reported that after he ate or drank he would have to run to the bathroom. The patient reports that he has approximate 10 bowel movements a day and has seen some blood. The patient also reports that the diarrhea wakes him up from a sound sleep approximate 3 times a night. The patient has not lost any weight as reported by him but appears to run between 180-190 pounds. The patient is 89 today. He reports the abdominal pain starts in the left lower abdomen and radiates up to his left upper quadrant. He also reports that the diarrhea has been more smelly than previously. The patient had C. difficile checked which was negative and he had a GI panel which was also negative. The patient does report that he had a few years of drinking a sixpack a day but hasn't done that for some time.  Past Medical History:  Diagnosis Date  . Bilateral sciatica   . Carpal tunnel syndrome   . Chronic back pain   . Chronic neck pain   . Chronic pain syndrome   . Herniated disc   . Traumatic amputation of left thumb 2013    Past Surgical History:  Procedure Laterality Date  . AMPUTATION FINGER / THUMB Left 2013  .  HERNIA REPAIR    . HIP SURGERY    . JOINT REPLACEMENT Right    knee  . KNEE SURGERY    . neck fusion    . REPLACEMENT TOTAL KNEE Right   . SHOULDER SURGERY Right 2016 X 2  . SPINAL FUSION    . TONSILLECTOMY      Prior to Admission medications   Medication Sig Start Date End Date Taking? Authorizing Provider  acetaminophen (TYLENOL) 325 MG tablet Take 650 mg by mouth every 4 (four) hours as needed.     [provider]  Cholecalciferol (VITAMIN D3) 2000 units TABS Take 1 tablet by mouth daily. 11/20/17   Johnson, Megan P, DO  gabapentin (NEURONTIN) 600 MG tablet Take 1 tablet (600 mg total) by mouth 3 (three) times daily. 12/10/17   Johnson, Megan P, DO  ibuprofen (ADVIL,MOTRIN) 800 MG tablet Take 1 tablet (800 mg total) by mouth every 8 (eight) hours as needed. 09/17/17   Johnson, Megan P, DO  ondansetron (ZOFRAN ODT) 4 MG disintegrating tablet Take 1 tablet (4 mg total) by mouth every 8 (eight) hours as needed for nausea or vomiting. 12/26/17   Nena Polio, MD  oxyCODONE-acetaminophen (ROXICET) 5-325 MG tablet 1 tablet every 4-6 hours PRN severe pain 12/12/17   Wynetta Emery,  Megan P, DO  oxyCODONE-acetaminophen (ROXICET) 5-325 MG tablet Take 1 tablet by mouth every 6 (six) hours as needed. 12/26/17 12/26/18  Nena Polio, MD  predniSONE (DELTASONE) 10 MG tablet Take 6 tabs PO x3 days, then take 4 tabs PO x3 days, then take 2 tabs PO x3 days, then take 1 tab PO x3 days, then take 1/2 tab PO x4 days. 12/12/17   Johnson, Megan P, DO  tiZANidine (ZANAFLEX) 4 MG tablet Take 1 tablet (4 mg total) by mouth every 8 (eight) hours as needed for muscle spasms. 11/19/17   Johnson, Megan P, DO  valACYclovir (VALTREX) 1000 MG tablet Take 1 tablet (1,000 mg total) by mouth 2 (two) times daily. 12/12/17   Valerie Roys, DO    Family History  Problem Relation Age of Onset  . Cancer Father        sarcoma  . Cirrhosis Paternal Grandmother   . Cancer Paternal Grandfather        Pancreatic  .  Fibromyalgia Sister   . Deafness Sister      Social History   Tobacco Use  . Smoking status: Current Every Day Smoker    Packs/day: 0.50    Types: Cigarettes  . Smokeless tobacco: Never Used  Substance Use Topics  . Alcohol use: No  . Drug use: No    Allergies as of 12/31/2017 - Review Complete 12/26/2017  Allergen Reaction Noted  . Flexeril [cyclobenzaprine] Anaphylaxis 08/05/2012  . Codeine Itching 08/05/2012  . Flexeril [cyclobenzaprine] Swelling 12/26/2017  . Latex Swelling 08/05/2012  . Penicillins Nausea And Vomiting 07/13/2017  . Tramadol Other (See Comments) 08/05/2012  . Tramadol Other (See Comments) 12/26/2017  . Sulfamethoxazole-trimethoprim Nausea Only and Other (See Comments) 09/27/2015    Review of Systems:    All systems reviewed and negative except where noted in HPI.   Physical Exam:  There were no vitals taken for this visit. No LMP for male patient. Psych:  Alert and cooperative. Normal mood and affect. General:   Alert,  Well-developed, well-nourished, pleasant and cooperative in NAD Head:  Normocephalic and atraumatic. Eyes:  Sclera clear, no icterus.   Conjunctiva pink. Ears:  Normal auditory acuity. Nose:  No deformity, discharge, or lesions. Mouth:  No deformity or lesions,oropharynx pink & moist. Neck:  Supple; no masses or thyromegaly. Lungs:  Respirations even and unlabored.  Clear throughout to auscultation.   No wheezes, crackles, or rhonchi. No acute distress. Heart:  Regular rate and rhythm; no murmurs, clicks, rubs, or gallops. Abdomen:  Normal bowel sounds.  No bruits.  Soft, positive tenderness in the left abdomen in the left lower quadrant and non-distended without masses, hepatosplenomegaly or hernias noted.  No guarding or rebound tenderness.  Negative Carnett sign.   Rectal:  Deferred.  Msk:  Symmetrical without gross deformities.  Good, equal movement & strength bilaterally. Pulses:  Normal pulses noted. Extremities:  No clubbing  or edema.  No cyanosis. Neurologic:  Alert and oriented x3;  grossly normal neurologically. Skin:  Intact without significant lesions or rashes.  No jaundice. Lymph Nodes:  No significant cervical adenopathy. Psych:  Alert and cooperative. Normal mood and affect.  Imaging Studies: Dg Ribs Unilateral W/chest Left  Result Date: 12/11/2017 CLINICAL DATA:  Left chest pain, superior left anterior rib pain EXAM: LEFT RIBS AND CHEST - 3+ VIEW COMPARISON:  Chest radiographs dated 11/15/2017 FINDINGS: Lungs are clear.  No pleural effusion or pneumothorax. The heart is normal in size. No displaced left rib fracture  is seen. IMPRESSION: Normal chest radiographs. No displaced left rib fracture is seen. Electronically Signed   By: Julian Hy M.D.   On: 12/11/2017 12:09   Dg Cervical Spine Complete  Result Date: 12/12/2017 CLINICAL DATA:  Neck pain EXAM: CERVICAL SPINE - COMPLETE 4+ VIEW COMPARISON:  CT cervical spine 12/25/2012 FINDINGS: ACDF with solid fusion C5-6. Anterior osteophytes at C3-4 and C4-5 unchanged from the prior study. Mild disc degeneration C3-4 and C4-5. No significant foraminal encroachment. Negative for fracture. Prevertebral soft tissues normal IMPRESSION: Solid fusion at C5-6. Mild cervical spine degenerative change without significant foraminal stenosis. Electronically Signed   By: Franchot Gallo M.D.   On: 12/12/2017 13:29   Mr Thoracic Spine W Wo Contrast  Result Date: 12/11/2017 CLINICAL DATA:  Left-sided chest pain radiating to the back. EXAM: MRI THORACIC WITHOUT AND WITH CONTRAST TECHNIQUE: Multiplanar and multiecho pulse sequences of the thoracic spine were obtained without and with intravenous contrast. CONTRAST:  68mL MULTIHANCE GADOBENATE DIMEGLUMINE 529 MG/ML IV SOLN COMPARISON:  11/15/2017 FINDINGS: MRI THORACIC SPINE FINDINGS Alignment:  Very minimal curvature. Vertebrae: No fracture or primary bone lesion. Scattered old endplate Schmorl's nodes without edema or  enhancement. Cord:  No cord compression or primary cord lesion. Paraspinal and other soft tissues: No para spinous lesion or significant soft tissue finding. There is what looks like a small sebaceous cyst in the posterior subcutaneous fat to the left of midline behind C7. This should not be significant. Disc levels: No abnormality at T3-4 above. T4-5: Small left and disc bulge.  No stenosis. T5-6: Shallow left posterolateral disc protrusion without neural compression. This could contribute to back pain. T6-7:  Tiny central disc protrusion.  No neural compression. T7-8: Tiny central disc protrusion.  No neural compression. T8-9 and T9-10:  Normal. T10-11:  Minimal disc bulge.  No stenosis. T11-12: Normal. No abnormal enhancement.  No change since the previous exam. IMPRESSION: No change since the study of 1 month ago. No acute finding. Ordinary mild degenerative changes in the thoracic region. Shallow left posterolateral disc protrusion at T5-6 could possibly be symptomatic. Electronically Signed   By: Nelson Chimes M.D.   On: 12/11/2017 14:46   Mr Lumbar Spine W Wo Contrast  Result Date: 12/11/2017 CLINICAL DATA:  Left-sided chest pain radiating to the back. EXAM: MRI LUMBAR SPINE WITHOUT AND WITH CONTRAST TECHNIQUE: Multiplanar and multiecho pulse sequences of the lumbar spine were obtained without and with intravenous contrast. CONTRAST:  35mL MULTIHANCE GADOBENATE DIMEGLUMINE 529 MG/ML IV SOLN COMPARISON:  11/15/2017 FINDINGS: Segmentation:  5 lumbar type vertebral bodies. Alignment:  Normal Vertebrae:  Mild anterior corner  edema at T12-L1 and L3-4. Conus medullaris and cauda equina: Conus extends to the T12-L1 level. Conus and cauda equina appear normal. Paraspinal and other soft tissues: Again demonstrated is edema and enhancement of the paraspinous musculature. The nature of this is uncertain. It could be due to any sort of myositis. Small fluid collections within the muscles previously seen are no  longer present. Retroperitoneal structures appear normal. Disc levels: L1-2:  Normal. L2-3: Minimal disc bulge.  No stenosis. L3-4: Mild disc bulge. No canal stenosis. Mild bilateral foraminal narrowing without visible neural compression. Mild facet hypertrophy. L4-5: Disc degeneration with bulging of the disc. Mild bilateral foraminal narrowing without visible neural compression. Mild facet hypertrophy. L5-S1: Disc degeneration with loss of height. Circumferential bulging without canal or foraminal stenosis. Mild facet osteoarthritis. IMPRESSION: Re- demonstration of edema and enhancement of the posterior paraspinous musculature. There are no  longer any intramuscular fluid collections as were seen 1 month ago. The appearance is nonspecific and could be due to any sort of myositis. There are no new or progressive findings. 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 Electronically Signed   By: Nelson Chimes M.D.   On: 12/11/2017 14:56   Ct Abdomen Pelvis W Contrast  Result Date: 12/26/2017 CLINICAL DATA:  47 year old male with history of left lower quadrant abdominal pain since last Thursday, with recent history of nausea, vomiting and diarrhea. EXAM: CT ABDOMEN AND PELVIS WITH CONTRAST TECHNIQUE: Multidetector CT imaging of the abdomen and pelvis was performed using the standard protocol following bolus administration of intravenous contrast. CONTRAST:  120mL ISOVUE-300 IOPAMIDOL (ISOVUE-300) INJECTION 61% COMPARISON:  None. FINDINGS: Lower chest: Unremarkable. Hepatobiliary: No suspicious cystic or solid hepatic lesions. No intrahepatic biliary ductal dilatation. Common bile duct is mildly dilated measuring 10 mm in the porta hepatis. Gallbladder is normal in appearance. Pancreas: In the head of the pancreas in the periampullary region there is a hypovascular area measuring 19 x 14 x 13 mm (coronal image 43 of series 5 and axial image  28 of series 2), concerning for potential neoplasm. This is associated with common bile duct dilatation as well as diffuse pancreatic ductal dilatation which measures up to 7 mm in the head of the pancreas. No peripancreatic inflammatory changes. Spleen: Unremarkable. Adrenals/Urinary Tract: Subcentimeter low-attenuation lesion in the upper pole of the right kidney is too small to characterize, but is statistically likely a cyst. Left kidney and bilateral adrenal glands are normal in appearance. No hydroureteronephrosis. Urinary bladder is normal in appearance. Stomach/Bowel: Normal appearance of the stomach. Duodenum appears diffusely thickened, particularly in the second portion. No definite surrounding inflammatory changes are appreciated. No pathologic dilatation of other portions of small bowel or colon. Normal appendix. Vascular/Lymphatic: Aortic atherosclerosis, without evidence of aneurysm or dissection in the abdominal or pelvic vasculature. No lymphadenopathy noted in the abdomen or pelvis. Reproductive: Prostate gland seminal vesicles are unremarkable in appearance. Other: No significant volume of ascites.  No pneumoperitoneum. Musculoskeletal: There are no aggressive appearing lytic or blastic lesions noted in the visualized portions of the skeleton. IMPRESSION: 1. 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 Further evaluation with EGD, endoscopic ultrasound and potential biopsy is strongly recommended in the near future. 2. In addition, there is some mild thickening of the duodenal wall, which could suggest duodenitis, however, there are no overt surrounding inflammatory changes noted at this time. 3. Aortic atherosclerosis. 4. Normal appendix. These results were called by telephone at the time of  interpretation on 12/26/2017 at 12:36 pm to Dr. Conni Slipper, who verbally acknowledged these results. Aortic Atherosclerosis (ICD10-I70.0). Electronically Signed   By: Vinnie Langton M.D.   On: 12/26/2017 12:35    Assessment and Plan:   Johnny [name]. is a 47 y.o. y/o male who was recently in the ER with abdominal pain. The patient has CT scan that showed him to have a lesion in the pancreas. The patient also has had left-sided abdominal pain with diarrhea with nausea and vomiting. The patient states he has seen blood with his diarrhea. The CT scan also showed some thickening of the duodenum suggestive of duodenitis. The patient will be set up for a endoscopic  ultrasound of the pancreatic lesion in addition to having an EGD and colonoscopy to look for the source of his diarrhea, nausea and vomiting and abdominal pain. The patient will also be started on Protonix 40 mg a day for possible peptic ulcer disease and has been told to avoid NSAIDs. The patient will also have his blood sent off for CA 19-9. He will have his pain medication refilled. The patient has been explained the plan and agrees with it.  [name], MD. Marval Regal   Note: This dictation was prepared with Dragon dictation along with smaller phrase technology. Any transcriptional errors that result from this process are unintentional.

## 2018-01-01 ENCOUNTER — Other Ambulatory Visit: Payer: Self-pay

## 2018-01-01 ENCOUNTER — Encounter: Payer: Self-pay | Admitting: *Deleted

## 2018-01-01 DIAGNOSIS — K8689 Other specified diseases of pancreas: Secondary | ICD-10-CM

## 2018-01-01 LAB — CANCER ANTIGEN 19-9: CA 19-9: 1 U/mL (ref 0–35)

## 2018-01-02 ENCOUNTER — Telehealth: Payer: Self-pay

## 2018-01-02 NOTE — Discharge Instructions (Signed)
General Anesthesia, Adult, Care After °These instructions provide you with information about caring for yourself after your procedure. Your health care provider may also give you more specific instructions. Your treatment has been planned according to current medical practices, but problems sometimes occur. Call your health care provider if you have any problems or questions after your procedure. °What can I expect after the procedure? °After the procedure, it is common to have: °· Vomiting. °· A sore throat. °· Mental slowness. ° °It is common to feel: °· Nauseous. °· Cold or shivery. °· Sleepy. °· Tired. °· Sore or achy, even in parts of your body where you did not have surgery. ° °Follow these instructions at home: °For at least 24 hours after the procedure: °· Do not: °? Participate in activities where you could fall or become injured. °? Drive. °? Use heavy machinery. °? Drink alcohol. °? Take sleeping pills or medicines that cause drowsiness. °? Make important decisions or sign legal documents. °? Take care of children on your own. °· Rest. °Eating and drinking °· If you vomit, drink water, juice, or soup when you can drink without vomiting. °· Drink enough fluid to keep your urine clear or pale yellow. °· Make sure you have little or no nausea before eating solid foods. °· Follow the diet recommended by your health care provider. °General instructions °· Have a responsible adult stay with you until you are awake and alert. °· Return to your normal activities as told by your health care provider. Ask your health care provider what activities are safe for you. °· Take over-the-counter and prescription medicines only as told by your health care provider. °· If you smoke, do not smoke without supervision. °· Keep all follow-up visits as told by your health care provider. This is important. °Contact a health care provider if: °· You continue to have nausea or vomiting at home, and medicines are not helpful. °· You  cannot drink fluids or start eating again. °· You cannot urinate after 8-12 hours. °· You develop a skin rash. °· You have fever. °· You have increasing redness at the site of your procedure. °Get help right away if: °· You have difficulty breathing. °· You have chest pain. °· You have unexpected bleeding. °· You feel that you are having a life-threatening or urgent problem. °This information is not intended to replace advice given to you by your health care provider. Make sure you discuss any questions you have with your health care provider. °Document Released: 03/19/2001 Document Revised: 05/15/2016 Document Reviewed: 11/25/2015 °Elsevier Interactive Patient Education © 2018 [employer] ° °

## 2018-01-02 NOTE — Telephone Encounter (Signed)
LMOM for patient to call back for lab results//kdc 

## 2018-01-02 NOTE — Telephone Encounter (Signed)
Pt returned my called and lab results were given.

## 2018-01-02 NOTE — Telephone Encounter (Signed)
-----   Message from [name], MD sent at 01/01/2018  8:38 PM EST ----- That the patient know that his tumor marker for his pancreas was negative but the final diagnosis will be made by the biopsy.

## 2018-01-03 ENCOUNTER — Ambulatory Visit
Admission: RE | Admit: 2018-01-03 | Discharge: 2018-01-03 | Disposition: A | Discharge status: Home / Self Care | Payer: Medicaid Other | Source: Ambulatory Visit | Attending: Gastroenterology | Admitting: Gastroenterology

## 2018-01-03 ENCOUNTER — Telehealth: Payer: Self-pay | Admitting: Family Medicine

## 2018-01-03 ENCOUNTER — Ambulatory Visit: Payer: Medicaid Other | Admitting: Anesthesiology

## 2018-01-03 ENCOUNTER — Telehealth: Payer: Self-pay

## 2018-01-03 ENCOUNTER — Encounter
Admission: RE | Disposition: A | Discharge status: Home / Self Care | Payer: Self-pay | Source: Ambulatory Visit | Attending: Gastroenterology

## 2018-01-03 ENCOUNTER — Ambulatory Visit: Payer: Self-pay

## 2018-01-03 DIAGNOSIS — Z888 Allergy status to other drugs, medicaments and biological substances status: Secondary | ICD-10-CM | POA: Diagnosis not present

## 2018-01-03 DIAGNOSIS — R112 Nausea with vomiting, unspecified: Secondary | ICD-10-CM | POA: Diagnosis not present

## 2018-01-03 DIAGNOSIS — K2100 Gastro-esophageal reflux disease with esophagitis, without bleeding: Secondary | ICD-10-CM

## 2018-01-03 DIAGNOSIS — K635 Polyp of colon: Secondary | ICD-10-CM | POA: Diagnosis not present

## 2018-01-03 DIAGNOSIS — D124 Benign neoplasm of descending colon: Secondary | ICD-10-CM | POA: Diagnosis not present

## 2018-01-03 DIAGNOSIS — G894 Chronic pain syndrome: Secondary | ICD-10-CM | POA: Insufficient documentation

## 2018-01-03 DIAGNOSIS — R197 Diarrhea, unspecified: Secondary | ICD-10-CM

## 2018-01-03 DIAGNOSIS — D122 Benign neoplasm of ascending colon: Secondary | ICD-10-CM

## 2018-01-03 DIAGNOSIS — Z882 Allergy status to sulfonamides status: Secondary | ICD-10-CM | POA: Diagnosis not present

## 2018-01-03 DIAGNOSIS — R1084 Generalized abdominal pain: Secondary | ICD-10-CM

## 2018-01-03 DIAGNOSIS — Z880 Allergy status to penicillin: Secondary | ICD-10-CM | POA: Insufficient documentation

## 2018-01-03 DIAGNOSIS — Z79899 Other long term (current) drug therapy: Secondary | ICD-10-CM | POA: Diagnosis not present

## 2018-01-03 DIAGNOSIS — K269 Duodenal ulcer, unspecified as acute or chronic, without hemorrhage or perforation: Secondary | ICD-10-CM

## 2018-01-03 DIAGNOSIS — F17210 Nicotine dependence, cigarettes, uncomplicated: Secondary | ICD-10-CM | POA: Diagnosis not present

## 2018-01-03 DIAGNOSIS — Z885 Allergy status to narcotic agent status: Secondary | ICD-10-CM | POA: Insufficient documentation

## 2018-01-03 HISTORY — DX: Presence of dental prosthetic device (complete) (partial): Z97.2

## 2018-01-03 HISTORY — PX: ESOPHAGOGASTRODUODENOSCOPY (EGD) WITH PROPOFOL: SHX5813

## 2018-01-03 HISTORY — PX: COLONOSCOPY WITH PROPOFOL: SHX5780

## 2018-01-03 HISTORY — DX: Gastro-esophageal reflux disease without esophagitis: K21.9

## 2018-01-03 HISTORY — DX: Unspecified osteoarthritis, unspecified site: M19.90

## 2018-01-03 HISTORY — PX: POLYPECTOMY: SHX5525

## 2018-01-03 SURGERY — COLONOSCOPY WITH PROPOFOL
Anesthesia: General | Site: Rectum | Wound class: Dirty or Infected

## 2018-01-03 MED ORDER — LIDOCAINE HCL (CARDIAC) 20 MG/ML IV SOLN
INTRAVENOUS | Status: DC | PRN
  Administered 2018-01-03: 50 mg via INTRAVENOUS

## 2018-01-03 MED ORDER — LACTATED RINGERS IV SOLN
10.0000 mL/h | INTRAVENOUS | Status: DC
  Administered 2018-01-03: 09:00:00 via INTRAVENOUS

## 2018-01-03 MED ORDER — PROPOFOL 10 MG/ML IV BOLUS
INTRAVENOUS | Status: DC | PRN
  Administered 2018-01-03: 30 mg via INTRAVENOUS
  Administered 2018-01-03: 150 mg via INTRAVENOUS
  Administered 2018-01-03: 30 mg via INTRAVENOUS
  Administered 2018-01-03: 40 mg via INTRAVENOUS
  Administered 2018-01-03: 20 mg via INTRAVENOUS
  Administered 2018-01-03 (×3): 30 mg via INTRAVENOUS
  Administered 2018-01-03: 50 mg via INTRAVENOUS
  Administered 2018-01-03: 40 mg via INTRAVENOUS

## 2018-01-03 MED ORDER — FENTANYL CITRATE (PF) 100 MCG/2ML IJ SOLN: 25.0000 ug | INTRAMUSCULAR | Status: DC | PRN

## 2018-01-03 MED ORDER — OXYCODONE HCL 5 MG/5ML PO SOLN: 5.0000 mg | Freq: Once | ORAL | Status: AC | PRN

## 2018-01-03 MED ORDER — OXYCODONE HCL 5 MG PO TABS
5.0000 mg | ORAL_TABLET | Freq: Once | ORAL | Status: AC | PRN
  Administered 2018-01-03: 5 mg via ORAL

## 2018-01-03 MED ORDER — GLYCOPYRROLATE 0.2 MG/ML IJ SOLN
INTRAMUSCULAR | Status: DC | PRN
  Administered 2018-01-03: 0.1 mg via INTRAVENOUS

## 2018-01-03 MED ORDER — PROMETHAZINE HCL 25 MG/ML IJ SOLN: 6.2500 mg | INTRAMUSCULAR | Status: DC | PRN

## 2018-01-03 MED ORDER — SODIUM CHLORIDE 0.9 % IV SOLN: INTRAVENOUS | Status: DC

## 2018-01-03 MED ORDER — MEPERIDINE HCL 25 MG/ML IJ SOLN: 6.2500 mg | INTRAMUSCULAR | Status: DC | PRN

## 2018-01-03 MED ORDER — SIMETHICONE 40 MG/0.6ML PO SUSP
ORAL | Status: DC | PRN
  Administered 2018-01-03: 10:00:00

## 2018-01-03 MED ORDER — OXYCODONE-ACETAMINOPHEN 5-325 MG PO TABS: 1.0000 | ORAL_TABLET | ORAL | 0 refills | Status: DC | PRN

## 2018-01-03 SURGICAL SUPPLY — 36 items
BALLN DILATOR 10-12 8 (BALLOONS)
BALLN DILATOR 12-15 8 (BALLOONS)
BALLN DILATOR 15-18 8 (BALLOONS)
BALLN DILATOR CRE 0-12 8 (BALLOONS)
BALLN DILATOR ESOPH 8 10 CRE (MISCELLANEOUS) IMPLANT
BALLOON DILATOR 12-15 8 (BALLOONS) IMPLANT
BALLOON DILATOR 15-18 8 (BALLOONS) IMPLANT
BALLOON DILATOR CRE 0-12 8 (BALLOONS) IMPLANT
BLOCK BITE 60FR ADLT L/F GRN (MISCELLANEOUS) ×3 IMPLANT
CANISTER SUCT 1200ML W/VALVE (MISCELLANEOUS) ×3 IMPLANT
CLIP HMST 235XBRD CATH ROT (MISCELLANEOUS) IMPLANT
CLIP RESOLUTION 360 11X235 (MISCELLANEOUS)
ELECT REM PT RETURN 9FT ADLT (ELECTROSURGICAL)
ELECTRODE REM PT RTRN 9FT ADLT (ELECTROSURGICAL) IMPLANT
FCP ESCP3.2XJMB 240X2.8X (MISCELLANEOUS)
FORCEPS BIOP RAD 4 LRG CAP 4 (CUTTING FORCEPS) ×1 IMPLANT
FORCEPS BIOP RJ4 240 W/NDL (MISCELLANEOUS)
FORCEPS ESCP3.2XJMB 240X2.8X (MISCELLANEOUS) IMPLANT
GOWN CVR UNV OPN BCK APRN NK (MISCELLANEOUS) ×4 IMPLANT
GOWN ISOL THUMB LOOP REG UNIV (MISCELLANEOUS) ×6
INJECTOR VARIJECT VIN23 (MISCELLANEOUS) IMPLANT
KIT DEFENDO VALVE AND CONN (KITS) IMPLANT
KIT ENDO PROCEDURE OLY (KITS) ×3 IMPLANT
MARKER SPOT ENDO TATTOO 5ML (MISCELLANEOUS) IMPLANT
PROBE APC STR FIRE (PROBE) IMPLANT
RETRIEVER NET PLAT FOOD (MISCELLANEOUS) IMPLANT
RETRIEVER NET ROTH 2.5X230 LF (MISCELLANEOUS) IMPLANT
SNARE SHORT THROW 13M SML OVAL (MISCELLANEOUS) ×1 IMPLANT
SNARE SHORT THROW 30M LRG OVAL (MISCELLANEOUS) IMPLANT
SNARE SNG USE RND 15MM (INSTRUMENTS) IMPLANT
SPOT EX ENDOSCOPIC TATTOO (MISCELLANEOUS)
SYR INFLATION 60ML (SYRINGE) IMPLANT
TRAP ETRAP POLY (MISCELLANEOUS) ×1 IMPLANT
VARIJECT INJECTOR VIN23 (MISCELLANEOUS)
WATER STERILE IRR 250ML POUR (IV SOLUTION) ×3 IMPLANT
WIRE CRE 18-20MM 8CM F G (MISCELLANEOUS) IMPLANT

## 2018-01-03 NOTE — Telephone Encounter (Signed)
Call from GI regarding his behavior after his colonoscopy- complained of a lot of pain. Given 20 pills of pain medicine on Monday, gone today. An additional 10 pills given today. No colitis on exam, significant stool. Some polyps. Likely needs colonoscopy next year.

## 2018-01-03 NOTE — Transfer of Care (Signed)
Immediate Anesthesia Transfer of Care Note  Patient: Johnny [name].  Procedure(s) Performed: COLONOSCOPY WITH PROPOFOL (N/A Rectum) ESOPHAGOGASTRODUODENOSCOPY (EGD) WITH PROPOFOL (N/A Esophagus) POLYPECTOMY (N/A Rectum)  Patient Location: PACU  Anesthesia Type: General  Level of Consciousness: awake, alert  and patient cooperative  Airway and Oxygen Therapy: Patient Spontanous Breathing and Patient connected to supplemental oxygen  Post-op Assessment: Post-op Vital signs reviewed, Patient's Cardiovascular Status Stable, Respiratory Function Stable, Patent Airway and No signs of Nausea or vomiting  Post-op Vital Signs: Reviewed and stable  Complications: No apparent anesthesia complications

## 2018-01-03 NOTE — Telephone Encounter (Signed)
He will need an appointment to discuss any changes.

## 2018-01-03 NOTE — Op Note (Addendum)
[facility] Gastroenterology Patient Name: Johnny [name] Procedure Date: 01/03/2018 9:18 AM MRN: [id number] Account #: [id number] Date of Birth: [birth year] Admit Type: Outpatient Age: 47 Room: [facility] OR ROOM 01 Gender: Male Note Status: Supervisor Override Procedure:            Upper GI endoscopy Indications:          Nausea with vomiting Providers:            [name] MD, MD Referring MD:         Valerie Roys (Referring MD) Medicines:            Propofol per Anesthesia Complications:        No immediate complications. Procedure:            Pre-Anesthesia Assessment:                       - Prior to the procedure, a History and Physical was                        performed, and patient medications and allergies were                        reviewed. The patient's tolerance of previous                        anesthesia was also reviewed. The risks and benefits of                        the procedure and the sedation options and risks were                        discussed with the patient. All questions were                        answered, and informed consent was obtained. Prior                        Anticoagulants: The patient has taken no previous                        anticoagulant or antiplatelet agents. ASA Grade                        Assessment: II - A patient with mild systemic disease.                        After reviewing the risks and benefits, the patient was                        deemed in satisfactory condition to undergo the                        procedure.                       After obtaining informed consent, the endoscope was                        passed under direct vision. Throughout the procedure,  the patient's blood pressure, pulse, and oxygen                        saturations were monitored continuously. The Olympus                        GIF H180J Endoscope (S#: [id number]) was introduced    through the mouth, and advanced to the second part of                        duodenum. The upper GI endoscopy was accomplished                        without difficulty. The patient tolerated the procedure                        well. Findings:      LA Grade A (one or more mucosal breaks less than 5 mm, not extending       between tops of 2 mucosal folds) esophagitis with no bleeding was found       at the gastroesophageal junction.      The entire examined stomach was normal. Biopsies were taken with a cold       forceps for Helicobacter pylori testing.      Two non-bleeding cratered duodenal ulcers with no stigmata of bleeding       were found in the duodenal bulb. Impression:           - LA Grade A reflux esophagitis.                       - Normal stomach. Biopsied.                       - Multiple non-bleeding duodenal ulcers with no                        stigmata of bleeding. Recommendation:       - Discharge patient to home.                       - Resume previous diet.                       - Continue present medications.                       - Await pathology results.                       - Use a proton pump inhibitor PO daily. Procedure Code(s):    --- Professional ---                       [phone], Esophagogastroduodenoscopy, flexible, transoral;                        with biopsy, single or multiple Diagnosis Code(s):    --- Professional ---                       R11.2, Nausea with vomiting, unspecified  K21.0, Gastro-esophageal reflux disease with esophagitis                       K26.9, Duodenal ulcer, unspecified as acute or chronic,                        without hemorrhage or perforation CPT copyright 2018 American Medical Association. All rights reserved. The codes documented in this report are preliminary and upon coder review may  be revised to meet current compliance requirements. [name] MD, MD 01/03/2018 9:38:16 AM This report has  been signed electronically. Number of Addenda: 0 Note Initiated On: 01/03/2018 9:18 AM      [facility]

## 2018-01-03 NOTE — Telephone Encounter (Signed)
Voicemail left with Mr. Spelman to return call for EUS scheduling. Oncology Nurse Navigator Documentation  Navigator Location: [facility] (01/03/18 0900)   )Navigator Encounter Type: Telephone (01/03/18 0900) Telephone: Lahoma Crocker Call (01/03/18 0900)                       Barriers/Navigation Needs: Coordination of Care (01/03/18 0900)   Interventions: Coordination of Care (01/03/18 0900)   Coordination of Care: EUS (01/03/18 0900)                  Time Spent with Patient: 15 (01/03/18 0900)

## 2018-01-03 NOTE — Telephone Encounter (Signed)
Spoke with patient, he would like to know what he can do for pain since he can not take ibuprofen any longer.  He would also like to know if he cant have anything else for pain can his gabapentin be increased to 800 mg and sent in.

## 2018-01-03 NOTE — Anesthesia Preprocedure Evaluation (Signed)
Anesthesia Evaluation  Patient identified by MRN, date of birth, ID band Patient awake    Reviewed: Allergy & Precautions, H&P , NPO status , Patient's Chart, lab work & pertinent test results, reviewed documented beta blocker date and time   Airway Mallampati: II  TM Distance: >3 FB Neck ROM: full    Dental  (+) Upper Dentures, Lower Dentures   Pulmonary Current Smoker,    Pulmonary exam normal breath sounds clear to auscultation       Cardiovascular Exercise Tolerance: Good negative cardio ROS   Rhythm:regular Rate:Normal     Neuro/Psych negative neurological ROS  negative psych ROS   GI/Hepatic Neg liver ROS, GERD  ,  Endo/Other  negative endocrine ROS  Renal/GU negative Renal ROS  negative genitourinary   Musculoskeletal   Abdominal   Peds  Hematology negative hematology ROS (+)   Anesthesia Other Findings Had sips of sprite at 0745  Reproductive/Obstetrics negative OB ROS                             Anesthesia Physical Anesthesia Plan  ASA: II  Anesthesia Plan: General   Post-op Pain Management:    Induction:   PONV Risk Score and Plan:   Airway Management Planned:   Additional Equipment:   Intra-op Plan:   Post-operative Plan:   Informed Consent: I have reviewed the patients History and Physical, chart, labs and discussed the procedure including the risks, benefits and alternatives for the proposed anesthesia with the patient or authorized representative who has indicated his/her understanding and acceptance.   Dental Advisory Given  Plan Discussed with: CRNA  Anesthesia Plan Comments:         Anesthesia Quick Evaluation
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
## 2018-01-03 NOTE — Telephone Encounter (Signed)
Patient notified

## 2018-01-03 NOTE — Op Note (Signed)
[facility] Patient Name: Johnny [name] Procedure Date: 01/03/2018 9:26 AM MRN: [id number] Account #: [id number] Date of Birth: [birth year] Admit Type: Outpatient Age: 47 Room: [facility] OR ROOM 01 Gender: Male Note Status: Finalized Procedure:            Colonoscopy Indications:          Generalized abdominal pain Providers:            [name] MD, MD Referring MD:         Valerie Roys (Referring MD) Medicines:            Propofol per Anesthesia Complications:        No immediate complications. Procedure:            Pre-Anesthesia Assessment:                       - Prior to the procedure, a History and Physical was                        performed, and patient medications and allergies were                        reviewed. The patient's tolerance of previous                        anesthesia was also reviewed. The risks and benefits of                        the procedure and the sedation options and risks were                        discussed with the patient. All questions were                        answered, and informed consent was obtained. Prior                        Anticoagulants: The patient has taken no previous                        anticoagulant or antiplatelet agents. ASA Grade                        Assessment: II - A patient with mild systemic disease.                        After reviewing the risks and benefits, the patient was                        deemed in satisfactory condition to undergo the                        procedure.                       After obtaining informed consent, the colonoscope was                        passed under direct vision. Throughout the procedure,  the patient's blood pressure, pulse, and oxygen                        saturations were monitored continuously. The [place] [id number]) was introduced through the   anus and advanced to the the cecum, identified by                        appendiceal orifice and ileocecal valve. The                        colonoscopy was performed without difficulty. The                        patient tolerated the procedure well. The quality of                        the bowel preparation was poor. Findings:      The perianal and digital rectal examinations were normal.      A 3 mm polyp was found in the descending colon. The polyp was sessile.       The polyp was removed with a cold snare. Resection and retrieval were       complete.      A 7 mm polyp was found in the ascending colon. The polyp was sessile.       The polyp was removed with a cold snare. Resection and retrieval were       complete.      A moderate amount of stool was found in the entire colon, precluding       visualization.      Biopsies were taken with a cold forceps in the entire colon for       histology. Impression:           - Preparation of the colon was poor.                       - One 3 mm polyp in the descending colon, removed with                        a cold snare. Resected and retrieved.                       - One 7 mm polyp in the ascending colon, removed with a                        cold snare. Resected and retrieved.                       - Stool in the entire examined colon.                       - Biopsies were taken with a cold forceps for histology                        in the entire colon. Recommendation:       - Discharge patient to home.                       -  Resume previous diet.                       - Continue present medications.                       - Await pathology results.                       - Repeat colonoscopy in 1 year because the bowel                        preparation was poor. Procedure Code(s):    --- Professional ---                       [id number], Colonoscopy, flexible; with removal of tumor(s),                        polyp(s), or other lesion(s)  by snare technique                       45380, 22, Colonoscopy, flexible; with biopsy, single                        or multiple Diagnosis Code(s):    --- Professional ---                       R10.84, Generalized abdominal pain                       D12.4, Benign neoplasm of descending colon                       D12.2, Benign neoplasm of ascending colon CPT copyright 2016 American Medical Association. All rights reserved. The codes documented in this report are preliminary and upon coder review may  be revised to meet current compliance requirements. [name] MD, MD 01/03/2018 9:58:02 AM This report has been signed electronically. Number of Addenda: 0 Note Initiated On: 01/03/2018 9:26 AM Scope Withdrawal Time: 0 hours 6 minutes 59 seconds  Total Procedure Duration: 0 hours 14 minutes 31 seconds       [facility]

## 2018-01-03 NOTE — Telephone Encounter (Signed)
He was given 10 pills today by his GI doctor. No.

## 2018-01-03 NOTE — Anesthesia Procedure Notes (Signed)
Date/Time: 01/03/2018 9:26 AM Performed by: Cameron Ali, CRNA Pre-anesthesia Checklist: Patient identified, Emergency Drugs available, Suction available, Timeout performed and Patient being monitored Patient Re-evaluated:Patient Re-evaluated prior to induction Oxygen Delivery Method: Nasal cannula Placement Confirmation: positive ETCO2

## 2018-01-03 NOTE — Interval H&P Note (Signed)
History and Physical Interval Note:  01/03/2018 9:21 AM  Johnny [name].  has presented today for surgery, with the diagnosis of Diarrhea R19.7 Nausea and vomiting R11.2  The various methods of treatment have been discussed with the patient and family. After consideration of risks, benefits and other options for treatment, the patient has consented to  Procedure(s): COLONOSCOPY WITH PROPOFOL (N/A) ESOPHAGOGASTRODUODENOSCOPY (EGD) WITH PROPOFOL (N/A) as a surgical intervention .  The patient's history has been reviewed, patient examined, no change in status, stable for surgery.  I have reviewed the patient's chart and labs.  Questions were answered to the patient's satisfaction.     Kharizma Lesnick [employer]

## 2018-01-03 NOTE — Telephone Encounter (Signed)
Routing to provider  

## 2018-01-03 NOTE — Anesthesia Postprocedure Evaluation (Signed)
Anesthesia Post Note  Patient: Johnny [name].  Procedure(s) Performed: COLONOSCOPY WITH PROPOFOL (N/A Rectum) ESOPHAGOGASTRODUODENOSCOPY (EGD) WITH PROPOFOL (N/A Esophagus) POLYPECTOMY (N/A Rectum)  Patient location during evaluation: PACU Anesthesia Type: General Level of consciousness: awake and alert Pain management: pain level controlled Vital Signs Assessment: post-procedure vital signs reviewed and stable Respiratory status: spontaneous breathing, nonlabored ventilation, respiratory function stable and patient connected to nasal cannula oxygen Cardiovascular status: blood pressure returned to baseline and stable Postop Assessment: no apparent nausea or vomiting Anesthetic complications: no    Velia Pamer ELAINE

## 2018-01-04 ENCOUNTER — Telehealth: Payer: Self-pay

## 2018-01-04 ENCOUNTER — Encounter: Payer: Self-pay | Admitting: Gastroenterology

## 2018-01-04 NOTE — Telephone Encounter (Signed)
Second voicemail left with Mr. Hocker to return call for EUS scheduling. Oncology Nurse Navigator Documentation  Navigator Location: [facility] (01/04/18 0900)   )Navigator Encounter Type: Telephone (01/04/18 0900) Telephone: Lahoma Crocker Call (01/04/18 0900)                       Barriers/Navigation Needs: Coordination of Care (01/04/18 0900)   Interventions: Coordination of Care (01/04/18 0900)   Coordination of Care: EUS (01/04/18 0900)                  Time Spent with Patient: 15 (01/04/18 0900)

## 2018-01-07 ENCOUNTER — Other Ambulatory Visit: Payer: Self-pay

## 2018-01-07 ENCOUNTER — Telehealth: Payer: Self-pay

## 2018-01-07 ENCOUNTER — Encounter: Payer: Self-pay | Admitting: Gastroenterology

## 2018-01-07 NOTE — Telephone Encounter (Signed)
Third call placed to Mr. Johnny [name] to schedule EUS. EUS scheduled for 01/17/18 with Dr. Francella Solian at [place]. Went over instructions and copy mailed to home address along with contact information for any future questions. Denies diabetes or anticoagulant use. Mr. Johnny [name] offered sooner procedure at another facility but he does not want to go elsewhere.  INSTRUCTIONS FOR ENDOSCOPIC ULTRASOUND -Your procedure has been scheduled for January 24th with Dr. Francella Solian at [facility]. -The hospital may contact you to pre-register over the phone.  -To get your scheduled arrival time, please call the Endoscopy unit at  [phone] between 1-3 p.m. on:  January 23rd   -ON THE DAY OF YOU PROCEDURE:   1. If you are scheduled for a morning procedure, nothing to drink after midnight  -If you are scheduled for an afternoon procedure, you may have clear liquids until 5 hours prior  to the procedure but no carbonated drinks or broth  2. NO FOOD THE DAY OF YOUR PROCEDURE  3. You may take your heart, seizure, blood pressure, Parkinson's or breathing medications at  6am with just enough water to get your pills down  4. Do not take any oral Diabetic medications the morning of your procedure.  5. If you are a diabetic and are using insulin, please notify your prescribing physician of this  procedure as your dose may need to be altered related to not being able to eat or drink.   5. Do not take vitamins, iron, or fish oil for 5 days before your procedure     -On the day of your procedure, come to the [facility] Admitting/Registration desk (First desk on the right) at the scheduled arrival time. You MUST have someone drive you home from your procedure. You must have a responsible adult with a valid driver's license who is on site throughout your entire procedure and who can stay with you for several hours after your procedure. You may not go home alone in a taxi, shuttle [place] or bus, as the drivers will  not be responsible for you.  --If you have any questions please call me at the above contact Oncology Nurse Navigator Documentation  Navigator Location: [facility] (01/07/18 1300)   )Navigator Encounter Type: Telephone (01/07/18 1300) Telephone: [place] Call (01/07/18 1300)                       Barriers/Navigation Needs: Coordination of Care (01/07/18 1300)   Interventions: Coordination of Care (01/07/18 1300)   Coordination of Care: EUS (01/07/18 1300)                  Time Spent with Patient: 30 (01/07/18 1300)

## 2018-01-14 ENCOUNTER — Ambulatory Visit (INDEPENDENT_AMBULATORY_CARE_PROVIDER_SITE_OTHER): Payer: Medicaid Other | Admitting: Family Medicine

## 2018-01-14 ENCOUNTER — Encounter: Payer: Self-pay | Admitting: Family Medicine

## 2018-01-14 ENCOUNTER — Telehealth: Payer: Self-pay

## 2018-01-14 VITALS — BP 160/103 | HR 73 | Temp 98.1°F | Wt 181.4 lb

## 2018-01-14 DIAGNOSIS — M542 Cervicalgia: Secondary | ICD-10-CM | POA: Diagnosis not present

## 2018-01-14 DIAGNOSIS — G8929 Other chronic pain: Secondary | ICD-10-CM | POA: Insufficient documentation

## 2018-01-14 DIAGNOSIS — Z720 Tobacco use: Secondary | ICD-10-CM | POA: Diagnosis not present

## 2018-01-14 DIAGNOSIS — G8911 Acute pain due to trauma: Secondary | ICD-10-CM | POA: Diagnosis not present

## 2018-01-14 DIAGNOSIS — G5692 Unspecified mononeuropathy of left upper limb: Secondary | ICD-10-CM | POA: Diagnosis not present

## 2018-01-14 DIAGNOSIS — M5431 Sciatica, right side: Secondary | ICD-10-CM | POA: Diagnosis not present

## 2018-01-14 DIAGNOSIS — M25512 Pain in left shoulder: Secondary | ICD-10-CM

## 2018-01-14 DIAGNOSIS — G894 Chronic pain syndrome: Secondary | ICD-10-CM

## 2018-01-14 DIAGNOSIS — M5432 Sciatica, left side: Secondary | ICD-10-CM | POA: Diagnosis not present

## 2018-01-14 DIAGNOSIS — F17210 Nicotine dependence, cigarettes, uncomplicated: Secondary | ICD-10-CM | POA: Insufficient documentation

## 2018-01-14 DIAGNOSIS — R1084 Generalized abdominal pain: Secondary | ICD-10-CM | POA: Diagnosis not present

## 2018-01-14 DIAGNOSIS — M792 Neuralgia and neuritis, unspecified: Secondary | ICD-10-CM

## 2018-01-14 DIAGNOSIS — K8689 Other specified diseases of pancreas: Secondary | ICD-10-CM | POA: Insufficient documentation

## 2018-01-14 DIAGNOSIS — K869 Disease of pancreas, unspecified: Secondary | ICD-10-CM | POA: Diagnosis not present

## 2018-01-14 MED ORDER — VARENICLINE TARTRATE 1 MG PO TABS: 1.0000 mg | ORAL_TABLET | Freq: Two times a day (BID) | ORAL | 3 refills | Status: DC

## 2018-01-14 MED ORDER — PANTOPRAZOLE SODIUM 40 MG PO TBEC: 40.0000 mg | DELAYED_RELEASE_TABLET | Freq: Every day | ORAL | 5 refills | Status: DC

## 2018-01-14 MED ORDER — SUCRALFATE 1 GM/10ML PO SUSP: 1.0000 g | Freq: Three times a day (TID) | ORAL | 1 refills | Status: DC

## 2018-01-14 MED ORDER — VARENICLINE TARTRATE 0.5 MG X 11 & 1 MG X 42 PO MISC: ORAL | 0 refills | Status: DC

## 2018-01-14 MED ORDER — NICOTINE 21 MG/24HR TD PT24: 21.0000 mg | MEDICATED_PATCH | Freq: Every day | TRANSDERMAL | 3 refills | Status: DC

## 2018-01-14 NOTE — Assessment & Plan Note (Signed)
Has stopped taking his protonix because he didn't know what it was for. Had been taking a lot of ibuprofen. Will restart his protonix and start carafate to help with abdominal pain. Call with any concerns.

## 2018-01-14 NOTE — Assessment & Plan Note (Signed)
Chronic. Saw pain management previously and they declined to start taking over his medicine due to a history of incarceration. Continues with severe pain and is not feeling well. Will refer him to a different pain management in [place]. Referral generated today. Call with any concerns.

## 2018-01-14 NOTE — Progress Notes (Signed)
BP (!) 160/103 (BP Location: Left Arm, Patient Position: Sitting, Cuff Size: Normal)   Pulse 73   Temp 98.1 F (36.7 C)   Wt 181 lb 6 oz (82.3 kg)   SpO2 97%   BMI 24.60 kg/m    Subjective:    Patient ID: Johnny [name]., male    DOB: [birth year], 47 y.o.   MRN: [id number]  HPI: Johnny [name]. is a 47 y.o. male  Chief Complaint  Patient presents with  . Nicotine Dependence    Patient states that he stopped smoking yesterday, but he is having a difficult time, he is really wanting a cigarette    SMOKING CESSATION Smoking Status: every day smoker Smoking Amount: 1.5ppd Smoking Onset: 47yo Smoking Quit Date: 01/13/18 Smoking triggers: being around people who smoke, stress, driving Type of tobacco use: cigarettes Children in the house: no Other household members who smoke: yes Treatments attempted: nicotine patch, wellbutrin Pneumovax: Refused  Had CT in the ER which showed: "1. 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 Further evaluation with EGD, endoscopic ultrasound and potential biopsy is strongly recommended in the near future." To have EGD with Ultrasound done 01/17/18- scheduled.    Has been having a lot of abdominal pain since his colonoscopy. Was diagnosed with ulcers, but hasn't been taking his protonix because he didn't know why he was taking it.   Has been having pain in his L shoulder, has been having a lot of pain with movement since having his biceps tendon repair several years ago.   Pain has continued. Aching all over. Lots of joint pain. Would like to see pain management.   Relevant past medical, surgical, family and social history reviewed and updated as indicated. Interim medical history since our last visit reviewed. Allergies and  medications reviewed and updated.  Review of Systems  Constitutional: Negative.   Respiratory: Negative.   Cardiovascular: Negative.   Musculoskeletal: Positive for arthralgias, back pain, myalgias, neck pain and neck stiffness. Negative for gait problem and joint swelling.  Neurological: Negative.   Psychiatric/Behavioral: Negative.     Per HPI unless specifically indicated above     Objective:    BP (!) 160/103 (BP Location: Left Arm, Patient Position: Sitting, Cuff Size: Normal)   Pulse 73   Temp 98.1 F (36.7 C)   Wt 181 lb 6 oz (82.3 kg)   SpO2 97%   BMI 24.60 kg/m   Wt Readings from Last 3 Encounters:  01/14/18 181 lb 6 oz (82.3 kg)  01/03/18 181 lb (82.1 kg)  12/31/17 179 lb (81.2 kg)    Physical Exam  Constitutional: He is oriented to person, place, and time. He appears well-developed and well-nourished. No distress.  HENT:  Head: Normocephalic and atraumatic.  Right Ear: Hearing normal.  Left Ear: Hearing normal.  Nose: Nose normal.  Eyes: Conjunctivae and lids are normal. Right eye exhibits no discharge. Left eye exhibits no discharge. No scleral icterus.  Cardiovascular: Normal rate, regular rhythm, normal heart sounds and intact distal pulses. Exam reveals no gallop and no friction rub.  No murmur heard. Pulmonary/Chest: Effort normal and breath sounds normal. No respiratory distress. He has no wheezes. He has no rales. He exhibits no tenderness.  Musculoskeletal: Normal range of motion.  Neurological: He is alert and oriented to person,  place, and time.  Skin: Skin is warm and intact. No rash noted. He is not diaphoretic. No erythema. No pallor.  Psychiatric: He has a normal mood and affect. His speech is normal and behavior is normal. Judgment and thought content normal. Cognition and memory are normal.  Nursing note and vitals reviewed.   Results for orders placed or performed during the hospital encounter of 12/31/17  Cancer antigen 19-9  Result Value  Ref Range   CA 19-9 1 0 - 35 U/mL      Assessment & Plan:   Problem List Items Addressed This Visit      Nervous and Auditory   RESOLVED: Bilateral sciatica   Relevant Medications   varenicline (CHANTIX STARTING MONTH PAK) 0.5 MG X 11 & 1 MG X 42 tablet   varenicline (CHANTIX CONTINUING MONTH PAK) 1 MG tablet   nicotine (NICODERM CQ - DOSED IN MG/24 HOURS) 21 mg/24hr patch   Other Relevant Orders   Ambulatory referral to Pain Clinic     Other   Chronic pain syndrome    Chronic. Saw pain management previously and they declined to start taking over his medicine due to a history of incarceration. Continues with severe pain and is not feeling well. Will refer him to a different pain management in [place]. Referral generated today. Call with any concerns.       Relevant Orders   Ambulatory referral to Pain Clinic   Acute pain due to trauma   Relevant Orders   Ambulatory referral to Pain Clinic   Neuropathic pain of hand   Relevant Orders   Ambulatory referral to Pain Clinic   Abdominal pain, generalized - Primary    Has stopped taking his protonix because he didn't know what it was for. Had been taking a lot of ibuprofen. Will restart his protonix and start carafate to help with abdominal pain. Call with any concerns.       Chronic neck pain   Pancreatic mass    Will await [place] to be done on Thursday. Await results.       Tobacco abuse    Will start chantix and nicoderm patches. Call with any concerns.        Other Visit Diagnoses    Chronic left shoulder pain       Would like to see orthopedics. Referral generated today.   Relevant Orders   Ambulatory referral to Orthopedic Surgery   Ambulatory referral to Pain Clinic       Follow up plan: Return 2-3 weeks, for following pancreatic US.

## 2018-01-14 NOTE — Telephone Encounter (Signed)
Received call from Mr. Johnny [name] stating that he needs to cancel his EUS for 1/24 due to him being out of town. Schedule adjusted and attempts made to have him in sooner were unsuccessful. EUS rescheduled for 02/14/18 with Dr. Cephas Darby. Offered to have it rescheduled sooner at another facility and he denied. Oncology Nurse Navigator Documentation  Navigator Location: [facility] (01/14/18 1400)   )Navigator Encounter Type: Telephone (01/14/18 1400) Telephone: Incoming Call (01/14/18 1400)                       Barriers/Navigation Needs: Coordination of Care (01/14/18 1400)   Interventions: Coordination of Care (01/14/18 1400)   Coordination of Care: EUS (01/14/18 1400)                  Time Spent with Patient: 30 (01/14/18 1400)

## 2018-01-14 NOTE — Assessment & Plan Note (Signed)
Will start chantix and nicoderm patches. Call with any concerns.

## 2018-01-14 NOTE — Assessment & Plan Note (Signed)
Will await [place] to be done on Thursday. Await results.

## 2018-01-29 ENCOUNTER — Ambulatory Visit: Payer: Medicaid Other | Admitting: Family Medicine

## 2018-01-29 NOTE — Addendum Note (Signed)
Encounter addended by: Helene Kelp, RT on: 01/29/2018 2:44 PM  Actions taken: Imaging Exam begun, Image imported

## 2018-01-30 ENCOUNTER — Ambulatory Visit (INDEPENDENT_AMBULATORY_CARE_PROVIDER_SITE_OTHER): Payer: Medicaid Other | Admitting: Orthopedic Surgery

## 2018-02-13 ENCOUNTER — Telehealth: Payer: Self-pay

## 2018-02-13 ENCOUNTER — Encounter: Payer: Self-pay | Admitting: *Deleted

## 2018-02-13 NOTE — Telephone Encounter (Signed)
Received call from Endoscopy that they have been unable to reach Johnny [name] for his EUS tomorrow. I called and left a voicemail for him to return call no later than 10:30am if he cannot make this appointment. Voicemail left on [phone]. Attempted to call emergency contact number and it does not have voicemail and there was no answer. Endoscopy has tried both listed numbers as well. Oncology Nurse Navigator Documentation  Navigator Location: [facility] (02/13/18 1600)   )Navigator Encounter Type: Telephone (02/13/18 1600) Telephone: Lahoma Crocker Call;Appt Confirmation/Clarification (02/13/18 1600)                       Barriers/Navigation Needs: Coordination of Care (02/13/18 1600)   Interventions: Coordination of Care (02/13/18 1600)   Coordination of Care: EUS (02/13/18 1600)                  Time Spent with Patient: 15 (02/13/18 1600)

## 2018-02-14 ENCOUNTER — Ambulatory Visit: Admission: RE | Admit: 2018-02-14 | Payer: Medicaid Other | Source: Ambulatory Visit | Admitting: Gastroenterology

## 2018-02-14 ENCOUNTER — Encounter: Admission: RE | Payer: Self-pay | Source: Ambulatory Visit

## 2018-02-14 ENCOUNTER — Telehealth: Payer: Self-pay

## 2018-02-14 SURGERY — UPPER ESOPHAGEAL ENDOSCOPIC ULTRASOUND (EUS)
Anesthesia: General

## 2018-02-14 NOTE — Telephone Encounter (Signed)
Mr. Johnny [name] has not returned call. Second voicemail left to return call. If we have not heard from him by 1030 I will notify Dr. Cephas Darby not to come in from [place]. I have left him my contact information as well as endoscopy's. If Mr. Johnny [name] does not show, this will be his second cancellation. I will notify Dr. Dorothey Baseman office to contact him regarding this procedure. Oncology Nurse Navigator Documentation  Navigator Location: [facility] (02/14/18 0900)   )Navigator Encounter Type: Telephone (02/14/18 0900) Telephone: Outgoing Call (02/14/18 0900)                               Coordination of Care: EUS (02/14/18 0900)                  Time Spent with Patient: 15 (02/14/18 0900)

## 2018-02-18 ENCOUNTER — Telehealth: Payer: Self-pay

## 2018-02-18 NOTE — Telephone Encounter (Signed)
This patient has been noncompliant and not shown up for his appointments for his pancreatic mass. Talk to [facility] about discharging this patient from our practice since he is noncompliant and sending him a certified letter.

## 2018-02-18 NOTE — Telephone Encounter (Signed)
Johnny [name] did not show for his scheduled EUS on 2/21 and we were unable to contact him regarding. If you would like him rescheduled, please refer back after speaking with him. Oncology Nurse Navigator Documentation  Navigator Location: [facility] (02/18/18 1000)   )Navigator Encounter Type: Telephone (02/18/18 1000)                                 Coordination of Care: EUS (02/18/18 1000)                  Time Spent with Patient: 15 (02/18/18 1000)

## 2018-02-21 NOTE — Telephone Encounter (Signed)
Jody, have you spoken with Dr. Allen Norris or Mariea Clonts about this noncompliant letter?

## 2018-03-21 ENCOUNTER — Telehealth: Payer: Self-pay | Admitting: Family Medicine

## 2018-03-21 NOTE — Telephone Encounter (Signed)
Copied from [place] [phone]. Topic: Referral - Status >> Mar 13, 2018  4:45 PM Arletha Grippe wrote: Reason for CRM: lori with Dr Mirna Mires office called - this pt is deemed not a candidate due to history of non compliance.  Please call to let pt know. If any questions please call at [phone]  >> Mar 19, 2018  2:11 PM Amada Kingfisher, CMA wrote: Referral has been updated with this information. Please advise.

## 2018-04-01 ENCOUNTER — Other Ambulatory Visit: Payer: Self-pay | Admitting: Family Medicine

## 2018-04-05 ENCOUNTER — Encounter: Payer: Self-pay | Admitting: Family Medicine

## 2018-04-05 ENCOUNTER — Ambulatory Visit (INDEPENDENT_AMBULATORY_CARE_PROVIDER_SITE_OTHER): Payer: Medicaid Other | Admitting: Family Medicine

## 2018-04-05 VITALS — BP 115/76 | HR 69 | Temp 98.7°F | Ht 72.0 in | Wt 185.1 lb

## 2018-04-05 DIAGNOSIS — Z5900 Homelessness unspecified: Secondary | ICD-10-CM

## 2018-04-05 DIAGNOSIS — K120 Recurrent oral aphthae: Secondary | ICD-10-CM

## 2018-04-05 DIAGNOSIS — J209 Acute bronchitis, unspecified: Secondary | ICD-10-CM | POA: Diagnosis not present

## 2018-04-05 MED ORDER — AZITHROMYCIN 250 MG PO TABS: ORAL_TABLET | ORAL | 0 refills | Status: DC

## 2018-04-05 MED ORDER — PREDNISONE 50 MG PO TABS: 50.0000 mg | ORAL_TABLET | Freq: Every day | ORAL | 0 refills | Status: DC

## 2018-04-05 MED ORDER — LIDOCAINE VISCOUS 2 % MT SOLN: 20.0000 mL | OROMUCOSAL | 4 refills | Status: DC | PRN

## 2018-04-05 NOTE — Patient Instructions (Signed)
Canker Sores Canker sores are small, painful sores that develop inside your mouth. They may also be called aphthous ulcers. You can get canker sores on the inside of your lips or cheeks, on your tongue, or anywhere inside your mouth. You can have just one canker sore or several of them. Canker sores cannot be passed from one person to another (noncontagious). These sores are different than the sores that you may get on the outside of your lips (cold sores or fever blisters). Canker sores usually start as painful red bumps. Then they turn into small white, yellow, or gray ulcers that have red borders. The ulcers may be quite painful. The pain may be worse when you eat or drink. What are the causes? The cause of this condition is not known. What increases the risk? This condition is more likely to develop in:  Women.  People in their teens or 20s.  Women who are having their menstrual period.  People who are under a lot of emotional stress.  People who do not get enough iron or B vitamins.  People who have poor oral hygiene.  People who have an injury inside the mouth. This can happen after having dental work or from chewing something hard.  What are the signs or symptoms? Along with the canker sore, symptoms may also include:  Fever.  Fatigue.  Swollen lymph nodes in your neck.  How is this diagnosed? This condition can be diagnosed based on your symptoms. Your health care provider will also examine your mouth. Your health care provider may also do tests if you get canker sores often or if they are very bad. Tests may include:  Blood tests to rule out other causes of canker sores.  Taking swabs from the sore to check for infection.  Taking a small piece of skin from the sore (biopsy) to test it for cancer.  How is this treated? Most canker sores clear up without treatment in about 10 days. Home care is usually the only treatment that you will need. Over-the-counter medicines  can relieve discomfort.If you have severe canker sores, your health care provider may prescribe:  Numbing ointment to relieve pain.  Vitamins.  Steroid medicines. These may be given as: ? Oral pills. ? Mouth rinses. ? Gels.  Antibiotic mouth rinse.  Follow these instructions at home:  Apply, take, or use medicines only as directed by your health care provider. These include vitamins.  If you were prescribed an antibiotic mouth rinse, finish all of it even if you start to feel better.  Until the sores are healed: ? Do not drink coffee or citrus juices. ? Do not eat spicy or salty foods.  Use a mild, over-the-counter mouth rinse as directed by your health care provider.  Practice good oral hygiene. ? Floss your teeth every day. ? Brush your teeth with a soft brush twice each day. Contact a health care provider if:  Your symptoms do not get better after two weeks.  You also have a fever or swollen glands.  You get canker sores often.  You have a canker sore that is getting larger.  You cannot eat or drink due to your canker sores. This information is not intended to replace advice given to you by your health care provider. Make sure you discuss any questions you have with your health care provider. Document Released: 04/07/2011 Document Revised: 05/18/2016 Document Reviewed: 11/11/2014 Elsevier Interactive Patient Education  2018 [employer] Inc[employer]  

## 2018-04-05 NOTE — Progress Notes (Signed)
BP 115/76   Pulse 69   Temp 98.7 F (37.1 C) (Oral)   Ht 6' (1.829 m)   Wt 185 lb 1.6 oz (84 kg)   SpO2 99%   BMI 25.10 kg/m    Subjective:    Patient ID: Johnny [name]., male    DOB: Apr 17, 1971, 47 y.o.   MRN: [id number]  HPI: Johnny [name]. is a 47 y.o. male  Chief Complaint  Patient presents with  . Laceration    pt states he has a cut inside his mouth that he noticied about a week ago, states it started small and has gotten larger   Has a cut in his mouth that has been there for about a week. Seems to be getting bigger. Not bleeding. Hurting. Can't eat. Doesn't remember doing anything, came on on it's own and got bigger.   UPPER RESPIRATORY TRACT INFECTION Duration: 2 weeks Worst symptom: congestion Fever: no Cough: yes Shortness of breath: no Wheezing: yes Chest pain: no Chest tightness: yes Chest congestion: yes Nasal congestion: yes Runny nose: yes Post nasal drip: yes Sneezing: yes Sore throat: yes Swollen glands: yes Sinus pressure: yes Headache: yes Face pain: yes Toothache: no Ear pain: no  Ear pressure: no  Eyes red/itching:no Eye drainage/crusting: yes  Vomiting: no Rash: no Fatigue: yes Sick contacts: yes Strep contacts: no  Context: worse Recurrent sinusitis: no Relief with OTC cold/cough medications: no  Treatments attempted: none    Relevant past medical, surgical, family and social history reviewed and updated as indicated. Interim medical history since our last visit reviewed. Allergies and medications reviewed and updated.  Review of Systems  Constitutional: Positive for chills and fatigue. Negative for activity change, appetite change, diaphoresis, fever and unexpected weight change.  HENT: Positive for congestion, ear pain, mouth sores, postnasal drip, rhinorrhea, sinus pressure, sinus pain, sneezing and sore throat. Negative for dental problem, drooling, ear discharge, facial swelling, hearing loss, nosebleeds,  tinnitus, trouble swallowing and voice change.   Eyes: Negative.   Respiratory: Positive for cough, chest tightness, shortness of breath and wheezing. Negative for apnea, choking and stridor.   Cardiovascular: Negative.   Psychiatric/Behavioral: Negative.     Per HPI unless specifically indicated above     Objective:    BP 115/76   Pulse 69   Temp 98.7 F (37.1 C) (Oral)   Ht 6' (1.829 m)   Wt 185 lb 1.6 oz (84 kg)   SpO2 99%   BMI 25.10 kg/m   Wt Readings from Last 3 Encounters:  04/05/18 185 lb 1.6 oz (84 kg)  01/14/18 181 lb 6 oz (82.3 kg)  01/03/18 181 lb (82.1 kg)    Physical Exam  Constitutional: He is oriented to person, place, and time. He appears well-developed and well-nourished. No distress.  HENT:  Head: Normocephalic and atraumatic.  Right Ear: Hearing and external ear normal.  Left Ear: Hearing and external ear normal.  Nose: Nose normal.  Mouth/Throat: Oropharynx is clear and moist. No oropharyngeal exudate.  Canker sore on R lower lip  Eyes: Pupils are equal, round, and reactive to light. Conjunctivae, EOM and lids are normal. Right eye exhibits no discharge. Left eye exhibits no discharge. No scleral icterus.  Neck: Normal range of motion. Neck supple. No JVD present. No tracheal deviation present. No thyromegaly present.  Cardiovascular: Normal rate, regular rhythm, normal heart sounds and intact distal pulses. Exam reveals no gallop and no friction rub.  No murmur heard. Pulmonary/Chest: Effort  normal. No stridor. No respiratory distress. He has wheezes. He exhibits no tenderness.  Musculoskeletal: Normal range of motion.  Lymphadenopathy:    He has cervical adenopathy.  Neurological: He is alert and oriented to person, place, and time.  Skin: Skin is warm, dry and intact. Capillary refill takes less than 2 seconds. No rash noted. He is not diaphoretic. No erythema.  Psychiatric: He has a normal mood and affect. His speech is normal and behavior is  normal. Judgment and thought content normal. Cognition and memory are normal.    Results for orders placed or performed during the hospital encounter of 12/31/17  Cancer antigen 19-9  Result Value Ref Range   CA 19-9 1 0 - 35 U/mL      Assessment & Plan:   Problem List Items Addressed This Visit    None    Visit Diagnoses    Acute bronchitis, unspecified organism    -  Primary   Will treat with prednisone and azithromycin. Recheck lungs 2 weeks.    Homeless single person       Referral to [facility] made today.   Relevant Orders   Ambulatory referral to [facility]   Canker sore       Viscous lidocaine. Avoid wearing bottom plate when possible        Follow up plan: Return in about 2 weeks (around 04/19/2018) for follow up breathing.

## 2018-04-09 ENCOUNTER — Telehealth: Payer: Self-pay | Admitting: Family Medicine

## 2018-04-09 NOTE — Telephone Encounter (Signed)
Called pt to speak with him about [facility] referral. I informed the pt the we had clothes to donate to him and he stated that he would be by this afternoon around 3pm to pick them up.  ---While on the line the pt stated that his cough was getting better. He stated that in the morning he has a bad cough but as the day goes on he gets better. He has coughed and hurt his neck. He is experiencing pain from the back of his ears to his neck and would like to know if he could have something sent to [facility].

## 2018-04-09 NOTE — Telephone Encounter (Signed)
He can take his muscle relaxer and tylenol/ibuprofen for that. Heat will also help. I'll have exercises for him up front to take when he comes to get the clothes.

## 2018-04-09 NOTE — Telephone Encounter (Signed)
Pt called back gave him message pt will come into office

## 2018-04-09 NOTE — Telephone Encounter (Signed)
Called and left a message to let the patient know Dr.Johnson's response.

## 2018-04-12 ENCOUNTER — Telehealth: Payer: Self-pay

## 2018-04-12 NOTE — Telephone Encounter (Signed)
Looks like referral was just placed on 04/05/2018 but it looks like referral was placed due to patient being homeless. Will send to Jill Alexanders to look into it more urgently.    Copied from [place] [phone]. Topic: Referral - Request >> Apr 12, 2018 11:56 AM Antonieta Iba C wrote: Reason for CRM: pt called in to follow up on referral for  [facility]. Please advise.    [phone]

## 2018-04-18 ENCOUNTER — Ambulatory Visit (INDEPENDENT_AMBULATORY_CARE_PROVIDER_SITE_OTHER): Payer: Medicaid Other | Admitting: Family Medicine

## 2018-04-18 ENCOUNTER — Encounter: Payer: Self-pay | Admitting: Family Medicine

## 2018-04-18 VITALS — BP 134/85 | HR 96 | Temp 98.9°F | Wt 176.1 lb

## 2018-04-18 DIAGNOSIS — G8929 Other chronic pain: Secondary | ICD-10-CM

## 2018-04-18 DIAGNOSIS — M25561 Pain in right knee: Secondary | ICD-10-CM | POA: Diagnosis not present

## 2018-04-18 DIAGNOSIS — J209 Acute bronchitis, unspecified: Secondary | ICD-10-CM

## 2018-04-18 MED ORDER — PREDNISONE 50 MG PO TABS: 50.0000 mg | ORAL_TABLET | Freq: Every day | ORAL | 0 refills | Status: DC

## 2018-04-18 NOTE — Progress Notes (Signed)
BP 134/85 (BP Location: Left Arm, Patient Position: Sitting, Cuff Size: Normal)   Pulse 96   Temp 98.9 F (37.2 C)   Wt 176 lb 2 oz (79.9 kg)   SpO2 97%   BMI 23.89 kg/m    Subjective:    Patient ID: Johnny Jewett., male    DOB: [birth year], 47 y.o.   MRN: [id number]  HPI: Johnny Kobler. is a 47 y.o. male  Chief Complaint  Patient presents with  . lung recheck  . Knee Pain   No fevers, no SOB, some cough. Has been smoking a lot more due to the stress.   KNEE PAIN Duration: chronic Involved knee: right Mechanism of injury: unknown Location:diffuse Onset: gradual Severity: severe  Quality:  sharp Frequency: constant Radiation: no Aggravating factors: walking   Alleviating factors: nothing  Status: worse Treatments attempted: rest, ice, heat, APAP, ibuprofen and aleve  Relief with NSAIDs?:  mild Weakness with weight bearing or walking: yes Sensation of giving way: yes Locking: yes Popping: yes Bruising: yes Swelling: yes Redness: no Paresthesias/decreased sensation: no Fevers: no   Relevant past medical, surgical, family and social history reviewed and updated as indicated. Interim medical history since our last visit reviewed. Allergies and medications reviewed and updated.  Review of Systems  Constitutional: Negative.   HENT: Negative.   Respiratory: Positive for cough, shortness of breath and wheezing. Negative for apnea, choking, chest tightness and stridor.   Cardiovascular: Negative.   Musculoskeletal: Positive for arthralgias and joint swelling. Negative for back pain, gait problem, myalgias, neck pain and neck stiffness.  Skin: Negative.   Neurological: Negative.   Psychiatric/Behavioral: Negative.     Per HPI unless specifically indicated above     Objective:    BP 134/85 (BP Location: Left Arm, Patient Position: Sitting, Cuff Size: Normal)   Pulse 96   Temp 98.9 F (37.2 C)   Wt 176 lb 2 oz (79.9 kg)   SpO2 97%   BMI  23.89 kg/m   Wt Readings from Last 3 Encounters:  04/18/18 176 lb 2 oz (79.9 kg)  04/05/18 185 lb 1.6 oz (84 kg)  01/14/18 181 lb 6 oz (82.3 kg)    Physical Exam  Constitutional: He is oriented to person, place, and time. He appears well-developed and well-nourished. No distress.  HENT:  Head: Normocephalic and atraumatic.  Right Ear: Hearing normal.  Left Ear: Hearing normal.  Nose: Nose normal.  Eyes: Conjunctivae and lids are normal. Right eye exhibits no discharge. Left eye exhibits no discharge. No scleral icterus.  Cardiovascular: Normal rate, regular rhythm, normal heart sounds and intact distal pulses. Exam reveals no gallop and no friction rub.  No murmur heard. Pulmonary/Chest: Effort normal. No stridor. No respiratory distress. He has wheezes in the right upper field, the right middle field, the right lower field, the left upper field, the left middle field and the left lower field. He has rhonchi in the right upper field, the right middle field, the right lower field, the left upper field, the left middle field and the left lower field. He has no rales. He exhibits no tenderness.  Musculoskeletal: Normal range of motion. He exhibits edema and tenderness. He exhibits no deformity.  Neurological: He is alert and oriented to person, place, and time.  Skin: Skin is warm, dry and intact. Capillary refill takes less than 2 seconds. No rash noted. He is not diaphoretic. No erythema. No pallor.  Psychiatric: He has a normal mood and  affect. His speech is normal and behavior is normal. Judgment and thought content normal. Cognition and memory are normal.    Results for orders placed or performed during the hospital encounter of 12/31/17  Cancer antigen 19-9  Result Value Ref Range   CA 19-9 1 0 - 35 U/mL      Assessment & Plan:   Problem List Items Addressed This Visit      Other   Knee pain - Primary   Relevant Orders   Ambulatory referral to Orthopedic Surgery    Other  Visit Diagnoses    Acute bronchitis, unspecified organism       Has continued. Will treat with another round of prednisone and recheck in about 10 days.        Follow up plan: Return 10 days.

## 2018-04-22 ENCOUNTER — Telehealth: Payer: Self-pay | Admitting: Family Medicine

## 2018-04-22 NOTE — Telephone Encounter (Signed)
Pt called back to see if Dr. Wynetta Emery can fit pt in before Thursday to get medication for pain or if he can see someone else, call pt to advise

## 2018-04-22 NOTE — Telephone Encounter (Signed)
Copied from [place] [phone]. Topic: Quick Communication - See Telephone Encounter >> Apr 22, 2018  1:22 PM Vernona Rieger wrote: CRM for notification. See Telephone encounter for: 04/22/18.  Patient said that he was in the office on 4/26 and Dr Wynetta Emery gave him prednisone for his knee. He said that he is in a lot of pain and wants to know if some pain medicine can be called in. He said if not, he is going to the ER Call back is [phone]

## 2018-04-22 NOTE — Telephone Encounter (Signed)
Patient notified, he states that he is currently on prednisone.

## 2018-04-22 NOTE — Telephone Encounter (Signed)
Please advise regarding patients question about working him in on thurs for pain medication  Thank you

## 2018-04-22 NOTE — Telephone Encounter (Signed)
Appointment scheduled with [facility] on 04/25/18 at 11:00am with Reche Dixon, patient notified.

## 2018-04-22 NOTE — Telephone Encounter (Signed)
I can give him another round of prednisone, otherwise he will need to take his ibuprofen and tylenol until he sees ortho

## 2018-04-22 NOTE — Telephone Encounter (Signed)
Cannot get pain medicine without an appointment- can we check on his orthopedic referral?

## 2018-04-25 DIAGNOSIS — Z96651 Presence of right artificial knee joint: Secondary | ICD-10-CM | POA: Insufficient documentation

## 2018-04-26 ENCOUNTER — Telehealth: Payer: Self-pay | Admitting: Family Medicine

## 2018-04-26 NOTE — Telephone Encounter (Signed)
Needs an appointment to get any type of pain medicine. Please remind him that we do not do chronic pain medicine.

## 2018-04-26 NOTE — Telephone Encounter (Signed)
Copied from [place] [phone]. Topic: Quick Communication - See Telephone Encounter >> Apr 26, 2018 10:52 AM Synthia Innocent wrote: CRM for notification. See Telephone encounter for: 04/26/18. Requesting something for pain to be called in , [facility] will not prescribe meds. Right Knee pain. Please advise. B[place]

## 2018-04-30 ENCOUNTER — Ambulatory Visit: Payer: Medicaid Other | Admitting: Family Medicine

## 2018-06-12 ENCOUNTER — Other Ambulatory Visit: Payer: Self-pay | Admitting: Family Medicine

## 2018-06-14 NOTE — Telephone Encounter (Signed)
Zanaflex 4 mg refill request  LOV 04/18/18 with Dr. Wynetta Emery.   04/30/18 - No Show  Last refill:  04/01/18  #90  1 refill  [facility] [place], [address]

## 2018-09-25 ENCOUNTER — Other Ambulatory Visit: Payer: Self-pay | Admitting: Family Medicine

## 2018-10-04 ENCOUNTER — Ambulatory Visit (INDEPENDENT_AMBULATORY_CARE_PROVIDER_SITE_OTHER): Payer: Medicaid Other | Admitting: Family Medicine

## 2018-10-04 ENCOUNTER — Encounter: Payer: Self-pay | Admitting: Family Medicine

## 2018-10-04 VITALS — BP 156/95 | HR 96 | Temp 98.4°F | Ht 72.0 in | Wt 200.2 lb

## 2018-10-04 DIAGNOSIS — G8929 Other chronic pain: Secondary | ICD-10-CM | POA: Diagnosis not present

## 2018-10-04 DIAGNOSIS — M25561 Pain in right knee: Secondary | ICD-10-CM

## 2018-10-04 MED ORDER — HYDROCODONE-ACETAMINOPHEN 5-325 MG PO TABS: 1.0000 | ORAL_TABLET | Freq: Two times a day (BID) | ORAL | 0 refills | Status: DC | PRN

## 2018-10-04 MED ORDER — ONDANSETRON 4 MG PO TBDP: 4.0000 mg | ORAL_TABLET | Freq: Three times a day (TID) | ORAL | 0 refills | Status: DC | PRN

## 2018-10-04 MED ORDER — PREDNISONE 10 MG PO TABS: ORAL_TABLET | ORAL | 0 refills | Status: DC

## 2018-10-04 NOTE — Progress Notes (Signed)
BP (!) 156/95   Pulse 96   Temp 98.4 F (36.9 C) (Oral)   Ht 6' (1.829 m)   Wt 200 lb 3.2 oz (90.8 kg)   SpO2 98%   BMI 27.15 kg/m    Subjective:    Patient ID: Johnny [name]., male    DOB: May 10, 1971, 47 y.o.   MRN: [id number]  HPI: Johnny [name]. is a 47 y.o. male  Chief Complaint  Patient presents with  . Knee Pain    pt states his R knee has been bothering him for about a month, states it feels like it wants to bend backwards   Right knee pain for about a month now. Pain is constant, feels like the joint is bending the opposite way and very unstable. Some swelling and heat. 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 Patient never followed up and additionally states he was not ever seen for this issue before. Wearing a brace, using ibuprofen, tylenol, ice packs, numbing spray with no relief. No known injury recently to account for worsening. Denies fevers, chills, redness.   Relevant past medical, surgical, family and social history reviewed and updated as indicated. Interim medical history since our last visit reviewed. Allergies and medications reviewed and updated.  Review of Systems  Per HPI unless specifically indicated above     Objective:    BP (!) 156/95   Pulse 96   Temp 98.4 F (36.9 C) (Oral)   Ht 6' (1.829 m)   Wt 200 lb 3.2 oz (90.8 kg)   SpO2 98%   BMI 27.15 kg/m   Wt Readings from Last 3 Encounters:  10/04/18 200 lb 3.2 oz (90.8 kg)  04/18/18 176 lb 2 oz (79.9 kg)  04/05/18 185 lb 1.6 oz (84 kg)    Physical Exam  Constitutional: He is oriented to person, place, and time. He appears well-developed and well-nourished.  HENT:  Head: Atraumatic.  Eyes: Conjunctivae and EOM are normal.  Cardiovascular: Normal rate and regular rhythm.  Pulmonary/Chest: Effort normal and breath sounds normal.  Musculoskeletal: He  exhibits edema (right knee effusion anteriorly) and tenderness (TTP b/l right knee).  Well healed total knee replacement scar present,no warmth or erythema. Mild crepitus with PROM. ROM appears intact but exam limited by pain.   Neurological: He is alert and oriented to person, place, and time.  Skin: Skin is warm and dry.  Psychiatric: He has a normal mood and affect. His behavior is normal.  Nursing note and vitals reviewed.   Results for orders placed or performed during the hospital encounter of 12/31/17  Cancer antigen 19-9  Result Value Ref Range   CA 19-9 1 0 - 35 U/mL      Assessment & Plan:   Problem List Items Addressed This Visit      Other   Knee pain - Primary    Acute exacerbation of chronic problem. Has seen pain management in the past for this and was told he was not a candidate for long term pain medication, refused interventional techniques or alternative treatments other than gabapentin. Has been seen several times in the past 1-2 years by orthopedics for this but has not followed up as instructed for possible revision surgery if not improving. Continue brace, gabapentin, NSAIDs. Will start prednisone and give very small supply (10 tabs) of pain medication to get him  to an orthopedic appt. Pt aware this is the only amount he will be getting from our office for this problem as he needs to be seeing orthopedics from now on for it. Sedation and addictive risks reviewed.           Follow up plan: Return if symptoms worsen or fail to improve.

## 2018-10-04 NOTE — Patient Instructions (Signed)
[place] in [place]

## 2018-10-07 NOTE — Assessment & Plan Note (Signed)
Acute exacerbation of chronic problem. Has seen pain management in the past for this and was told he was not a candidate for long term pain medication, refused interventional techniques or alternative treatments other than gabapentin. Has been seen several times in the past 1-2 years by orthopedics for this but has not followed up as instructed for possible revision surgery if not improving. Continue brace, gabapentin, NSAIDs. Will start prednisone and give very small supply (10 tabs) of pain medication to get him to an orthopedic appt. Pt aware this is the only amount he will be getting from our office for this problem as he needs to be seeing orthopedics from now on for it. Sedation and addictive risks reviewed.

## 2018-10-09 ENCOUNTER — Emergency Department: Payer: Medicaid Other

## 2018-10-09 ENCOUNTER — Emergency Department
Admission: EM | Admit: 2018-10-09 | Discharge: 2018-10-09 | Disposition: A | Discharge status: Home / Self Care | Payer: Medicaid Other | Attending: Emergency Medicine | Admitting: Emergency Medicine

## 2018-10-09 ENCOUNTER — Encounter: Payer: Self-pay | Admitting: Emergency Medicine

## 2018-10-09 DIAGNOSIS — Z87891 Personal history of nicotine dependence: Secondary | ICD-10-CM | POA: Diagnosis not present

## 2018-10-09 DIAGNOSIS — Y998 Other external cause status: Secondary | ICD-10-CM | POA: Insufficient documentation

## 2018-10-09 DIAGNOSIS — S8001XA Contusion of right knee, initial encounter: Secondary | ICD-10-CM

## 2018-10-09 DIAGNOSIS — Z91040 Latex allergy status: Secondary | ICD-10-CM | POA: Diagnosis not present

## 2018-10-09 DIAGNOSIS — Z79899 Other long term (current) drug therapy: Secondary | ICD-10-CM | POA: Diagnosis not present

## 2018-10-09 DIAGNOSIS — Y929 Unspecified place or not applicable: Secondary | ICD-10-CM | POA: Insufficient documentation

## 2018-10-09 DIAGNOSIS — Z96651 Presence of right artificial knee joint: Secondary | ICD-10-CM | POA: Insufficient documentation

## 2018-10-09 DIAGNOSIS — S8991XA Unspecified injury of right lower leg, initial encounter: Secondary | ICD-10-CM | POA: Diagnosis present

## 2018-10-09 DIAGNOSIS — W109XXA Fall (on) (from) unspecified stairs and steps, initial encounter: Secondary | ICD-10-CM | POA: Insufficient documentation

## 2018-10-09 DIAGNOSIS — Y9389 Activity, other specified: Secondary | ICD-10-CM | POA: Diagnosis not present

## 2018-10-09 DIAGNOSIS — W19XXXA Unspecified fall, initial encounter: Secondary | ICD-10-CM

## 2018-10-09 MED ORDER — NAPROXEN 500 MG PO TABS: 500.0000 mg | ORAL_TABLET | Freq: Two times a day (BID) | ORAL | 0 refills | Status: DC

## 2018-10-09 MED ORDER — HYDROCODONE-ACETAMINOPHEN 5-325 MG PO TABS: 1.0000 | ORAL_TABLET | Freq: Four times a day (QID) | ORAL | 0 refills | Status: DC | PRN

## 2018-10-09 NOTE — ED Triage Notes (Signed)
Pt reports fell 2 weeks ago walking up the stairs and hurt his knee. Pt reports now swelling to his knee and hx of three surgery's. Pt reports his MD told him to follow up with orthopedics but he has called the pas 2 days in a row and no one has called him back and he needs pain control.

## 2018-10-09 NOTE — ED Notes (Signed)
Jones wrap applied per order. Pt tolerated well.

## 2018-10-09 NOTE — ED Notes (Signed)
Pt observed resting in bed, NAD. Xray in room at this time.

## 2018-10-09 NOTE — Discharge Instructions (Addendum)
Call today make an appointment with [facility] orthopedic department.  Dr. Serita Grit contact information is listed on your discharge papers.  Wear Jones wrap for support and protection.  Also begin taking naproxen 500 mg twice daily with food.  Norco 1 every 6 hours as needed for pain.

## 2018-10-09 NOTE — ED Provider Notes (Signed)
[facility] Emergency Department Provider Note  ____________________________________________   First MD Initiated Contact with Patient 10/09/18 [phone]     (approximate)  I have reviewed the triage vital signs and the nursing notes.   HISTORY  Chief Complaint Knee Pain and Joint Swelling   HPI Johnny [name]. is a 47 y.o. male presents to the ED with complaint of continued right knee pain.  Patient states that he fell going up his steps 2 weeks ago and is continued to have pain since that time.  He is concerned because he had a total knee replacement 7 years ago and is continued to have some swelling.  He has called his orthopedist who has not called him back in the last 2 days.  Patient rates his pain as an 8/10.   Past Medical History:  Diagnosis Date  . Arthritis    hands, knees, lower back  . Bilateral sciatica   . Carpal tunnel syndrome   . Chronic back pain   . Chronic neck pain   . Chronic pain syndrome   . GERD (gastroesophageal reflux disease)   . Herniated disc   . Traumatic amputation of left thumb 2013  . Wears dentures    full upper and lower    Patient Active Problem List   Diagnosis Date Noted  . Pancreatic mass 01/14/2018  . Tobacco abuse 01/14/2018  . Chronic neck pain   . Diarrhea   . Abdominal pain, generalized   . Benign neoplasm of descending colon   . Benign neoplasm of ascending colon   . Intractable vomiting with nausea   . Reflux esophagitis   . Duodenal ulcer without hemorrhage or perforation   . Chronic pain syndrome 08/07/2017  . Vitamin D deficiency 06/20/2017  . Insomnia 06/19/2017  . Swelling of hand 08/20/2013  . Hand joint pain 07/02/2013  . Presence of artificial knee joint 01/17/2013  . Knee pain 01/17/2013  . 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 Acute pain due to trauma 09/23/2012  . 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 Back pain 09/09/2012    Past Surgical History:  Procedure  Laterality Date  . AMPUTATION FINGER / THUMB Left 2013  . COLONOSCOPY WITH PROPOFOL N/A 01/03/2018   Procedure: COLONOSCOPY WITH PROPOFOL;  Surgeon: [name], MD;  Location: [place];  Service: Endoscopy;  Laterality: N/A;  . ESOPHAGOGASTRODUODENOSCOPY (EGD) WITH PROPOFOL N/A 01/03/2018   Procedure: ESOPHAGOGASTRODUODENOSCOPY (EGD) WITH PROPOFOL;  Surgeon: [name], MD;  Location: [place];  Service: Endoscopy;  Laterality: N/A;  . HERNIA REPAIR    . HIP SURGERY    . JOINT REPLACEMENT Right    knee  . KNEE SURGERY    . neck fusion    . POLYPECTOMY N/A 01/03/2018   Procedure: POLYPECTOMY;  Surgeon: [name], MD;  Location: [place];  Service: Endoscopy;  Laterality: N/A;  . REPLACEMENT TOTAL KNEE Right   . SHOULDER SURGERY Right 2016 X 2  . SPINAL FUSION    . TONSILLECTOMY      Prior to Admission medications   Medication Sig Start Date End Date Taking? Authorizing Provider  gabapentin (NEURONTIN) 600 MG tablet TAKE 1 TABLET BY MOUTH THREE TIMES A DAY 09/25/18   Johnson, Megan P, DO  HYDROcodone-acetaminophen (NORCO/VICODIN) 5-325 MG tablet Take 1 tablet by mouth every 6 (six) hours as needed for moderate pain. 10/09/18   Johnn Hai, PA-C  naproxen (NAPROSYN) 500 MG tablet Take 1 tablet (  500 mg total) by mouth 2 (two) times daily with a meal. 10/09/18   Letitia Neri L, PA-C  ondansetron (ZOFRAN ODT) 4 MG disintegrating tablet Take 1 tablet (4 mg total) by mouth every 8 (eight) hours as needed. 10/04/18   Volney American, PA-C  predniSONE (DELTASONE) 10 MG tablet Take 6 tabs day one, 5 tabs day two, 4 tabs day three, etc 10/04/18   Volney American, PA-C  tiZANidine (ZANAFLEX) 4 MG tablet TAKE 1 TABLET (4 MG TOTAL) BY MOUTH EVERY 8 (EIGHT) HOURS AS NEEDED FOR MUSCLE SPASMS. Patient not taking: Reported on 10/04/2018 06/14/18   Valerie Roys, DO    Allergies Crab [shellfish allergy]; Flexeril [cyclobenzaprine]; Codeine;  Latex; Penicillins; Sulfamethoxazole-trimethoprim; and Tramadol  Family History  Problem Relation Age of Onset  . Cancer Father        sarcoma  . Cirrhosis Paternal Grandmother   . Cancer Paternal Grandfather        Pancreatic  . Fibromyalgia Sister   . Deafness Sister     Social History Social History   Tobacco Use  . Smoking status: Former Smoker    Packs/day: 0.50    Years: 25.00    Pack years: 12.50    Types: Cigarettes    Last attempt to quit: 01/13/2018    Years since quitting: 0.7  . Smokeless tobacco: Never Used  Substance Use Topics  . Alcohol use: No  . Drug use: No    Review of Systems Constitutional: No fever/chills Cardiovascular: Denies chest pain. Respiratory: Denies shortness of breath. Musculoskeletal: Positive right knee pain. Skin: Negative for rash. Neurological: Negative for headaches, focal weakness or numbness. ____________________________________________   PHYSICAL EXAM:  VITAL SIGNS: ED Triage Vitals  Enc Vitals Group     BP 10/09/18 0900 (!) 151/94     Pulse Rate 10/09/18 0900 76     Resp 10/09/18 0900 16     Temp 10/09/18 0900 98.4 F (36.9 C)     Temp Source 10/09/18 0900 Oral     SpO2 10/09/18 0900 96 %     Weight 10/09/18 0901 200 lb (90.7 kg)     Height 10/09/18 0901 6' (1.829 m)     Head Circumference --      Peak Flow --      Pain Score 10/09/18 0901 8     Pain Loc --      Pain Edu? --      Excl. in [place]? --    Constitutional: Alert and oriented. Well appearing and in no acute distress. Eyes: Conjunctivae are normal.  Head: Atraumatic. Nose: No trauma. Neck: No stridor.   Cardiovascular: Normal rate, regular rhythm. Grossly normal heart sounds.  Good peripheral circulation. Respiratory: Normal respiratory effort.  No retractions. Lungs CTAB. Musculoskeletal: Examination of the right knee there is no gross deformity however there is a well-healed surgical scar vertically on the anterior plane.  There is no effusion  present.  Patient is able to flex and extend his knee and no crepitus is appreciated.  Skin is intact.  There is no ecchymosis or abrasions seen.  Nontender right hip and distally around the ankle are nontender.  No evidence of abrasions or ecchymosis.  Patient is ambulatory without assistance. Neurologic:  Normal speech and language. No gross focal neurologic deficits are appreciated. No gait instability. Skin:  Skin is warm, dry and intact.  Psychiatric: Mood and affect are normal. Speech and behavior are normal.  ____________________________________________   LABS (all labs  ordered are listed, but only abnormal results are displayed)  Labs Reviewed - No data to display  RADIOLOGY  ED MD interpretation:  Right knee x-ray is negative for acute bony injury.  Official radiology report(s): Dg Knee Complete 4 Views Right  Result Date: 10/09/2018 CLINICAL DATA:  Right knee pain after fall on stairs 2 weeks ago. EXAM: RIGHT KNEE - COMPLETE 4+ VIEW COMPARISON:  Radiographs of November 01, 2017. FINDINGS: Status post right total knee arthroplasty. The femoral and tibial components appear to be well situated. No fracture or dislocation is noted. Soft tissues are unremarkable. IMPRESSION: Status post right total knee arthroplasty. No acute abnormality seen in the right knee. Electronically Signed   By: Marijo Conception, M.D.   On: 10/09/2018 10:32    ____________________________________________   PROCEDURES  Procedure(s) performed: Ronnald Ramp wrap was placed on the right knee by [employer] RN  Procedures  Critical Care performed: No  ____________________________________________   INITIAL IMPRESSION / ASSESSMENT AND PLAN / ED COURSE  As part of my medical decision making, I reviewed the following data within the electronic MEDICAL RECORD NUMBER Notes from prior ED visits and [place] Controlled Substance Database  Patient presents to the ED with complaint of right knee pain after falling 2 weeks ago.  He  is status post total knee replacement for 7 years.  He states that he has taken over-the-counter medication without any relief.  X-rays were reassuring and patient was made aware that his total knee is still in good alignment and no acute bony injury was seen.  Patient was given a prescription for naproxen 500 mg twice daily with food and hydrocodone 1 every 6 hours as needed for pain.  Patient is to follow-up with [facility] orthopedic department.  He was placed in a Jones wrap to give him some support and protection.  Ice and elevation as needed for pain or swelling.  ____________________________________________   FINAL CLINICAL IMPRESSION(S) / ED DIAGNOSES  Final diagnoses:  Contusion of right knee, initial encounter  Fall, initial encounter     ED Discharge Orders         Ordered    HYDROcodone-acetaminophen (NORCO/VICODIN) 5-325 MG tablet  Every 6 hours PRN     10/09/18 1047    naproxen (NAPROSYN) 500 MG tablet  2 times daily with meals     10/09/18 1047           Note:  This document was prepared using Dragon voice recognition software and may include unintentional dictation errors.    Johnn Hai, PA-C 10/09/18 1254    Schuyler Amor, MD 10/09/18 [phone]

## 2018-10-25 ENCOUNTER — Telehealth: Payer: Self-pay | Admitting: Family Medicine

## 2018-10-25 NOTE — Telephone Encounter (Signed)
Pharmacy notified.

## 2018-10-25 NOTE — Telephone Encounter (Signed)
Copied from [id number]. Topic: Quick Communication - Rx Refill/Question >> Oct 25, 2018  9:36 AM Bea Graff, NT wrote: Medication: tiZANidine (ZANAFLEX) 4 MG tablet and gabapentin (NEURONTIN) 600 MG tablet (pt states lost medication when moving from his house last night)  Has the patient contacted their pharmacy? Yes.   (Agent: If no, request that the patient contact the pharmacy for the refill.) (Agent: If yes, when and what did the pharmacy advise?)  Preferred Pharmacy (with phone number or street name): [id number] - [place], [address] [phone] (Phone) [phone] (Fax)    Agent: Please be advised that RX refills may take up to 3 business days. We ask that you follow-up with your pharmacy.

## 2018-10-25 NOTE — Telephone Encounter (Signed)
He can have a 30 day supply of his gabapentin early, but not the 90 day- if they need me to call in a new Rx, please let me know.

## 2018-10-25 NOTE — Telephone Encounter (Signed)
Patient called and discussed refill requests. Advised the Gabapentin was sent in on 09/25/18 3 month supply and it's reported on the chart that Zanaflex patient is not taking. Patient says "I picked up the Gabapentin last month, but I had to move out of my house abruptly last night and I either lost it in the move or left it at the house and I can't go back there to look. So, I was calling to see if the doctor will authorize me to get another refill early. I told them I didn't take the Zanaflex because I was out at the time. I will need it refilled." I advised he will have to pay out of pocket for the Gabapentin, because the refill is not due, he verbalized understanding and says he is willing to do that. I called [facility] and spoke to [facility], Merchant navy officer about the requested medications. She says the provider will need to call to give the ok to refill the Gabapentin early due to some issues the patient had in the past. No refill needs to be sent, because he has 1 on file, just a phone call. She says he has a refill left on the Zanaflex and she will go ahead and refill. Patient was called and advised, he verbalized understanding.

## 2018-10-25 NOTE — Telephone Encounter (Signed)
Patient called to say that Pharmacy is willing to fill his gabapentin (NEURONTIN) 600 MG tablet but need someone to call in and authorize it.

## 2018-11-08 ENCOUNTER — Ambulatory Visit: Payer: Medicaid Other | Admitting: Family Medicine

## 2019-01-24 ENCOUNTER — Ambulatory Visit: Payer: Medicaid Other | Admitting: Family Medicine

## 2019-02-26 ENCOUNTER — Other Ambulatory Visit: Payer: Self-pay | Admitting: Family Medicine

## 2019-02-26 NOTE — Telephone Encounter (Signed)
Requested medication (s) are due for refill today: Yes  Requested medication (s) are on the active medication list: Yes  Last refill:  06/12/18  Future visit scheduled: No  Notes to clinic:  Unable to refill, cannot delegate.     Requested Prescriptions  Pending Prescriptions Disp Refills   tiZANidine (ZANAFLEX) 4 MG tablet [Pharmacy Med Name: TIZANIDINE HCL 4 MG TABLET] 90 tablet 1    Sig: TAKE 1 TABLET (4 MG TOTAL) BY MOUTH EVERY 8 (EIGHT) HOURS AS NEEDED FOR MUSCLE SPASMS.     Not Delegated - Cardiovascular:  Alpha-2 Agonists - tizanidine Failed - 02/26/2019  4:33 PM      Failed - This refill cannot be delegated      Passed - Valid encounter within last 6 months    Recent Outpatient Visits          4 months ago Chronic pain of right knee   [place], [place], [place]   10 months ago Chronic pain of right knee   [place], Megan P, DO   10 months ago Acute bronchitis, unspecified organism   [place], [place], DO   1 year ago Abdominal pain, generalized   [place], [place], DO   1 year ago Chest pain, unspecified type   [facility], Megan P, DO           Signed Prescriptions Disp Refills   gabapentin (NEURONTIN) 600 MG tablet 270 tablet 3    Sig: TAKE 1 TABLET BY MOUTH THREE TIMES A DAY     Neurology: Anticonvulsants - gabapentin Passed - 02/26/2019  4:33 PM      Passed - Valid encounter within last 12 months    Recent Outpatient Visits          4 months ago Chronic pain of right knee   [facility] Volney American, [place]   10 months ago Chronic pain of right knee   [place], Megan P, DO   10 months ago Acute bronchitis, unspecified organism   [place], [place], DO   1 year ago Abdominal pain, generalized   [place], DO   1 year ago Chest pain,  unspecified type   [facility], [place], DO

## 2019-02-27 NOTE — Telephone Encounter (Signed)
Sent to [facility] manager for review.  I will let you know when I receive a response.

## 2019-02-27 NOTE — Telephone Encounter (Signed)
Hi Johnny [name], I'm sending this as a reminder to talk to you- we need to talk about the protocol for gabapentin- it should never be refilled for a year and it's unclear on the protocol.

## 2019-04-01 ENCOUNTER — Other Ambulatory Visit: Payer: Self-pay

## 2019-04-01 ENCOUNTER — Emergency Department: Payer: Medicaid Other

## 2019-04-01 ENCOUNTER — Emergency Department
Admission: EM | Admit: 2019-04-01 | Discharge: 2019-04-01 | Discharge status: Against Medical Advice | Payer: Medicaid Other | Attending: Emergency Medicine | Admitting: Emergency Medicine

## 2019-04-01 ENCOUNTER — Encounter: Payer: Self-pay | Admitting: Emergency Medicine

## 2019-04-01 DIAGNOSIS — Z87891 Personal history of nicotine dependence: Secondary | ICD-10-CM | POA: Insufficient documentation

## 2019-04-01 DIAGNOSIS — Z96651 Presence of right artificial knee joint: Secondary | ICD-10-CM | POA: Diagnosis not present

## 2019-04-01 DIAGNOSIS — M549 Dorsalgia, unspecified: Secondary | ICD-10-CM | POA: Diagnosis present

## 2019-04-01 DIAGNOSIS — Z91040 Latex allergy status: Secondary | ICD-10-CM | POA: Diagnosis not present

## 2019-04-01 LAB — CBC
HCT: 45.8 % (ref 39.0–52.0)
Hemoglobin: 15.8 g/dL (ref 13.0–17.0)
MCH: 32 pg (ref 26.0–34.0)
MCHC: 34.5 g/dL (ref 30.0–36.0)
MCV: 92.9 fL (ref 80.0–100.0)
Platelets: 219 10*3/uL (ref 150–400)
RBC: 4.93 MIL/uL (ref 4.22–5.81)
RDW: 13.1 % (ref 11.5–15.5)
WBC: 4.7 10*3/uL (ref 4.0–10.5)
nRBC: 0 % (ref 0.0–0.2)

## 2019-04-01 LAB — COMPREHENSIVE METABOLIC PANEL
ALT: 16 U/L (ref 0–44)
AST: 21 U/L (ref 15–41)
Albumin: 3.8 g/dL (ref 3.5–5.0)
Alkaline Phosphatase: 72 U/L (ref 38–126)
Anion gap: 11 (ref 5–15)
BUN: 10 mg/dL (ref 6–20)
CO2: 24 mmol/L (ref 22–32)
Calcium: 8.7 mg/dL — ABNORMAL LOW (ref 8.9–10.3)
Chloride: 106 mmol/L (ref 98–111)
Creatinine, Ser: 1 mg/dL (ref 0.61–1.24)
GFR calc Af Amer: >60 mL/min (ref >60)
GFR calc non Af Amer: >60 mL/min (ref >60)
Glucose, Bld: 133 mg/dL — ABNORMAL HIGH (ref 70–99)
Potassium: 3.4 mmol/L — ABNORMAL LOW (ref 3.5–5.1)
Sodium: 141 mmol/L (ref 135–145)
Total Bilirubin: 0.7 mg/dL (ref 0.3–1.2)
Total Protein: 6.6 g/dL (ref 6.5–8.1)

## 2019-04-01 LAB — LIPASE, BLOOD: Lipase: 30 U/L (ref 11–51)

## 2019-04-01 LAB — TROPONIN I: Troponin I: <0.03 ng/mL (ref <0.03)

## 2019-04-01 LAB — FIBRIN DERIVATIVES D-DIMER (ARMC ONLY): Fibrin derivatives D-dimer (ARMC): 289.47 ng/mL (FEU) (ref 0.00–499.00)

## 2019-04-01 MED ORDER — KETOROLAC TROMETHAMINE 30 MG/ML IJ SOLN
30.0000 mg | Freq: Once | INTRAMUSCULAR | Status: AC
  Administered 2019-04-01: 14:00:00 30 mg via INTRAVENOUS
  Filled 2019-04-01: qty 1

## 2019-04-01 NOTE — ED Notes (Addendum)
Pt not found in room when RN went to room, gown and leads taken off. No evidence of IV catheter in trash. This RN called pt and informed pt he must come back to ED to take out IV, states he took it out, pt informed he must still come to ED for evidence that there is no IV in place.

## 2019-04-01 NOTE — ED Provider Notes (Signed)
[facility] Emergency Department Provider Note       Time seen: ----------------------------------------- 1:27 PM on 04/01/2019 -----------------------------------------   I have reviewed the triage vital signs and the nursing notes.  HISTORY   Chief Complaint Back Pain    HPI Johnny [name]. is a 48 y.o. male with a history of arthritis, chronic back pain, chronic neck pain, chronic pain, herniated disc who presents to the ED for mid back pain that started 3 days ago.  Patient has pain from mid back to go straight to his chest.  He denies cough, sore throat or fever.  It hurts when he takes a deep breath.  Past Medical History:  Diagnosis Date  . Arthritis    hands, knees, lower back  . Bilateral sciatica   . Carpal tunnel syndrome   . Chronic back pain   . Chronic neck pain   . Chronic pain syndrome   . GERD (gastroesophageal reflux disease)   . Herniated disc   . Traumatic amputation of left thumb 2013  . Wears dentures    full upper and lower    Patient Active Problem List   Diagnosis Date Noted  . Pancreatic mass 01/14/2018  . Tobacco abuse 01/14/2018  . Chronic neck pain   . Diarrhea   . Abdominal pain, generalized   . Benign neoplasm of descending colon   . Benign neoplasm of ascending colon   . Intractable vomiting with nausea   . Reflux esophagitis   . Duodenal ulcer without hemorrhage or perforation   . Chronic pain syndrome 08/07/2017  . Vitamin D deficiency 06/20/2017  . Insomnia 06/19/2017  . Swelling of hand 08/20/2013  . Hand joint pain 07/02/2013  . Presence of artificial knee joint 01/17/2013  . Knee pain 01/17/2013  . 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 Acute pain due to trauma 09/23/2012  . 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 Back pain 09/09/2012    Past Surgical History:  Procedure Laterality Date  . AMPUTATION FINGER / THUMB Left 2013  . COLONOSCOPY WITH PROPOFOL N/A 01/03/2018   Procedure: COLONOSCOPY WITH PROPOFOL;  Surgeon: [name], MD;  Location: [place];  Service: Endoscopy;  Laterality: N/A;  . ESOPHAGOGASTRODUODENOSCOPY (EGD) WITH PROPOFOL N/A 01/03/2018   Procedure: ESOPHAGOGASTRODUODENOSCOPY (EGD) WITH PROPOFOL;  Surgeon: [name], MD;  Location: [place];  Service: Endoscopy;  Laterality: N/A;  . HERNIA REPAIR    . HIP SURGERY    . JOINT REPLACEMENT Right    knee  . KNEE SURGERY    . neck fusion    . POLYPECTOMY N/A 01/03/2018   Procedure: POLYPECTOMY;  Surgeon: [name], MD;  Location: [place];  Service: Endoscopy;  Laterality: N/A;  . REPLACEMENT TOTAL KNEE Right   . SHOULDER SURGERY Right 2016 X 2  . SPINAL FUSION    . TONSILLECTOMY      Allergies Crab [shellfish allergy]; Flexeril [cyclobenzaprine]; Codeine; Latex; Penicillins; Sulfamethoxazole-trimethoprim; and Tramadol  Social History Social History   Tobacco Use  . Smoking status: Former Smoker    Packs/day: 0.50    Years: 25.00    Pack years: 12.50    Types: Cigarettes    Last attempt to quit: 01/13/2018    Years since quitting: 1.2  . Smokeless tobacco: Never Used  Substance Use Topics  . Alcohol use: No  . Drug use: No   Review of Systems Constitutional: Negative for fever. Cardiovascular: Positive for chest pain Respiratory: Negative for  shortness of breath. Gastrointestinal: Negative for abdominal pain, vomiting and diarrhea. Musculoskeletal: Positive for back pain Skin: Negative for rash. Neurological: Negative for headaches, focal weakness or numbness.  All systems negative/normal/unremarkable except as stated in the HPI  ____________________________________________   PHYSICAL EXAM:  VITAL SIGNS: ED Triage Vitals  Enc Vitals Group     BP 04/01/19 1326 128/77     Pulse Rate 04/01/19 1326 88     Resp --      Temp 04/01/19 1326 97.8 F (36.6 C)     Temp Source 04/01/19 1326 Oral     SpO2 04/01/19 1326 97 %      Weight 04/01/19 1324 190 lb (86.2 kg)     Height 04/01/19 1324 6' (1.829 m)     Head Circumference --      Peak Flow --      Pain Score 04/01/19 1324 8     Pain Loc --      Pain Edu? --      Excl. in [place]? --    Constitutional: Alert and oriented. Well appearing and in no distress. Eyes: Conjunctivae are normal. Normal extraocular movements. ENT      Head: Normocephalic and atraumatic.      Nose: No congestion/rhinnorhea.      Mouth/Throat: Mucous membranes are moist.      Neck: No stridor. Cardiovascular: Normal rate, regular rhythm. No murmurs, rubs, or gallops. Respiratory: Normal respiratory effort without tachypnea nor retractions. Breath sounds are clear and equal bilaterally. No wheezes/rales/rhonchi. Gastrointestinal: Soft and nontender. Normal bowel sounds Musculoskeletal: Nontender with normal range of motion in extremities. No lower extremity tenderness nor edema. Neurologic:  Normal speech and language. No gross focal neurologic deficits are appreciated.  Skin:  Skin is warm, dry and intact. No rash noted. Psychiatric: Mood and affect are normal. Speech and behavior are normal.  ____________________________________________  EKG: Interpreted by me.  Sinus rhythm with rate of 86 bpm, possible septal infarct age-indeterminate, normal axis, normal QT  ____________________________________________  ED COURSE:  As part of my medical decision making, I reviewed the following data within the [place] History obtained from family if available, nursing notes, old chart and ekg, as well as notes from prior ED visits. Patient presented for back and chest pain, we will assess with labs and imaging as indicated at this time.   Procedures  [name]. was evaluated in Emergency Department on 04/01/2019 for the symptoms described in the history of present illness. He was evaluated in the context of the global COVID-19 pandemic, which necessitated consideration  that the patient might be at risk for infection with the SARS-CoV-2 virus that causes COVID-19. Institutional protocols and algorithms that pertain to the evaluation of patients at risk for COVID-19 are in a state of rapid change based on information released by regulatory bodies including the CDC and federal and state organizations. These policies and algorithms were followed during the patient's care in the ED.  ____________________________________________   LABS (pertinent positives/negatives)  Labs Reviewed  COMPREHENSIVE METABOLIC PANEL - Abnormal; Notable for the following components:      Result Value   Potassium 3.4 (*)    Glucose, Bld 133 (*)    Calcium 8.7 (*)    All other components within normal limits  CBC  TROPONIN I  FIBRIN DERIVATIVES D-DIMER ([facility] ONLY)  LIPASE, BLOOD    RADIOLOGY Images were viewed by me  Chest x-ray IMPRESSION: No edema or consolidation.  ____________________________________________  DIFFERENTIAL DIAGNOSIS   Musculoskeletal pain, spasm, PE, dissection, unstable angina  FINAL ASSESSMENT AND PLAN  Back pain, chest pain   Plan: The patient had presented for back and chest pain. Patient's labs are unremarkable. Patient's imaging was negative.  Patient eloped, did receive Toradol.   Laurence Aly, MD    Note: This note was generated in part or whole with voice recognition software. Voice recognition is usually quite accurate but there are transcription errors that can and very often do occur. I apologize for any typographical errors that were not detected and corrected.     Earleen Newport, MD 04/01/19 [phone]

## 2019-04-01 NOTE — ED Triage Notes (Signed)
C/o mid back pain starting 3 days ago.  Pain from mid back goes straight through to chest. Denies cough, sore throat, fever.  Hurst worse when takes deep breath.

## 2019-04-01 NOTE — ED Notes (Signed)
PT came back to triage and this RN witnessed IV catheter already remvoed. PT has noted hematoma at site, states " yall did this to me, is this going to go down or give me a blood clot and die" this RN informed pt that because he pulled catheter out and di dnot apply pressure to IV site hematoma was formed.

## 2019-04-15 ENCOUNTER — Telehealth: Payer: Self-pay | Admitting: Family Medicine

## 2019-04-15 NOTE — Telephone Encounter (Signed)
Copied from [place] [id number]. Topic: General - Other >> Apr 14, 2019 12:31 PM Carolyn Stare wrote:  Pt req an appt for shoulder and neck pain, said he may have a hernia in his stomach

## 2019-04-15 NOTE — Telephone Encounter (Signed)
Please schedule virtual visit

## 2019-04-16 NOTE — Telephone Encounter (Signed)
Will call pt back to make sure he has skype working as he couldn't do it while on the phone.

## 2019-04-16 NOTE — Telephone Encounter (Signed)
Done

## 2019-04-17 ENCOUNTER — Other Ambulatory Visit: Payer: Self-pay

## 2019-04-17 ENCOUNTER — Ambulatory Visit (INDEPENDENT_AMBULATORY_CARE_PROVIDER_SITE_OTHER): Payer: Medicaid Other | Admitting: Family Medicine

## 2019-04-17 ENCOUNTER — Encounter: Payer: Self-pay | Admitting: Family Medicine

## 2019-04-17 DIAGNOSIS — Z5900 Homelessness unspecified: Secondary | ICD-10-CM

## 2019-04-17 DIAGNOSIS — M25512 Pain in left shoulder: Secondary | ICD-10-CM

## 2019-04-17 DIAGNOSIS — M542 Cervicalgia: Secondary | ICD-10-CM

## 2019-04-17 DIAGNOSIS — G8929 Other chronic pain: Secondary | ICD-10-CM

## 2019-04-17 DIAGNOSIS — M4722 Other spondylosis with radiculopathy, cervical region: Secondary | ICD-10-CM | POA: Diagnosis not present

## 2019-04-17 DIAGNOSIS — M47812 Spondylosis without myelopathy or radiculopathy, cervical region: Secondary | ICD-10-CM | POA: Insufficient documentation

## 2019-04-17 MED ORDER — HYDROCODONE-ACETAMINOPHEN 5-325 MG PO TABS: 1.0000 | ORAL_TABLET | Freq: Four times a day (QID) | ORAL | 0 refills | Status: DC | PRN

## 2019-04-17 MED ORDER — TIZANIDINE HCL 4 MG PO TABS: 8.0000 mg | ORAL_TABLET | Freq: Three times a day (TID) | ORAL | 0 refills | Status: DC | PRN

## 2019-04-17 MED ORDER — PREDNISONE 50 MG PO TABS: 50.0000 mg | ORAL_TABLET | Freq: Every day | ORAL | 0 refills | Status: DC

## 2019-04-17 NOTE — Assessment & Plan Note (Signed)
In acute exacerbation Will treat with prednisone, week of vicodin and increase tizanidine. Referral to orthopedics made. Recheck 2 weeks. Call with any concerns.

## 2019-04-17 NOTE — Progress Notes (Signed)
There were no vitals taken for this visit.   Subjective:    Patient ID: Johnny [name]., male    DOB: 01-Jun-1971, 48 y.o.   MRN: [id number]  HPI: Johnny [name]. is a 48 y.o. male  Chief Complaint  Patient presents with  . Pain    left shoulder and neck pain, states it started about 2 weeks ago in middle back   Johnny [name] was seen in the ER on 04/01/19 for midline back pain that began on 03/29/19. He had normal labs and CXR, but left the ER before full examination and results were back. He did receive a toradol shot. He states that it didn't do anything at all.  NECK PAIN- known, chronic neck pain. Has had a fusion at C5-6. Last had an x-ray of his neck in 11/2017 which showed some adjacent disc disease and osteophytes  Duration: 3 weeks, sleeping in his [name] again Status: exacerbated Treatments attempted:gabapentin, zanaflex, ibuprofen, tylenol   Compliant with recommended treatment: yes Relief with NSAIDs?:  mild Location:Left Duration:3 weeks Severity: 9/10 Quality: sharp in the AM and evening, dull and aching in the PM Frequency: constant Radiation: L arm Aggravating factors: nothing Alleviating factors: nothing Weakness:  no Paresthesias / decreased sensation:  yes  Fevers:  no  Relevant past medical, surgical, family and social history reviewed and updated as indicated. Interim medical history since our last visit reviewed. Allergies and medications reviewed and updated.  Review of Systems  Constitutional: Negative.   Respiratory: Negative.   Cardiovascular: Negative.   Musculoskeletal: Positive for myalgias, neck pain and neck stiffness. Negative for arthralgias, back pain, gait problem and joint swelling.  Skin: Negative.   Neurological: Positive for weakness and numbness. Negative for dizziness, tremors, seizures, syncope, facial asymmetry, speech difficulty, light-headedness and headaches.  Psychiatric/Behavioral: Negative.     Per HPI unless specifically  indicated above     Objective:    There were no vitals taken for this visit.  Wt Readings from Last 3 Encounters:  04/01/19 190 lb (86.2 kg)  10/09/18 200 lb (90.7 kg)  10/04/18 200 lb 3.2 oz (90.8 kg)    Physical Exam Vitals signs and nursing note reviewed.  Constitutional:      General: He is not in acute distress.    Appearance: Normal appearance. He is not ill-appearing, toxic-appearing or diaphoretic.  HENT:     Head: Normocephalic and atraumatic.     Right Ear: External ear normal.     Left Ear: External ear normal.     Nose: Nose normal.     Mouth/Throat:     Mouth: Mucous membranes are moist.     Pharynx: Oropharynx is clear.  Eyes:     General: No scleral icterus.       Right eye: No discharge.        Left eye: No discharge.     Conjunctiva/sclera: Conjunctivae normal.     Pupils: Pupils are equal, round, and reactive to light.  Neck:     Musculoskeletal: Normal range of motion.  Pulmonary:     Effort: Pulmonary effort is normal. No respiratory distress.     Comments: Speaking in full sentences Musculoskeletal:     Comments: Decreased ROM to neck to side bending and rotation to the R and to extension and flexion  Skin:    Coloration: Skin is not jaundiced or pale.     Findings: No bruising, erythema, lesion or rash.  Neurological:     Mental  Status: He is alert and oriented to person, place, and time. Mental status is at baseline.  Psychiatric:        Mood and Affect: Mood normal.        Behavior: Behavior normal.        Thought Content: Thought content normal.        Judgment: Judgment normal.     Results for orders placed or performed during the hospital encounter of 04/01/19  CBC  Result Value Ref Range   WBC 4.7 4.0 - 10.5 K/uL   RBC 4.93 4.22 - 5.81 MIL/uL   Hemoglobin 15.8 13.0 - 17.0 g/dL   HCT 45.8 39.0 - 52.0 %   MCV 92.9 80.0 - 100.0 fL   MCH 32.0 26.0 - 34.0 pg   MCHC 34.5 30.0 - 36.0 g/dL   RDW 13.1 11.5 - 15.5 %   Platelets 219 150 -  400 K/uL   nRBC 0.0 0.0 - 0.2 %  Troponin I - Once  Result Value Ref Range   Troponin I <0.03 <0.03 ng/mL  Comprehensive metabolic panel  Result Value Ref Range   Sodium 141 135 - 145 mmol/L   Potassium 3.4 (L) 3.5 - 5.1 mmol/L   Chloride 106 98 - 111 mmol/L   CO2 24 22 - 32 mmol/L   Glucose, Bld 133 (H) 70 - 99 mg/dL   BUN 10 6 - 20 mg/dL   Creatinine, Ser 1.00 0.61 - 1.24 mg/dL   Calcium 8.7 (L) 8.9 - 10.3 mg/dL   Total Protein 6.6 6.5 - 8.1 g/dL   Albumin 3.8 3.5 - 5.0 g/dL   AST 21 15 - 41 U/L   ALT 16 0 - 44 U/L   Alkaline Phosphatase 72 38 - 126 U/L   Total Bilirubin 0.7 0.3 - 1.2 mg/dL   GFR calc non Af Amer >60 >60 mL/min   GFR calc Af Amer >60 >60 mL/min   Anion gap 11 5 - 15  Fibrin derivatives D-Dimer ([facility] only)  Result Value Ref Range   Fibrin derivatives D-dimer ([facility]) 289.47 0.00 - 499.00 ng/mL (FEU)  Lipase, blood  Result Value Ref Range   Lipase 30 11 - 51 U/L      Assessment & Plan:   Problem List Items Addressed This Visit      Nervous and Auditory   Osteoarthritis of spine with radiculopathy, cervical region    In acute exacerbation Will treat with prednisone, week of vicodin and increase tizanidine. Referral to orthopedics made. Recheck 2 weeks. Call with any concerns.       Relevant Medications   predniSONE (DELTASONE) 50 MG tablet   HYDROcodone-acetaminophen (NORCO/VICODIN) 5-325 MG tablet   tiZANidine (ZANAFLEX) 4 MG tablet   Other Relevant Orders   Ambulatory referral to Orthopedic Surgery     Other   Chronic neck pain - Primary    In acute exacerbation Will treat with prednisone, week of vicodin and increase tizanidine. Referral to orthopedics made. Recheck 2 weeks. Call with any concerns.       Relevant Medications   predniSONE (DELTASONE) 50 MG tablet   HYDROcodone-acetaminophen (NORCO/VICODIN) 5-325 MG tablet   tiZANidine (ZANAFLEX) 4 MG tablet   Homeless single person    Lost his job and housing due to the covid-19 pandemic.  Currently living in his [name]. Will get chronic care management and social work involved to see what can be done.       Relevant Orders   Referral to Chronic Care  Management Services    Other Visit Diagnoses    Chronic left shoulder pain       In acute exacerbation. Will treat radiculopathy. Call with any concerns.    Relevant Medications   predniSONE (DELTASONE) 50 MG tablet   HYDROcodone-acetaminophen (NORCO/VICODIN) 5-325 MG tablet   tiZANidine (ZANAFLEX) 4 MG tablet       Follow up plan: Return in about 2 weeks (around 05/01/2019) for follow up neck pain.   . This visit was completed via Skype due to the restrictions of the COVID-19 pandemic. All issues as above were discussed and addressed. Physical exam was done as above through visual confirmation on Skype. If it was felt that the patient should be evaluated in the office, they were directed there. The patient verbally consented to this visit. . Location of the patient: parking lot . Location of the provider: home . Those involved with this call:  . Provider: Park Liter, DO . CMA: Yvonna Alanis, [place] . Front Desk/Registration: Don Perking  . Time spent on call: 15 minutes with patient face to face via video conference. More than 50% of this time was spent in counseling and coordination of care. 23 minutes total spent in review of patient's record and preparation of their chart.

## 2019-04-17 NOTE — Assessment & Plan Note (Signed)
Lost his job and housing due to the covid-19 pandemic. Currently living in his [name]. Will get chronic care management and social work involved to see what can be done.

## 2019-04-18 ENCOUNTER — Ambulatory Visit: Payer: Self-pay | Admitting: *Deleted

## 2019-04-18 ENCOUNTER — Ambulatory Visit: Payer: Self-pay | Admitting: Licensed Clinical Social Worker

## 2019-04-18 DIAGNOSIS — Z5900 Homelessness unspecified: Secondary | ICD-10-CM

## 2019-04-18 NOTE — Chronic Care Management (AMB) (Signed)
  Chronic Care Management   Follow Up Note   04/18/2019 Name: Johnny [name]. MRN: [id number] DOB: 21-Oct-1971  Referred by: Valerie Roys, DO Reason for referral : Care Coordination (Food/homelessness)   Johnny [name] Johnny [name]. is a 48 y.o. year old male who is a primary care patient of Valerie Roys, DO. The CCM team was consulted for assistance with chronic disease management and care coordination needs.    Review of patient status, including review of consultants reports, relevant laboratory and other test results, and collaboration with appropriate care team members and the patient's provider was performed as part of comprehensive patient evaluation and provision of chronic care management services.    Goals Addressed            This Visit's Progress   . "I have no food or water" (pt-stated)       Current Barriers:  . Financial constraints . Limited social support . Limited access to food . Housing barriers  Clinical Social Work Clinical Goal(s):  Johnny Kitchen Over the next 90 days, client will work with SW to address concerns related to food insecurity . Over the next 90 days, patient will be educated on available food support resources within the area that will support patient.  Interventions: . Patient interviewed and appropriate assessments performed . Provided patient with information about financial, crisis, food and housing support resources as patient has been living in his car for the past 3 weeks after losing his job. Patient has ran out of income and does not any money to fill up gas tank or food or water.  . Discussed plans with patient for ongoing care management follow up and provided patient with direct contact information for care management team . Advised patient to look out for [facility] who will be meeting patient at gas station to fill up his gas tank in order for patient to go [employer] to get hot meal prepared, additional  food supplies if able, resource bag, and support . Collaborated with RN Case Manager re: who was able find patient financial assistance through [employer].  . Assisted patient/caregiver with obtaining information about health plan benefits . Collaborated with [employer] agencies: [employer], [employer] [place] [employer], and [employer] to find resources for patient.  . Made patient aware of resources via 3 way call with LCSW . Confirmed with [employer] patient successfully provided gas to drive to obtain hot meal from [employer]   Patient Self Care Activities:  . Attends all scheduled provider appointments . Calls provider office for new concerns or questions  Initial goal documentation         The CM team will reach out to the patient again over the next 30 days.  The patient has been provided with contact information for the chronic care management team and has been advised to call with any health related questions or concerns.    Merlene [name] Johnny Chicas RN, BSN Nurse Case Editor, [facility]  [phone]) Business Mobile

## 2019-04-18 NOTE — Patient Instructions (Signed)
Licensed Clinical Social Worker Visit Information  Goals we discussed today:  Goals Addressed    . "I have no food or water" (pt-stated)       Current Barriers:  . Financial constraints . Limited social support . Limited access to food . Housing barriers  Clinical Social Work Clinical Goal(s):  Marland Kitchen Over the next 90 days, client will work with SW to address concerns related to food insecurity . Over the next 90 days, patient will be educated on available food support resources within the area that will support patient.  Interventions: . Patient interviewed and appropriate assessments performed . Provided patient with information about financial, crisis, food and housing support resources as patient has been living in his car for the past 3 weeks after losing his job. Patient has ran out of income and does not any money to fill up gas tank or food or water.  . Discussed plans with patient for ongoing care management follow up and provided patient with direct contact information for care management team . Advised patient to look out for [facility] who will be meeting patient at gas station to fill up his gas tank in order for patient to go [employer] to get hot meal prepared, additional food supplies if able, resource bag, and support . Collaborated with RN Case Manager re: who was able find patient financial assistance through [employer].  . Assisted patient/caregiver with obtaining information about health plan benefits  Patient Self Care Activities:  . Attends all scheduled provider appointments . Calls provider office for new concerns or questions  Initial goal documentation     . "I need safe and stable housing" (pt-stated)       Current Barriers:  . Financial constraints . Limited social support . Limited access to food . Housing barriers . Limited education about housing resources available during COVID-19*  Clinical Social Work  Clinical Goal(s):  Marland Kitchen Over the next 90 days, client will work with SW to address concerns related to lack of stable housing. . Over the next 90 days, client will follow up with crisis support resources * as directed by SW  Interventions: . Patient interviewed and appropriate assessments performed . Provided patient with information about housing shelters/resources available during COVID- Patient prefers not to go to a shelter/boarding house at this time. . Advised patient to contact LCSW directly if any urgent needs present . Collaborated with RN Case Manager re: housing/food insecurity . Assisted patient/caregiver with obtaining information about health plan benefits . LCSW completed several calls to various community resources/shelters/churches and left messages asking for any financial assistance/housing support that they could provide during COVID-19.   Patient Self Care Activities:  . Attends all scheduled provider appointments . Calls provider office for new concerns or questions  Initial goal documentation     Materials provided: Yes: crisis support resources  Johnny [name] was given information about Chronic Care Management services today including:  1. CCM service includes personalized support from designated clinical staff supervised by his physician, including individualized plan of care and coordination with other care providers 2. 24/7 contact phone numbers for assistance for urgent and routine care needs. 3. Service will only be billed when office clinical staff spend 20 minutes or more in a month to coordinate care. 4. Only one practitioner may furnish and bill the service in a calendar month. 5. The patient may stop CCM services at any time (effective at the end of the month) by  phone call to the office staff. 6. The patient will be responsible for cost sharing (co-pay) of up to 20% of the service fee (after annual deductible is met).  Patient agreed to services and verbal  consent obtained.   The patient verbalized understanding of instructions provided today and declined a print copy of patient instruction materials.    Follow up plan: SW will follow up with patient by phone over the next one week  Johnny [name], [place], [name], [email or web address] Phone: [phone]

## 2019-04-18 NOTE — Progress Notes (Addendum)
Error

## 2019-04-18 NOTE — Patient Instructions (Signed)
Thank you allowing the Chronic Care Management Team to be a part of your care! It was a pleasure speaking with you today!   CCM (Chronic Care Management) Team   Geronimo Diliberto RN, BSN Nurse Care Coordinator  [phone]  Catie Resnick [facility] PharmD  Clinical Pharmacist  [phone]  [name] LCSW Clinical Social Worker [phone]  Goals Addressed            This Visit's Progress   . "I have no food or water" (pt-stated)       Current Barriers:  . Financial constraints . Limited social support . Limited access to food . Housing barriers  Clinical Social Work Clinical Goal(s):  Marland Kitchen Over the next 90 days, client will work with SW to address concerns related to food insecurity . Over the next 90 days, patient will be educated on available food support resources within the area that will support patient.  Interventions: . Patient interviewed and appropriate assessments performed . Provided patient with information about financial, crisis, food and housing support resources as patient has been living in his car for the past 3 weeks after losing his job. Patient has ran out of income and does not any money to fill up gas tank or food or water.  . Discussed plans with patient for ongoing care management follow up and provided patient with direct contact information for care management team . Advised patient to look out for [facility] who will be meeting patient at gas station to fill up his gas tank in order for patient to go [employer] to get hot meal prepared, additional food supplies if able, resource bag, and support . Collaborated with RN Case Manager re: who was able find patient financial assistance through [employer].  . Assisted patient/caregiver with obtaining information about health plan benefits . Collaborated with [employer] agencies: [employer], [employer] [place] [employer], and [employer] to find resources for patient.  . Made patient aware of resources via 3 way call with LCSW . Confirmed with [employer] patient successfully provided gas to drive to obtain hot meal from [employer]   Patient Self Care Activities:  . Attends all scheduled provider appointments . Calls provider office for new concerns or questions  Initial goal documentation        The patient verbalized understanding of instructions provided today and declined a print copy of patient instruction materials.   The CM team will reach out to the patient again over the next 14 days.  The patient has been provided with contact information for the chronic care management team and has been advised to call with any health related questions or concerns.

## 2019-04-18 NOTE — Chronic Care Management (AMB) (Addendum)
Chronic Care Management    Clinical Social Work General Note  04/18/2019 Name: Johnny [name]. MRN: [id number] DOB: 05/22/1971  Johnny [name]. is a 48 y.o. year old male who is a primary care patient of Valerie Roys, DO. The CCM was consulted to assist the patient with Food Insecurity.   Mr. Stueve was given information about Chronic Care Management services today including:  1. CCM service includes personalized support from designated clinical staff supervised by his physician, including individualized plan of care and coordination with other care providers 2. 24/7 contact phone numbers for assistance for urgent and routine care needs. 3. Service will only be billed when office clinical staff spend 20 minutes or more in a month to coordinate care. 4. Only one practitioner may furnish and bill the service in a calendar month. 5. The patient may stop CCM services at any time (effective at the end of the month) by phone call to the office staff. 6. The patient will be responsible for cost sharing (co-pay) of up to 20% of the service fee (after annual deductible is met).  Patient agreed to services and verbal consent obtained.   Review of patient status, including review of consultants reports, relevant laboratory and other test results, and collaboration with appropriate care team members and the patient's provider was performed as part of comprehensive patient evaluation and provision of chronic care management services.    SDOH (Social Determinants of Health) screening performed today. See Care Plan Entry related to challenges with: Housing   Goals Addressed    . "I have no food or water" (pt-stated)       Current Barriers:  . Financial constraints . Limited social support . Limited access to food . Housing barriers  Clinical Social Work Clinical Goal(s):  Marland Kitchen Over the next 90 days, client will work with SW to address concerns related to food insecurity . Over the  next 90 days, patient will be educated on available food support resources within the area that will support patient.  Interventions: . Patient interviewed and appropriate assessments performed . Provided patient with information about financial, crisis, food and housing support resources as patient has been living in his car for the past 3 weeks after losing his job. Patient has ran out of income and does not any money to fill up gas tank or food or water.  . Discussed plans with patient for ongoing care management follow up and provided patient with direct contact information for care management team . Advised patient to look out for [facility] who will be meeting patient at gas station to fill up his gas tank in order for patient to go [employer] to get hot meal prepared, additional food supplies if able, resource bag, and support . Collaborated with RN Case Manager re: who was able find patient financial assistance through [employer].  . Assisted patient/caregiver with obtaining information about health plan benefits  Patient Self Care Activities:  . Attends all scheduled provider appointments . Calls provider office for new concerns or questions  Initial goal documentation     . "I need safe and stable housing" (pt-stated)       Current Barriers:  . Financial constraints . Limited social support . Limited access to food . Housing barriers . Limited education about housing resources available during COVID-19*  Clinical Social Work Clinical Goal(s):  Marland Kitchen Over the next 90 days, client will work with SW to address concerns  related to lack of stable housing. . Over the next 90 days, client will follow up with crisis support resources * as directed by SW  Interventions: . Patient interviewed and appropriate assessments performed . Provided patient with information about housing shelters/resources available during COVID- Patient prefers not to  go to a shelter/boarding house at this time. . Advised patient to contact LCSW directly if any urgent needs present . Collaborated with RN Case Manager re: housing/food insecurity . Assisted patient/caregiver with obtaining information about health plan benefits . LCSW completed several calls to various community resources/shelters/churches and left messages asking for any financial assistance/housing support that they could provide during COVID-19.   Patient Self Care Activities:  . Attends all scheduled provider appointments . Calls provider office for new concerns or questions  Initial goal documentation        Follow Up Plan: SW will follow up with patient by phone on 04/22/2019      [name], [place], [name], [email or web address] Phone: [phone]

## 2019-04-21 ENCOUNTER — Ambulatory Visit: Payer: Medicaid Other | Admitting: Licensed Clinical Social Worker

## 2019-04-21 ENCOUNTER — Other Ambulatory Visit: Payer: Self-pay

## 2019-04-21 DIAGNOSIS — Z5900 Homelessness unspecified: Secondary | ICD-10-CM

## 2019-04-21 NOTE — Patient Instructions (Signed)
Licensed Clinical Social Worker Visit Information  Goals we discussed today:  Goals Addressed    . "I have no food or water" (pt-stated)       Current Barriers:  . Financial constraints . Limited social support . Limited access to food . Housing barriers  Clinical Social Work Clinical Goal(s):  Marland Kitchen Over the next 90 days, client will work with SW to address concerns related to food insecurity . Over the next 90 days, patient will be educated on available food support resources within the area that will support patient.  Interventions: . Patient interviewed and appropriate assessments performed . Provided patient with information about financial, crisis, food and housing support resources as patient has been living in his car for the past 3 weeks after losing his job. Patient has ran out of income and does not any money to fill up gas tank or food or water.  . Discussed plans with patient for ongoing care management follow up and provided patient with direct contact information for care management team . Confirmed that patient met [facility] who filled up his gas tank in order for patient to go [employer] to get hot meals prepared, additional food supplies if able, resource bag, and support . Collaborated with RN Case Manager re: who was able find patient financial assistance through [employer].  . Assisted patient/caregiver with obtaining information about health plan benefits . Collaborated with [employer] agencies: [employer], [employer] [place] [employer] for [employer], and [employer] to find resources for patient.  Marland Kitchen LCSW sent text message to patient with calendar of resources in terms of where to gain hot meals throughout the week so that patient can have this information nearby at all times to refer back to.   Patient Self Care Activities:  . Attends all scheduled provider appointments . Calls provider office for  new concerns or questions  Please see past updates related to this goal by clicking on the "Past Updates" button in the selected goal      . "I need safe and stable housing" (pt-stated)       Current Barriers:  . Financial constraints . Limited social support . Limited access to food . Housing barriers . Limited education about housing resources available during COVID-19*  Clinical Social Work Clinical Goal(s):  Marland Kitchen Over the next 90 days, client will work with SW to address concerns related to lack of stable housing. . Over the next 90 days, client will follow up with crisis support resources * as directed by SW  Interventions: . Patient interviewed and appropriate assessments performed . LCSW sent text message to patient with a list of low income housing resources for him to refer back to while seeking stable housing . Provided patient with information about housing shelters/resources available during COVID- Patient prefers not to go to a shelter/boarding house at this time. . Advised patient to contact LCSW directly if any urgent needs present . Assisted patient/caregiver with obtaining information about health plan benefits . LCSW completed several calls to various community resources/shelters/churches and left messages asking for any financial assistance/housing support that they could provide during COVID-19.   Patient Self Care Activities:  . Attends all scheduled provider appointments . Calls provider office for new concerns or questions  Please see past updates related to this goal by clicking on the "Past Updates" button in the selected goal     Materials Provided: Yes: crisis support resources  Follow Up Plan:  SW will follow up with patient by phone over the next 7 days  [name], [place], [name], [email or web address] Phone: [phone]

## 2019-04-21 NOTE — Chronic Care Management (AMB) (Signed)
Care Management Note   Johnny [name]. is a 48 y.o. year old male who is a primary care patient of Johnny Roys, DO. The CM team was consulted for assistance with chronic disease management and care coordination.   I reached out to [employer]. by phone today.   Review of patient status, including review of consultants reports, relevant laboratory and other test results, and collaboration with appropriate care team members and the patient's provider was performed as part of comprehensive patient evaluation and provision of chronic care management services.   Goals Addressed    . "I have no food or water" (pt-stated)       Current Barriers:  . Financial constraints . Limited social support . Limited access to food . Housing barriers  Clinical Social Work Clinical Goal(s):  Marland Kitchen Over the next 90 days, client will work with SW to address concerns related to food insecurity . Over the next 90 days, patient will be educated on available food support resources within the area that will support patient.  Interventions: . Patient interviewed and appropriate assessments performed . Provided patient with information about financial, crisis, food and housing support resources as patient has been living in his car for the past 3 weeks after losing his job. Patient has ran out of income and does not any money to fill up gas tank or food or water.  . Discussed plans with patient for ongoing care management follow up and provided patient with direct contact information for care management team . Confirmed that patient met [facility] who filled up his gas tank in order for patient to go [employer] to get hot meals prepared, additional food supplies if able, resource bag, and support . Assisted patient/caregiver with obtaining information about health plan benefits . Collaborated with [employer] agencies: [employer], [employer] [place] [employer], and [employer] to find resources for patient.  Marland Kitchen LCSW sent text message to patient with calendar of resources in terms of where to gain hot meals throughout the week so that patient can have this information nearby at all times to refer back to.   Patient Self Care Activities:  . Attends all scheduled provider appointments . Calls provider office for new concerns or questions  Please see past updates related to this goal by clicking on the "Past Updates" button in the selected goal    . "I need safe and stable housing" (pt-stated)       Current Barriers:  . Financial constraints . Limited social support . Limited access to food . Housing barriers . Limited education about housing resources available during COVID-19*  Clinical Social Work Clinical Goal(s):  Marland Kitchen Over the next 90 days, client will work with SW to address concerns related to lack of stable housing. . Over the next 90 days, client will follow up with crisis support resources * as directed by SW  Interventions: . Patient interviewed and appropriate assessments performed . LCSW sent text message to patient with a list of low income housing resources for him to refer back to while seeking stable housing . Provided patient with information about housing shelters/resources available during COVID- Patient prefers not to go to a shelter/boarding house at this time. . Advised patient to contact LCSW directly if any urgent needs present . Assisted patient/caregiver with obtaining information about health plan benefits . LCSW completed several calls to various community resources/shelters/churches and left messages asking for  any financial assistance/housing support that they could provide during COVID-19.   Patient Self Care Activities:  . Attends all scheduled provider appointments . Calls provider office for new concerns or questions  Please see past updates related to this goal by clicking on  the "Past Updates" button in the selected goal    Follow Up Plan: The CM team will reach out to the patient again over the next 7 days.   Johnny [name], BSW, [name], [facility] [email or web address] Phone: [phone]

## 2019-04-22 ENCOUNTER — Ambulatory Visit: Payer: Medicaid Other | Admitting: Pharmacist

## 2019-04-22 DIAGNOSIS — M25512 Pain in left shoulder: Secondary | ICD-10-CM

## 2019-04-22 DIAGNOSIS — M4722 Other spondylosis with radiculopathy, cervical region: Secondary | ICD-10-CM

## 2019-04-22 DIAGNOSIS — M542 Cervicalgia: Principal | ICD-10-CM

## 2019-04-22 DIAGNOSIS — G8929 Other chronic pain: Secondary | ICD-10-CM

## 2019-04-22 NOTE — Patient Instructions (Signed)
Visit Information  Goals Addressed            This Visit's Progress     Patient Stated   . "I'm worried about my pain" (pt-stated)       Current Barriers:  . Concern regarding back pain, currently treated with gabapentin 600 mg BID, hydrocodone/APAP 5/325 mg Q6H, and tizanidine 8 mg Q8H o Patient confirms that he has his medications; denies concerns affording them due to having active [place] Medicaid insurance  . Referral placed for orthopedic consult by PCP Dr. Wynetta Emery at visit last week  Pharmacist Clinical Goal(s):  Marland Kitchen Over the next 60 days, patient will work with CCM team to address needs related to optimized medication management  Interventions: . Comprehensive medication review performed.  . Collaboration with Dr. Durenda Age CMA to follow up on status of orthopedic referral- she notes that it has been authorized, so patient can contact [facility] directly to schedule an appointment. Communicated this information to the patient, he expressed understanding and appreciation.  Patient Self Care Activities:  . Self administers medications as prescribed . Calls pharmacy for medication refills . Calls provider office for new concerns or questions  Initial goal documentation        The patient verbalized understanding of instructions provided today and declined a print copy of patient instruction materials.   Plan:  - CCM team will outreach patient within the next 14 days to continue to support and provide resources  Catie Darnelle Maffucci, PharmD Clinical Pharmacist [place] [phone]

## 2019-04-22 NOTE — Chronic Care Management (AMB) (Signed)
  Chronic Care Management   Follow Up Note   04/22/2019 Name: Johnny [name]. MRN: [id number] DOB: 15-Nov-1971  Referred by: Valerie Roys, DO Reason for referral : Chronic Care Management (Medication Review)   Johnny [name]. is a 48 y.o. year old male who is a primary care patient of Valerie Roys, DO. The CCM team was consulted for assistance with chronic disease management and care coordination needs.    Contacted patient telephonically today for medication review, and to evaluate for medication access concerns.   Review of patient status, including review of consultants reports, relevant laboratory and other test results, and collaboration with appropriate care team members and the patient's provider was performed as part of comprehensive patient evaluation and provision of chronic care management services.    Goals Addressed            This Visit's Progress     Patient Stated   . "I'm worried about my pain" (pt-stated)       Current Barriers:  . Concern regarding back pain, currently treated with gabapentin 600 mg BID, hydrocodone/APAP 5/325 mg Q6H, and tizanidine 8 mg Q8H o Patient confirms that he has his medications; denies concerns affording them due to having active [place] Medicaid insurance  . Referral placed for orthopedic consult by PCP Dr. Wynetta Emery at visit last week  Pharmacist Clinical Goal(s):  Marland Kitchen Over the next 60 days, patient will work with CCM team to address needs related to optimized medication management  Interventions: . Comprehensive medication review performed.  . Collaboration with Dr. Durenda Age CMA to follow up on status of orthopedic referral- she notes that it has been authorized, so patient can contact [facility] directly to schedule an appointment. Communicated this information to the patient, he expressed understanding and appreciation.  Patient Self Care Activities:  . Self administers medications as prescribed .  Calls pharmacy for medication refills . Calls provider office for new concerns or questions  Initial goal documentation         Plan:  - CCM team will outreach patient within the next 14 days to continue to support and provide resources  Catie Darnelle Maffucci, PharmD Clinical Pharmacist [place] [phone]

## 2019-04-29 ENCOUNTER — Telehealth: Payer: Self-pay | Admitting: Family Medicine

## 2019-04-29 ENCOUNTER — Encounter: Payer: Self-pay | Admitting: Family Medicine

## 2019-04-29 NOTE — Telephone Encounter (Signed)
Noted  

## 2019-04-29 NOTE — Telephone Encounter (Signed)
This refill is not appropriate. Will not refill. If he is not feeling better, he is to call his neurosurgeon back for trigger point injections.

## 2019-04-29 NOTE — Telephone Encounter (Signed)
Called and spoke to patient. Patient stated that he went to see his neurosurgeon this morning and he was told that he needs to ask Dr. Wynetta Emery for this Rx refill as they are not allowed to do so. Please advise

## 2019-04-29 NOTE — Telephone Encounter (Unsigned)
Copied from [place] [id number]. Topic: Quick Communication - Rx Refill/Question >> Apr 29, 2019 10:16 AM Yvette Rack wrote: Medication: HYDROcodone-acetaminophen (NORCO/VICODIN) 5-325 MG tablet  Has the patient contacted their pharmacy? no  Preferred Pharmacy (with phone number or street name): [id number] [place], [address] [phone] (Phone) [phone] (Fax)  Agent: Please be advised that RX refills may take up to 3 business days. We ask that you follow-up with your pharmacy.

## 2019-04-29 NOTE — Telephone Encounter (Signed)
He is aware that we do not do long term treatment of chronic pain. He will need to be seen for any refills.

## 2019-04-29 NOTE — Telephone Encounter (Signed)
Patient already had an appointment scheduled for Friday but he stated he couldn't wait that long.  He stated that he's in a lot of pain and can't lift his legs. Has neck/back and chest pain. Also stated they neurosurgeon stated he needed surgery.  Patient's appointment changed to tomorrow at 10:30 for a skype visit.  Routing to provider as Juluis Rainier

## 2019-04-30 ENCOUNTER — Encounter: Payer: Self-pay | Admitting: Family Medicine

## 2019-04-30 ENCOUNTER — Other Ambulatory Visit: Payer: Self-pay

## 2019-04-30 ENCOUNTER — Ambulatory Visit: Payer: Self-pay | Admitting: Pharmacist

## 2019-04-30 ENCOUNTER — Ambulatory Visit (INDEPENDENT_AMBULATORY_CARE_PROVIDER_SITE_OTHER): Payer: Medicaid Other | Admitting: Family Medicine

## 2019-04-30 ENCOUNTER — Other Ambulatory Visit (HOSPITAL_COMMUNITY): Payer: Self-pay | Admitting: Student

## 2019-04-30 ENCOUNTER — Telehealth: Payer: Self-pay

## 2019-04-30 ENCOUNTER — Other Ambulatory Visit: Payer: Self-pay | Admitting: Student

## 2019-04-30 VITALS — Ht 72.0 in | Wt 182.0 lb

## 2019-04-30 DIAGNOSIS — M4722 Other spondylosis with radiculopathy, cervical region: Secondary | ICD-10-CM | POA: Diagnosis not present

## 2019-04-30 DIAGNOSIS — G894 Chronic pain syndrome: Secondary | ICD-10-CM | POA: Diagnosis not present

## 2019-04-30 DIAGNOSIS — G8929 Other chronic pain: Secondary | ICD-10-CM

## 2019-04-30 DIAGNOSIS — M5412 Radiculopathy, cervical region: Secondary | ICD-10-CM

## 2019-04-30 DIAGNOSIS — M542 Cervicalgia: Secondary | ICD-10-CM

## 2019-04-30 MED ORDER — GABAPENTIN 800 MG PO TABS: 800.0000 mg | ORAL_TABLET | Freq: Three times a day (TID) | ORAL | 1 refills | Status: DC

## 2019-04-30 MED ORDER — HYDROCODONE-ACETAMINOPHEN 5-325 MG PO TABS: 1.0000 | ORAL_TABLET | Freq: Four times a day (QID) | ORAL | 0 refills | Status: DC | PRN

## 2019-04-30 NOTE — Chronic Care Management (AMB) (Signed)
  Chronic Care Management   Follow Up Note   04/30/2019 Name: Johnny [name]. MRN: [id number] DOB: 12/27/1970  Referred by: Valerie Roys, DO Reason for referral : Chronic Care Management (Medication Management)   [name] Octave [place]. is a 48 y.o. year old male who is a primary care patient of Valerie Roys, DO. The CCM team was consulted for assistance with chronic disease management and care coordination needs.    Received message that patient had contacted clinic to speak with me regarding medication access concerns.   Review of patient status, including review of consultants reports, relevant laboratory and other test results, and collaboration with appropriate care team members and the patient's provider was performed as part of comprehensive patient evaluation and provision of chronic care management services.    Goals Addressed            This Visit's Progress     Patient Stated   . "I'm worried about my pain" (pt-stated)       Current Barriers:  . Concern regarding back pain; established with neurosurgery and pain management referral placed today . Patient contacted me with concerns of being unable to fill the new prescription for hydrocodone-APAP under [place] Medicaid; noted that cash price is $26  Pharmacist Clinical Goal(s):  Marland Kitchen Over the next 60 days, patient will work with CCM team to address needs related to optimized medication management  Interventions: . [facility] Pharmacist noted that since duration of therapy >7 days, PA is required by [facility] Medicaid. Notified [facility] team that this was needed . Explained this step to patient, he verbalized understanding. He noted that he is in significant need of the medication, so he is trying to come up with the $26 dollars today to pick up the medication, as unsure of how long PA process takes  Patient Self Care Activities:  . Self administers medications as prescribed . Calls pharmacy for medication  refills . Calls provider office for new concerns or questions  Please see past updates related to this goal by clicking on the "Past Updates" button in the selected goal         Plan:  - Will notify Dr. Wynetta Emery of the above information. Will continue to remain a resource for patient for medication access problems.   Catie Darnelle Maffucci, PharmD Clinical Pharmacist [place] [phone]

## 2019-04-30 NOTE — Progress Notes (Signed)
Ht 6' (1.829 m)   Wt 182 lb (82.6 kg)   BMI 24.68 kg/m    Subjective:    Patient ID: Johnny [name]., male    DOB: 1971-06-03, 48 y.o.   MRN: [id number]  HPI: Johnny [name]. is a 48 y.o. male  Chief Complaint  Patient presents with  . Back Pain    Saw Neurosurgery. Requesting refill for Hydrocodone.  . Neck Pain   Noel saw neurosurgery yesterday, they changed his tizanidine to methocarbamol as he'd been on it for an extended period of time. They are getting him set up for an MRI of his neck and advised him that if he is not getting better, he can return for trigger point injections. He notes that he is feeling worse since his last visit. At that point, it was just his neck that was hurting. Now his lower back is also hurting.  He notes that it is in the middle of his back right below his shoulder blades. He notes that he was having pain in his chest when he was lifting his legs. He notes that he is in sharp pain with any movement of his head between his shoulder blades. Prednisone didn't help at all. Pain medicine seemed to help a bit. He notes that the muscle relaxor didn't help at all. Notes that the robaxin seems like it has been helping a bit. He is very frustrated with the pain and not feeling well at all. He is otherwise hanging in there with no other concerns or complaints at this time.   Relevant past medical, surgical, family and social history reviewed and updated as indicated. Interim medical history since our last visit reviewed. Allergies and medications reviewed and updated.  Review of Systems  Constitutional: Negative.   Respiratory: Negative.   Cardiovascular: Negative.   Gastrointestinal: Negative.   Musculoskeletal: Positive for back pain, myalgias, neck pain and neck stiffness. Negative for arthralgias, gait problem and joint swelling.  Skin: Negative.   Neurological: Positive for weakness and numbness. Negative for dizziness, tremors, seizures,  syncope, facial asymmetry, speech difficulty, light-headedness and headaches.  Psychiatric/Behavioral: Negative.     Per HPI unless specifically indicated above     Objective:    Ht 6' (1.829 m)   Wt 182 lb (82.6 kg)   BMI 24.68 kg/m   Wt Readings from Last 3 Encounters:  04/30/19 182 lb (82.6 kg)  04/01/19 190 lb (86.2 kg)  10/09/18 200 lb (90.7 kg)    Physical Exam Vitals signs and nursing note reviewed.  Constitutional:      General: He is not in acute distress.    Appearance: Normal appearance. He is not ill-appearing, toxic-appearing or diaphoretic.  HENT:     Head: Normocephalic and atraumatic.     Right Ear: External ear normal.     Left Ear: External ear normal.     Nose: Nose normal.     Mouth/Throat:     Mouth: Mucous membranes are moist.     Pharynx: Oropharynx is clear.  Eyes:     General: No scleral icterus.       Right eye: No discharge.        Left eye: No discharge.     Conjunctiva/sclera: Conjunctivae normal.     Pupils: Pupils are equal, round, and reactive to light.  Neck:     Musculoskeletal: Normal range of motion.  Pulmonary:     Effort: Pulmonary effort is normal. No respiratory distress.  Comments: Speaking in full sentences Musculoskeletal: Normal range of motion.  Skin:    Coloration: Skin is not jaundiced or pale.     Findings: No bruising, erythema, lesion or rash.  Neurological:     Mental Status: He is alert and oriented to person, place, and time. Mental status is at baseline.  Psychiatric:        Mood and Affect: Mood normal.        Behavior: Behavior normal.        Thought Content: Thought content normal.        Judgment: Judgment normal.     Results for orders placed or performed during the hospital encounter of 04/01/19  CBC  Result Value Ref Range   WBC 4.7 4.0 - 10.5 K/uL   RBC 4.93 4.22 - 5.81 MIL/uL   Hemoglobin 15.8 13.0 - 17.0 g/dL   HCT 45.8 39.0 - 52.0 %   MCV 92.9 80.0 - 100.0 fL   MCH 32.0 26.0 - 34.0 pg    MCHC 34.5 30.0 - 36.0 g/dL   RDW 13.1 11.5 - 15.5 %   Platelets 219 150 - 400 K/uL   nRBC 0.0 0.0 - 0.2 %  Troponin I - Once  Result Value Ref Range   Troponin I <0.03 <0.03 ng/mL  Comprehensive metabolic panel  Result Value Ref Range   Sodium 141 135 - 145 mmol/L   Potassium 3.4 (L) 3.5 - 5.1 mmol/L   Chloride 106 98 - 111 mmol/L   CO2 24 22 - 32 mmol/L   Glucose, Bld 133 (H) 70 - 99 mg/dL   BUN 10 6 - 20 mg/dL   Creatinine, Ser 1.00 0.61 - 1.24 mg/dL   Calcium 8.7 (L) 8.9 - 10.3 mg/dL   Total Protein 6.6 6.5 - 8.1 g/dL   Albumin 3.8 3.5 - 5.0 g/dL   AST 21 15 - 41 U/L   ALT 16 0 - 44 U/L   Alkaline Phosphatase 72 38 - 126 U/L   Total Bilirubin 0.7 0.3 - 1.2 mg/dL   GFR calc non Af Amer >60 >60 mL/min   GFR calc Af Amer >60 >60 mL/min   Anion gap 11 5 - 15  Fibrin derivatives D-Dimer ([facility] only)  Result Value Ref Range   Fibrin derivatives D-dimer ([facility]) 289.47 0.00 - 499.00 ng/mL (FEU)  Lipase, blood  Result Value Ref Range   Lipase 30 11 - 51 U/L      Assessment & Plan:   Problem List Items Addressed This Visit      Nervous and Auditory   Osteoarthritis of spine with radiculopathy, cervical region    Not doing well. Following with neurosurgery. Awaiting MRI. Will increase his gabapentin to 800mg  TID and recheck 2 weeks. Refill of his vicodin ordered today. Call with any concerns. Referral to pain management made today.      Relevant Medications   methocarbamol (ROBAXIN) 500 MG tablet   gabapentin (NEURONTIN) 800 MG tablet   HYDROcodone-acetaminophen (NORCO/VICODIN) 5-325 MG tablet     Other   Chronic pain syndrome    Discussed with patient hat we don't do long term pain medication in this office. Given his several issues, we will see about getting him into pain management. Call with any concerns. Continue to monitor. Referral generated today.      Relevant Medications   methocarbamol (ROBAXIN) 500 MG tablet   gabapentin (NEURONTIN) 800 MG tablet    HYDROcodone-acetaminophen (NORCO/VICODIN) 5-325 MG tablet   Other Relevant  Orders   Ambulatory referral to Pain Clinic   Chronic neck pain - Primary   Relevant Medications   methocarbamol (ROBAXIN) 500 MG tablet   gabapentin (NEURONTIN) 800 MG tablet   HYDROcodone-acetaminophen (NORCO/VICODIN) 5-325 MG tablet   Other Relevant Orders   Ambulatory referral to Pain Clinic       Follow up plan: Return in about 2 weeks (around 05/14/2019) for follow up pain.   . This visit was completed via Skype due to the restrictions of the COVID-19 pandemic. All issues as above were discussed and addressed. Physical exam was done as above through visual confirmation on Skype. If it was felt that the patient should be evaluated in the office, they were directed there. The patient verbally consented to this visit. . Location of the patient: parking lot . Location of the provider: home . Those involved with this call:  . Provider: Park Liter, DO . CMA: Merilyn Baba, CMA . Front Desk/Registration: Don Perking  . Time spent on call: 15 minutes with patient face to face via video conference. More than 50% of this time was spent in counseling and coordination of care. 23 minutes total spent in review of patient's record and preparation of their chart.

## 2019-04-30 NOTE — Assessment & Plan Note (Signed)
Discussed with patient hat we don't do long term pain medication in this office. Given his several issues, we will see about getting him into pain management. Call with any concerns. Continue to monitor. Referral generated today.

## 2019-04-30 NOTE — Telephone Encounter (Signed)
Prior Authorization initiated via [employer] for Hydrocodone/act Confirmation #: [id number]

## 2019-04-30 NOTE — Assessment & Plan Note (Signed)
Not doing well. Following with neurosurgery. Awaiting MRI. Will increase his gabapentin to 800mg  TID and recheck 2 weeks. Refill of his vicodin ordered today. Call with any concerns. Referral to pain management made today.

## 2019-04-30 NOTE — Patient Instructions (Signed)
Visit Information  Goals Addressed            This Visit's Progress     Patient Stated   . "I'm worried about my pain" (pt-stated)       Current Barriers:  . Concern regarding back pain; established with neurosurgery and pain management referral placed today . Patient contacted me with concerns of being unable to fill the new prescription for hydrocodone-APAP under [place] [employer]; noted that cash price is $26  Pharmacist Clinical Goal(s):  Marland Kitchen Over the next 60 days, patient will work with CCM team to address needs related to optimized medication management  Interventions: . Contacted [facility]. Pharmacist noted that since duration of therapy >7 days, PA is required by [facility] Medicaid. Notified clinic [place] team that this was needed . Explained this step to patient, he verbalized understanding. He noted that he is in significant need of the medication, so he is trying to come up with the $26 dollars today to pick up the medication, as unsure of how long PA process takes  Patient Self Care Activities:  . Self administers medications as prescribed . Calls pharmacy for medication refills . Calls provider office for new concerns or questions  Please see past updates related to this goal by clicking on the "Past Updates" button in the selected goal         The patient verbalized understanding of instructions provided today and declined a print copy of patient instruction materials.   Plan:  - Will notify Dr. Wynetta Emery of the above information. Will continue to remain a resource for patient for medication access problems.   Catie Darnelle Maffucci, PharmD Clinical Pharmacist [place] [phone]

## 2019-05-01 ENCOUNTER — Telehealth: Payer: Medicaid Other

## 2019-05-01 ENCOUNTER — Ambulatory Visit: Payer: Self-pay | Admitting: *Deleted

## 2019-05-01 NOTE — Chronic Care Management (AMB) (Signed)
  Chronic Care Management   Outreach Note  05/01/2019 Name: Johnny [name]. MRN: [id number] DOB: 03-Aug-1971  Referred by: Valerie Roys, DO Reason for referral : Chronic Care Management (homelessness/Back pain)   An unsuccessful telephone outreach was attempted today. The patient was referred to the case management team by for assistance with chronic care management and care coordination.   Follow Up Plan: A HIPPA compliant phone message was left for the patient providing contact information and requesting a return call.   Merlene Morse Kaysie Michelini RN, BSN Nurse Case Editor, commissioning Family Practice/[facility] Care Management  [phone]) Business Mobile

## 2019-05-02 ENCOUNTER — Ambulatory Visit: Payer: Medicaid Other | Admitting: Family Medicine

## 2019-05-06 ENCOUNTER — Ambulatory Visit: Admission: RE | Admit: 2019-05-06 | Payer: Medicaid Other | Source: Ambulatory Visit

## 2019-05-07 ENCOUNTER — Telehealth: Payer: Self-pay

## 2019-05-07 ENCOUNTER — Ambulatory Visit: Payer: Self-pay | Admitting: *Deleted

## 2019-05-07 DIAGNOSIS — Z5900 Homelessness unspecified: Secondary | ICD-10-CM

## 2019-05-07 DIAGNOSIS — G894 Chronic pain syndrome: Secondary | ICD-10-CM

## 2019-05-07 NOTE — Chronic Care Management (AMB) (Signed)
  Chronic Care Management   Outreach Note  05/07/2019 Name: Johnny [name]. MRN: [id number] DOB: 1971-01-29  Referred by: Valerie Roys, DO Reason for referral : Chronic Care Management (Back Pain/Homelessness)   A second unsuccessful telephone outreach was attempted today. The patient was referred to the case management team for assistance with chronic care management and care coordination.   Follow Up Plan: The CM team will reach out to the patient again over the next 14 days.  Merlene Morse Aerion Bagdasarian RN, BSN Nurse Case Editor, commissioning Family Practice/[facility] Care Management  [phone]) Business Mobile

## 2019-05-08 NOTE — Telephone Encounter (Signed)
PA Approved

## 2019-05-09 ENCOUNTER — Ambulatory Visit: Payer: Self-pay | Admitting: Licensed Clinical Social Worker

## 2019-05-09 ENCOUNTER — Ambulatory Visit: Payer: Self-pay | Admitting: *Deleted

## 2019-05-09 DIAGNOSIS — Z5900 Homelessness unspecified: Secondary | ICD-10-CM

## 2019-05-09 NOTE — Patient Instructions (Signed)
Thank you allowing the Chronic Care Management Team to be a part of your care! It was a pleasure speaking with you today!  CCM (Chronic Care Management) Team   Salam [place]field RN, BSN Nurse Care Coordinator  [phone]  Catie [facility] PharmD  Clinical Pharmacist  [phone]  [name] LCSW Clinical Social Worker [phone]  Goals Addressed            This Visit's Progress   . Homeless with daughter  (pt-stated)       Current Barriers:  Marland Kitchen Knowledge Deficits related to on where to find appropriate shelter for he and his daughter . Film/video editor.  . Transportation barriers  Nurse Case Manager Clinical Goal(s):  Marland Kitchen Over the next 30 days, patient will work with CM clinical social worker to find appropriate housing/shelter for patient and his daughter  Interventions:  . Collaborated with LCSW ,Care Guide, [employer] regarding assistance with shelter options for patient . Discussed plans with patient for ongoing care management follow up and provided patient with direct contact information for care management team . Care Guide referral for Homelessness . Social Work referral for homelessness  Patient Self Care Activities:  . Currently UNABLE TO independently find appropriate housing options for himself and his daughter  Initial goal documentation         The patient verbalized understanding of instructions provided today and declined a print copy of patient instruction materials.   The CM team will reach out to the patient again over the next 7 days.  The patient has been provided with contact information for the chronic care management team and has been advised to call with any health related questions or concerns.

## 2019-05-09 NOTE — Chronic Care Management (AMB) (Signed)
  Chronic Care Management   Follow Up Note   05/09/2019 Name: Johnny [name]. MRN: [id number] DOB: 09-02-1971  Referred by: Valerie Roys, DO Reason for referral : Chronic Care Management (Chronic pain/Homelessness)   [name] [name]. is a 48 y.o. year old male who is a primary care patient of Valerie Roys, DO. The CCM team was consulted for assistance with chronic disease management and care coordination needs.    Review of patient status, including review of consultants reports, relevant laboratory and other test results, and collaboration with appropriate care team members and the patient's provider was performed as part of comprehensive patient evaluation and provision of chronic care management services.     Received a call this am from [name]. He explained his situation of being homeless and now also being the caregiver for his 8y/o daughter. He reached out to me for resources. Conference call between [facility] and [facility] guide to find resources. LCSW plans to reach out to patient to assist him with this.      Goals Addressed            This Visit's Progress   . Homeless with daughter  (pt-stated)       Current Barriers:  Marland Kitchen Knowledge Deficits related to on where to find appropriate shelter for he and his daughter . Film/video editor.  . Transportation barriers  Nurse Case Manager Clinical Goal(s):  Marland Kitchen Over the next 30 days, patient will work with CM clinical social worker to find appropriate housing/shelter for patient and his daughter  Interventions:  . Collaborated with LCSW ,Care Guide, [employer] regarding assistance with shelter options for patient . Discussed plans with patient for ongoing care management follow up and provided patient with direct contact information for care management team . Care Guide referral for Homelessness . Social Work referral for homelessness  Patient Self Care Activities:  . Currently UNABLE TO  independently find appropriate housing options for himself and his daughter  Initial goal documentation          The CM team will reach out to the patient again over the next 7 days.  The patient has been provided with contact information for the chronic care management team and has been advised to call with any health related questions or concerns.   Merlene Morse Jessenia Filippone RN, BSN Nurse Case Editor, commissioning Family Practice/[facility] Care Management  [phone]) Business Mobile

## 2019-05-09 NOTE — Chronic Care Management (AMB) (Signed)
  Chronic Care Management    Clinical Social Work General Note  05/09/2019 Name: Johnny [name]. MRN: [id number] DOB: 1971-03-04  Johnny [name]. is a 48 y.o. year old male who is a primary care patient of Johnny Roys, DO. The CCM was consulted to assist the patient with Housing.   Review of patient status, including review of consultants reports, relevant laboratory and other test results, and collaboration with appropriate care team members and the patient's provider was performed as part of comprehensive patient evaluation and provision of chronic care management services.    Goals Addressed    . "I need safe and stable housing" (pt-stated)       Current Barriers:  . Financial constraints . Limited social support . Limited access to food . Housing barriers . Limited education about housing resources available during COVID-19*  Clinical Social Work Clinical Goal(s):  Marland Kitchen Over the next 90 days, client will work with SW to address concerns related to lack of stable housing. . Over the next 90 days, client will follow up with crisis support resources * as directed by SW  Interventions: . Patient interviewed and appropriate assessments performed . LCSW sent text message to patient with a list of low income housing resources for him to refer back to while seeking stable housing . Provided patient with information about housing shelters/resources available during COVID- Patient prefers not to go to a shelter/boarding house at this time but resources/options are very limited . Advised patient to contact CCM team directly if any urgent needs present . LCSW collaborated with [facility]-Community Care Guide and RNCM in regards to pt's urgent housing needs . Assisted patient/caregiver with obtaining information about health plan benefits . LCSW completed several calls to various community resources/shelters/churches and left messages asking for any financial assistance/housing support  that they could provide during COVID-19. LCSW completed calls to [facility], [facility], [facility], [employer] and allied churches and left voice messages on 05/09/2019.Marland Kitchen [facility] is the only agency that contacted LCSW back and they stated that they are unable to accept patients that are not living in [facility] and that they are already at full capacity. [facility] staff reported that most shelters in [facility] are on hold as they are all maxed out of capacity as well.  . Advised patient to physically go to [employer] in order to seek shelter. Patient agreeable to do so and is also going to DSS today to gain emergency food stamps and assistance.   Patient Self Care Activities:  . Attends all scheduled provider appointments . Calls provider office for new concerns or questions  Please see past updates related to this goal by clicking on the "Past Updates" button in the selected goal     Follow Up Plan: SW will follow up with patient by phone over the next week      [name], [place], [name], [email or web address] Phone: [phone]

## 2019-05-09 NOTE — Chronic Care Management (AMB) (Signed)
  Chronic Care Management    Clinical Social Work General Note  05/09/2019 Name: Johnny [name]. MRN: [id number] DOB: October 02, 1971  [name] is a 48 y.o. year old male who is a primary care patient of Valerie Roys, DO. The CCM was consulted to assist the patient with Housing.   Review of patient status, including review of consultants reports, relevant laboratory and other test results, and collaboration with appropriate care team members and the patient's provider was performed as part of comprehensive patient evaluation and provision of chronic care management services.    LCSW received incoming call from patient. He states that he contacted [address] and was advised that the only housing resource option that is still showing up available in their system is [employer]. Patient was advised to physically go to their office since we are having difficulty reaching anyone there by phone. Patient reports that he just received his stimulus check as well which will be a great help during this desperate time. LCSW will follow up again next week and [employer] will as well.  Follow Up Plan: SW will follow up with patient by phone over the next week      [name], [place], [name], [email or web address] Phone: [phone]

## 2019-05-12 ENCOUNTER — Other Ambulatory Visit: Payer: Self-pay

## 2019-05-12 ENCOUNTER — Ambulatory Visit: Payer: Medicaid Other | Admitting: Licensed Clinical Social Worker

## 2019-05-12 DIAGNOSIS — Z5900 Homelessness unspecified: Secondary | ICD-10-CM

## 2019-05-12 NOTE — Chronic Care Management (AMB) (Signed)
  Chronic Care Management    Clinical Social Work General Note  05/12/2019 Name: Johnny [name]. MRN: [id number] DOB: October 07, 1971  Johnny [name]. is a 48 y.o. year old male who is a primary care patient of Johnny Roys, DO. The CCM was consulted to assist the patient with [employer].   Review of patient status, including review of consultants reports, relevant laboratory and other test results, and collaboration with appropriate care team members and the patient's provider was performed as part of comprehensive patient evaluation and provision of chronic care management services.    Goals Addressed    . "I need safe and stable housing" (pt-stated)       Current Barriers:  . Financial constraints . Limited social support . Limited access to food . Housing barriers . Limited education about housing resources available during COVID-19*  Clinical Social Work Clinical Goal(s):  Marland Kitchen Over the next 90 days, client will work with SW to address concerns related to lack of stable housing. . Over the next 90 days, client will follow up with crisis support resources * as directed by SW  Interventions: . Patient interviewed and appropriate assessments performed . Collaboration with housing agencies/resources. LCSW received return call from several shelters ([place] and [place]) who denied being able to take patient in as they were at full capacity. Patient was updated on this information.  Marland Kitchen LCSW sent text message to patient with a list of financial support resources for him to refer back to whenever needed . Provided patient with information about housing resources available during [place]-  . Patient reports that he successfully moved into a new boarding house over the weekend. He reports that this boarding house is actually very nice and updated. He also reports that he is happy that he only has two other male roommates living there with him. Patient reports being happy  with his new living situation.   . Advised patient to contact CCM team directly if any urgent needs present . Assisted patient/caregiver with obtaining information about health plan benefits . LCSW provided update to [facility]  Patient Self Care Activities:  . Attends all scheduled provider appointments . Calls provider office for new concerns or questions  Please see past updates related to this goal by clicking on the "Past Updates" button in the selected goal    Follow Up Plan: SW will follow up with patient by phone over the next month      [name], [name], [name], [name] Phone: [phone]

## 2019-05-14 ENCOUNTER — Encounter: Payer: Self-pay | Admitting: Family Medicine

## 2019-05-14 ENCOUNTER — Other Ambulatory Visit: Payer: Self-pay

## 2019-05-14 ENCOUNTER — Ambulatory Visit (INDEPENDENT_AMBULATORY_CARE_PROVIDER_SITE_OTHER): Payer: Medicaid Other | Admitting: Family Medicine

## 2019-05-14 ENCOUNTER — Ambulatory Visit
Admission: RE | Admit: 2019-05-14 | Discharge: 2019-05-14 | Disposition: A | Discharge status: Home / Self Care | Payer: Medicaid Other | Source: Ambulatory Visit | Attending: Student | Admitting: Student

## 2019-05-14 DIAGNOSIS — M4722 Other spondylosis with radiculopathy, cervical region: Secondary | ICD-10-CM

## 2019-05-14 DIAGNOSIS — M5412 Radiculopathy, cervical region: Secondary | ICD-10-CM | POA: Insufficient documentation

## 2019-05-14 MED ORDER — HYDROCODONE-ACETAMINOPHEN 10-325 MG PO TABS: 1.0000 | ORAL_TABLET | Freq: Four times a day (QID) | ORAL | 0 refills | Status: DC | PRN

## 2019-05-14 NOTE — Assessment & Plan Note (Signed)
Having MRI today. Continues with significant pain. Not better on the 5-325 vicodin. Discussed again that we do not do chronic pain management in this office. Will increase his medicine to 10-325 and await MRI and referral to chronic pain. Continue to monitor. Call with any concerns.

## 2019-05-14 NOTE — Progress Notes (Signed)
There were no vitals taken for this visit.   Subjective:    Patient ID: Johnny Jewett., male    DOB: May 12, 1971, 48 y.o.   MRN: [id number]  HPI: Johnny Porte. is a 48 y.o. male  Chief Complaint  Patient presents with  . Pain   To have his MRI today on his neck. Seeing neurosurgery. Has been referred to pain management, but hasn't heard from them yet.   CHRONIC PAIN- he notes that he is feeling terrible. He has not been taking the robaxin. He started it 3 days ago, and it doesn't seem to be helping.  He notes that the hydrocodone is not helping all that much and has been feeling terrible.  Present dose: 20 Morphine equivalents Pain control status: uncontrolled Duration: chronic Location: Arm and between shoulder blades and middle of his back and low back Quality: aching Current Pain Level: severe Previous Pain Level: severe Breakthrough pain: yes Benefit from narcotic medications: unclear What Activities task can be accomplished with current medication? Able to do his ADLs Interested in weaning off narcotics:no   Stool softners/OTC fiber: no  Previous pain specialty evaluation: yes Non-narcotic analgesic meds: yes Narcotic contract: no   Relevant past medical, surgical, family and social history reviewed and updated as indicated. Interim medical history since our last visit reviewed. Allergies and medications reviewed and updated.  Review of Systems  Constitutional: Negative.   Respiratory: Negative.   Cardiovascular: Negative.   Musculoskeletal: Positive for arthralgias, back pain, myalgias, neck pain and neck stiffness. Negative for gait problem and joint swelling.  Skin: Negative.   Neurological: Negative.   Psychiatric/Behavioral: Negative.     Per HPI unless specifically indicated above     Objective:    There were no vitals taken for this visit.  Wt Readings from Last 3 Encounters:  04/30/19 182 lb (82.6 kg)  04/01/19 190 lb (86.2 kg)   10/09/18 200 lb (90.7 kg)    Physical Exam Vitals signs and nursing note reviewed.  Constitutional:      General: He is not in acute distress.    Appearance: Normal appearance. He is not ill-appearing, toxic-appearing or diaphoretic.  HENT:     Head: Normocephalic and atraumatic.     Right Ear: External ear normal.     Left Ear: External ear normal.     Nose: Nose normal.     Mouth/Throat:     Mouth: Mucous membranes are moist.     Pharynx: Oropharynx is clear.  Eyes:     General: No scleral icterus.       Right eye: No discharge.        Left eye: No discharge.     Conjunctiva/sclera: Conjunctivae normal.     Pupils: Pupils are equal, round, and reactive to light.  Neck:     Musculoskeletal: Normal range of motion.  Pulmonary:     Effort: Pulmonary effort is normal. No respiratory distress.     Comments: Speaking in full sentences Musculoskeletal: Normal range of motion.  Skin:    Coloration: Skin is not jaundiced or pale.     Findings: No bruising, erythema, lesion or rash.  Neurological:     Mental Status: He is alert and oriented to person, place, and time. Mental status is at baseline.  Psychiatric:        Mood and Affect: Mood normal.        Behavior: Behavior normal.        Thought Content:  Thought content normal.        Judgment: Judgment normal.     Results for orders placed or performed during the hospital encounter of 04/01/19  CBC  Result Value Ref Range   WBC 4.7 4.0 - 10.5 K/uL   RBC 4.93 4.22 - 5.81 MIL/uL   Hemoglobin 15.8 13.0 - 17.0 g/dL   HCT 45.8 39.0 - 52.0 %   MCV 92.9 80.0 - 100.0 fL   MCH 32.0 26.0 - 34.0 pg   MCHC 34.5 30.0 - 36.0 g/dL   RDW 13.1 11.5 - 15.5 %   Platelets 219 150 - 400 K/uL   nRBC 0.0 0.0 - 0.2 %  Troponin I - Once  Result Value Ref Range   Troponin I <0.03 <0.03 ng/mL  Comprehensive metabolic panel  Result Value Ref Range   Sodium 141 135 - 145 mmol/L   Potassium 3.4 (L) 3.5 - 5.1 mmol/L   Chloride 106 98 - 111  mmol/L   CO2 24 22 - 32 mmol/L   Glucose, Bld 133 (H) 70 - 99 mg/dL   BUN 10 6 - 20 mg/dL   Creatinine, Ser 1.00 0.61 - 1.24 mg/dL   Calcium 8.7 (L) 8.9 - 10.3 mg/dL   Total Protein 6.6 6.5 - 8.1 g/dL   Albumin 3.8 3.5 - 5.0 g/dL   AST 21 15 - 41 U/L   ALT 16 0 - 44 U/L   Alkaline Phosphatase 72 38 - 126 U/L   Total Bilirubin 0.7 0.3 - 1.2 mg/dL   GFR calc non Af Amer >60 >60 mL/min   GFR calc Af Amer >60 >60 mL/min   Anion gap 11 5 - 15  Fibrin derivatives D-Dimer ([facility] only)  Result Value Ref Range   Fibrin derivatives D-dimer ([facility]) 289.47 0.00 - 499.00 ng/mL (FEU)  Lipase, blood  Result Value Ref Range   Lipase 30 11 - 51 U/L      Assessment & Plan:   Problem List Items Addressed This Visit      Nervous and Auditory   Osteoarthritis of spine with radiculopathy, cervical region    Having MRI today. Continues with significant pain. Not better on the 5-325 vicodin. Discussed again that we do not do chronic pain management in this office. Will increase his medicine to 10-325 and await MRI and referral to chronic pain. Continue to monitor. Call with any concerns.       Relevant Medications   ibuprofen (ADVIL) 200 MG tablet   HYDROcodone-acetaminophen (NORCO) 10-325 MG tablet       Follow up plan: Return in about 2 weeks (around 05/28/2019) for Follow up pain.    . This visit was completed via Doximity due to the restrictions of the COVID-19 pandemic. All issues as above were discussed and addressed. Physical exam was done as above through visual confirmation on Doximity. If it was felt that the patient should be evaluated in the office, they were directed there. The patient verbally consented to this visit. . Location of the patient: home . Location of the provider: home . Those involved with this call:  . Provider: Park Liter, DO . CMA: Tiffany Reel, CMA . Front Desk/Registration: Linard Millers  . Time spent on call: 15 minutes with patient face to face via video  conference. More than 50% of this time was spent in counseling and coordination of care. 23 minutes total spent in review of patient's record and preparation of their chart.

## 2019-05-21 ENCOUNTER — Ambulatory Visit: Payer: Medicaid Other | Admitting: *Deleted

## 2019-05-21 DIAGNOSIS — Z5900 Homelessness unspecified: Secondary | ICD-10-CM

## 2019-05-21 DIAGNOSIS — G8929 Other chronic pain: Secondary | ICD-10-CM

## 2019-05-21 DIAGNOSIS — M542 Cervicalgia: Secondary | ICD-10-CM

## 2019-05-21 NOTE — Patient Instructions (Signed)
Thank you allowing the Chronic Care Management Team to be a part of your care! It was a pleasure speaking with you today!   CCM (Chronic Care Management) Team   Camila Maita RN, BSN Nurse Care Coordinator  [phone]  Catie [facility] PharmD  Clinical Pharmacist  [phone]  [name] LCSW Clinical Social Worker [phone]  Goals Addressed            This Visit's Progress   . COMPLETED: Homeless with daughter  (pt-stated)       Current Barriers:  Marland Kitchen Knowledge Deficits related to on where to find appropriate shelter for he and his daughter . Film/video editor.  . Transportation barriers  Nurse Case Manager Clinical Goal(s):  Marland Kitchen Over the next 30 days, patient will work with CM clinical social worker to find appropriate housing/shelter for patient and his daughter  Interventions:  . Collaborated with LCSW ,Care Guide, [employer] regarding assistance with shelter options for patient . Discussed plans with patient for ongoing care management follow up and provided patient with direct contact information for care management team . Care Guide referral for Homelessness . Social Work referral for homelessness  . Spoke with patient today to f/u patient reports he has stable housing . 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   Patient Self Care Activities:  . Currently UNABLE TO independently find appropriate housing options for himself and his daughter  Please see past updates related to this goal by clicking on the "Past Updates" button in the selected goal          The patient verbalized understanding of instructions provided today and declined a print copy of patient instruction materials.   The patient has been provided with contact information for the chronic care management team and has been advised to call with any health related questions or concerns.  No further follow up required: Continued to be followed by  LCSW

## 2019-05-21 NOTE — Chronic Care Management (AMB) (Signed)
  Chronic Care Management   Follow Up Note   05/21/2019 Name: Johnny [name]. MRN: [id number] DOB: [birth year]  Referred by: Valerie Roys, DO Reason for referral : Chronic Care Management (Neck pain/Financial constraints)   Johnny [name]. is a 48 y.o. year old male who is a primary care patient of Valerie Roys, DO. The CCM team was consulted for assistance with chronic disease management and care coordination needs.    Review of patient status, including review of consultants reports, relevant laboratory and other test results, and collaboration with appropriate care team members and the patient's provider was performed as part of comprehensive patient evaluation and provision of chronic care management services.    Goals Addressed            This Visit's Progress   . COMPLETED: Homeless with daughter  (pt-stated)       Current Barriers:  Marland Kitchen Knowledge Deficits related to on where to find appropriate shelter for he and his daughter . Film/video editor.  . Transportation barriers  Nurse Case Manager Clinical Goal(s):  Marland Kitchen Over the next 30 days, patient will work with CM clinical social worker to find appropriate housing/shelter for patient and his daughter  Interventions:  . Collaborated with LCSW ,Care Guide, [employer] regarding assistance with shelter options for patient . Discussed plans with patient for ongoing care management follow up and provided patient with direct contact information for care management team . Care Guide referral for Homelessness . Social Work referral for homelessness  . Spoke with patient today to f/u patient reports he has stable housing . 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   Patient Self Care Activities:  . Currently UNABLE TO independently find appropriate housing options for himself and his daughter  Please see past updates related to this goal by clicking on the "Past Updates"  button in the selected goal           The patient has been provided with contact information for the chronic care management team and has been advised to call with any health related questions or concerns.  No further follow up required: Patient states he is doing well and will call RNCM if he has needs.Continues to be followed by LCSW.   Merlene Morse Kalia Vahey RN, BSN Nurse Case Editor, [facility]ing [facility]  [phone]) Business Mobile

## 2019-05-29 ENCOUNTER — Telehealth: Payer: Self-pay | Admitting: Family Medicine

## 2019-05-29 NOTE — Telephone Encounter (Signed)
Pt states he really needs this med and does not get his injections until the end of the month. Out since yesterday

## 2019-05-29 NOTE — Telephone Encounter (Signed)
Copied from [place] [id number]. Topic: Quick Communication - Rx Refill/Question >> May 29, 2019  8:10 AM Margot Ables wrote: Medication: HYDROcodone-acetaminophen (NORCO) 10-325 MG tablet  - pt is out  Has the patient contacted their pharmacy? No - controlled Preferred Pharmacy (with phone number or street name): [id number] [place], [place] [address] [phone] (Phone) [phone] (Fax)

## 2019-05-30 ENCOUNTER — Other Ambulatory Visit: Payer: Self-pay

## 2019-05-30 ENCOUNTER — Encounter: Payer: Self-pay | Admitting: Family Medicine

## 2019-05-30 ENCOUNTER — Ambulatory Visit: Payer: Medicaid Other | Admitting: Family Medicine

## 2019-05-30 ENCOUNTER — Telehealth: Payer: Self-pay | Admitting: Family Medicine

## 2019-05-30 VITALS — BP 137/90 | HR 73 | Temp 98.2°F | Ht 72.0 in | Wt 179.0 lb

## 2019-05-30 DIAGNOSIS — M549 Dorsalgia, unspecified: Secondary | ICD-10-CM

## 2019-05-30 DIAGNOSIS — M542 Cervicalgia: Secondary | ICD-10-CM

## 2019-05-30 DIAGNOSIS — M4722 Other spondylosis with radiculopathy, cervical region: Secondary | ICD-10-CM

## 2019-05-30 DIAGNOSIS — G8929 Other chronic pain: Secondary | ICD-10-CM | POA: Diagnosis not present

## 2019-05-30 MED ORDER — HYDROCODONE-ACETAMINOPHEN 10-325 MG PO TABS: 1.0000 | ORAL_TABLET | Freq: Four times a day (QID) | ORAL | 0 refills | Status: DC | PRN

## 2019-05-30 MED ORDER — BACLOFEN 10 MG PO TABS: 10.0000 mg | ORAL_TABLET | Freq: Every day | ORAL | 0 refills | Status: DC

## 2019-05-30 NOTE — Assessment & Plan Note (Addendum)
To have ESI at the end of June. Will continue his medicine until he establishes with pain management. To see PM&R on 06/19/19. Urgent referral to PT made today- having spasms. Will add baclofen at night and see if they can do some dry needling. Refill of his pain medicine given today. Will need to be seen for any refills. Follow up 2 weeks.

## 2019-05-30 NOTE — Assessment & Plan Note (Signed)
To have ESI at the end of June. Will continue his medicine until he establishes with pain management. To see PM&R on 06/19/19. Urgent referral to PT made today- having spasms. Will add baclofen at night and see if they can do some dry needling. Refill of his pain medicine given today. Will need to be seen for any refills. Follow up 2 weeks.

## 2019-05-30 NOTE — Telephone Encounter (Signed)
Called pt appt scheduled for today at 1:00

## 2019-05-30 NOTE — Telephone Encounter (Signed)
He needs an appointment

## 2019-05-30 NOTE — Telephone Encounter (Signed)
Pain management has reached out to [facility] and he has not called them back- please get him to call them to schedule an appointment. Thanks.

## 2019-05-30 NOTE — Progress Notes (Signed)
BP 137/90   Pulse 73   Temp 98.2 F (36.8 C) (Oral)   Ht 6' (1.829 m)   Wt 179 lb (81.2 kg)   PF 96 L/min   BMI 24.28 kg/m    Subjective:    Patient ID: Johnny [name]., male    DOB: Mar 29, 1971, 48 y.o.   MRN: [id number]  HPI: Johnny [name]. is a 48 y.o. male  Chief Complaint  Patient presents with  . Pain    hydrocodone refill   CHRONIC PAIN  Present dose: 40  Morphine equivalents Pain control status: exacerbated- having significant pain just R of center in his back for the past couple of days, hurts whenever he moves his neck to the R Duration: chronic Location: Arm and between shoulder blades and middle of his back and low back Quality: aching  Current Pain Level: severe- worse Previous Pain Level: severe Breakthrough pain: yes Benefit from narcotic medications: yes What Activities task can be accomplished with current medication? Interested in weaning off narcotics: unclear   Stool softners/OTC fiber: no  Previous pain specialty evaluation: yes Non-narcotic analgesic meds: yes Narcotic contract: no  Relevant past medical, surgical, family and social history reviewed and updated as indicated. Interim medical history since our last visit reviewed. Allergies and medications reviewed and updated.  Review of Systems  Constitutional: Negative.   Respiratory: Negative.   Cardiovascular: Negative.   Musculoskeletal: Positive for back pain, myalgias, neck pain and neck stiffness. Negative for arthralgias, gait problem and joint swelling.  Skin: Negative.   Neurological: Negative.   Psychiatric/Behavioral: Negative.     Per HPI unless specifically indicated above     Objective:    BP 137/90   Pulse 73   Temp 98.2 F (36.8 C) (Oral)   Ht 6' (1.829 m)   Wt 179 lb (81.2 kg)   PF 96 L/min   BMI 24.28 kg/m   Wt Readings from Last 3 Encounters:  05/30/19 179 lb (81.2 kg)  04/30/19 182 lb (82.6 kg)  04/01/19 190 lb (86.2 kg)    Physical Exam  Vitals signs and nursing note reviewed.  Constitutional:      General: He is not in acute distress.    Appearance: Normal appearance. He is not ill-appearing, toxic-appearing or diaphoretic.  HENT:     Head: Normocephalic and atraumatic.     Right Ear: External ear normal.     Left Ear: External ear normal.     Nose: Nose normal.     Mouth/Throat:     Mouth: Mucous membranes are moist.     Pharynx: Oropharynx is clear.  Eyes:     General: No scleral icterus.       Right eye: No discharge.        Left eye: No discharge.     Extraocular Movements: Extraocular movements intact.     Conjunctiva/sclera: Conjunctivae normal.     Pupils: Pupils are equal, round, and reactive to light.  Neck:     Musculoskeletal: Normal range of motion and neck supple.  Cardiovascular:     Rate and Rhythm: Normal rate and regular rhythm.     Pulses: Normal pulses.     Heart sounds: Normal heart sounds. No murmur. No friction rub. No gallop.   Pulmonary:     Effort: Pulmonary effort is normal. No respiratory distress.     Breath sounds: Normal breath sounds. No stridor. No wheezing, rhonchi or rales.  Chest:     Chest  wall: No tenderness.  Musculoskeletal:        General: Tenderness present. No deformity or signs of injury.     Right lower leg: No edema.     Left lower leg: No edema.     Comments: Rhomboid spasm on the R  Skin:    General: Skin is warm and dry.     Capillary Refill: Capillary refill takes less than 2 seconds.     Coloration: Skin is not jaundiced or pale.     Findings: No bruising, erythema, lesion or rash.  Neurological:     General: No focal deficit present.     Mental Status: He is alert and oriented to person, place, and time. Mental status is at baseline.  Psychiatric:        Mood and Affect: Mood normal.        Behavior: Behavior normal.        Thought Content: Thought content normal.        Judgment: Judgment normal.     Results for orders placed or performed during  the hospital encounter of 04/01/19  CBC  Result Value Ref Range   WBC 4.7 4.0 - 10.5 K/uL   RBC 4.93 4.22 - 5.81 MIL/uL   Hemoglobin 15.8 13.0 - 17.0 g/dL   HCT 45.8 39.0 - 52.0 %   MCV 92.9 80.0 - 100.0 fL   MCH 32.0 26.0 - 34.0 pg   MCHC 34.5 30.0 - 36.0 g/dL   RDW 13.1 11.5 - 15.5 %   Platelets 219 150 - 400 K/uL   nRBC 0.0 0.0 - 0.2 %  Troponin I - Once  Result Value Ref Range   Troponin I <0.03 <0.03 ng/mL  Comprehensive metabolic panel  Result Value Ref Range   Sodium 141 135 - 145 mmol/L   Potassium 3.4 (L) 3.5 - 5.1 mmol/L   Chloride 106 98 - 111 mmol/L   CO2 24 22 - 32 mmol/L   Glucose, Bld 133 (H) 70 - 99 mg/dL   BUN 10 6 - 20 mg/dL   Creatinine, Ser 1.00 0.61 - 1.24 mg/dL   Calcium 8.7 (L) 8.9 - 10.3 mg/dL   Total Protein 6.6 6.5 - 8.1 g/dL   Albumin 3.8 3.5 - 5.0 g/dL   AST 21 15 - 41 U/L   ALT 16 0 - 44 U/L   Alkaline Phosphatase 72 38 - 126 U/L   Total Bilirubin 0.7 0.3 - 1.2 mg/dL   GFR calc non Af Amer >60 >60 mL/min   GFR calc Af Amer >60 >60 mL/min   Anion gap 11 5 - 15  Fibrin derivatives D-Dimer ([facility] only)  Result Value Ref Range   Fibrin derivatives D-dimer ([facility]) 289.47 0.00 - 499.00 ng/mL (FEU)  Lipase, blood  Result Value Ref Range   Lipase 30 11 - 51 U/L      Assessment & Plan:   Problem List Items Addressed This Visit      Nervous and Auditory   Osteoarthritis of spine with radiculopathy, cervical region - Primary    To have ESI at the end of June. Will continue his medicine until he establishes with pain management. To see PM&R on 06/19/19. Urgent referral to PT made today- having spasms. Will add baclofen at night and see if they can do some dry needling. Refill of his pain medicine given today. Will need to be seen for any refills. Follow up 2 weeks.       Relevant Medications  Methocarbamol (ROBAXIN PO)   baclofen (LIORESAL) 10 MG tablet   HYDROcodone-acetaminophen (NORCO) 10-325 MG tablet   Other Relevant Orders   Ambulatory  referral to Physical Therapy     Other   Back pain   Relevant Medications   Methocarbamol (ROBAXIN PO)   baclofen (LIORESAL) 10 MG tablet   HYDROcodone-acetaminophen (NORCO) 10-325 MG tablet   Other Relevant Orders   Ambulatory referral to Physical Therapy   Chronic neck pain    To have ESI at the end of June. Will continue his medicine until he establishes with pain management. To see PM&R on 06/19/19. Urgent referral to PT made today- having spasms. Will add baclofen at night and see if they can do some dry needling. Refill of his pain medicine given today. Will need to be seen for any refills. Follow up 2 weeks.       Relevant Medications   Methocarbamol (ROBAXIN PO)   baclofen (LIORESAL) 10 MG tablet   HYDROcodone-acetaminophen (NORCO) 10-325 MG tablet       Follow up plan: Return in about 2 weeks (around 06/13/2019) for follow up pain.

## 2019-06-05 ENCOUNTER — Ambulatory Visit: Payer: Medicaid Other | Admitting: Family Medicine

## 2019-06-10 ENCOUNTER — Telehealth: Payer: Self-pay | Admitting: Family Medicine

## 2019-06-10 NOTE — Telephone Encounter (Signed)
He is too soon for his pain medicine. He needs to be seen for refills. He has also been called by pain management- so please make sure that he calls them back so he can be seen

## 2019-06-10 NOTE — Telephone Encounter (Signed)
Copied from [place] [id number]. Topic: Quick Communication - Rx Refill/Question >> Jun 10, 2019 11:35 AM Carolyn Stare wrote: Medication HYDROcodone-acetaminophen (NORCO) 10-325 MG tablet  Pref[facility]   Agent: Please be advised that RX refills may take up to 3 business days. We ask that you follow-up with your pharmacy.

## 2019-06-11 NOTE — Telephone Encounter (Signed)
Called and left a voicemail letting him know Dr.Johnson's response.

## 2019-06-13 ENCOUNTER — Other Ambulatory Visit: Payer: Self-pay

## 2019-06-13 ENCOUNTER — Encounter: Payer: Self-pay | Admitting: Family Medicine

## 2019-06-13 ENCOUNTER — Ambulatory Visit: Payer: Medicaid Other | Admitting: Family Medicine

## 2019-06-13 ENCOUNTER — Other Ambulatory Visit: Payer: Self-pay | Admitting: Family Medicine

## 2019-06-13 DIAGNOSIS — G894 Chronic pain syndrome: Secondary | ICD-10-CM | POA: Diagnosis not present

## 2019-06-13 MED ORDER — HYDROCODONE-ACETAMINOPHEN 10-325 MG PO TABS: 1.0000 | ORAL_TABLET | Freq: Four times a day (QID) | ORAL | 0 refills | Status: AC | PRN

## 2019-06-13 NOTE — Telephone Encounter (Signed)
Will discuss today at pts appt. Phone number put in the note.

## 2019-06-13 NOTE — Telephone Encounter (Signed)
Please advise 

## 2019-06-13 NOTE — Assessment & Plan Note (Signed)
To have ESI at the end of June. Will continue his medicine until he establishes with pain management- encouraged patient to call to schedule appointment. To see PM&R on 06/19/19. Continue baclofen at night and see if they can do some dry needling- to see PT on 6/30. Refill of his pain medicine given today. Will need to be seen for any refills. Follow up 2 weeks.

## 2019-06-13 NOTE — Progress Notes (Signed)
BP 131/86 (BP Location: Left Arm, Patient Position: Sitting, Cuff Size: Normal)   Pulse 84   Temp 98.9 F (37.2 C) (Oral)   Ht 6' (1.829 m)   Wt 179 lb 6.4 oz (81.4 kg)   SpO2 97%   BMI 24.33 kg/m    Subjective:    Patient ID: Johnny Jewett., male    DOB: 06-03-1971, 48 y.o.   MRN: [id number]  HPI: Johnny Penkala. is a 48 y.o. male  Chief Complaint  Patient presents with  . Back Pain    Pain states upper back pain in the middle radiating to the right side.   CHRONIC PAIN  Present dose: 40 Morphine equivalents Pain control status: stable Duration: chronic Location: R side in the upper back in the shoulder blade Quality: aching Current Pain Level: moderate Previous Pain Level: severe Breakthrough pain: yes Benefit from narcotic medications: yes What Activities task can be accomplished with current medication? Interested in weaning off narcotics: unclear   Stool softners/OTC fiber: no  Previous pain specialty evaluation: yes Non-narcotic analgesic meds: yes Narcotic contract: no  Relevant past medical, surgical, family and social history reviewed and updated as indicated. Interim medical history since our last visit reviewed. Allergies and medications reviewed and updated.  Review of Systems  Constitutional: Negative.   Respiratory: Negative.   Cardiovascular: Negative.   Musculoskeletal: Positive for arthralgias, back pain, myalgias, neck pain and neck stiffness. Negative for gait problem and joint swelling.  Skin: Negative.   Neurological: Negative.   Psychiatric/Behavioral: Negative.     Per HPI unless specifically indicated above     Objective:    BP 131/86 (BP Location: Left Arm, Patient Position: Sitting, Cuff Size: Normal)   Pulse 84   Temp 98.9 F (37.2 C) (Oral)   Ht 6' (1.829 m)   Wt 179 lb 6.4 oz (81.4 kg)   SpO2 97%   BMI 24.33 kg/m   Wt Readings from Last 3 Encounters:  06/13/19 179 lb 6.4 oz (81.4 kg)  05/30/19 179 lb  (81.2 kg)  04/30/19 182 lb (82.6 kg)    Physical Exam Vitals signs and nursing note reviewed.  Constitutional:      General: He is not in acute distress.    Appearance: Normal appearance. He is not ill-appearing, toxic-appearing or diaphoretic.  HENT:     Head: Normocephalic and atraumatic.     Right Ear: External ear normal.     Left Ear: External ear normal.     Nose: Nose normal.     Mouth/Throat:     Mouth: Mucous membranes are moist.     Pharynx: Oropharynx is clear.  Eyes:     General: No scleral icterus.       Right eye: No discharge.        Left eye: No discharge.     Extraocular Movements: Extraocular movements intact.     Conjunctiva/sclera: Conjunctivae normal.     Pupils: Pupils are equal, round, and reactive to light.  Neck:     Musculoskeletal: Normal range of motion and neck supple.  Cardiovascular:     Rate and Rhythm: Normal rate and regular rhythm.     Pulses: Normal pulses.     Heart sounds: Normal heart sounds. No murmur. No friction rub. No gallop.   Pulmonary:     Effort: Pulmonary effort is normal. No respiratory distress.     Breath sounds: Normal breath sounds. No stridor. No wheezing, rhonchi or rales.  Chest:  Chest wall: No tenderness.  Musculoskeletal: Normal range of motion.  Skin:    General: Skin is warm and dry.     Capillary Refill: Capillary refill takes less than 2 seconds.     Coloration: Skin is not jaundiced or pale.     Findings: No bruising, erythema, lesion or rash.  Neurological:     General: No focal deficit present.     Mental Status: He is alert and oriented to person, place, and time. Mental status is at baseline.  Psychiatric:        Mood and Affect: Mood normal.        Behavior: Behavior normal.        Thought Content: Thought content normal.        Judgment: Judgment normal.     Results for orders placed or performed during the hospital encounter of 04/01/19  CBC  Result Value Ref Range   WBC 4.7 4.0 - 10.5  K/uL   RBC 4.93 4.22 - 5.81 MIL/uL   Hemoglobin 15.8 13.0 - 17.0 g/dL   HCT 45.8 39.0 - 52.0 %   MCV 92.9 80.0 - 100.0 fL   MCH 32.0 26.0 - 34.0 pg   MCHC 34.5 30.0 - 36.0 g/dL   RDW 13.1 11.5 - 15.5 %   Platelets 219 150 - 400 K/uL   nRBC 0.0 0.0 - 0.2 %  Troponin I - Once  Result Value Ref Range   Troponin I <0.03 <0.03 ng/mL  Comprehensive metabolic panel  Result Value Ref Range   Sodium 141 135 - 145 mmol/L   Potassium 3.4 (L) 3.5 - 5.1 mmol/L   Chloride 106 98 - 111 mmol/L   CO2 24 22 - 32 mmol/L   Glucose, Bld 133 (H) 70 - 99 mg/dL   BUN 10 6 - 20 mg/dL   Creatinine, Ser 1.00 0.61 - 1.24 mg/dL   Calcium 8.7 (L) 8.9 - 10.3 mg/dL   Total Protein 6.6 6.5 - 8.1 g/dL   Albumin 3.8 3.5 - 5.0 g/dL   AST 21 15 - 41 U/L   ALT 16 0 - 44 U/L   Alkaline Phosphatase 72 38 - 126 U/L   Total Bilirubin 0.7 0.3 - 1.2 mg/dL   GFR calc non Af Amer >60 >60 mL/min   GFR calc Af Amer >60 >60 mL/min   Anion gap 11 5 - 15  Fibrin derivatives D-Dimer ([facility] only)  Result Value Ref Range   Fibrin derivatives D-dimer ([facility]) 289.47 0.00 - 499.00 ng/mL (FEU)  Lipase, blood  Result Value Ref Range   Lipase 30 11 - 51 U/L      Assessment & Plan:   Problem List Items Addressed This Visit      Other   Chronic pain syndrome    To have ESI at the end of June. Will continue his medicine until he establishes with pain management- encouraged patient to call to schedule appointment. To see PM&R on 06/19/19. Continue baclofen at night and see if they can do some dry needling- to see PT on 6/30. Refill of his pain medicine given today. Will need to be seen for any refills. Follow up 2 weeks.       Relevant Medications   HYDROcodone-acetaminophen (NORCO) 10-325 MG tablet       Follow up plan: Return in about 2 weeks (around 06/27/2019).

## 2019-06-23 ENCOUNTER — Ambulatory Visit: Payer: Self-pay | Admitting: Licensed Clinical Social Worker

## 2019-06-23 ENCOUNTER — Telehealth: Payer: Medicaid Other

## 2019-06-23 NOTE — Chronic Care Management (AMB) (Signed)
  Chronic Care Management    Clinical Social Work Follow Up Note  06/23/2019 Name: Johnny [name]. MRN: [id number] DOB: September 28, 1971  Johnny [name]. is a 48 y.o. year old male who is a primary care patient of Johnny Roys, DO. The CCM team was consulted for assistance with Johnny [name].   Review of patient status, including review of consultants reports, other relevant assessments, and collaboration with appropriate care team members and the patient's provider was performed as part of comprehensive patient evaluation and provision of chronic care management services.     LCSW completed CCM outreach attempt today but was unable to reach patient successfully. A HIPPA compliant voice message was left encouraging patient to return call once available. LCSW rescheduled CCM SW appointment as well.  Follow Up Plan: SW will follow up with patient by phone.   Johnny [name], BSW, [name], [name] Phone: [phone]

## 2019-06-24 ENCOUNTER — Ambulatory Visit: Payer: Medicaid Other | Attending: Family Medicine

## 2019-07-01 ENCOUNTER — Ambulatory Visit: Payer: Medicaid Other

## 2019-07-03 ENCOUNTER — Other Ambulatory Visit: Payer: Self-pay

## 2019-07-03 ENCOUNTER — Encounter: Payer: Self-pay | Admitting: Family Medicine

## 2019-07-03 ENCOUNTER — Ambulatory Visit: Payer: Medicaid Other | Admitting: Family Medicine

## 2019-07-03 DIAGNOSIS — M5416 Radiculopathy, lumbar region: Secondary | ICD-10-CM | POA: Insufficient documentation

## 2019-07-03 DIAGNOSIS — M5136 Other intervertebral disc degeneration, lumbar region: Secondary | ICD-10-CM | POA: Insufficient documentation

## 2019-07-03 DIAGNOSIS — M4722 Other spondylosis with radiculopathy, cervical region: Secondary | ICD-10-CM

## 2019-07-03 MED ORDER — HYDROCODONE-ACETAMINOPHEN 10-325 MG PO TABS: 1.0000 | ORAL_TABLET | Freq: Four times a day (QID) | ORAL | 0 refills | Status: DC | PRN

## 2019-07-03 MED ORDER — BACLOFEN 10 MG PO TABS: 10.0000 mg | ORAL_TABLET | Freq: Two times a day (BID) | ORAL | 3 refills | Status: DC

## 2019-07-03 MED ORDER — PREDNISONE 10 MG PO TABS: ORAL_TABLET | ORAL | 0 refills | Status: DC

## 2019-07-03 NOTE — Progress Notes (Signed)
BP (!) 141/100   Pulse 86   Temp 98.9 F (37.2 C) (Oral)   SpO2 97%    Subjective:    Patient ID: Johnny Jewett., male    DOB: [birth year], 48 y.o.   MRN: [id number]  HPI: Johnny Dubuc. is a 48 y.o. male  Chief Complaint  Patient presents with  . Follow-up    Pain 10/10 last few day. Unsure of what causing spike other than got sick on Monday after eating a salad, so possibly due to emesis   CHRONIC PAIN- due to see pain management in 2 weeks. Did not see physiatry on 6/25. Has cancelled his appointments with physical therapy. Johnny [name] has not kept any appointments. Johnny [name] notes that Johnny [name] was sick on Monday with vomiting. Since then, Johnny [name] has been having numbness and shooting pain down his R arm.  Present dose: 40 Morphine equivalents Pain control status: uncontrolled Duration: chronic Location: R side in the upper back and shoulder blade Quality: aching Current Pain Level: severe Previous Pain Level: moderate Breakthrough pain: yes Benefit from narcotic medications: yes What Activities task can be accomplished with current medication? Able to do his ADLs Interested in weaning off narcotics:no   Stool softners/OTC fiber: no  Previous pain specialty evaluation: yes Non-narcotic analgesic meds: yes Narcotic contract: no  Relevant past medical, surgical, family and social history reviewed and updated as indicated. Interim medical history since our last visit reviewed. Allergies and medications reviewed and updated.  Review of Systems  Constitutional: Negative.   Respiratory: Negative.   Cardiovascular: Negative.   Gastrointestinal: Negative.   Musculoskeletal: Positive for myalgias, neck pain and neck stiffness. Negative for arthralgias, back pain, gait problem and joint swelling.    Per HPI unless specifically indicated above     Objective:    BP (!) 141/100   Pulse 86   Temp 98.9 F (37.2 C) (Oral)   SpO2 97%   Wt Readings from Last 3 Encounters:  06/13/19  179 lb 6.4 oz (81.4 kg)  05/30/19 179 lb (81.2 kg)  04/30/19 182 lb (82.6 kg)    Physical Exam Vitals signs and nursing note reviewed.  Constitutional:      General: Johnny [name] is not in acute distress.    Appearance: Normal appearance. Johnny [name] is not ill-appearing, toxic-appearing or diaphoretic.  HENT:     Head: Normocephalic and atraumatic.     Right Ear: External ear normal.     Left Ear: External ear normal.     Nose: Nose normal.     Mouth/Throat:     Mouth: Mucous membranes are moist.     Pharynx: Oropharynx is clear.  Eyes:     General: No scleral icterus.       Right eye: No discharge.        Left eye: No discharge.     Extraocular Movements: Extraocular movements intact.     Conjunctiva/sclera: Conjunctivae normal.     Pupils: Pupils are equal, round, and reactive to light.  Neck:     Musculoskeletal: Normal range of motion and neck supple.  Cardiovascular:     Rate and Rhythm: Normal rate and regular rhythm.     Pulses: Normal pulses.     Heart sounds: Normal heart sounds. No murmur. No friction rub. No gallop.   Pulmonary:     Effort: Pulmonary effort is normal. No respiratory distress.     Breath sounds: Normal breath sounds. No stridor. No wheezing, rhonchi or rales.  Chest:  Chest wall: No tenderness.  Musculoskeletal: Normal range of motion.  Skin:    General: Skin is warm and dry.     Capillary Refill: Capillary refill takes less than 2 seconds.     Coloration: Skin is not jaundiced or pale.     Findings: No bruising, erythema, lesion or rash.  Neurological:     General: No focal deficit present.     Mental Status: Johnny [name] is alert and oriented to person, place, and time. Mental status is at baseline.  Psychiatric:        Mood and Affect: Mood normal.        Behavior: Behavior normal.        Thought Content: Thought content normal.        Judgment: Judgment normal.     Results for orders placed or performed during the hospital encounter of 04/01/19  CBC  Result  Value Ref Range   WBC 4.7 4.0 - 10.5 K/uL   RBC 4.93 4.22 - 5.81 MIL/uL   Hemoglobin 15.8 13.0 - 17.0 g/dL   HCT 45.8 39.0 - 52.0 %   MCV 92.9 80.0 - 100.0 fL   MCH 32.0 26.0 - 34.0 pg   MCHC 34.5 30.0 - 36.0 g/dL   RDW 13.1 11.5 - 15.5 %   Platelets 219 150 - 400 K/uL   nRBC 0.0 0.0 - 0.2 %  Troponin I - Once  Result Value Ref Range   Troponin I <0.03 <0.03 ng/mL  Comprehensive metabolic panel  Result Value Ref Range   Sodium 141 135 - 145 mmol/L   Potassium 3.4 (L) 3.5 - 5.1 mmol/L   Chloride 106 98 - 111 mmol/L   CO2 24 22 - 32 mmol/L   Glucose, Bld 133 (H) 70 - 99 mg/dL   BUN 10 6 - 20 mg/dL   Creatinine, Ser 1.00 0.61 - 1.24 mg/dL   Calcium 8.7 (L) 8.9 - 10.3 mg/dL   Total Protein 6.6 6.5 - 8.1 g/dL   Albumin 3.8 3.5 - 5.0 g/dL   AST 21 15 - 41 U/L   ALT 16 0 - 44 U/L   Alkaline Phosphatase 72 38 - 126 U/L   Total Bilirubin 0.7 0.3 - 1.2 mg/dL   GFR calc non Af Amer >60 >60 mL/min   GFR calc Af Amer >60 >60 mL/min   Anion gap 11 5 - 15  Fibrin derivatives D-Dimer ([facility] only)  Result Value Ref Range   Fibrin derivatives D-dimer ([facility]) 289.47 0.00 - 499.00 ng/mL (FEU)  Lipase, blood  Result Value Ref Range   Lipase 30 11 - 51 U/L      Assessment & Plan:   Problem List Items Addressed This Visit      Nervous and Auditory   Osteoarthritis of spine with radiculopathy, cervical region    In exacerbation. Will treat with prednisone taper and continue pain medicine. Due to see pain management on 07/15/19. Due to see PT that same day. Continue to monitor. Call with any concerns.       Relevant Medications   predniSONE (DELTASONE) 10 MG tablet   baclofen (LIORESAL) 10 MG tablet   HYDROcodone-acetaminophen (NORCO) 10-325 MG tablet       Follow up plan: Return in about 2 weeks (around 07/17/2019).

## 2019-07-03 NOTE — Patient Instructions (Signed)
Physical Therapy July 21, 4:45PM

## 2019-07-04 ENCOUNTER — Telehealth: Payer: Self-pay

## 2019-07-04 NOTE — Telephone Encounter (Signed)
PA received for patients Hydrocodone.  Needs PA via [employer].   Awaiting provider to sign progress notes to complete PA and upload notes.

## 2019-07-05 ENCOUNTER — Encounter: Payer: Self-pay | Admitting: Family Medicine

## 2019-07-05 NOTE — Assessment & Plan Note (Signed)
In exacerbation. Will treat with prednisone taper and continue pain medicine. Due to see pain management on 07/15/19. Due to see PT that same day. Continue to monitor. Call with any concerns.

## 2019-07-06 ENCOUNTER — Emergency Department
Admission: EM | Admit: 2019-07-06 | Discharge: 2019-07-06 | Disposition: A | Discharge status: Home / Self Care | Payer: Medicaid Other | Attending: Emergency Medicine | Admitting: Emergency Medicine

## 2019-07-06 ENCOUNTER — Other Ambulatory Visit: Payer: Self-pay

## 2019-07-06 ENCOUNTER — Encounter: Payer: Self-pay | Admitting: Emergency Medicine

## 2019-07-06 DIAGNOSIS — M79621 Pain in right upper arm: Secondary | ICD-10-CM | POA: Diagnosis present

## 2019-07-06 DIAGNOSIS — F17210 Nicotine dependence, cigarettes, uncomplicated: Secondary | ICD-10-CM | POA: Insufficient documentation

## 2019-07-06 DIAGNOSIS — M5412 Radiculopathy, cervical region: Secondary | ICD-10-CM

## 2019-07-06 DIAGNOSIS — Z96651 Presence of right artificial knee joint: Secondary | ICD-10-CM | POA: Diagnosis not present

## 2019-07-06 NOTE — ED Provider Notes (Signed)
[facility] Emergency Department Provider Note ____________________________________________  Time seen: 1529  I have reviewed the triage vital signs and the nursing notes.  HISTORY  Chief Complaint  Arm Pain  HPI Johnny Alpern. is a 48 y.o. male presents himself to the ED for evaluation of chronic right upper extremity radicular symptoms.  Patient is currently under the care of his primary provider Merleen Nicely, is also been referred to Dr. Sharlet Salina for epidural steroid injections, and has been seen by neurology.  Patient reluctantly concedes that the chart review notes that indicate he has recently been evaluated and given prescriptions for hydrocodone, baclofen, and prednisone 3 days prior, are accurate.  He denies any interim injury or complaints.  He reports he is scheduled to see Dr. Sharlet Salina for evaluation on 721, which was originally rescheduled from 6-25 when he missed that appointment.  Patient is requesting a stronger spasm medicine, but he apparently has failed all previous antispasm medicines including Robaxin, tizanidine, and cyclobenzaprine.  She denies any chest pain, shortness of breath, nausea, vomiting, or dizziness.  He also denies any other concerns for acute illness at this time.  Past Medical History:  Diagnosis Date  . Arthritis    hands, knees, lower back  . Bilateral sciatica   . Carpal tunnel syndrome   . Chronic back pain   . Chronic neck pain   . Chronic pain syndrome   . GERD (gastroesophageal reflux disease)   . Herniated disc   . Traumatic amputation of left thumb 2013  . Wears dentures    full upper and lower    Patient Active Problem List   Diagnosis Date Noted  . Degeneration of lumbar intervertebral disc 07/03/2019  . Lumbar radiculopathy 07/03/2019  . Osteoarthritis of spine with radiculopathy, cervical region 04/17/2019  . Homeless single person 04/17/2019  . History of total knee replacement, right 04/25/2018  .  Pancreatic mass 01/14/2018  . Tobacco abuse 01/14/2018  . Chronic neck pain   . Diarrhea   . Abdominal pain, generalized   . Benign neoplasm of descending colon   . Benign neoplasm of ascending colon   . Intractable vomiting with nausea   . Reflux esophagitis   . Duodenal ulcer without hemorrhage or perforation   . Chronic pain syndrome 08/07/2017  . Vitamin D deficiency 06/20/2017  . Insomnia 06/19/2017  . Swelling of hand 08/20/2013  . Hand joint pain 07/02/2013  . Presence of artificial knee joint 01/17/2013  . Knee pain 01/17/2013  . 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 Acute pain due to trauma 09/23/2012  . 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 Back pain 09/09/2012    Past Surgical History:  Procedure Laterality Date  . AMPUTATION FINGER / THUMB Left 2013  . COLONOSCOPY WITH PROPOFOL N/A 01/03/2018   Procedure: COLONOSCOPY WITH PROPOFOL;  Surgeon: [name], MD;  Location: [facility];  Service: Endoscopy;  Laterality: N/A;  . ESOPHAGOGASTRODUODENOSCOPY (EGD) WITH PROPOFOL N/A 01/03/2018   Procedure: ESOPHAGOGASTRODUODENOSCOPY (EGD) WITH PROPOFOL;  Surgeon: [name], MD;  Location: [place];  Service: Endoscopy;  Laterality: N/A;  . HERNIA REPAIR    . HIP SURGERY    . JOINT REPLACEMENT Right    knee  . KNEE SURGERY    . neck fusion    . POLYPECTOMY N/A 01/03/2018   Procedure: POLYPECTOMY;  Surgeon: [name], MD;  Location: [facility];  Service: Endoscopy;  Laterality: N/A;  . REPLACEMENT TOTAL KNEE Right   .  SHOULDER SURGERY Right 2016 X 2  . SPINAL FUSION    . TONSILLECTOMY      Prior to Admission medications   Medication Sig Start Date End Date Taking? Authorizing Provider  baclofen (LIORESAL) 10 MG tablet Take 1 tablet (10 mg total) by mouth 2 (two) times daily. 07/03/19   Johnson, Megan P, DO  gabapentin (NEURONTIN) 800 MG tablet Take 1 tablet (800 mg total) by mouth 3 (three) times daily. 04/30/19   Johnson, Megan P, DO   HYDROcodone-acetaminophen (NORCO) 10-325 MG tablet Take 1 tablet by mouth every 6 (six) hours as needed for up to 14 days. 07/03/19 07/17/19  Park Liter P, DO  ibuprofen (ADVIL) 200 MG tablet Take by mouth.    [provider]  predniSONE (DELTASONE) 10 MG tablet 6 tabs today and tomorrow, 5 tabs the next 2 days, decrease by 1 every other day until gone 07/03/19   Johnson, Megan P, DO    Allergies Crab [shellfish allergy], Flexeril [cyclobenzaprine], Gabapentin, Codeine, Latex, Penicillins, Sulfamethoxazole-trimethoprim, and Tramadol  Family History  Problem Relation Age of Onset  . Cancer Father        sarcoma  . Cirrhosis Paternal Grandmother   . Cancer Paternal Grandfather        Pancreatic  . Fibromyalgia Sister   . Deafness Sister     Social History Social History   Tobacco Use  . Smoking status: Current Every Day Smoker    Packs/day: 0.50    Years: 25.00    Pack years: 12.50    Types: Cigarettes  . Smokeless tobacco: Never Used  Substance Use Topics  . Alcohol use: No  . Drug use: No    Review of Systems  Constitutional: Negative for fever. Eyes: Negative for visual changes. ENT: Negative for sore throat. Cardiovascular: Negative for chest pain. Respiratory: Negative for shortness of breath. Gastrointestinal: Negative for abdominal pain, vomiting and diarrhea. Genitourinary: Negative for dysuria. Musculoskeletal: Negative for back pain. Skin: Negative for rash. Neurological: Negative for headaches, focal weakness or numbness. Reports chronic RUE radicular symptoms.  ____________________________________________  PHYSICAL EXAM:  VITAL SIGNS: ED Triage Vitals  Enc Vitals Group     BP 07/06/19 1451 (!) 156/97     Pulse Rate 07/06/19 1451 88     Resp 07/06/19 1451 18     Temp 07/06/19 1451 98.8 F (37.1 C)     Temp Source 07/06/19 1451 Oral     SpO2 07/06/19 1451 98 %     Weight 07/06/19 1452 190 lb (86.2 kg)     Height 07/06/19 1452 6' (1.829 m)      Head Circumference --      Peak Flow --      Pain Score 07/06/19 1558 9     Pain Loc --      Pain Edu? --      Excl. in [place]? --     Constitutional: Alert and oriented. Well appearing and in no distress. Head: Normocephalic and atraumatic. Eyes: Conjunctivae are normal. Normal extraocular movements Neck: Supple. Normal ROM Cardiovascular: Normal rate, regular rhythm. Normal distal pulses. Respiratory: Normal respiratory effort.  Musculoskeletal: Nontender with normal range of motion in all extremities.  Neurologic: CN II-XII grossly intact. Normal composite fists. Normal intrinsic and opposition testing. Normal gait without ataxia. Normal speech and language. No gross focal neurologic deficits are appreciated. Skin:  Skin is warm, dry and intact. No rash noted. Psychiatric: Mood and affect are normal. Patient exhibits appropriate insight and judgment. ___________________________________________  RADIOLOGY  Not indicated ____________________________________________  PROCEDURES  Procedures Toradol injection - declined by patient ____________________________________________  INITIAL IMPRESSION / ASSESSMENT AND PLAN / ED COURSE  Johnny Jewett. was evaluated in Emergency Department on 07/06/2019 for the symptoms described in the history of present illness. He was evaluated in the context of the global COVID-19 pandemic, which necessitated consideration that the patient might be at risk for infection with the SARS-CoV-2 virus that causes COVID-19. Institutional protocols and algorithms that pertain to the evaluation of patients at risk for COVID-19 are in a state of rapid change based on information released by regulatory bodies including the CDC and federal and state organizations. These policies and algorithms were followed during the patient's care in the ED.  Patient with ED evaluation of chronic radicular symptoms.  Patient is currently under the care of pain management,  physical therapy, neurosurgery, and his PCP. He has recently been seen and given med refills. His request for a "stronger" muscle relaxant can not be granted during this ED visit. He has apparently failed multiple pain and spasm medicines in the past. He is referred to his PCP for further management. ____________________________________________  FINAL CLINICAL IMPRESSION(S) / ED DIAGNOSES  Final diagnoses:  Cervical radiculopathy      Carmie End, Dannielle Karvonen, PA-C 07/06/19 1650    Harvest Dark, MD 07/06/19 2210

## 2019-07-06 NOTE — Discharge Instructions (Addendum)
You are currently being treated by your primary care provider, neurosurgery, and pain management. Continue with the medications as prescribed. Keep all scheduled appointments. Return as necessary.

## 2019-07-06 NOTE — ED Triage Notes (Signed)
Pt arrived via POV with reports of right arm pain, pt states he has hx of pinched nerve in neck causing arm and back pain. Pt states the pain has been worsening since Monday.

## 2019-07-11 ENCOUNTER — Ambulatory Visit: Payer: Self-pay | Admitting: Licensed Clinical Social Worker

## 2019-07-11 ENCOUNTER — Ambulatory Visit: Payer: Self-pay | Admitting: *Deleted

## 2019-07-11 DIAGNOSIS — G894 Chronic pain syndrome: Secondary | ICD-10-CM

## 2019-07-11 DIAGNOSIS — G8929 Other chronic pain: Secondary | ICD-10-CM

## 2019-07-11 NOTE — Chronic Care Management (AMB) (Signed)
  Chronic Care Management   Follow Up Note   07/11/2019 Name: Johnny [name]. MRN: [id number] DOB: 01-Sep-1971  Referred by: Valerie Roys, DO Reason for referral : Chronic Care Management (Resources )   [name]. is a 48 y.o. year old male who is a primary care patient of Valerie Roys, DO. The CCM team was consulted for assistance with chronic disease management and care coordination needs.    Review of patient status, including review of consultants reports, relevant laboratory and other test results, and collaboration with appropriate care team members and the patient's provider was performed as part of comprehensive patient evaluation and provision of chronic care management services.    Goals Addressed            This Visit's Progress   . Transportation needs, I have no gas to get to my appts next week (pt-stated)       Current Barriers:  . Film/video editor.  . Transportation barriers  Nurse Case Manager Clinical Goal(s):  Marland Kitchen Over the next 30 days, patient will work with C-3 team and CCM to address needs related to Curator constraints.   Interventions:  . Collaborated with LCSW Brooke regarding patient's transportation needs . Reviewed scheduled/upcoming provider appointments including: Tuesday 7/21 Pain clinic and Ortho appointment, and 7/23 PCP appointment . Care Guide referral for transportation resources  Patient Self Care Activities:  . Currently UNABLE TO independently get to MD appointments related to unreliable income and transportation   Initial goal documentation         The care management team will reach out to the patient again over the next 30 days.   Merlene Morse Perlie Scheuring RN, BSN Nurse Case Editor, [facility]  [phone]) Business Mobile

## 2019-07-11 NOTE — Chronic Care Management (AMB) (Signed)
  Care Management   Follow Up Note   07/11/2019 Name: Johnny [name]. MRN: [id number] DOB: [birth year]  Referred by: Valerie Roys, DO Reason for referral : Care Coordination   Johnny [name]. is a 48 y.o. year old male who is a primary care patient of Valerie Roys, DO. The care management team was consulted for assistance with care management and care coordination needs.    Review of patient status, including review of consultants reports, relevant laboratory and other test results, and collaboration with appropriate care team members and the patient's provider was performed as part of comprehensive patient evaluation and provision of chronic care management services.    Goals Addressed    . "I have no food or water" (pt-stated)       Current Barriers:  . Financial constraints . Limited social support . Limited access to food . Housing barriers . Lack of transportation- Medicaid transportation enrollment  Clinical Social Work Clinical Goal(s):  Marland Kitchen Over the next 90 days, client will work with SW to address concerns related to food insecurity . Over the next 90 days, patient will be educated on available food support resources within the area that will support patient.  Interventions: . Patient interviewed and appropriate assessments performed . Provided patient with information about financial, crisis, food and housing support resources as patient continues to face financial strain. Patient has ran out of income and does not any money to fill up gas tank to get to two medical appointments next week. . Discussed plans with patient for ongoing care management follow up and provided patient with direct contact information for care management team . Confirmed that patient met [facility] who filled up his gas tank in order for patient to go [employer] to get hot meals prepared, additional food supplies if able, resource bag, and support .  Collaborated with RN Case Manager re: who is agreeable to contact [employer] to see if any relief can be provided again. ([employer] filled up patient's gas tank months prior) . Assisted patient/caregiver with obtaining information about health plan benefits . Collaborated with [employer] agencies: [employer], [employer], and [employer] to find resources for patient.  Marland Kitchen LCSW sent text message to patient with calendar of resources in terms of where to gain hot meals throughout the week so that patient can have this information nearby at all times to refer back to. LCSW also sent a text message with DSS contact information and encouraged patient to contact his Medicaid caseworker and get enrolled in their transportation program. LCSW also encouraged patient to consider [place] ($4 full day pass ride)  Patient Self Care Activities:  . Attends all scheduled provider appointments . Calls provider office for new concerns or questions  Please see past updates related to this goal by clicking on the "Past Updates" button in the selected goal      The patient has been provided with contact information for the care management team and has been advised to call with any health related questions or concerns.   [name], BSW, [name], [facility] [email or web address] Phone: [phone]

## 2019-07-15 ENCOUNTER — Other Ambulatory Visit: Payer: Self-pay

## 2019-07-15 ENCOUNTER — Ambulatory Visit
Payer: Medicaid Other | Attending: Student in an Organized Health Care Education/Training Program | Admitting: Student in an Organized Health Care Education/Training Program

## 2019-07-15 ENCOUNTER — Encounter (INDEPENDENT_AMBULATORY_CARE_PROVIDER_SITE_OTHER): Payer: Self-pay

## 2019-07-15 ENCOUNTER — Encounter: Payer: Self-pay | Admitting: Student in an Organized Health Care Education/Training Program

## 2019-07-15 ENCOUNTER — Ambulatory Visit: Payer: Medicaid Other | Attending: Family Medicine

## 2019-07-15 VITALS — BP 126/88 | HR 89 | Temp 98.3°F | Ht 72.0 in | Wt 185.0 lb

## 2019-07-15 DIAGNOSIS — Z720 Tobacco use: Secondary | ICD-10-CM

## 2019-07-15 DIAGNOSIS — M62830 Muscle spasm of back: Secondary | ICD-10-CM | POA: Insufficient documentation

## 2019-07-15 DIAGNOSIS — M47812 Spondylosis without myelopathy or radiculopathy, cervical region: Secondary | ICD-10-CM | POA: Diagnosis not present

## 2019-07-15 DIAGNOSIS — Z981 Arthrodesis status: Secondary | ICD-10-CM

## 2019-07-15 DIAGNOSIS — M5412 Radiculopathy, cervical region: Secondary | ICD-10-CM

## 2019-07-15 DIAGNOSIS — G894 Chronic pain syndrome: Secondary | ICD-10-CM

## 2019-07-15 DIAGNOSIS — Z96651 Presence of right artificial knee joint: Secondary | ICD-10-CM

## 2019-07-15 DIAGNOSIS — M792 Neuralgia and neuritis, unspecified: Secondary | ICD-10-CM

## 2019-07-15 DIAGNOSIS — S68119S Complete traumatic metacarpophalangeal amputation of unspecified finger, sequela: Secondary | ICD-10-CM

## 2019-07-15 NOTE — Telephone Encounter (Signed)
Prior Authorization initiated via [employer] for Confirmation # [id number]

## 2019-07-15 NOTE — Therapy (Signed)
[facility] [address] [place], [address] Phone: [phone]   Fax:  [phone]  Physical Therapy Evaluation  Patient Details  Name: Johnny [name]. MRN: [id number] Date of Birth: 05/09/1971 Referring Provider (PT): Park Liter DO   Encounter Date: 07/15/2019  PT End of Session - 07/15/19 1838    Visit Number  1    Number of Visits  13    Date for PT Re-Evaluation  08/26/19    PT Start Time  1645    PT Stop Time  0102    PT Time Calculation (min)  60 min    Activity Tolerance  Patient tolerated treatment well;Patient limited by pain    Behavior During Therapy  [facility] for tasks assessed/performed       Past Medical History:  Diagnosis Date   Arthritis    hands, knees, lower back   Bilateral sciatica    Carpal tunnel syndrome    Chronic back pain    Chronic neck pain    Chronic pain syndrome    GERD (gastroesophageal reflux disease)    Herniated disc    Traumatic amputation of left thumb 2013   Wears dentures    full upper and lower    Past Surgical History:  Procedure Laterality Date   AMPUTATION FINGER / THUMB Left 2013   COLONOSCOPY WITH PROPOFOL N/A 01/03/2018   Procedure: COLONOSCOPY WITH PROPOFOL;  Surgeon: [name], MD;  Location: [place];  Service: Endoscopy;  Laterality: N/A;   ESOPHAGOGASTRODUODENOSCOPY (EGD) WITH PROPOFOL N/A 01/03/2018   Procedure: ESOPHAGOGASTRODUODENOSCOPY (EGD) WITH PROPOFOL;  Surgeon: [name], MD;  Location: [place];  Service: Endoscopy;  Laterality: N/A;   HERNIA REPAIR     HIP SURGERY     JOINT REPLACEMENT Right    knee   KNEE SURGERY     neck fusion     POLYPECTOMY N/A 01/03/2018   Procedure: POLYPECTOMY;  Surgeon: [name], MD;  Location: [place];  Service: Endoscopy;  Laterality: N/A;   REPLACEMENT TOTAL KNEE Right    SHOULDER SURGERY Right 2016 X 2   SPINAL FUSION      TONSILLECTOMY      There were no vitals filed for this visit.   Subjective Assessment - 07/15/19 1830    Subjective  Pt is a 48 y/o right-handed M who presents to PT with medical dx of cervical spondylosis. Pt chief complaint is chronic neck pain s/p C5-C6 ACDF (2006) that mainly started 3 months ago, with right UE radicular sx since a week and a half ago. Pt reports a current 7/10 pain that is sharp, shock-like, constant pain that radiates to the back of his wrist. Pt reports a diminished sensation at his right elbow, down all his fingers. Pt reported suddenly waking up with a stabbing, cramping pain. Pt reports easing factors of Icy Hot (topical cream) and lying on his back with his arm supported. Pts aggravating factors are any activity regarding his cervical spine and right UE. Pt reports living alone and is not working currently. Pt currently only sleeps every 2-3 days, citing that he is unable to sleep otherwise. Pt sleeps on his either side but not in supine, reporting that he is unable to breathe while sleeping. He notes suddenly waking up gasping for air and has not been to a sleep specialist before or has hx of sleep apnea. Pt has difficulty with looking over his R shoulder while  driving. Pts PT goals are to reduce his pain, which he has all day and which affects all of his ADLs and overall quality of life. Pt also wishes to get back to prior level of function so he can open up his own [employer].    Pertinent History  C5-6 ACDF in 2006    Patient Stated Goals  To reduce his pain    Currently in Pain?  Yes    Pain Score  7     Pain Location  Arm    Pain Orientation  Right;Anterior;Posterior;Distal    Pain Descriptors / Information systems manager;Stabbing;Sharp    Pain Type  Acute pain;Neuropathic pain    Pain Radiating Towards  All fingers on R hand or elbow (less severe)    Pain Onset  1 to 4 weeks ago    Pain Frequency  Constant    Aggravating Factors   Activity    Pain Relieving  Factors  Icy Hot    Effect of Pain on Daily Activities  Completely inactive due to pain         [facility] PT Assessment - 07/15/19 1832      Assessment   Medical Diagnosis  Cervical spondylosis    Referring Provider (PT)  Park Liter DO    Onset Date/Surgical Date  07/05/19    Hand Dominance  Right      Precautions   Precautions  None      Restrictions   Weight Bearing Restrictions  No      Balance Screen   Has the patient fallen in the past 6 months  No    Has the patient had a decrease in activity level because of a fear of falling?   Yes    Is the patient reluctant to leave their home because of a fear of falling?   Yes      Home Environment   Living Environment  Unsure      Prior Function   Level of Independence  Independent    Vocation  Unemployed    Leisure  Working on cars      Observation/Other Assessments   Other Surveys   Neck Disability Index    Neck Disability Index   56%      ROM / Strength   AROM / PROM / Strength  AROM      AROM   Overall AROM   Deficits    Overall AROM Comments  Bilateral shoulder flexion, abduction WFL, R shoulder external rotation painful but [facility]    AROM Assessment Site  Cervical    Cervical Flexion  WFL    Cervical Extension  WFL    Cervical - Right Side Bend  limited and painful    Cervical - Left Side Bend  WFL    Cervical - Right Rotation  46 degrees    Cervical - Left Rotation  45 degrees      Palpation   Palpation comment  TTP right neck and periscapular region      Special Tests    Special Tests  Cervical    Cervical Tests  Spurling's;Dictraction;other      Spurling's   Findings  Negative    Side  Right      Distraction Test   Findngs  Negative    side  Right      other    Findings  Negative    Side  Right    Comment  Axial Compression  Objective measurements completed on examination: See above findings.     Therapeutic Exercise to address his muscle spasms and pain response, as well his to  improve his ROM. Supine cervical retractions x10 Seated scapular retractions x10 Seated self-snags with towel for R cervical rotation x10 Performed to improve pain and cervical R rotation. Manual Therapy to address his muscle spasms and pain response. Grades I-II AP mobs at upper-thoracic spine Ischemic compression to right (lower-medial) UT 90 sec- 2 min At the end of the session, pt reported an improved pain response.      PT Education - 07/15/19 1837    Education Details  Pt educated on plan of care and prognosis. Pt educated on technique/form on exercises and posture. Pt given HEP of self snags with towel for R rotation, supine cervical retractions, and seated scapular retractions.    Person(s) Educated  Patient    Methods  Explanation;Demonstration;Tactile cues;Verbal cues;Handout    Comprehension  Verbalized understanding;Returned demonstration;Verbal cues required;Tactile cues required       PT Short Term Goals - 07/15/19 1842      PT SHORT TERM GOAL #1   Title  Pt will be independent and compliant with his HEP program.    Time  2    Period  Weeks    Status  New    Target Date  07/29/19        PT Long Term Goals - 07/15/19 1842      PT LONG TERM GOAL #1   Title  Pt will score at least a 46% on the NDI to demonstrate significantly reduced disability and improved function.    Baseline  56%    Time  6    Period  Weeks    Status  New    Target Date  08/26/19      PT LONG TERM GOAL #2   Title  Pt will report a pain score of at most 5/10 to indicate a significantly improved pain response.    Baseline  7-10/10    Time  6    Period  Weeks    Status  New    Target Date  08/26/19             Plan - 07/15/19 1839    Clinical Impression Statement  Pt is a 48 y/o right-handed male who presents to PT with a primary PT dx of muscle spasms and guarding of the R neck and periscapular regions, with possible radial nerve involvement. Pt demonstrates  cervical/periscapular dysfunction, as indicated by TTP at his right-side of his neck and periscapular region (concordant sx), as well as his sx distribution that matches radial nerve distribution and may point to possible radial nerve involvement. Pt demonstrated responsiveness to seated scapular retractions (with good technique/form), supine cervical retractions, and self-snags with a towel to improve R rotation. Pt demonstrates increased pain severity that inhibits his mobility and function, as indicated by his moderate severity score (56%) on the NDI. Pt reported a decrease in sx with manual therapy treatments, indicating his pain adaptiveness. Pt will benefit from skilled therapy treatment in order to improve his aforementioned impairments, which will allow him to return to prior level of function and improve his overall quality of life.    Examination-Activity Limitations  Sleep    Examination-Participation Restrictions  Community Activity;Driving    Stability/Clinical Decision Making  Stable/Uncomplicated    Clinical Decision Making  Low    Rehab Potential  Good    PT Frequency  1x / week    PT Duration  6 weeks    PT Treatment/Interventions  ADLs/Self Care Home Management;Traction;Therapeutic activities;Therapeutic exercise;Manual techniques;Dry needling;Passive range of motion;Joint Manipulations;Spinal Manipulations;Patient/family education    PT Next Visit Plan  Assess neurodynamics, progress ROM/strengthening program/HEP    PT Home Exercise Plan  See education section.    Consulted and Agree with Plan of Care  Patient       Patient will benefit from skilled therapeutic intervention in order to improve the following deficits and impairments:  Impaired sensation, Pain, Postural dysfunction, Increased muscle spasms, Decreased mobility, Hypomobility, Decreased range of motion, Decreased strength, Decreased endurance, Decreased activity tolerance, Impaired flexibility  Visit Diagnosis: 1.  Radiculopathy, cervical region   2. Muscle spasm of back        Problem List Patient Active Problem List   Diagnosis Date Noted   S/P cervical spinal fusion (C5-C6 ACDF) 07/15/2019   Cervical radicular pain 07/15/2019   Cervical facet joint syndrome 07/15/2019   Degeneration of lumbar intervertebral disc 07/03/2019   Lumbar radiculopathy 07/03/2019   Cervical spondylosis 04/17/2019   Homeless single person 04/17/2019   History of total knee replacement, right 04/25/2018   Pancreatic mass 01/14/2018   Tobacco abuse 01/14/2018   Chronic neck pain    Diarrhea    Abdominal pain, generalized    Benign neoplasm of descending colon    Benign neoplasm of ascending colon    Intractable vomiting with nausea    Reflux esophagitis    Duodenal ulcer without hemorrhage or perforation    Chronic pain syndrome 08/07/2017   Vitamin D deficiency 06/20/2017   Insomnia 06/19/2017   Swelling of hand 08/20/2013   Hand joint pain 07/02/2013   Presence of artificial knee joint 01/17/2013   Knee pain 01/17/2013   Neuropathic pain of hand 11/29/2012   Acute pain due to trauma 09/23/2012   Traumatic amputation of finger 09/20/2012   Back pain 09/09/2012    Scarlette Calico, SPT 07/15/2019, 6:45 PM  [facility] [address] [place], [address] Phone: [phone]   Fax:  [phone]  Name: Johnny [name]. MRN: [id number] Date of Birth: 07/20/1971

## 2019-07-15 NOTE — Progress Notes (Signed)
Patient's Name: Johnny [name].  MRN: [id number]  Referring Provider: Valerie Roys, DO  DOB: December 14, 1971  PCP: Valerie Roys, DO  DOS: 07/15/2019  Note by: Gillis Santa, MD  Service setting: Ambulatory outpatient  Specialty: Interventional Pain Management  Location: [facility] (AMB) Pain Management Facility  Visit type: Initial Patient Evaluation  Patient type: New Patient   Primary Reason(s) for Visit: Encounter for initial evaluation of one or more chronic problems (new to examiner) potentially causing chronic pain, and posing a threat to normal musculoskeletal function. (Level of risk: High) CC: Shoulder Pain (rib cage)  HPI  [name] is a 48 y.o. year old, male patient, who comes today to see [place] for the first time for an initial evaluation of his chronic pain. He has Insomnia; Vitamin D deficiency; Chronic pain syndrome; Acute pain due to trauma; Back pain; Hand joint pain; Presence of artificial knee joint; Knee pain; Neuropathic pain of hand; Swelling of hand; Traumatic amputation of finger; Diarrhea; Abdominal pain, generalized; Benign neoplasm of descending colon; Benign neoplasm of ascending colon; Intractable vomiting with nausea; Reflux esophagitis; Duodenal ulcer without hemorrhage or perforation; Chronic neck pain; Pancreatic mass; Tobacco abuse; Cervical spondylosis; Homeless single person; Degeneration of lumbar intervertebral disc; History of total knee replacement, right; Lumbar radiculopathy; S/P cervical spinal fusion (C5-C6 ACDF); Cervical radicular pain; and Cervical facet joint syndrome on their problem list. Today he comes in for evaluation of his Shoulder Pain (rib cage)  Pain Assessment: Location: Right Shoulder Radiating: Pain radiaties down right side, shoulder down arm to mid rib cage Onset: More than a month ago Duration:  > 30month ago Quality: Sharp, Shooting, Throbbing, Nagging, Constant Severity: 7 /10 (subjective, self-reported pain score)  Note: Reported  level is inconsistent with clinical observations.                         When using our objective Pain Scale, levels between 6 and 10/10 are said to belong in an emergency room, as it progressively worsens from a 6/10, described as severely limiting, requiring emergency care not usually available at an outpatient pain management facility. At a 6/10 level, communication becomes difficult and requires great effort. Assistance to reach the emergency department may be required. Facial flushing and profuse sweating along with potentially dangerous increases in heart rate and blood pressure will be evident. Effect on ADL: limits my daily activities Timing: Constant Modifying factors: nothing helps BP: 126/88  HR: 89  Onset and Duration: Sudden Cause of pain: Unknown Severity: Getting worse, NAS-11 at its worse: 10/10, NAS-11 at its best: 6/10, NAS-11 now: 7/10 and NAS-11 on the average: 10/10 Timing: Not influenced by the time of the day Aggravating Factors: Bending, Lifiting and Motion Alleviating Factors: Medications, Nerve blocks and Bio freeze Associated Problems: Day-time cramps, Night-time cramps, Depression, Inability to concentrate, Numbness, Spasms, Tingling and Weakness Quality of Pain: Constant, Disabling, Distressing, Sharp, Sickening and Uncomfortable Previous Examinations or Tests: MRI scan and Neurological evaluation Previous Treatments: Narcotic medications  The patient comes into the clinics today for the first time for a chronic pain management evaluation.  48year old male with a history of C5-C6 ACDF who presents with a chief complaint of neck pain and primarily right arm, forearm and hand pain.  Patient states that over the last 2 to 3 months, his arm pain has extended more distally down to his forearm and hand.  He has difficulty holding utensils and with fine motor movements.  Patient  endorses throbbing, burning, tingling in his arm and hands.  Patient also endorses throbbing  and aching neck pain, shoulder pain, periscapular pain.  Patient is slated to start physical therapy today.  He has been evaluated by neurosurgery.  They recommended conservative therapy with cervical epidural steroid injection and physical therapy.  Patient has tried steroid therapy in the past.  He is currently on baclofen 10 mg twice daily as needed, gabapentin 800 mg 3 times daily along with hydrocodone 10 mg daily to twice daily as needed.  Patient to continue medication management with PCP.  Patient will not be a candidate for chronic opioid therapy at this clinic nor do I think it will be helpful in his chronic pain condition. O fnote patient does have a felony charge in 2011 of obtaining controlled substance by fraud.  Patient has been clean since.  He is excited to have a job now and states that he is more stable in his life.  Focus primarily on interventional pain management as below.  See assessment and plan.  Historic Controlled Substance Pharmacotherapy Review   07/03/2019  1   07/03/2019  Hydrocodone-Acetamin 10-325 MG  56.00 14 [address]   [id number]   [place] ([id number])   0  40.00 MME  Private Pay   [place]   Pharmacodynamics: Desired effects: Analgesia: The patient reports <50% benefit. Reported improvement in function: The patient reports medication allows him to accomplish basic ADLs. Clinically meaningful improvement in function (CMIF): Sustained CMIF goals met Perceived effectiveness: Described as relatively effective but with some room for improvement Undesirable effects: Side-effects or Adverse reactions: None reported Historical Monitoring: The patient  reports no history of drug use. List of all UDS Test(s): Lab Results  Component Value Date   CMK 349 [facility]) 04/15/2017   List of other Serum/Urine Drug Screening Test(s):  Lab Results  Component Value Date   ETH 388 ([place]) 04/15/2017   Historical Background Evaluation: [place] PMP: PDMP reviewed during this encounter. Six (6) year initial  data search conducted.             [place] Department of public safety, offender search: Editor, commissioning Information) Positive for  CONCURRENT TO SENTENCE NUMBER [id number] OBT PROP BY FALSE PR/CHTS/SER (PRINCIPAL) 12/07/2008 F[place]ON CLASS F CONSOLIDATED FOR JUDGMENT [id number] OBTAIN CONTR SUBST BY FRAUD (PRINCIPAL) 05/25/2009 FELON CLASS F CONSOLIDATED FOR JUDGMENT [id number] OBTAIN CONTR SUBST BY FRAUD (PRINCIPAL) 02/22/2009 FELON CLASS F CONSOLIDATED FOR JUDGMENT [id number] OBTAIN CONTR SUBST BY FRAUD (PRINCIPAL) 02/03/2009 FELON CLASS F  Risk Assessment Profile: Aberrant behavior: non-adherence to medication regimen, non-compliance with medical instructions on the proper use of the medication, obtaining controlled substances from inappropriate sources, obtaining medications from illicit sources, prescription  drug abuse, prescription misuse and unsanctioned use Risk factors for fatal opioid overdose: age 82-25 years old and drug-related convictions or arrests Fatal overdose hazard ratio (HR): Calculation deferred Non-fatal overdose hazard ratio (HR): Calculation deferred Risk of opioid abuse or dependence: 0.7-3.0% with doses ? 36 MME/day and 6.1-26% with doses ? 120 MME/day. Substance use disorder (SUD) risk level: See below Personal History of Substance Abuse (SUD-Substance use disorder):  Alcohol: Negative  Illegal Drugs: Negative  Rx Drugs: Negative  ORT Risk Level calculation: Low Risk Opioid Risk Tool - 07/15/19 0928      Family History of Substance Abuse   Alcohol  Negative    Illegal Drugs  Negative    Rx Drugs  Negative      Personal History of Substance Abuse   Alcohol  Negative    Illegal Drugs  Negative    Rx Drugs  Negative      Age   Age between 51-45 years   No      History of Preadolescent Sexual Abuse   History of Preadolescent Sexual Abuse  Negative or Male      Total Score   Opioid Risk Tool Scoring  0    Opioid Risk Interpretation  Low Risk      ORT Scoring  interpretation table:  Score <3 = Low Risk for SUD  Score between 4-7 = Moderate Risk for SUD  Score >8 = High Risk for Opioid Abuse   PHQ-2 Depression Scale:  Total score:    PHQ-2 Scoring interpretation table: (Score and probability of major depressive disorder)  Score 0 = No depression  Score 1 = 15.4% Probability  Score 2 = 21.1% Probability  Score 3 = 38.4% Probability  Score 4 = 45.5% Probability  Score 5 = 56.4% Probability  Score 6 = 78.6% Probability   PHQ-9 Depression Scale:  Total score:    PHQ-9 Scoring interpretation table:  Score 0-4 = No depression  Score 5-9 = Mild depression  Score 10-14 = Moderate depression  Score 15-19 = Moderately severe depression  Score 20-27 = Severe depression (2.4 times higher risk of SUD and 2.89 times higher risk of overuse)   Pharmacologic Plan: Non-opioid analgesic therapy offered.            Initial impression: Poor candidate for opioid analgesics.  Meds   Current Outpatient Medications:  .  baclofen (LIORESAL) 10 MG tablet, Take 1 tablet (10 mg total) by mouth 2 (two) times daily., Disp: 60 each, Rfl: 3 .  gabapentin (NEURONTIN) 800 MG tablet, Take 1 tablet (800 mg total) by mouth 3 (three) times daily., Disp: 270 tablet, Rfl: 1 .  HYDROcodone-acetaminophen (NORCO) 10-325 MG tablet, Take 1 tablet by mouth every 6 (six) hours as needed for up to 14 days., Disp: 56 tablet, Rfl: 0 .  ibuprofen (ADVIL) 200 MG tablet, Take by mouth., Disp: , Rfl:  .  predniSONE (DELTASONE) 10 MG tablet, 6 tabs today and tomorrow, 5 tabs the next 2 days, decrease by 1 every other day until gone (Patient not taking: Reported on 07/15/2019), Disp: 42 tablet, Rfl: 0  Imaging Review  Cervical Imaging: Cervical MR wo contrast:  Results for orders placed during the hospital encounter of 05/14/19  MR CERVICAL SPINE WO CONTRAST   Narrative CLINICAL DATA:  Initial evaluation for neck pain radiating between shoulders, with bilateral arm pain, worse on the  left. History of prior surgery.  EXAM: MRI CERVICAL SPINE WITHOUT CONTRAST  TECHNIQUE: Multiplanar, multisequence MR imaging of the cervical spine was performed. No intravenous contrast was administered.  COMPARISON:  Prior radiograph 12/12/2017.  FINDINGS: Alignment: Mild straightening of the normal cervical lordosis. Trace grade 1 anterolisthesis of C2 on C3 and C3 on C4, chronic and facet mediated.  Vertebrae: Prior ACDF at C5-6 with solid arthrodesis. Vertebral body heights maintained without acute or chronic fracture. Bone marrow signal intensity within normal limits. No discrete or worrisome osseous lesions. Prominent facet degeneration noted about the right C2-3 and left C3-4 facets. Associated reactive marrow edema about the left C3-4 facet due to facet arthritis (series 7, image 14). No other abnormal marrow edema.  Cord: Signal intensity within the cervical spinal cord is normal.  Posterior Fossa, vertebral arteries, paraspinal tissues: Visualized brain and posterior fossa within normal limits. Craniocervical junction normal. Paraspinous  and prevertebral soft tissues within normal limits. Normal intravascular flow voids seen within the vertebral arteries bilaterally.  Disc levels:  C2-C3: Trace anterolisthesis. Mild diffuse disc bulge, asymmetric to the right. Mild right-sided uncovertebral hypertrophy. Right greater than left facet degeneration. No significant spinal stenosis. Moderate right C3 foraminal stenosis.  C3-C4: Trace anterolisthesis. Mild diffuse disc bulge with bilateral uncovertebral hypertrophy. Prominent left with mild right facet degeneration. Resultant mild spinal stenosis. Moderate left greater than right C4 foraminal narrowing.  C4-C5:  Mild uncovertebral hypertrophy.  No stenosis.  C5-C6:  Prior ACDF.  No residual canal or foraminal stenosis.  C6-C7: Broad base posterior disc osteophyte flattens and partially effaces the ventral thecal  sac. Resultant mild spinal stenosis without cord deformity. Superimposed bilateral uncinate spurring with resultant moderate bilateral C7 foraminal narrowing.  C7-T1:  Mild left-sided facet hypertrophy.  No stenosis.  Visualized upper thoracic spine demonstrates no significant finding.  IMPRESSION: 1. Prior ACDF at C5-6 without residual or recurrent stenosis. 2. Adjacent segment disease with broad posterior disc osteophyte at C6-7, with resultant mild canal with moderate bilateral C7 foraminal stenosis. 3. Diffuse disc osteophyte with left greater than right facet hypertrophy at C3-4 with resultant mild canal and moderate bilateral C4 foraminal stenosis. 4. Right eccentric disc osteophyte with facet degeneration at C2-3 with resultant moderate right C3 foraminal stenosis. 5. Prominent facet arthritis on the right at C2-3 and on the left at C3-4, which could contribute to underlying neck pain.   Electronically Signed   By: Jeannine Boga M.D.   On: 05/14/2019 22:27    Results for orders placed during the hospital encounter of 12/12/17  DG Cervical Spine Complete   Narrative CLINICAL DATA:  Neck pain  EXAM: CERVICAL SPINE - COMPLETE 4+ VIEW  COMPARISON:  CT cervical spine 12/25/2012  FINDINGS: ACDF with solid fusion C5-6. Anterior osteophytes at C3-4 and C4-5 unchanged from the prior study. Mild disc degeneration C3-4 and C4-5. No significant foraminal encroachment. Negative for fracture. Prevertebral soft tissues normal  IMPRESSION: Solid fusion at C5-6. Mild cervical spine degenerative change without significant foraminal stenosis.   Electronically Signed   By: Franchot Gallo M.D.   On: 12/12/2017 13:29    Results for orders placed during the hospital encounter of 11/01/17  DG Shoulder Right   Narrative CLINICAL DATA:  48 year old male with right shoulder and humerus pain for the past 6 months.  EXAM: RIGHT SHOULDER - 2+ VIEW  COMPARISON:   None.  FINDINGS: There is no evidence of fracture or dislocation. Mild glenohumeral joint osteoarthritis. Soft tissues are unremarkable.  IMPRESSION: 1. Mild glenohumeral joint osteoarthritis. 2. No evidence of fracture or malalignment.   Electronically Signed   By: Jacqulynn Cadet M.D.   On: 11/01/2017 16:47     Thoracic Imaging: Thoracic MR wo contrast:  Results for orders placed during the hospital encounter of 11/15/17  MR THORACIC SPINE WO CONTRAST   Narrative CLINICAL DATA:  Chronic back pain, acutely worsened over the past week with bowel and bladder incontinence. No known injury.  EXAM: MRI THORACIC AND LUMBAR SPINE WITHOUT CONTRAST  TECHNIQUE: Multiplanar and multiecho pulse sequences of the thoracic and lumbar spine were obtained without intravenous contrast.  COMPARISON:  Thoracic and lumbar spine x-rays dated December 25, 2012. Lumbar spine MRI dated August 01, 2012.  FINDINGS: MRI THORACIC SPINE FINDINGS  Alignment:  Physiologic.  Vertebrae: No fracture, evidence of discitis, or bone lesion. Prior C5-C6 ACDF. Small Schmorl's nodes involving the inferior endplates of T8 and T9,  as well as the superior endplate of O87.  Cord:  Normal signal and morphology.  Paraspinal and other soft tissues: There is mild edema in the bilateral paraspinous muscles. Otherwise negative.  Disc levels:  Tiny left paracentral protrusion at T4-T5. Small left paracentral protrusion at T5-T6 indenting the left ventral thecal sac and minimally deforming the left ventral cord. Tiny central disc protrusions at T6-T7 and T7-T8. No significant spinal canal or neuroforaminal stenosis at any level.  MRI LUMBAR SPINE FINDINGS  Segmentation:  Standard.  Alignment:  Physiologic.  Vertebrae: No fracture, evidence of discitis, or bone lesion. Degenerative marrow edema along the anterior aspect of T12-L1 and L3-L4, and left lateral aspect of L5-S1.  Conus medullaris and cauda  equina: Conus extends to the L1 level. Conus and cauda equina appear normal.  Paraspinal and other soft tissues: Edema within the bilateral paraspinous muscles with at least three small focal intramuscular fluid collections, the largest which measures 1.4 x 1.0 x 2.7 cm (AP by transverse by CC). Edema is also noted within the left greater than right quadratus lumborum muscles.  Disc levels:  T12-L1:  Negative.  L1-L2:  Negative.  L2-L3:  Negative.  L3-L4: Diffuse disc bulge with annular fissure, progressed when compared to prior study. New mild central spinal canal stenosis with moderate left and mild right lateral recess narrowing. Mild bilateral neuroforaminal stenosis. Unchanged mild bilateral facet arthropathy.  L4-L5: Diffuse disc bulge and mild bilateral facet arthropathy resulting in mild bilateral lateral recess narrowing, similar to prior study. New mild bilateral neuroforaminal stenosis. No spinal canal stenosis.  L5-S1: Diffuse disc bulge and mild bilateral facet arthropathy resulting in moderate left neuroforaminal stenosis, worsened when compared to prior study. Mild right neuroforaminal stenosis is unchanged. No central spinal canal stenosis.  IMPRESSION: MR THORACIC SPINE IMPRESSION  1. No acute osseous abnormality. 2. Mild degenerative changes of the midthoracic spine without high-grade spinal canal or neuroforaminal stenosis at any level. 3. Mild nonspecific edema within the bilateral thoracic paraspinous muscles.  MR LUMBAR SPINE IMPRESSION  1. Edema within the bilateral lumbar paraspinous muscles with at least three small focal intramuscular fluid collections, the largest of which measures up to 2.7 cm. These findings are consistent with a nonspecific myositis, possibly infectious or traumatic in etiology. If infectious, a pyogenic infection is thought to be less likely given the relatively symmetric muscle involvement. 2. No acute osseous  abnormality. No evidence of osteomyelitis-diskitis or cauda equina syndrome. 3. Mild degenerative changes of the lower lumbar spine, slightly progressed when compared to prior study. New mild central spinal canal stenosis and moderate left lateral recess narrowing at L3-L4 which could irritate the descending left L4 nerve root. 4. New mild bilateral neuroforaminal stenosis at L4-L5. Progressed moderate left neuroforaminal stenosis at L5-S1.   Electronically Signed   By: Titus Dubin M.D.   On: 11/15/2017 18:43    Thoracic MR wo contrast: No procedure found. Thoracic MR w/wo contrast:  Results for orders placed during the hospital encounter of 12/11/17  MR THORACIC SPINE W WO CONTRAST   Narrative CLINICAL DATA:  Left-sided chest pain radiating to the back.  EXAM: MRI THORACIC WITHOUT AND WITH CONTRAST  TECHNIQUE: Multiplanar and multiecho pulse sequences of the thoracic spine were obtained without and with intravenous contrast.  CONTRAST:  72m MULTIHANCE GADOBENATE DIMEGLUMINE 529 MG/ML IV SOLN  COMPARISON:  11/15/2017  FINDINGS: MRI THORACIC SPINE FINDINGS  Alignment:  Very minimal curvature.  Vertebrae: No fracture or primary bone lesion. Scattered old endplate Schmorl's  nodes without edema or enhancement.  Cord:  No cord compression or primary cord lesion.  Paraspinal and other soft tissues: No para spinous lesion or significant soft tissue finding. There is what looks like a small sebaceous cyst in the posterior subcutaneous fat to the left of midline behind C7. This should not be significant.  Disc levels:  No abnormality at T3-4 above.  T4-5: Small left and disc bulge.  No stenosis.  T5-6: Shallow left posterolateral disc protrusion without neural compression. This could contribute to back pain.  T6-7:  Tiny central disc protrusion.  No neural compression.  T7-8: Tiny central disc protrusion.  No neural compression.  T8-9 and T9-10:   Normal.  T10-11:  Minimal disc bulge.  No stenosis.  T11-12: Normal.  No abnormal enhancement.  No change since the previous exam.  IMPRESSION: No change since the study of 1 month ago. No acute finding. Ordinary mild degenerative changes in the thoracic region. Shallow left posterolateral disc protrusion at T5-6 could possibly be symptomatic.   Electronically Signed   By: Nelson Chimes M.D.   On: 12/11/2017 14:46    Thoracic DG 2-3 views:  Results for orders placed during the hospital encounter of 01/08/17  DG Thoracic Spine 2 View   Narrative CLINICAL DATA:  48 year old male with increasing back pain radiating to the chest. Shortness of breath. Initial encounter.  EXAM: THORACIC SPINE 2 VIEWS  COMPARISON:  Chest radiographs 09/02/2013. Thoracic spine radiographs 12/25/2012. Cervical spine CT 12/25/2012.  FINDINGS: Hypoplastic ribs at T12. Otherwise normal thoracic segmentation. Lower cervical ACDF hardware Re demonstrated at C5-C6. Mild chronic anterolisthesis of C7 on T1 appears stable since 2014. Stable and overall normal thoracic vertebral height and alignment. Relatively preserved thoracic disc spaces. Posterior ribs appear intact. Visualized thoracic and upper abdominal visceral contours appear normal.  IMPRESSION: No acute osseous abnormality in the thoracic spine.   Electronically Signed   By: Genevie Ann M.D.   On: 01/08/2017 11:23    Lumbosacral Imaging: Lumbar MR wo contrast:  Results for orders placed during the hospital encounter of 11/15/17  MR LUMBAR SPINE WO CONTRAST   Narrative CLINICAL DATA:  Chronic back pain, acutely worsened over the past week with bowel and bladder incontinence. No known injury.  EXAM: MRI THORACIC AND LUMBAR SPINE WITHOUT CONTRAST  TECHNIQUE: Multiplanar and multiecho pulse sequences of the thoracic and lumbar spine were obtained without intravenous contrast.  COMPARISON:  Thoracic and lumbar spine x-rays dated  December 25, 2012. Lumbar spine MRI dated August 01, 2012.  FINDINGS: MRI THORACIC SPINE FINDINGS  Alignment:  Physiologic.  Vertebrae: No fracture, evidence of discitis, or bone lesion. Prior C5-C6 ACDF. Small Schmorl's nodes involving the inferior endplates of T8 and T9, as well as the superior endplate of P80.  Cord:  Normal signal and morphology.  Paraspinal and other soft tissues: There is mild edema in the bilateral paraspinous muscles. Otherwise negative.  Disc levels:  Tiny left paracentral protrusion at T4-T5. Small left paracentral protrusion at T5-T6 indenting the left ventral thecal sac and minimally deforming the left ventral cord. Tiny central disc protrusions at T6-T7 and T7-T8. No significant spinal canal or neuroforaminal stenosis at any level.  MRI LUMBAR SPINE FINDINGS  Segmentation:  Standard.  Alignment:  Physiologic.  Vertebrae: No fracture, evidence of discitis, or bone lesion. Degenerative marrow edema along the anterior aspect of T12-L1 and L3-L4, and left lateral aspect of L5-S1.  Conus medullaris and cauda equina: Conus extends to the L1 level. Conus  and cauda equina appear normal.  Paraspinal and other soft tissues: Edema within the bilateral paraspinous muscles with at least three small focal intramuscular fluid collections, the largest which measures 1.4 x 1.0 x 2.7 cm (AP by transverse by CC). Edema is also noted within the left greater than right quadratus lumborum muscles.  Disc levels:  T12-L1:  Negative.  L1-L2:  Negative.  L2-L3:  Negative.  L3-L4: Diffuse disc bulge with annular fissure, progressed when compared to prior study. New mild central spinal canal stenosis with moderate left and mild right lateral recess narrowing. Mild bilateral neuroforaminal stenosis. Unchanged mild bilateral facet arthropathy.  L4-L5: Diffuse disc bulge and mild bilateral facet arthropathy resulting in mild bilateral lateral recess narrowing,  similar to prior study. New mild bilateral neuroforaminal stenosis. No spinal canal stenosis.  L5-S1: Diffuse disc bulge and mild bilateral facet arthropathy resulting in moderate left neuroforaminal stenosis, worsened when compared to prior study. Mild right neuroforaminal stenosis is unchanged. No central spinal canal stenosis.  IMPRESSION: MR THORACIC SPINE IMPRESSION  1. No acute osseous abnormality. 2. Mild degenerative changes of the midthoracic spine without high-grade spinal canal or neuroforaminal stenosis at any level. 3. Mild nonspecific edema within the bilateral thoracic paraspinous muscles.  MR LUMBAR SPINE IMPRESSION  1. Edema within the bilateral lumbar paraspinous muscles with at least three small focal intramuscular fluid collections, the largest of which measures up to 2.7 cm. These findings are consistent with a nonspecific myositis, possibly infectious or traumatic in etiology. If infectious, a pyogenic infection is thought to be less likely given the relatively symmetric muscle involvement. 2. No acute osseous abnormality. No evidence of osteomyelitis-diskitis or cauda equina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 Gillis Santa, MD [facility] None  07/28/2019  9:30 AM CFP CCM SOCIAL WORK [facility]  07/28/2019  2:30 PM Rissell, [place] Bells, PT [facility] None  07/31/2019  1:45 PM Blythe Stanford, PT [facility] None  08/11/2019  1:45 PM Rissell, [place] Bells, PT [facility] None  08/14/2019  1:00 PM Blythe Stanford, PT [facility] None    Primary Care Physician: Valerie Roys, DO Location: [facility] Note by: Gillis Santa, MD Date: 07/15/2019; Time: 11:01 AM  Note: This dictation was prepared with Dragon dictation. Any transcriptional errors that may result from this process are unintentional.

## 2019-07-16 NOTE — Progress Notes (Signed)
BP 118/83   Pulse 81   Temp 98.2 F (36.8 C) (Oral)   Ht 6' (1.829 m)   Wt 180 lb (81.6 kg)   SpO2 96%   BMI 24.41 kg/m    Subjective:    Patient ID: Johnny [name]., male    DOB: Mar 29, 1971, 48 y.o.   MRN: [id number]  HPI: Zander Ingham. is a 48 y.o. male  Chief Complaint  Patient presents with  . Pain    f/u hydrocodone refill   CHRONIC PAIN- started PT 2 days ago, only had evaluation. Saw pain management. They have offered him interventional options, but will not prescribe him oral pain medicine. They also do not recommend it. He is scheduled for a cervical epidural steroid injection next week Present dose: 40 Morphine equivalents Pain control status: stable Duration: chronic Location: R side in the upper back an shoulder blade Quality: aching Current Pain Level: severe Previous Pain Level: moderate Breakthrough pain: yes Benefit from narcotic medications: yes What Activities task can be accomplished with current medication? Able to do his ADLs Interested in weaning off narcotics:no   Stool softners/OTC fiber: yes  Previous pain specialty evaluation: yes Non-narcotic analgesic meds: yes Narcotic contract: no   Relevant past medical, surgical, family and social history reviewed and updated as indicated. Interim medical history since our last visit reviewed. Allergies and medications reviewed and updated.  Review of Systems  Constitutional: Negative.   Respiratory: Negative.   Cardiovascular: Negative.   Musculoskeletal: Positive for arthralgias, myalgias, neck pain and neck stiffness. Negative for back pain, gait problem and joint swelling.  Skin: Negative.   Psychiatric/Behavioral: Negative.     Per HPI unless specifically indicated above     Objective:    BP 118/83   Pulse 81   Temp 98.2 F (36.8 C) (Oral)   Ht 6' (1.829 m)   Wt 180 lb (81.6 kg)   SpO2 96%   BMI 24.41 kg/m   Wt Readings from Last 3 Encounters:  07/17/19 180 lb  (81.6 kg)  07/15/19 185 lb (83.9 kg)  07/06/19 190 lb (86.2 kg)    Physical Exam Vitals signs and nursing note reviewed.  Constitutional:      General: He is not in acute distress.    Appearance: Normal appearance. He is not ill-appearing, toxic-appearing or diaphoretic.  HENT:     Head: Normocephalic and atraumatic.     Right Ear: External ear normal.     Left Ear: External ear normal.     Nose: Nose normal.     Mouth/Throat:     Mouth: Mucous membranes are moist.     Pharynx: Oropharynx is clear.  Eyes:     General: No scleral icterus.       Right eye: No discharge.        Left eye: No discharge.     Extraocular Movements: Extraocular movements intact.     Conjunctiva/sclera: Conjunctivae normal.     Pupils: Pupils are equal, round, and reactive to light.  Neck:     Musculoskeletal: Normal range of motion and neck supple.  Cardiovascular:     Rate and Rhythm: Normal rate and regular rhythm.     Pulses: Normal pulses.     Heart sounds: Normal heart sounds. No murmur. No friction rub. No gallop.   Pulmonary:     Effort: Pulmonary effort is normal. No respiratory distress.     Breath sounds: Normal breath sounds. No stridor. No wheezing, rhonchi  or rales.  Chest:     Chest wall: No tenderness.  Musculoskeletal: Normal range of motion.  Skin:    General: Skin is warm and dry.     Capillary Refill: Capillary refill takes less than 2 seconds.     Coloration: Skin is not jaundiced or pale.     Findings: No bruising, erythema, lesion or rash.  Neurological:     General: No focal deficit present.     Mental Status: He is alert and oriented to person, place, and time. Mental status is at baseline.  Psychiatric:        Mood and Affect: Mood normal.        Behavior: Behavior normal.        Thought Content: Thought content normal.        Judgment: Judgment normal.     Results for orders placed or performed during the hospital encounter of 04/01/19  CBC  Result Value Ref  Range   WBC 4.7 4.0 - 10.5 K/uL   RBC 4.93 4.22 - 5.81 MIL/uL   Hemoglobin 15.8 13.0 - 17.0 g/dL   HCT 45.8 39.0 - 52.0 %   MCV 92.9 80.0 - 100.0 fL   MCH 32.0 26.0 - 34.0 pg   MCHC 34.5 30.0 - 36.0 g/dL   RDW 13.1 11.5 - 15.5 %   Platelets 219 150 - 400 K/uL   nRBC 0.0 0.0 - 0.2 %  Troponin I - Once  Result Value Ref Range   Troponin I <0.03 <0.03 ng/mL  Comprehensive metabolic panel  Result Value Ref Range   Sodium 141 135 - 145 mmol/L   Potassium 3.4 (L) 3.5 - 5.1 mmol/L   Chloride 106 98 - 111 mmol/L   CO2 24 22 - 32 mmol/L   Glucose, Bld 133 (H) 70 - 99 mg/dL   BUN 10 6 - 20 mg/dL   Creatinine, Ser 1.00 0.61 - 1.24 mg/dL   Calcium 8.7 (L) 8.9 - 10.3 mg/dL   Total Protein 6.6 6.5 - 8.1 g/dL   Albumin 3.8 3.5 - 5.0 g/dL   AST 21 15 - 41 U/L   ALT 16 0 - 44 U/L   Alkaline Phosphatase 72 38 - 126 U/L   Total Bilirubin 0.7 0.3 - 1.2 mg/dL   GFR calc non Af Amer >60 >60 mL/min   GFR calc Af Amer >60 >60 mL/min   Anion gap 11 5 - 15  Fibrin derivatives D-Dimer ([facility] only)  Result Value Ref Range   Fibrin derivatives D-dimer ([facility]) 289.47 0.00 - 499.00 ng/mL (FEU)  Lipase, blood  Result Value Ref Range   Lipase 30 11 - 51 U/L      Assessment & Plan:   Problem List Items Addressed This Visit      Other   Chronic pain syndrome    Saw PT and pain management. They will not take over pain medication, will get ESI and PT. Discussed that we will only do pain medicine for 6 months total. If not doing better, will get him into [facility]. Will continue pain medicine. Continue to monitor. Call with any concerns.       Relevant Medications   HYDROcodone-acetaminophen (NORCO) 10-325 MG tablet       Follow up plan: Return in about 4 weeks (around 08/14/2019).

## 2019-07-16 NOTE — Telephone Encounter (Signed)
PA approved Prior Approval #: [id number] Effective Dates: 07/15/2019 - 09/13/2019  Routing to provider as FYI only.

## 2019-07-17 ENCOUNTER — Ambulatory Visit: Payer: Medicaid Other | Admitting: Family Medicine

## 2019-07-17 ENCOUNTER — Encounter: Payer: Self-pay | Admitting: Family Medicine

## 2019-07-17 ENCOUNTER — Other Ambulatory Visit: Payer: Self-pay

## 2019-07-17 DIAGNOSIS — G894 Chronic pain syndrome: Secondary | ICD-10-CM

## 2019-07-17 MED ORDER — HYDROCODONE-ACETAMINOPHEN 10-325 MG PO TABS: 1.0000 | ORAL_TABLET | Freq: Four times a day (QID) | ORAL | 0 refills | Status: DC | PRN

## 2019-07-17 NOTE — Assessment & Plan Note (Signed)
Saw PT and pain management. They will not take over pain medication, will get ESI and PT. Discussed that we will only do pain medicine for 6 months total. If not doing better, will get him into [facility]. Will continue pain medicine. Continue to monitor. Call with any concerns.

## 2019-07-22 ENCOUNTER — Ambulatory Visit: Payer: Medicaid Other

## 2019-07-23 ENCOUNTER — Ambulatory Visit
Payer: Medicaid Other | Attending: Student in an Organized Health Care Education/Training Program | Admitting: Student in an Organized Health Care Education/Training Program

## 2019-07-24 ENCOUNTER — Ambulatory Visit: Payer: Medicaid Other

## 2019-07-28 ENCOUNTER — Telehealth: Payer: Self-pay

## 2019-07-28 ENCOUNTER — Ambulatory Visit: Payer: Medicaid Other | Attending: Family Medicine

## 2019-07-28 ENCOUNTER — Ambulatory Visit: Payer: Self-pay | Admitting: Licensed Clinical Social Worker

## 2019-07-28 DIAGNOSIS — M5412 Radiculopathy, cervical region: Secondary | ICD-10-CM | POA: Insufficient documentation

## 2019-07-28 DIAGNOSIS — M62830 Muscle spasm of back: Secondary | ICD-10-CM | POA: Insufficient documentation

## 2019-07-28 NOTE — Chronic Care Management (AMB) (Signed)
  Care Management   Follow Up Note   07/28/2019 Name: Johnny [name]. MRN: [id number] DOB: [birth year]  Referred by: Valerie Roys, DO Reason for referral : Care Coordination   Johnny [name]. is a 48 y.o. year old male who is a primary care patient of Valerie Roys, DO. The care management team was consulted for assistance with care management and care coordination needs.    Review of patient status, including review of consultants reports, relevant laboratory and other test results, and collaboration with appropriate care team members and the patient's provider was performed as part of comprehensive patient evaluation and provision of chronic care management services.    LCSW completed CCM outreach attempt today but was unable to reach patient successfully. A HIPPA compliant voice message was left encouraging patient to return call once available. LCSW rescheduled CCM SW appointment as well.  A HIPPA compliant phone message was left for the patient providing contact information and requesting a return call.   [name], BSW, [name], [facility] [email or web address] Phone: [phone]

## 2019-07-30 ENCOUNTER — Ambulatory Visit
Payer: Medicaid Other | Attending: Student in an Organized Health Care Education/Training Program | Admitting: Student in an Organized Health Care Education/Training Program

## 2019-07-30 ENCOUNTER — Telehealth: Payer: Self-pay | Admitting: Family Medicine

## 2019-07-30 NOTE — Telephone Encounter (Signed)
Pt was suppose to go to an appt today to have an injection in his neck for his nerve pain. Pt took 2 gabapentin and the nerve pain was better. Pt would like to know if it is ok to increase his gabapentin (NEURONTIN) 800 MG tablet to  Taking 2 of them 2x's a day / if it is ok to do so he will need a refill / please advise

## 2019-07-31 ENCOUNTER — Other Ambulatory Visit: Payer: Self-pay

## 2019-07-31 ENCOUNTER — Ambulatory Visit: Payer: Medicaid Other

## 2019-07-31 DIAGNOSIS — M62830 Muscle spasm of back: Secondary | ICD-10-CM

## 2019-07-31 DIAGNOSIS — M5412 Radiculopathy, cervical region: Secondary | ICD-10-CM

## 2019-07-31 MED ORDER — GABAPENTIN 600 MG PO TABS: 1200.0000 mg | ORAL_TABLET | Freq: Three times a day (TID) | ORAL | 6 refills | Status: DC

## 2019-07-31 NOTE — Therapy (Signed)
[place] [facility] [address], [place], [address] Phone: [phone]   Fax:  [phone]  Patient Details  Name: Johnny [name]. MRN: [id number] Date of Birth: [birth year] Referring Provider:  Valerie Roys, DO  Encounter Date: 07/31/2019   PT/OT/SLP Screening Form  Time in: 1345   Time out: 1430   Complaint R sided LB and Neck pain with radiculopathy Past Medical Hx:   Injury Date: 4 months prior Pain Scale: 7/10  Patient's phone number: [phone]  Hx (this occurrence):  S: Pt reports a current 5-6/10 right sided nerve pain with R UE pain with intermittent lower R back pain. Pt has been unable to perform cervical retractions but has been doing his scapular retractions.     Assessment:  O: Manual Therapy to address and improve muscle spasms and pain response. STM to B (R>L) UT, SCM, Levator Scap Passive B Side-bending with manual OP Therapeutic Exercise to improve his pain response, strength, and activity tolerance with mobility. Supine cervical retractions x20 Seated Rows with RTB x20 Seated Active Cervical Rotation/Side-bending AROM x10 each direction At the end of the session, pt demonstrated 4/10 decrease in pain.   A: Pt demonstrated decreased pain response at the initial onset of treatment, compared to previous session. With manual technique targeted to his B neck musculature and passive ROM, pt reported feeling significant pain and sx relief. Pt demonstrated increase TTP and muscle spasms to LT at R UT, with occasional referral to distal R UE. Pt did report 7/10 increase in sx and pain, but it was able to go down to 4/10 with seated cervical AROM, supine cervical retractions, and resisted rows with RTB. Pt prescribed supine cervical retractions and seated rows with RTB, both to tolerance. Pt demonstrated and verbalized understanding and stated his encouragement with his progress. Pt continues to  demonstrate increased pain, strength and ROM deficits, as well as muscle spasms and guarding. Pt will continue to benefit from skilled therapy treatment in order to return to prior level of function.     Recommendations:    Comments: Continue HEP    []  Patient would benefit from an MD referral [x]  Patient would benefit from a full PT treatment. []  No intervention recommended at this time.    Blythe Stanford, PT DPT 07/31/2019, 2:19 PM  [place] [facility] [address] [place], [address] Phone: [phone]   Fax:  [phone]

## 2019-07-31 NOTE — Telephone Encounter (Signed)
I've sent him over 1200mg  TID. Thanks.

## 2019-07-31 NOTE — Telephone Encounter (Signed)
Patient notified

## 2019-08-04 ENCOUNTER — Ambulatory Visit: Payer: Medicaid Other

## 2019-08-07 ENCOUNTER — Ambulatory Visit: Payer: Medicaid Other

## 2019-08-11 ENCOUNTER — Ambulatory Visit: Payer: Medicaid Other

## 2019-08-12 ENCOUNTER — Encounter: Payer: Self-pay | Admitting: Family Medicine

## 2019-08-12 ENCOUNTER — Ambulatory Visit (INDEPENDENT_AMBULATORY_CARE_PROVIDER_SITE_OTHER): Payer: Medicaid Other | Admitting: Family Medicine

## 2019-08-12 ENCOUNTER — Other Ambulatory Visit: Payer: Self-pay

## 2019-08-12 DIAGNOSIS — M542 Cervicalgia: Secondary | ICD-10-CM

## 2019-08-12 DIAGNOSIS — M5416 Radiculopathy, lumbar region: Secondary | ICD-10-CM | POA: Diagnosis not present

## 2019-08-12 DIAGNOSIS — G8929 Other chronic pain: Secondary | ICD-10-CM | POA: Diagnosis not present

## 2019-08-12 MED ORDER — HYDROCODONE-ACETAMINOPHEN 10-325 MG PO TABS: 1.0000 | ORAL_TABLET | Freq: Four times a day (QID) | ORAL | 0 refills | Status: DC | PRN

## 2019-08-12 NOTE — Progress Notes (Signed)
BP 124/84   Pulse 87   Temp 98.1 F (36.7 C) (Oral)   Ht 6' (1.829 m)   Wt 190 lb (86.2 kg)   SpO2 93%   BMI 25.77 kg/m    Subjective:    Patient ID: Johnny Jewett., male    DOB: Dec 03, 1971, 48 y.o.   MRN: [id number]  HPI: Johnny [name]. is a 48 y.o. male  Chief Complaint  Patient presents with  . Follow-up  . Pain    hydrocodone refill   CHRONIC PAIN- missed his appointment for his epidural steroid injection. His arm is doing better. Back is not doing well. Started with pain in his low back that is going down his legs R>L  Present dose: 40 Morphine equivalents Pain control status: uncontrolled Duration: chronic Location: Neck is better, now low back is really acting up Quality: aching, sharp and shooting Current Pain Level: severe Previous Pain Level: severe Breakthrough pain: yes Benefit from narcotic medications: yes What Activities task can be accomplished with current medication? Able to do his ADLs Interested in weaning off narcotics:no   Stool softners/OTC fiber: no  Previous pain specialty evaluation: yes Non-narcotic analgesic meds: yes Narcotic contract: yes   Relevant past medical, surgical, family and social history reviewed and updated as indicated. Interim medical history since our last visit reviewed. Allergies and medications reviewed and updated.  Review of Systems  Constitutional: Negative.   Respiratory: Negative.   Cardiovascular: Negative.   Gastrointestinal: Negative.   Musculoskeletal: Positive for arthralgias, back pain and myalgias. Negative for gait problem, joint swelling, neck pain and neck stiffness.  Skin: Negative.   Neurological: Negative.   Psychiatric/Behavioral: Negative.     Per HPI unless specifically indicated above     Objective:    BP 124/84   Pulse 87   Temp 98.1 F (36.7 C) (Oral)   Ht 6' (1.829 m)   Wt 190 lb (86.2 kg)   SpO2 93%   BMI 25.77 kg/m   Wt Readings from Last 3 Encounters:   08/12/19 190 lb (86.2 kg)  07/17/19 180 lb (81.6 kg)  07/15/19 185 lb (83.9 kg)    Physical Exam Vitals signs and nursing note reviewed.  Constitutional:      General: He is not in acute distress.    Appearance: Normal appearance. He is not ill-appearing, toxic-appearing or diaphoretic.  HENT:     Head: Normocephalic and atraumatic.     Right Ear: External ear normal.     Left Ear: External ear normal.     Nose: Nose normal.     Mouth/Throat:     Mouth: Mucous membranes are moist.     Pharynx: Oropharynx is clear.  Eyes:     General: No scleral icterus.       Right eye: No discharge.        Left eye: No discharge.     Extraocular Movements: Extraocular movements intact.     Conjunctiva/sclera: Conjunctivae normal.     Pupils: Pupils are equal, round, and reactive to light.  Neck:     Musculoskeletal: Normal range of motion and neck supple.  Cardiovascular:     Rate and Rhythm: Normal rate and regular rhythm.     Pulses: Normal pulses.     Heart sounds: Normal heart sounds. No murmur. No friction rub. No gallop.   Pulmonary:     Effort: Pulmonary effort is normal. No respiratory distress.     Breath sounds: Normal breath sounds.  No stridor. No wheezing, rhonchi or rales.  Chest:     Chest wall: No tenderness.  Musculoskeletal: Normal range of motion.  Skin:    General: Skin is warm and dry.     Capillary Refill: Capillary refill takes less than 2 seconds.     Coloration: Skin is not jaundiced or pale.     Findings: No bruising, erythema, lesion or rash.  Neurological:     General: No focal deficit present.     Mental Status: He is alert and oriented to person, place, and time. Mental status is at baseline.  Psychiatric:        Mood and Affect: Mood normal.        Behavior: Behavior normal.        Thought Content: Thought content normal.        Judgment: Judgment normal.     Results for orders placed or performed during the hospital encounter of 04/01/19  CBC   Result Value Ref Range   WBC 4.7 4.0 - 10.5 K/uL   RBC 4.93 4.22 - 5.81 MIL/uL   Hemoglobin 15.8 13.0 - 17.0 g/dL   HCT 45.8 39.0 - 52.0 %   MCV 92.9 80.0 - 100.0 fL   MCH 32.0 26.0 - 34.0 pg   MCHC 34.5 30.0 - 36.0 g/dL   RDW 13.1 11.5 - 15.5 %   Platelets 219 150 - 400 K/uL   nRBC 0.0 0.0 - 0.2 %  Troponin I - Once  Result Value Ref Range   Troponin I <0.03 <0.03 ng/mL  Comprehensive metabolic panel  Result Value Ref Range   Sodium 141 135 - 145 mmol/L   Potassium 3.4 (L) 3.5 - 5.1 mmol/L   Chloride 106 98 - 111 mmol/L   CO2 24 22 - 32 mmol/L   Glucose, Bld 133 (H) 70 - 99 mg/dL   BUN 10 6 - 20 mg/dL   Creatinine, Ser 1.00 0.61 - 1.24 mg/dL   Calcium 8.7 (L) 8.9 - 10.3 mg/dL   Total Protein 6.6 6.5 - 8.1 g/dL   Albumin 3.8 3.5 - 5.0 g/dL   AST 21 15 - 41 U/L   ALT 16 0 - 44 U/L   Alkaline Phosphatase 72 38 - 126 U/L   Total Bilirubin 0.7 0.3 - 1.2 mg/dL   GFR calc non Af Amer >60 >60 mL/min   GFR calc Af Amer >60 >60 mL/min   Anion gap 11 5 - 15  Fibrin derivatives D-Dimer ([facility] only)  Result Value Ref Range   Fibrin derivatives D-dimer ([facility]) 289.47 0.00 - 499.00 ng/mL (FEU)  Lipase, blood  Result Value Ref Range   Lipase 30 11 - 51 U/L      Assessment & Plan:   Problem List Items Addressed This Visit      Nervous and Auditory   Lumbar radiculopathy    Acting up again. Doesn't remember what he did this time. On muscle relaxer, pain medicine and max dose gabapentin. Doing PT. Did not keep his appointment for [facility] as his neck is doing better. Will get him new referral to pain management. Reiterated to patient that we do not do long term pain medicine and can only provide medication for 6 months total. Call with any concerns.       Relevant Orders   Ambulatory referral to Pain Clinic     Other   Chronic neck pain    Doing much better on the higher dose of gabapentin. On muscle  relaxer, pain medicine and max dose gabapentin. Doing PT. Did not keep his appointment  for [facility] as his neck is doing better. Will get him new referral to pain management. Reiterated to patient that we do not do long term pain medicine and can only provide medication for 6 months total. Call with any concerns.       Relevant Medications   HYDROcodone-acetaminophen (NORCO) 10-325 MG tablet   Other Relevant Orders   Ambulatory referral to Pain Clinic       Follow up plan: Return in about 4 weeks (around 09/09/2019).

## 2019-08-12 NOTE — Assessment & Plan Note (Signed)
Acting up again. Doesn't remember what he did this time. On muscle relaxer, pain medicine and max dose gabapentin. Doing PT. Did not keep his appointment for [facility] as his neck is doing better. Will get him new referral to pain management. Reiterated to patient that we do not do long term pain medicine and can only provide medication for 6 months total. Call with any concerns.

## 2019-08-12 NOTE — Assessment & Plan Note (Signed)
Doing much better on the higher dose of gabapentin. On muscle relaxer, pain medicine and max dose gabapentin. Doing PT. Did not keep his appointment for [facility] as his neck is doing better. Will get him new referral to pain management. Reiterated to patient that we do not do long term pain medicine and can only provide medication for 6 months total. Call with any concerns.

## 2019-08-14 ENCOUNTER — Ambulatory Visit: Payer: Medicaid Other

## 2019-08-20 ENCOUNTER — Telehealth: Payer: Self-pay | Admitting: Family Medicine

## 2019-08-20 NOTE — Telephone Encounter (Signed)
Copied from [place] [id number]. Topic: Referral - Question >> Aug 14, 2019 11:13 AM Nils Flack wrote: Reason for CRM: I spoke to [facility] about referral, they have a limit on how many medicaid pts they can take.  They are unable to take this pt.  Where would you like me to send this referral? Thanks

## 2019-08-20 NOTE — Telephone Encounter (Signed)
Please refer him somewhere that takes his insurance (has already seen [facility]- so not there)

## 2019-08-29 NOTE — Telephone Encounter (Signed)
Checking in on this referral. Please refer patient somewhere that takes his insurance. Cannot see [facility] due to insurance, cannot see [facility] as has seen them previously.

## 2019-09-08 ENCOUNTER — Telehealth: Payer: Self-pay | Admitting: Family Medicine

## 2019-09-08 ENCOUNTER — Telehealth: Payer: Self-pay

## 2019-09-08 ENCOUNTER — Encounter: Payer: Self-pay | Admitting: Family Medicine

## 2019-09-08 ENCOUNTER — Ambulatory Visit (INDEPENDENT_AMBULATORY_CARE_PROVIDER_SITE_OTHER): Payer: Medicaid Other | Admitting: Family Medicine

## 2019-09-08 ENCOUNTER — Other Ambulatory Visit: Payer: Self-pay

## 2019-09-08 VITALS — BP 138/93 | HR 71 | Temp 98.2°F | Ht 72.0 in | Wt 185.0 lb

## 2019-09-08 DIAGNOSIS — M5431 Sciatica, right side: Secondary | ICD-10-CM

## 2019-09-08 DIAGNOSIS — M5416 Radiculopathy, lumbar region: Secondary | ICD-10-CM | POA: Diagnosis not present

## 2019-09-08 MED ORDER — HYDROCODONE-ACETAMINOPHEN 10-325 MG PO TABS: 1.0000 | ORAL_TABLET | Freq: Four times a day (QID) | ORAL | 0 refills | Status: DC | PRN

## 2019-09-08 NOTE — Telephone Encounter (Signed)
Patient would like to go to [facility] as long as they take his insurance.

## 2019-09-08 NOTE — Telephone Encounter (Signed)
PA Approved

## 2019-09-08 NOTE — Assessment & Plan Note (Signed)
Acting up again. Doesn't remember what he did this time. On muscle relaxer, pain medicine and max dose gabapentin. Doing PT. Did not keep his appointment for [facility] as his neck is doing better. Has not followed up with physical therapy. Had a new referral to pain management, but has not heard from them yet- we will check on it, he would prefer [facility]. Reiterated to patient that we do not do long term pain medicine and can only provide medication for 6 months total. Call with any concerns.

## 2019-09-08 NOTE — Telephone Encounter (Signed)
Prior authorization needed for HYDROcodone-acetaminophen (NORCO) 10-325 MG tablet  [id number] - [place], [place] - [address]  [address] [place] [address]  Phone: [phone] Fax: [phone]

## 2019-09-08 NOTE — Telephone Encounter (Signed)
Prior Authorization initiated via [employer] for Hydrocodone Confirmation #: [id number]  Will monitor PA for status

## 2019-09-08 NOTE — Progress Notes (Signed)
BP (!) 138/93   Pulse 71   Temp 98.2 F (36.8 C) (Oral)   Ht 6' (1.829 m)   Wt 185 lb (83.9 kg)   SpO2 96%   BMI 25.09 kg/m    Subjective:    Patient ID: Johnny Jewett., male    DOB: February 06, 1971, 48 y.o.   MRN: [id number]  HPI: Johnny Kero. is a 48 y.o. male  Chief Complaint  Patient presents with  . Pain    hydrocodone refill   CHRONIC PAIN- did not keep his appointment with PT. Has not seen them since last visit.  He stopped going to PT because he notes that his pain in his neck is better. He has been doing stretches, but they don't seem like they are helping.  Present dose: 40  Morphine equivalents Pain control status: uncontrolled Duration: chronic Location: low back, R buttock into the back of his leg to his ankle Quality: sharp, aching, shooting and stabbing Current Pain Level: moderate Previous Pain Level: severe Breakthrough pain: yes Benefit from narcotic medications: yes What Activities task can be accomplished with current medication? Able to do his ADLs, care for himself Interested in weaning off narcotics:no   Stool softners/OTC fiber: no  Previous pain specialty evaluation: yes Non-narcotic analgesic meds: yes Narcotic contract: no- only short term medications  Relevant past medical, surgical, family and social history reviewed and updated as indicated. Interim medical history since our last visit reviewed. Allergies and medications reviewed and updated.  Review of Systems  Constitutional: Negative.   Respiratory: Negative.   Cardiovascular: Negative.   Musculoskeletal: Positive for arthralgias, back pain, gait problem and myalgias. Negative for joint swelling, neck pain and neck stiffness.  Skin: Negative.   Neurological: Positive for numbness. Negative for dizziness, tremors, seizures, syncope, facial asymmetry, speech difficulty, weakness, light-headedness and headaches.  Psychiatric/Behavioral: Negative.     Per HPI unless  specifically indicated above     Objective:    BP (!) 138/93   Pulse 71   Temp 98.2 F (36.8 C) (Oral)   Ht 6' (1.829 m)   Wt 185 lb (83.9 kg)   SpO2 96%   BMI 25.09 kg/m   Wt Readings from Last 3 Encounters:  09/08/19 185 lb (83.9 kg)  08/12/19 190 lb (86.2 kg)  07/17/19 180 lb (81.6 kg)    Physical Exam Vitals signs and nursing note reviewed.  Constitutional:      General: He is not in acute distress.    Appearance: Normal appearance. He is not ill-appearing, toxic-appearing or diaphoretic.  HENT:     Head: Normocephalic and atraumatic.     Right Ear: External ear normal.     Left Ear: External ear normal.     Nose: Nose normal.     Mouth/Throat:     Mouth: Mucous membranes are moist.     Pharynx: Oropharynx is clear.  Eyes:     General: No scleral icterus.       Right eye: No discharge.        Left eye: No discharge.     Extraocular Movements: Extraocular movements intact.     Conjunctiva/sclera: Conjunctivae normal.     Pupils: Pupils are equal, round, and reactive to light.  Neck:     Musculoskeletal: Normal range of motion and neck supple.  Cardiovascular:     Rate and Rhythm: Normal rate and regular rhythm.     Pulses: Normal pulses.     Heart sounds: Normal  heart sounds. No murmur. No friction rub. No gallop.   Pulmonary:     Effort: Pulmonary effort is normal. No respiratory distress.     Breath sounds: Normal breath sounds. No stridor. No wheezing, rhonchi or rales.  Chest:     Chest wall: No tenderness.  Musculoskeletal: Normal range of motion.  Skin:    General: Skin is warm and dry.     Capillary Refill: Capillary refill takes less than 2 seconds.     Coloration: Skin is not jaundiced or pale.     Findings: No bruising, erythema, lesion or rash.  Neurological:     General: No focal deficit present.     Mental Status: He is alert and oriented to person, place, and time. Mental status is at baseline.  Psychiatric:        Mood and Affect: Mood  normal.        Behavior: Behavior normal.        Thought Content: Thought content normal.        Judgment: Judgment normal.     Results for orders placed or performed during the hospital encounter of 04/01/19  CBC  Result Value Ref Range   WBC 4.7 4.0 - 10.5 K/uL   RBC 4.93 4.22 - 5.81 MIL/uL   Hemoglobin 15.8 13.0 - 17.0 g/dL   HCT 45.8 39.0 - 52.0 %   MCV 92.9 80.0 - 100.0 fL   MCH 32.0 26.0 - 34.0 pg   MCHC 34.5 30.0 - 36.0 g/dL   RDW 13.1 11.5 - 15.5 %   Platelets 219 150 - 400 K/uL   nRBC 0.0 0.0 - 0.2 %  Troponin I - Once  Result Value Ref Range   Troponin I <0.03 <0.03 ng/mL  Comprehensive metabolic panel  Result Value Ref Range   Sodium 141 135 - 145 mmol/L   Potassium 3.4 (L) 3.5 - 5.1 mmol/L   Chloride 106 98 - 111 mmol/L   CO2 24 22 - 32 mmol/L   Glucose, Bld 133 (H) 70 - 99 mg/dL   BUN 10 6 - 20 mg/dL   Creatinine, Ser 1.00 0.61 - 1.24 mg/dL   Calcium 8.7 (L) 8.9 - 10.3 mg/dL   Total Protein 6.6 6.5 - 8.1 g/dL   Albumin 3.8 3.5 - 5.0 g/dL   AST 21 15 - 41 U/L   ALT 16 0 - 44 U/L   Alkaline Phosphatase 72 38 - 126 U/L   Total Bilirubin 0.7 0.3 - 1.2 mg/dL   GFR calc non Af Amer >60 >60 mL/min   GFR calc Af Amer >60 >60 mL/min   Anion gap 11 5 - 15  Fibrin derivatives D-Dimer ([facility] only)  Result Value Ref Range   Fibrin derivatives D-dimer ([facility]) 289.47 0.00 - 499.00 ng/mL (FEU)  Lipase, blood  Result Value Ref Range   Lipase 30 11 - 51 U/L      Assessment & Plan:   Problem List Items Addressed This Visit      Nervous and Auditory   Lumbar radiculopathy    Acting up again. Doesn't remember what he did this time. On muscle relaxer, pain medicine and max dose gabapentin. Doing PT. Did not keep his appointment for [facility] as his neck is doing better. Has not followed up with physical therapy. Had a new referral to pain management, but has not heard from them yet- we will check on it, he would prefer [facility]. Reiterated to patient that we do not do long  term pain  medicine and can only provide medication for 6 months total. Call with any concerns.        Other Visit Diagnoses    Right sided sciatica    -  Primary   Already on gabapentin, baclofen and pain medicine. Will get him back into PT. Referral generated today. Call with any concerns.    Relevant Orders   Ambulatory referral to Physical Therapy       Follow up plan: Return in about 4 weeks (around 10/06/2019).

## 2019-09-09 NOTE — Telephone Encounter (Signed)
Spoke with Alinda Dooms, Team Lead at [facility] and she is working on the patients referral.  She also informed the [facility] staff that the preferred method of communication will be utilizing the [facility] pool going forward.  Thanks

## 2019-09-12 ENCOUNTER — Other Ambulatory Visit: Payer: Self-pay

## 2019-09-12 ENCOUNTER — Emergency Department
Admission: EM | Admit: 2019-09-12 | Discharge: 2019-09-12 | Disposition: A | Discharge status: Home / Self Care | Payer: Medicaid Other | Attending: Emergency Medicine | Admitting: Emergency Medicine

## 2019-09-12 ENCOUNTER — Telehealth: Payer: Self-pay | Admitting: Family Medicine

## 2019-09-12 ENCOUNTER — Encounter: Payer: Self-pay | Admitting: Emergency Medicine

## 2019-09-12 DIAGNOSIS — R202 Paresthesia of skin: Secondary | ICD-10-CM | POA: Insufficient documentation

## 2019-09-12 DIAGNOSIS — M79604 Pain in right leg: Secondary | ICD-10-CM | POA: Insufficient documentation

## 2019-09-12 DIAGNOSIS — F17210 Nicotine dependence, cigarettes, uncomplicated: Secondary | ICD-10-CM | POA: Insufficient documentation

## 2019-09-12 DIAGNOSIS — M545 Low back pain: Secondary | ICD-10-CM | POA: Diagnosis present

## 2019-09-12 DIAGNOSIS — G629 Polyneuropathy, unspecified: Secondary | ICD-10-CM

## 2019-09-12 DIAGNOSIS — Z96651 Presence of right artificial knee joint: Secondary | ICD-10-CM | POA: Diagnosis not present

## 2019-09-12 DIAGNOSIS — M5431 Sciatica, right side: Secondary | ICD-10-CM

## 2019-09-12 LAB — BASIC METABOLIC PANEL
Anion gap: 10 (ref 5–15)
BUN: 15 mg/dL (ref 6–20)
CO2: 24 mmol/L (ref 22–32)
Calcium: 8.9 mg/dL (ref 8.9–10.3)
Chloride: 104 mmol/L (ref 98–111)
Creatinine, Ser: 1.06 mg/dL (ref 0.61–1.24)
GFR calc Af Amer: >60 mL/min (ref >60)
GFR calc non Af Amer: >60 mL/min (ref >60)
Glucose, Bld: 114 mg/dL — ABNORMAL HIGH (ref 70–99)
Potassium: 3.3 mmol/L — ABNORMAL LOW (ref 3.5–5.1)
Sodium: 138 mmol/L (ref 135–145)

## 2019-09-12 LAB — CBC
HCT: 47.9 % (ref 39.0–52.0)
Hemoglobin: 16.6 g/dL (ref 13.0–17.0)
MCH: 31.8 pg (ref 26.0–34.0)
MCHC: 34.7 g/dL (ref 30.0–36.0)
MCV: 91.8 fL (ref 80.0–100.0)
Platelets: 222 10*3/uL (ref 150–400)
RBC: 5.22 MIL/uL (ref 4.22–5.81)
RDW: 12.7 % (ref 11.5–15.5)
WBC: 7.6 10*3/uL (ref 4.0–10.5)
nRBC: 0 % (ref 0.0–0.2)

## 2019-09-12 LAB — TROPONIN I (HIGH SENSITIVITY): Troponin I (High Sensitivity): 4 ng/L (ref <18)

## 2019-09-12 MED ORDER — OXYCODONE HCL 5 MG PO TABS: 5.0000 mg | ORAL_TABLET | Freq: Four times a day (QID) | ORAL | 0 refills | Status: DC | PRN

## 2019-09-12 MED ORDER — KETOROLAC TROMETHAMINE 60 MG/2ML IM SOLN
60.0000 mg | Freq: Once | INTRAMUSCULAR | Status: AC
  Administered 2019-09-12: 21:00:00 60 mg via INTRAMUSCULAR
  Filled 2019-09-12: qty 2

## 2019-09-12 MED ORDER — METHOCARBAMOL 500 MG PO TABS: 500.0000 mg | ORAL_TABLET | Freq: Three times a day (TID) | ORAL | 0 refills | Status: DC | PRN

## 2019-09-12 MED ORDER — DEXAMETHASONE SODIUM PHOSPHATE 10 MG/ML IJ SOLN
10.0000 mg | Freq: Once | INTRAMUSCULAR | Status: AC
  Administered 2019-09-12: 10 mg via INTRAMUSCULAR
  Filled 2019-09-12: qty 1

## 2019-09-12 MED ORDER — PREDNISONE 10 MG (21) PO TBPK: ORAL_TABLET | ORAL | 0 refills | Status: DC

## 2019-09-12 MED ORDER — HYDROMORPHONE HCL 1 MG/ML IJ SOLN
1.0000 mg | Freq: Once | INTRAMUSCULAR | Status: AC
  Administered 2019-09-12: 1 mg via INTRAMUSCULAR
  Filled 2019-09-12 (×2): qty 1

## 2019-09-12 NOTE — ED Notes (Signed)
Pt calling someone to pick him up

## 2019-09-12 NOTE — ED Notes (Signed)
Pt states that he has numbness in his right index finger and in both legs.

## 2019-09-12 NOTE — ED Provider Notes (Signed)
[facility] Emergency Department Provider Note  ____________________________________________   First MD Initiated Contact with Patient 09/12/19 1944     (approximate)  I have reviewed the triage vital signs and the nursing notes.   HISTORY  Chief Complaint Leg Pain, Back Pain, and Weakness    HPI Johnny [name]. is a 48 y.o. male with past medical history as below including significant degenerative disc disease in the neck and back here with right arm and right leg tingling.  Patient states that his symptoms started approximately 10 to 12 days ago.  He states he began to develop tingling, burning sensation in his right arm.  This is similar to the radiculopathy he had prior to his cervical surgery.  He reports that this resolved, but then he began to develop a stabbing, burning, shooting, electrical type pain in his right buttocks that radiates down his right leg.  This pain is worse with bending forward and lifting his leg.  Denies any weakness.  He does not feel occasional numbness throughout his back of his right leg.  The symptoms are new.  He has seen his doctor for this and was for to PT but states enzymes worsen so he is here for evaluation.  He is been taking hydrocodone with minimal relief.  Denies any recent fevers, chills.  No recent trauma.  No recent falls.  He does note that he began smoking a different kind of cigarette and has been coughing more frequently.  No shortness of breath.        Past Medical History:  Diagnosis Date  . Arthritis    hands, knees, lower back  . Bilateral sciatica   . Carpal tunnel syndrome   . Chronic back pain   . Chronic neck pain   . Chronic pain syndrome   . GERD (gastroesophageal reflux disease)   . Hand joint pain 07/02/2013  . Herniated disc   . Traumatic amputation of left thumb 2013  . Wears dentures    full upper and lower    Patient Active Problem List   Diagnosis Date Noted  . S/P cervical  spinal fusion (C5-C6 ACDF) 07/15/2019  . Cervical radicular pain 07/15/2019  . Cervical facet joint syndrome 07/15/2019  . Degeneration of lumbar intervertebral disc 07/03/2019  . Lumbar radiculopathy 07/03/2019  . Cervical spondylosis 04/17/2019  . Homeless single person 04/17/2019  . History of total knee replacement, right 04/25/2018  . Pancreatic mass 01/14/2018  . Tobacco abuse 01/14/2018  . Chronic neck pain   . Benign neoplasm of descending colon   . Benign neoplasm of ascending colon   . Intractable vomiting with nausea   . Reflux esophagitis   . Duodenal ulcer without hemorrhage or perforation   . Chronic pain syndrome 08/07/2017  . Vitamin D deficiency 06/20/2017  . Insomnia 06/19/2017  . Presence of artificial knee joint 01/17/2013  . Knee pain 01/17/2013  . 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 AMPUTATION FINGER / THUMB Left 2013  . COLONOSCOPY WITH PROPOFOL N/A 01/03/2018   Procedure: COLONOSCOPY WITH PROPOFOL;  Surgeon: [name], MD;  Location: [place];  Service: Endoscopy;  Laterality: N/A;  . ESOPHAGOGASTRODUODENOSCOPY (EGD) WITH PROPOFOL N/A 01/03/2018   Procedure: ESOPHAGOGASTRODUODENOSCOPY (EGD) WITH PROPOFOL;  Surgeon: [name], MD;  Location: [place];  Service: Endoscopy;  Laterality: N/A;  . HERNIA REPAIR    .  HIP SURGERY    . JOINT REPLACEMENT Right    knee  . KNEE SURGERY    . neck fusion    . POLYPECTOMY N/A 01/03/2018   Procedure: POLYPECTOMY;  Surgeon: [name], MD;  Location: [place];  Service: Endoscopy;  Laterality: N/A;  . REPLACEMENT TOTAL KNEE Right   . SHOULDER SURGERY Right 2016 X 2  . SPINAL FUSION    . TONSILLECTOMY      Prior to Admission medications   Medication Sig Start Date End Date Taking? Authorizing Provider  baclofen (LIORESAL) 10 MG tablet Take 1 tablet (10 mg total) by mouth 2  (two) times daily. 07/03/19   Johnson, Megan P, DO  gabapentin (NEURONTIN) 600 MG tablet Take 2 tablets (1,200 mg total) by mouth 3 (three) times daily. 07/31/19   Johnson, Megan P, DO  HYDROcodone-acetaminophen (NORCO) 10-325 MG tablet Take 1 tablet by mouth every 6 (six) hours as needed for up to 28 days. 09/08/19 10/06/19  Park Liter P, DO  ibuprofen (ADVIL) 200 MG tablet Take by mouth.    [provider]    Allergies Otho Darner allergy], Flexeril [cyclobenzaprine], Codeine, Latex, Penicillins, Sulfamethoxazole-trimethoprim, and Tramadol  Family History  Problem Relation Age of Onset  . Cancer Father        sarcoma  . Cirrhosis Paternal Grandmother   . Cancer Paternal Grandfather        Pancreatic  . Fibromyalgia Sister   . Deafness Sister     Social History Social History   Tobacco Use  . Smoking status: Current Every Day Smoker    Packs/day: 0.50    Years: 25.00    Pack years: 12.50    Types: Cigarettes  . Smokeless tobacco: Never Used  Substance Use Topics  . Alcohol use: No  . Drug use: No    Review of Systems  Review of Systems  Constitutional: Negative for chills, fatigue and fever.  HENT: Negative for sore throat.   Respiratory: Negative for shortness of breath.   Cardiovascular: Negative for chest pain.  Gastrointestinal: Negative for abdominal pain.  Genitourinary: Negative for flank pain.  Musculoskeletal: Positive for back pain. Negative for neck pain.  Skin: Negative for rash and wound.  Allergic/Immunologic: Negative for immunocompromised state.  Neurological: Positive for weakness and numbness.  Hematological: Does not bruise/bleed easily.  All other systems reviewed and are negative.    ____________________________________________  PHYSICAL EXAM:      VITAL SIGNS: ED Triage Vitals  Enc Vitals Group     BP 09/12/19 1603 (!) 164/123     Pulse Rate 09/12/19 1603 78     Resp 09/12/19 1603 18     Temp 09/12/19 1603 98.4 F (36.9  C)     Temp Source 09/12/19 1603 Oral     SpO2 09/12/19 1603 98 %     Weight 09/12/19 1602 190 lb (86.2 kg)     Height 09/12/19 1602 6' (1.829 m)     Head Circumference --      Peak Flow --      Pain Score 09/12/19 1602 8     Pain Loc --      Pain Edu? --      Excl. in [place]? --      Physical Exam Vitals signs and nursing note reviewed.  Constitutional:      General: He is not in acute distress.    Appearance: He is well-developed.  HENT:     Head: Normocephalic and atraumatic.  Eyes:     Conjunctiva/sclera: Conjunctivae normal.  Neck:     Musculoskeletal: Neck supple.  Cardiovascular:     Rate and Rhythm: Normal rate and regular rhythm.     Heart sounds: Normal heart sounds.  Pulmonary:     Effort: Pulmonary effort is normal. No respiratory distress.     Breath sounds: No wheezing.  Abdominal:     General: There is no distension.  Skin:    General: Skin is warm.     Capillary Refill: Capillary refill takes less than 2 seconds.     Findings: No rash.  Neurological:     Mental Status: He is alert and oriented to person, place, and time.     Motor: No abnormal muscle tone.     Comments: Neurological Exam:  Mental Status: Alert and oriented to person, place, and time. Attention and concentration normal. Speech clear. Recent memory is intact. Cranial Nerves: Visual fields grossly intact. EOMI and PERRLA. No nystagmus noted. Facial sensation intact at forehead, maxillary cheek, and chin/mandible bilaterally. No facial asymmetry or weakness. Hearing grossly normal. Uvula is midline, and palate elevates symmetrically. Normal SCM and trapezius strength. Tongue midline without fasciculations. Motor: Muscle strength 5/5 in proximal and distal UE and LE bilaterally. No pronator drift. Muscle tone normal. Reflexes: 2+ and symmetrical in all four extremities.  Sensation: Intact to light touch in upper and lower extremities distally bilaterally.  Gait: Normal without ataxia.  Coordination: Normal FTN bilaterally.        Spine Exam: Inspection/Palpation: No midline TTP. Marked R paraspinal lumbosacral TTP over sciatic nerve Strength: 5/5 throughout LE bilaterally (hip flexion/extension, adduction/abduction; knee flexion/extension; foot dorsiflexion/plantarflexion, inversion/eversion; great toe inversion) Sensation: Intact to light touch in proximal and distal LE bilaterally Reflexes: 2+ quadriceps and achilles reflexes  ____________________________________________   LABS (all labs ordered are listed, but only abnormal results are displayed)  Labs Reviewed  BASIC METABOLIC PANEL - Abnormal; Notable for the following components:      Result Value   Potassium 3.3 (*)    Glucose, Bld 114 (*)    All other components within normal limits  CBC  URINALYSIS, COMPLETE (UACMP) WITH MICROSCOPIC  TROPONIN I (HIGH SENSITIVITY)  TROPONIN I (HIGH SENSITIVITY)    ____________________________________________  EKG: Normal sinus rhythm, ventricular rate 75.  PR 142, QRS 80, QTc 444.  No acute ST or T-segment changes.  No ischemia. ________________________________________  RADIOLOGY All imaging, including plain films, CT scans, and ultrasounds, independently reviewed by me, and interpretations confirmed via formal radiology reads.  ED MD interpretation:   None  Official radiology report(s): No results found.  ____________________________________________  PROCEDURES   Procedure(s) performed (including Critical Care):  Procedures  ____________________________________________  INITIAL IMPRESSION / MDM / [place] / ED COURSE  As part of my medical decision making, I reviewed the following data within the [place]. was evaluated in Emergency Department on 09/12/2019 for the symptoms described in the history of present illness. He was evaluated in the context of the global COVID-19 pandemic, which  necessitated consideration that the patient might be at risk for infection with the SARS-CoV-2 virus that causes COVID-19. Institutional protocols and algorithms that pertain to the evaluation of patients at risk for COVID-19 are in a state of rapid change based on information released by regulatory bodies including the CDC and federal and state organizations. These policies and algorithms were followed during the patient's care in  the ED.  Some ED evaluations and interventions may be delayed as a result of limited staffing during the pandemic.*      Medical Decision Making: 48 year old male here with significant posterior RLE pain. On exam, pt has +leg raise, reproducible R posterior leg TTP consistent with sciatica. He has known, significant degenerative cervical and lumbar disc disease and his sx started with increased coughing, which has likely exacerbated known disc disease. No fever, chills ,weight loss, h/o IVDU or sx to suggest malignancy or infectious process. His neuro exam is unremarkable with fully intact strength and sensation. He is markedly improved with analgesics here. Will trial steroids, have him f/u with his PCP who has already been treating him for this. Return precautions given. No loss of bowel or bladder continence, no signs to suggest cauda equina.  ____________________________________________  FINAL CLINICAL IMPRESSION(S) / ED DIAGNOSES  Final diagnoses:  None     MEDICATIONS GIVEN DURING THIS VISIT:  Medications - No data to display   ED Discharge Orders    None       Note:  This document was prepared using Dragon voice recognition software and may include unintentional dictation errors.   Duffy Bruce, MD 09/12/19 2230

## 2019-09-12 NOTE — Telephone Encounter (Signed)
Copied from [place] [phone]. Topic: General - Other >> Sep 12, 2019 11:41 AM Pauline Good wrote: Reason for CRM: pt saw the doctor on Monday for right leg pain and is taking hydrocodone and pt stated now he can't walk and the meds isn't doing any good. Please advise. >> Sep 12, 2019  1:29 PM Jill Side wrote: Spoke with Dr Wynetta Emery she informed me to let the pt know that he needs to go to the ER. Called pt and informed him of Dr. Durenda Age statement, he said that he will go to ER.

## 2019-09-12 NOTE — ED Notes (Signed)
Pt states that about a month ago he began to have pain in his back, neck and right arm. States he saw his primary care doctor and they told him that he had a pinched nerve. States that the pain subsided but this week he began to experience pain is his right buttock and down the back of his right leg. Describes the pain as a shocking cramping pain.

## 2019-09-12 NOTE — ED Triage Notes (Addendum)
Pt states hx of right arm shoulder pain that went down arm that has now resolved. Pt now complains of pain on right side of lower back which radiates down right leg x2 weeks. Pt describes pain as 'crampy' feeling and numbness to where his right foot will go asleep. Pt states if he 'bends down or over that his leg feels like it is being stretched too far'.   Denies cp, sob, & fevers at this time. Pt denies headache & blurred vision. Pt also states 3 knee surgeries to the right knee. Pt states he saw his PCP and was referred to PT originally, and then referred him to ER today on phone.   Mini negative neuro performed.

## 2019-09-15 ENCOUNTER — Ambulatory Visit: Payer: Medicaid Other | Admitting: Family Medicine

## 2019-09-15 ENCOUNTER — Other Ambulatory Visit: Payer: Self-pay

## 2019-09-15 ENCOUNTER — Encounter: Payer: Self-pay | Admitting: Family Medicine

## 2019-09-15 ENCOUNTER — Ambulatory Visit (INDEPENDENT_AMBULATORY_CARE_PROVIDER_SITE_OTHER): Payer: Medicaid Other | Admitting: Family Medicine

## 2019-09-15 VITALS — BP 135/94 | HR 68 | Temp 98.4°F | Ht 72.0 in | Wt 192.0 lb

## 2019-09-15 DIAGNOSIS — M5431 Sciatica, right side: Secondary | ICD-10-CM | POA: Diagnosis not present

## 2019-09-15 MED ORDER — METHOCARBAMOL 500 MG PO TABS: 500.0000 mg | ORAL_TABLET | Freq: Three times a day (TID) | ORAL | 1 refills | Status: DC | PRN

## 2019-09-15 MED ORDER — HYDROCODONE-ACETAMINOPHEN 10-325 MG PO TABS: 1.0000 | ORAL_TABLET | Freq: Four times a day (QID) | ORAL | 0 refills | Status: AC | PRN

## 2019-09-15 NOTE — Assessment & Plan Note (Signed)
Not under good control. Discussed with Johnny [name] that I am not comfortable increasing his pain medication. He has his hydrocodone at home. Offered toradol shot and to extend his prednisone. He does not want to do that. He has a physical therapy appointment on Wednesday. We checked on his appointment with pain management with [facility]- they just got his paperwork and are in the process of reviewing it. Both [place] and [place] should hear in 2-3 weeks regarding an appointment. Urgent referral to orthopedics made today.

## 2019-09-15 NOTE — Progress Notes (Signed)
BP (!) 135/94   Pulse 68   Temp 98.4 F (36.9 C) (Oral)   Ht 6' (1.829 m)   Wt 192 lb (87.1 kg)   SpO2 99%   BMI 26.04 kg/m    Subjective:    Patient ID: Johnny [name]., male    DOB: April 15, 1971, 48 y.o.   MRN: [id number]  HPI: Johnny [name]. is a 48 y.o. male  Chief Complaint  Patient presents with  . Hospitalization Follow-up   ER FOLLOW UP- patient called her stating he could not get up or walk- given weakness, advised him to go to the ER. Denied any weakness at the ER. No imaging done.  Time since discharge: 2 days Hospital/facility: [facility] Diagnosis: R sciatica Procedures/tests: No imaging, normal labs Consultants: None New medications: prednisone, methocarbamol (already on baclofen), oxycodone 5mg  #10 Discharge instructions:  Follow up here Status: stable   Cassius states that he started feeling worse on Friday with worse pain. He notes that he was dragging his leg when he went into the ER. He notes that they changed his medicine to methocarbamol and feels like it is helping better. They also gave him oxycodone. He states that his hydrocodone is not helping significantly. He feels like the oxycodone is helping more. He states that he is doing better on the oxycodone, but states that he continues to be in significant pain.   Relevant past medical, surgical, family and social history reviewed and updated as indicated. Interim medical history since our last visit reviewed. Allergies and medications reviewed and updated.  Review of Systems  Constitutional: Negative.   Respiratory: Negative.   Cardiovascular: Negative.   Gastrointestinal: Negative.   Musculoskeletal: Positive for back pain, gait problem and myalgias. Negative for arthralgias, joint swelling, neck pain and neck stiffness.  Skin: Negative.   Neurological: Positive for weakness and numbness. Negative for dizziness, tremors, seizures, syncope, facial asymmetry, speech difficulty, light-headedness  and headaches.  Psychiatric/Behavioral: Negative.     Per HPI unless specifically indicated above     Objective:    BP (!) 135/94   Pulse 68   Temp 98.4 F (36.9 C) (Oral)   Ht 6' (1.829 m)   Wt 192 lb (87.1 kg)   SpO2 99%   BMI 26.04 kg/m   Wt Readings from Last 3 Encounters:  09/15/19 192 lb (87.1 kg)  09/12/19 190 lb (86.2 kg)  09/08/19 185 lb (83.9 kg)    Physical Exam Constitutional:      General: He is not in acute distress.    Appearance: He is well-developed.  HENT:     Head: Normocephalic and atraumatic.     Right Ear: Hearing normal.     Left Ear: Hearing normal.     Nose: Nose normal.  Eyes:     General: Lids are normal. No scleral icterus.       Right eye: No discharge.        Left eye: No discharge.     Conjunctiva/sclera: Conjunctivae normal.  Pulmonary:     Effort: Pulmonary effort is normal. No respiratory distress.  Musculoskeletal: Normal range of motion.  Skin:    Findings: No rash.  Neurological:     Mental Status: He is alert and oriented to person, place, and time.  Psychiatric:        Speech: Speech normal.        Behavior: Behavior normal.        Thought Content: Thought content normal.  Judgment: Judgment normal.     Results for orders placed or performed during the hospital encounter of [id number]  Basic metabolic panel  Result Value Ref Range   Sodium 138 135 - 145 mmol/L   Potassium 3.3 (L) 3.5 - 5.1 mmol/L   Chloride 104 98 - 111 mmol/L   CO2 24 22 - 32 mmol/L   Glucose, Bld 114 (H) 70 - 99 mg/dL   BUN 15 6 - 20 mg/dL   Creatinine, Ser 1.06 0.61 - 1.24 mg/dL   Calcium 8.9 8.9 - 10.3 mg/dL   GFR calc non Af Amer >60 >60 mL/min   GFR calc Af Amer >60 >60 mL/min   Anion gap 10 5 - 15  CBC  Result Value Ref Range   WBC 7.6 4.0 - 10.5 K/uL   RBC 5.22 4.22 - 5.81 MIL/uL   Hemoglobin 16.6 13.0 - 17.0 g/dL   HCT 47.9 39.0 - 52.0 %   MCV 91.8 80.0 - 100.0 fL   MCH 31.8 26.0 - 34.0 pg   MCHC 34.7 30.0 - 36.0 g/dL   RDW  12.7 11.5 - 15.5 %   Platelets 222 150 - 400 K/uL   nRBC 0.0 0.0 - 0.2 %  Troponin I (High Sensitivity)  Result Value Ref Range   Troponin I (High Sensitivity) 4 <18 ng/L      Assessment & Plan:   Problem List Items Addressed This Visit      Nervous and Auditory   Right sided sciatica - Primary    Not under good control. Discussed with Diandre that I am not comfortable increasing his pain medication. He has his hydrocodone at home. Offered toradol shot and to extend his prednisone. He does not want to do that. He has a physical therapy appointment on Wednesday. We checked on his appointment with pain management with [facility]- they just got his paperwork and are in the process of reviewing it. Both [place] and [place] should hear in 2-3 weeks regarding an appointment. Urgent referral to orthopedics made today.      Relevant Medications   methocarbamol (ROBAXIN) 500 MG tablet   Other Relevant Orders   Ambulatory referral to Orthopedic Surgery       Follow up plan: Return As scheduled.

## 2019-09-17 ENCOUNTER — Ambulatory Visit: Payer: Medicaid Other

## 2019-09-18 ENCOUNTER — Telehealth: Payer: Self-pay | Admitting: Student in an Organized Health Care Education/Training Program

## 2019-09-18 NOTE — Telephone Encounter (Signed)
We received a referral from Wyatt Portela, PA-C for back pain. Patient has been to see Dr. Holley Raring. He did not come in for procedure appts scheduled, says they upped his gabapentin and it was helping but now he is having increased sciatica pain. I scheduled patient for In Office (patient request) on 10-08-19 at 1:15

## 2019-09-22 ENCOUNTER — Ambulatory Visit: Payer: Medicaid Other | Admitting: Licensed Clinical Social Worker

## 2019-09-22 DIAGNOSIS — G8929 Other chronic pain: Secondary | ICD-10-CM

## 2019-09-22 DIAGNOSIS — M5136 Other intervertebral disc degeneration, lumbar region: Secondary | ICD-10-CM

## 2019-09-22 DIAGNOSIS — M542 Cervicalgia: Secondary | ICD-10-CM

## 2019-09-22 NOTE — Chronic Care Management (AMB) (Signed)
  Care Management   Follow Up Note   09/22/2019 Name: Johnny [name]. MRN: [id number] DOB: 03/30/1971  Referred by: Valerie Roys, DO Reason for referral : Care Coordination   [name] Deterrius Heins. is a 48 y.o. year old male who is a primary care patient of Valerie Roys, DO. The care management team was consulted for assistance with care management and care coordination needs.    Review of patient status, including review of consultants reports, relevant laboratory and other test results, and collaboration with appropriate care team members and the patient's provider was performed as part of comprehensive patient evaluation and provision of chronic care management services.    SDOH (Social Determinants of Health) screening performed today: Stress. See Care Plan for related entries.   Goals Addressed    . "I need safe and stable housing" (pt-stated)       Current Barriers:  . Financial constraints . Limited social support . Limited access to food . Housing barriers . Limited education about housing resources available during COVID-19*  Clinical Social Work Clinical Goal(s):  Marland Kitchen Over the next 90 days, client will work with SW to address concerns related to lack of stable housing. . Over the next 90 days, client will follow up with crisis support resources * as directed by SW  Interventions: . Patient interviewed and appropriate assessments performed . LCSW sent text message to patient with a list of financial support and housing resources for him to refer back to whenever needed . Provided patient with information about housing resources available during [place]-  . Patient reports that he successfully moved into a new boarding house about 4 months ago. He reports that this boarding house is actually very nice and updated. He also reports that there are two other male roommates living there with him but one causes conflict. He shares that one roommate often has arguments  with his girlfriend which causes a disturbance.  . Advised patient to contact CCM team directly if any urgent needs present . Assisted patient/caregiver with obtaining information about health plan benefits . Patient reports ongoing chronic pain- to the point where he cannot lay down, sit down or stand up without experience excruciating pain. Patient wishes to discuss symptom management with his PCP during his next appointment on 10/08/2019. Patient is open to considering Home Health options.   Patient Self Care Activities:  . Attends all scheduled provider appointments . Calls provider office for new concerns or questions  Please see past updates related to this goal by clicking on the "Past Updates" button in the selected goal      The patient has been provided with contact information for the care management team and has been advised to call with any health related questions or concerns.   [name], BSW, [name], [facility] [email or web address] Phone: [phone]

## 2019-09-23 ENCOUNTER — Telehealth: Payer: Self-pay | Admitting: Family Medicine

## 2019-09-23 NOTE — Telephone Encounter (Signed)
Routing to provider  

## 2019-09-23 NOTE — Telephone Encounter (Signed)
Too soon for refill. Medication cannot be refilled until 10/06/2019.

## 2019-09-23 NOTE — Telephone Encounter (Signed)
rx refill HYDROcodone-acetaminophen (NORCO) 10-325 MG tablet PHARMACY [id number] - [place], [place] - [address] [phone] (Phone) [phone] (Fax)

## 2019-10-06 ENCOUNTER — Ambulatory Visit: Payer: Medicaid Other

## 2019-10-07 ENCOUNTER — Other Ambulatory Visit: Payer: Self-pay

## 2019-10-07 ENCOUNTER — Ambulatory Visit
Payer: Medicaid Other | Attending: Student in an Organized Health Care Education/Training Program | Admitting: Student in an Organized Health Care Education/Training Program

## 2019-10-07 ENCOUNTER — Telehealth: Payer: Self-pay | Admitting: Family Medicine

## 2019-10-07 ENCOUNTER — Encounter: Payer: Self-pay | Admitting: Student in an Organized Health Care Education/Training Program

## 2019-10-07 VITALS — BP 174/102 | HR 67 | Temp 98.6°F | Resp 18 | Ht 72.0 in | Wt 190.0 lb

## 2019-10-07 DIAGNOSIS — M5442 Lumbago with sciatica, left side: Secondary | ICD-10-CM | POA: Diagnosis not present

## 2019-10-07 DIAGNOSIS — M5416 Radiculopathy, lumbar region: Secondary | ICD-10-CM | POA: Diagnosis not present

## 2019-10-07 DIAGNOSIS — G8929 Other chronic pain: Secondary | ICD-10-CM | POA: Diagnosis present

## 2019-10-07 DIAGNOSIS — G894 Chronic pain syndrome: Secondary | ICD-10-CM

## 2019-10-07 DIAGNOSIS — M5441 Lumbago with sciatica, right side: Secondary | ICD-10-CM

## 2019-10-07 NOTE — Progress Notes (Signed)
Safety precautions to be maintained throughout the outpatient stay will include: orient to surroundings, keep bed in low position, maintain call bell within reach at all times, provide assistance with transfer out of bed and ambulation.  

## 2019-10-07 NOTE — Telephone Encounter (Signed)
Patient calling checking on referral

## 2019-10-07 NOTE — Progress Notes (Signed)
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 encounter of 10/09/18  DG Knee Complete 4 Views Right   Narrative CLINICAL DATA:  Right knee pain after fall on stairs 2 weeks ago.  EXAM: RIGHT KNEE - COMPLETE 4+ VIEW  COMPARISON:  Radiographs of November 01, 2017.  FINDINGS: Status post right total knee arthroplasty. The femoral and tibial components appear to be well situated.  No fracture or dislocation is noted. Soft tissues are unremarkable.  IMPRESSION: Status post right total knee arthroplasty. No acute abnormality seen in the right knee.   Electronically Signed   By: Marijo Conception, M.D.   On: 10/09/2018 10:32    Ankle  Imaging: Ankle-R DG Complete:  Results for orders placed during the hospital encounter of 11/01/17  DG Ankle Complete Right   Narrative CLINICAL DATA:  Right ankle pain, fell out of bed 2 days ago  EXAM: RIGHT ANKLE - COMPLETE 3+ VIEW  COMPARISON:  None.  FINDINGS: The right ankle joint appears normal. Alignment is normal. No fracture is seen. No significant soft tissue swelling is noted.  IMPRESSION: Negative.   Electronically Signed   By: Ivar Drape M.D.   On: 11/01/2017 16:52     Foot-L DG Complete:  Results for orders placed during the hospital encounter of 01/05/13  DG Foot Complete Left   Narrative *RADIOLOGY REPORT*  Clinical Data: Medial left foot pain and swelling secondary to blunt trauma.  LEFT FOOT - COMPLETE 3+ VIEW  Comparison: 11/06/2008  Findings: There is no fracture or dislocation or arthritis or other osseous abnormality.  Soft tissue swelling is present over the medial aspect of the midfoot.  IMPRESSION: Soft tissue swelling.  Otherwise negative.   Original Report Authenticated By: Lorriane Shire, M.D.     Hand Imaging: Hand-R DG Complete:  Results for orders placed during the hospital encounter of 08/24/17  DG Hand Complete Right   Narrative CLINICAL DATA:  Injury to the finger with swelling  EXAM: RIGHT HAND - COMPLETE 3+ VIEW  COMPARISON:  07/26/2017  FINDINGS: Old deformity of the fifth metacarpal. There appears to be a small tuft fracture of the third distal phalanx of uncertain age. No subluxation. No radiopaque foreign body.  IMPRESSION: 1. Old deformity of the right fifth metacarpal 2. Small tuft fracture of the third distal phalanx of uncertain age. Otherwise no acute osseous abnormality.   Electronically Signed   By: Donavan Foil M.D.   On: 08/23/2017 23:37    Complexity Note: Imaging results reviewed. Results shared with Mr. Cromie, using Layman's terms.                         Meds   Current Outpatient  Medications:  .  baclofen (LIORESAL) 10 MG tablet, Take 10 mg by mouth 2 (two) times daily., Disp: , Rfl:  .  gabapentin (NEURONTIN) 600 MG tablet, Take 2 tablets (1,200 mg total) by mouth 3 (three) times daily. (Patient taking differently: Take 1,400 mg by mouth 3 (three) times daily. ), Disp: 180 tablet, Rfl: 6 .  ibuprofen (ADVIL) 200 MG tablet, Take by mouth., Disp: , Rfl:  .  oxyCODONE (OXY IR/ROXICODONE) 5 MG immediate release tablet, Take 1 tablet (5 mg total) by mouth every 6 (six) hours as needed for breakthrough pain (Can take in addition to your usual medications for breakthrough pain)., Disp: 10 tablet, Rfl: 0 .  methocarbamol (ROBAXIN) 500 MG tablet, Take 1 tablet (500 mg total) by mouth every 8 (eight) hours as needed for muscle spasms. (Patient not taking: Reported on 10/07/2019), Disp: 90 tablet, Rfl: 1 .  predniSONE (STERAPRED UNI-PAK 21 TAB) 10 MG (21) TBPK tablet, Take as directed on package (Patient not taking: Reported on 10/07/2019), Disp: 21 each, Rfl: 0  ROS  Constitutional: Denies any fever or chills Gastrointestinal: No reported hemesis, hematochezia, vomiting, or acute GI distress Musculoskeletal: Denies  any acute onset joint swelling, redness, loss of ROM, or weakness Neurological: No reported episodes of acute onset apraxia, aphasia, dysarthria, agnosia, amnesia, paralysis, loss of coordination, or loss of consciousness  Allergies  Mr. Staron is allergic to crab [shellfish allergy]; flexeril [cyclobenzaprine]; codeine; latex; penicillins; sulfamethoxazole-trimethoprim; and tramadol.  PFSH  Drug: Mr. Face  reports no history of drug use. Alcohol:  reports no history of alcohol use. Tobacco:  reports that he has been smoking cigarettes. He has a 12.50 pack-year smoking history. He has never used smokeless tobacco. Medical:  has a past medical history of Arthritis, Bilateral sciatica, Carpal tunnel syndrome, Chronic back pain, Chronic neck pain, Chronic pain syndrome,  GERD (gastroesophageal reflux disease), Hand joint pain (07/02/2013), Herniated disc, Traumatic amputation of left thumb (2013), and Wears dentures. Surgical: Mr. Colee  has a past surgical history that includes neck fusion; Knee surgery; Hernia repair; Replacement total knee (Right); Amputation finger / thumb (Left, 2013); Tonsillectomy; Hip surgery; Spinal fusion; Joint replacement (Right); Shoulder surgery (Right, 2016 X 2); Colonoscopy with propofol (N/A, 01/03/2018); Esophagogastroduodenoscopy (egd) with propofol (N/A, 01/03/2018); and polypectomy (N/A, 01/03/2018). Family: family history includes Cancer in his father and paternal grandfather; Cirrhosis in his paternal grandmother; Deafness in his sister; Fibromyalgia in his sister.  Constitutional Exam  General appearance: Well nourished, well developed, and well hydrated. In no apparent acute distress Vitals:   10/07/19 0944  BP: (!) 174/102  Pulse: 67  Resp: 18  Temp: 98.6 F (37 C)  SpO2: 100%  Weight: 190 lb (86.2 kg)  Height: 6' (1.829 m)   BMI Assessment: Estimated body mass index is 25.77 kg/m as calculated from the following:   Height as of this encounter: 6' (1.829 m).   Weight as of this encounter: 190 lb (86.2 kg).  BMI interpretation table: BMI level Category Range association with higher incidence of chronic pain  <18 kg/m2 Underweight   18.5-24.9 kg/m2 Ideal body weight   25-29.9 kg/m2 Overweight Increased incidence by 20%  30-34.9 kg/m2 Obese (Class I) Increased incidence by 68%  35-39.9 kg/m2 Severe obesity (Class II) Increased incidence by 136%  >40 kg/m2 Extreme obesity (Class III) Increased incidence by 254%   Patient's current BMI Ideal Body weight  Body mass index is 25.77 kg/m. Ideal body weight: 77.6 kg (171 lb 1.2 oz) Adjusted ideal body weight: 81 kg (178 lb 10.3 oz)   BMI Readings from Last 4 Encounters:  10/07/19 25.77 kg/m  09/15/19 26.04 kg/m  09/12/19 25.77 kg/m  09/08/19 25.09 kg/m   Wt  Readings from Last 4 Encounters:  10/07/19 190 lb (86.2 kg)  09/15/19 192 lb (87.1 kg)  09/12/19 190 lb (86.2 kg)  09/08/19 185 lb (83.9 kg)  Psych/Mental status: Alert, oriented x 3 (person, place, & time)       Eyes: PERLA Respiratory: No evidence of acute respiratory distress  Cervical Spine Area Exam  Skin & Axial Inspection: Well healed scar from previous spine surgery detected Alignment: Symmetrical Functional ROM: Decreased ROM      Stability: No instability detected Muscle Tone/Strength: Functionally intact. No obvious neuro-muscular anomalies detected. Sensory (Neurological): Unimpaired  Lumbar Spine Area Exam  Skin & Axial Inspection: No masses, redness, or swelling Alignment: Symmetrical Functional ROM: Decreased ROM       Stability: No instability detected Muscle Tone/Strength: Functionally intact. No obvious neuro-muscular anomalies detected. Sensory (Neurological): Dermatomal pain pattern   Gait & Posture Assessment  Ambulation: Unassisted Gait: Relatively normal for age and body habitus Posture: WNL  Lower Extremity Exam    Side: Right lower extremity  Side: Left lower extremity  Stability: No instability observed          Stability: No instability observed          Skin & Extremity Inspection: Skin color, temperature, and hair growth are WNL. No peripheral edema or cyanosis. No masses, redness, swelling, asymmetry, or associated skin lesions. No contractures.  Skin & Extremity Inspection: Skin color, temperature, and hair growth are WNL. No peripheral edema or cyanosis. No masses, redness, swelling, asymmetry, or associated skin lesions. No contractures.  Functional ROM: Unrestricted ROM                  Functional ROM: Unrestricted ROM                  Muscle Tone/Strength: Functionally intact. No obvious neuro-muscular anomalies detected.  Muscle Tone/Strength: Functionally intact. No obvious neuro-muscular anomalies detected.  Sensory (Neurological):  Unimpaired        Sensory (Neurological): Unimpaired        DTR: Patellar: deferred today Achilles: deferred today Plantar: deferred today  DTR: Patellar: deferred today Achilles: deferred today Plantar: deferred today  Palpation: No palpable anomalies  Palpation: No palpable anomalies   Assessment   Status Diagnosis  Persistent Persistent Persistent 1. Chronic bilateral low back pain with bilateral sciatica   2. Lumbar radicular pain   3. Chronic pain syndrome      Updated Problems: Problem  Chronic Bilateral Low Back Pain With Bilateral Sciatica  Lumbar Radicular Pain   Patient was previously seen by me on 07/15/2019.  This was the patient's first encounter at our clinic.  Please see note from 07/15/2019.  At that time he presented with a chief complaint of neck with right arm pain.  At that time an order was placed for a right cervical epidural steroid injection which the patient never followed through with.  He states today that his neck pain is considerably better and he is not having any pain in that region anymore.  Today his primary pain complaint is his sciatica related pain.  He is saying that he is having increased pain in his low back with radiation down his legs, left greater than right.  He states that he had an MRI performed yesterday.  I informed him that I will need results from his MRI at which point I can present to him treatment plan which may include a diagnostic epidural steroid injection.  Patient became somewhat frustrated and was adamant about who would prescribe him his opioid medications.  Please refer to my initial clinic note on July 15, 2019 where I expressed my concerns regarding his previous criminal history and him not being a candidate for chronic opioid therapy.  When I informed the patient that we will need his lumbar MRI results before discussing injection, he left angtry and also cursed on his way out which was very inappropriate and rude.  No- follow  up.  Recent Visits Date Type Provider Dept  07/15/19 Office Visit Johnny Santa, MD [facility]  Showing recent visits within past 90 days and meeting all other requirements   Today's Visits Date Type Provider Dept  10/07/19 Office Visit Johnny Santa, MD [facility]  Showing today's visits and meeting all other requirements   Future Appointments No visits were found meeting these conditions.  Showing future appointments within next 90 days and meeting all other requirements   Primary Care Physician: Wynetta Emery,  Barb Merino, DO Location: [facility] Note by: Johnny Santa, MD Date: 10/07/2019; Time: 12:23 PM  Note: This dictation was prepared with Dragon dictation. Any transcriptional errors that may result from this process are unintentional.

## 2019-10-07 NOTE — Telephone Encounter (Signed)
Needs appointment and I need notes from ortho to consider this. I need notes before his appointment.

## 2019-10-07 NOTE — Telephone Encounter (Signed)
Copied from [place] [id number]. Topic: General - Other >> Oct 07, 2019 10:14 AM Celene Kras A wrote: Reason for CRM: Pt called stating he is needing a referral for a pain clinic. Pt states he does not want to go to the hospital because they will not prescribe pain medication. Pt states he also needs a provider to do injections since he was discharged from the last one. Please advise.

## 2019-10-07 NOTE — Telephone Encounter (Signed)
Referral is in review at [facility].Pt advised per their office it will probably be a few weeks before he hears from them concerning appointment.He states he was prescribed oxyCODONE (OXY IR/ROXICODONE) 5 MG by ortho today but states he will be out by 10/14/19 and was told that the PA can not send in another prescription.I told pt he really needs to call ortho back but he wanted message sent to you to ask if you would prescribe until he gets into pain clinic.He uses     [id number] - [place], [address] [phone] (Phone) [phone] (Fax)

## 2019-10-08 ENCOUNTER — Ambulatory Visit: Payer: Medicaid Other | Admitting: Student in an Organized Health Care Education/Training Program

## 2019-10-08 NOTE — Telephone Encounter (Signed)
Patient has appointment scheduled with [place] tomorrow at 9:30.   Called patient to find out where he was seen at orthopedics. Patient states he went to [facility]. While I had the patient on the phone, I reminded him of his appointment with Dr. Wynetta Emery in the morning.  Called [facility]. They state that the last time the patient had an OV with them was on 09/15/19 and had an MRI 10/06/19. They are faxing both of these results over to [place] as it is the most recent notes for the patient.

## 2019-10-09 ENCOUNTER — Ambulatory Visit (INDEPENDENT_AMBULATORY_CARE_PROVIDER_SITE_OTHER): Payer: Medicaid Other | Admitting: Family Medicine

## 2019-10-09 ENCOUNTER — Other Ambulatory Visit: Payer: Self-pay

## 2019-10-09 ENCOUNTER — Encounter: Payer: Self-pay | Admitting: Family Medicine

## 2019-10-09 VITALS — BP 148/100 | HR 69 | Temp 98.5°F | Ht 72.0 in | Wt 188.0 lb

## 2019-10-09 DIAGNOSIS — K429 Umbilical hernia without obstruction or gangrene: Secondary | ICD-10-CM

## 2019-10-09 DIAGNOSIS — M4726 Other spondylosis with radiculopathy, lumbar region: Secondary | ICD-10-CM | POA: Diagnosis not present

## 2019-10-09 MED ORDER — OXYCODONE HCL 5 MG PO TABS: 5.0000 mg | ORAL_TABLET | Freq: Three times a day (TID) | ORAL | 0 refills | Status: DC | PRN

## 2019-10-09 NOTE — Assessment & Plan Note (Signed)
MRI earlier this week. To follow up with orthopedics early next week. Will get him urgent referral to neurosurgery for evaluation. Waiting on referral to [facility] pain clinic. Will continue pain medicine right now- now taking oxycodone 5mg  TID. Rx given today. Recheck within 1 month.

## 2019-10-09 NOTE — Progress Notes (Signed)
BP (!) 148/100   Pulse 69   Temp 98.5 F (36.9 C) (Oral)   Ht 6' (1.829 m)   Wt 188 lb (85.3 kg)   SpO2 100%   BMI 25.50 kg/m    Subjective:    Patient ID: Johnny Jewett., male    DOB: 1971/02/07, 48 y.o.   MRN: [id number]  HPI: Johnny Henslee. is a 48 y.o. male  Chief Complaint  Patient presents with  . Pain  . Medication Refill   Has been seeing orthopedics. They have been giving him 5mg  oxycodone every 8 hours- has received 6 Rxs from them since his last visit here. 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 Went to pain management, and walked out when they told him they were not going to prescribe him opiate medications and needing his MRI results before discussing an injection. He still has not heard from [facility]. He has not followed up with physical therapy  CHRONIC PAIN  Present dose: 22.5 Morphine equivalents Pain control status: better Duration: chronic Location: low back and into both legs- mainly in the R leg Quality: sharp, aching, throbbing pain Current Pain Level: severe Previous Pain Level: severe Breakthrough pain: yes Benefit from narcotic medications: yes What Activities task can be accomplished with current medication? Able to do his ADLs Interested in weaning off narcotics:no   Stool softners/OTC fiber: no  Previous pain specialty evaluation: yes Non-narcotic analgesic meds: yes Narcotic contract: no  HERNIA Duration: months Location:  belly button Painful: no Discomfort: yes Bulge: yes Quality:  aching Onset: gradual Severity: mild Context: bigger Aggravating factors: lifting  Relevant past medical, surgical, family and social history reviewed and updated as indicated. Interim medical history since our last visit reviewed. Allergies and medications  reviewed and updated.  Review of Systems  Constitutional: Negative.   Respiratory: Negative.   Cardiovascular: Negative.   Musculoskeletal: Positive for back pain and myalgias. Negative for arthralgias, gait problem, joint swelling, neck pain and neck stiffness.  Skin: Negative.   Psychiatric/Behavioral: Negative.     Per HPI unless specifically indicated above     Objective:    BP (!) 148/100   Pulse 69   Temp 98.5 F (36.9 C) (Oral)   Ht 6' (1.829 m)   Wt 188 lb (85.3 kg)   SpO2 100%   BMI 25.50 kg/m   Wt Readings from Last 3 Encounters:  10/09/19 188 lb (85.3 kg)  10/07/19 190 lb (86.2 kg)  09/15/19 192 lb (87.1 kg)    Physical Exam Vitals signs and nursing note reviewed.  Constitutional:      General: He is not in acute distress.    Appearance: Normal appearance. He is not ill-appearing, toxic-appearing or diaphoretic.  HENT:     Head: Normocephalic and atraumatic.     Right Ear: External ear normal.     Left Ear: External ear normal.     Nose: Nose normal.     Mouth/Throat:     Mouth: Mucous membranes are moist.     Pharynx: Oropharynx is clear.  Eyes:     General: No scleral icterus.       Right eye: No discharge.        Left eye: No discharge.     Extraocular Movements: Extraocular movements intact.  Conjunctiva/sclera: Conjunctivae normal.     Pupils: Pupils are equal, round, and reactive to light.  Neck:     Musculoskeletal: Normal range of motion and neck supple.  Cardiovascular:     Rate and Rhythm: Normal rate and regular rhythm.     Pulses: Normal pulses.     Heart sounds: Normal heart sounds. No murmur. No friction rub. No gallop.   Pulmonary:     Effort: Pulmonary effort is normal. No respiratory distress.     Breath sounds: Normal breath sounds. No stridor. No wheezing, rhonchi or rales.  Chest:     Chest wall: No tenderness.  Abdominal:     General: Abdomen is flat. Bowel sounds are normal. There is no distension.     Palpations:  There is no mass.     Tenderness: There is no abdominal tenderness. There is no right CVA tenderness, left CVA tenderness, guarding or rebound.     Hernia: A hernia (umbilical) is present.  Musculoskeletal: Normal range of motion.  Skin:    General: Skin is warm and dry.     Capillary Refill: Capillary refill takes less than 2 seconds.     Coloration: Skin is not jaundiced or pale.     Findings: No bruising, erythema, lesion or rash.  Neurological:     General: No focal deficit present.     Mental Status: He is alert and oriented to person, place, and time. Mental status is at baseline.  Psychiatric:        Mood and Affect: Mood normal.        Behavior: Behavior normal.        Thought Content: Thought content normal.        Judgment: Judgment normal.     Results for orders placed or performed during the hospital encounter of [id number]  Basic metabolic panel  Result Value Ref Range   Sodium 138 135 - 145 mmol/L   Potassium 3.3 (L) 3.5 - 5.1 mmol/L   Chloride 104 98 - 111 mmol/L   CO2 24 22 - 32 mmol/L   Glucose, Bld 114 (H) 70 - 99 mg/dL   BUN 15 6 - 20 mg/dL   Creatinine, Ser 1.06 0.61 - 1.24 mg/dL   Calcium 8.9 8.9 - 10.3 mg/dL   GFR calc non Af Amer >60 >60 mL/min   GFR calc Af Amer >60 >60 mL/min   Anion gap 10 5 - 15  CBC  Result Value Ref Range   WBC 7.6 4.0 - 10.5 K/uL   RBC 5.22 4.22 - 5.81 MIL/uL   Hemoglobin 16.6 13.0 - 17.0 g/dL   HCT 47.9 39.0 - 52.0 %   MCV 91.8 80.0 - 100.0 fL   MCH 31.8 26.0 - 34.0 pg   MCHC 34.7 30.0 - 36.0 g/dL   RDW 12.7 11.5 - 15.5 %   Platelets 222 150 - 400 K/uL   nRBC 0.0 0.0 - 0.2 %  Troponin I (High Sensitivity)  Result Value Ref Range   Troponin I (High Sensitivity) 4 <18 ng/L      Assessment & Plan:   Problem List Items Addressed This Visit      Nervous and Auditory   Osteoarthritis of spine with radiculopathy, lumbar region - Primary    MRI earlier this week. To follow up with orthopedics early next week. Will get him  urgent referral to neurosurgery for evaluation. Waiting on referral to [facility] pain clinic. Will continue pain medicine right now- now taking oxycodone  5mg  TID. Rx given today. Recheck within 1 month.       Relevant Medications   oxyCODONE (OXY IR/ROXICODONE) 5 MG immediate release tablet   Other Relevant Orders   Ambulatory referral to Neurosurgery    Other Visit Diagnoses    Umbilical hernia without obstruction and without gangrene       Referral to general surgery made today. Call with any concerns.    Relevant Orders   Ambulatory referral to General Surgery       Follow up plan: Return in about 4 weeks (around 11/06/2019).

## 2019-10-15 ENCOUNTER — Ambulatory Visit: Payer: Medicaid Other | Attending: Family Medicine

## 2019-10-15 DIAGNOSIS — M5416 Radiculopathy, lumbar region: Secondary | ICD-10-CM | POA: Insufficient documentation

## 2019-10-15 DIAGNOSIS — R293 Abnormal posture: Secondary | ICD-10-CM | POA: Insufficient documentation

## 2019-10-21 ENCOUNTER — Telehealth: Payer: Self-pay | Admitting: Family Medicine

## 2019-10-21 ENCOUNTER — Other Ambulatory Visit: Payer: Self-pay

## 2019-10-21 ENCOUNTER — Ambulatory Visit: Payer: Medicaid Other

## 2019-10-21 DIAGNOSIS — M5416 Radiculopathy, lumbar region: Secondary | ICD-10-CM

## 2019-10-21 DIAGNOSIS — R293 Abnormal posture: Secondary | ICD-10-CM | POA: Diagnosis present

## 2019-10-21 NOTE — Telephone Encounter (Signed)
If they have a pain clinic that could see him, that would be amazing. It was placed as urgent as patient is in significant pain- no significant changes. Thanks.

## 2019-10-21 NOTE — Telephone Encounter (Signed)
Copied from [place] [id number]. Topic: Referral - Status >> Oct 21, 2019  2:41 PM Scherrie Gerlach wrote: Reason for CRM: Rollene Fare w/ [facility] called to advise the pt had appt with them 10/22.  But pt himself called and rescheduled for next available which is 11/10/19. They received urgent referral, but pt is the one that rescheduled.  They do not have any more recent scans than 04/2019, and want to know what has changed that makes this urgent? And the pt resceduled. Also, she wants you to know they have a pain clinic right in their office. Pt would not need to go to [facility] pain clinic.

## 2019-10-21 NOTE — Therapy (Signed)
[facility] [address] [place], [address] Phone: [phone]   Fax:  [phone]  Physical Therapy Evaluation  Patient Details  Name: Johnny [name]. MRN: [id number] Date of Birth: [birth year] Referring Provider (PT): Park Liter DO   Encounter Date: 10/21/2019  PT End of Session - 10/21/19 1524    Visit Number  1    Number of Visits  13    Date for PT Re-Evaluation  08/26/19    PT Start Time  0900    PT Stop Time  0950    PT Time Calculation (min)  50 min    Activity Tolerance  Patient tolerated treatment well;Patient limited by pain    Behavior During Therapy  [facility] for tasks assessed/performed       Past Medical History:  Diagnosis Date  . Arthritis    hands, knees, lower back  . Bilateral sciatica   . Carpal tunnel syndrome   . Chronic back pain   . Chronic neck pain   . Chronic pain syndrome   . GERD (gastroesophageal reflux disease)   . Hand joint pain 07/02/2013  . Herniated disc   . Traumatic amputation of left thumb 2013  . Wears dentures    full upper and lower    Past Surgical History:  Procedure Laterality Date  . AMPUTATION FINGER / THUMB Left 2013  . COLONOSCOPY WITH PROPOFOL N/A 01/03/2018   Procedure: COLONOSCOPY WITH PROPOFOL;  Surgeon: [name], MD;  Location: [facility];  Service: Endoscopy;  Laterality: N/A;  . ESOPHAGOGASTRODUODENOSCOPY (EGD) WITH PROPOFOL N/A 01/03/2018   Procedure: ESOPHAGOGASTRODUODENOSCOPY (EGD) WITH PROPOFOL;  Surgeon: [name], MD;  Location: [facility];  Service: Endoscopy;  Laterality: N/A;  . HERNIA REPAIR    . HIP SURGERY    . JOINT REPLACEMENT Right    knee  . KNEE SURGERY    . neck fusion    . POLYPECTOMY N/A 01/03/2018   Procedure: POLYPECTOMY;  Surgeon: [name], MD;  Location: [facility];  Service: Endoscopy;  Laterality: N/A;  . REPLACEMENT TOTAL KNEE Right   . SHOULDER SURGERY Right 2016 X  2  . SPINAL FUSION    . TONSILLECTOMY      There were no vitals filed for this visit.   Subjective Assessment - 10/21/19 1006    Subjective  Patient is a pleasant 48 yo male reporting to physical therapy for LBP with R-sided sciatica. Known to therapist for previous treatment of R-sided cervical radiculopathy s/p ACDF d/c following inconsistent attendance. 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 Currently unemployed and says he "needs to find a new house". Reports difficulty dressing and driving due to LBP. He sleeps without issue.    Pertinent History  Reports LBP sudden onset in August 2020 without precipitating event. Patient reports that he feels a stretching sensation and pain radiating down the RLE causing "cramping" in calf and "numbness" in foot. Constant unless lying down. Medication helps but does not abolish. Aggravating: sit <> stand, stairs ascending>descending, cough/sneeze. Ease: lying supine. PMH includes cervical radiculopathy/ACDF (received PT with good results); umbilical hernia    Limitations  Sitting;Lifting;House hold activities;Walking    How long can you sit comfortably?  2 minutes    How long can you stand comfortably?  "A while"    Diagnostic tests  MRI positive for DDD/foraminal stenosis B L4-5; ACDF C5-6 with  DDD c-spine    Patient Stated Goals  To feel better, to dress and drive and go up/down stairs without pain    Currently in Pain?  Yes    Pain Score  4     Pain Location  Back    Pain Orientation  Right;Lower    Pain Descriptors / Indicators  Aching;Cramping;Shooting;Numbness    Pain Type  Chronic pain    Pain Radiating Towards  radiates down RLE to calf and foot    Pain Onset  More than a month ago    Pain Frequency  Constant    Aggravating Factors   Activity    Pain Relieving Factors  lying down    Effect of Pain on Daily Activities  Pain with dressing, driving; general decrease in activity     Multiple Pain Sites  No         [facility] PT Assessment - 10/21/19 0001      Assessment   Medical Diagnosis  Lumbar radiculopathy    Referring Provider (PT)  Park Liter DO    Onset Date/Surgical Date  08/07/19    Hand Dominance  Right    Next MD Visit  11/16    Prior Therapy  Yes      Precautions   Precautions  None      Restrictions   Weight Bearing Restrictions  No      Balance Screen   Has the patient fallen in the past 6 months  No    Has the patient had a decrease in activity level because of a fear of falling?   Yes    Is the patient reluctant to leave their home because of a fear of falling?   Yes      [place] residence    Home Access  Stairs to enter    Entrance Stairs-Number of Steps  7    Entrance Stairs-Rails  Can reach both      Prior Function   Level of Independence  Independent    Vocation  Unemployed    Leisure  Working on Equities trader   Overall Cognitive Status  Within Functional Limits for tasks assessed         OBJECTIVE  PAIN  N 4 W 10+ B 4  Agg: upright walking/standing, cough, sneeze, sit <> stand Ease: supine, medication  No effect on sleep  PRECAUTIONS: hernia  POSTURE  Shifted to L in sitting. Mild-moderate rounded shoulders/forward head. Mildly increased kyphosis.  GAIT  Stairs: incr pain, worse ascending.   ROM  Lumbar: Flex 25%! Ext WNL SBL 50%! SBR 50% RotL 75% RotR 75%  Thoracic Flex 50%! Ext 100%! RotL WNL RotR WNL  Hip ROM WNL but p! R  Knee ROM WNL  Ankle ROM WNL  MMT  Hip R L Flex 3+! 4+ Ext 4! 5! Abd 3+! 4 Add 4! 5  Knee R L Flex 4 5 Ext 4-! 4  Ankle DF 5 5 PF WNL WNL  PAMs  WNL through lumbar spine and thoracic spine  SPECIAL TESTS  SLR + R, - L FABER unable to assess due to pain Slump + R  PALPATION Increased tissue tension through inferior traps/lats TTP 2+ over L1 L2  TREATMENT Standing lumbar  ext PPU          PT Education - 10/21/19 1520    Education Details  Diagnosis, prognosis, POC; emphasized need for consistent attendance;  HEP: POE, sciatic nerve glide, passive lumbar extension in standing    Person(s) Educated  Patient    Methods  Explanation;Demonstration;Verbal cues    Comprehension  Returned demonstration;Verbal cues required;Verbalized understanding       PT Short Term Goals - 10/21/19 1652      PT SHORT TERM GOAL #1   Title  Patient will demonstrate compliance with HEP 3x/wk as adjunct to clinical therapy and to reduce total number of visits    Baseline  HEP given    Time  2    Period  Weeks    Status  New        PT Long Term Goals - 10/21/19 1653      PT LONG TERM GOAL #1   Title  Patient will report constant pain 2/10 to demonstrate reduced disability with activities of daily living.    Baseline  Constant pain 4/10    Time  8    Period  Weeks    Status  New    Target Date  12/16/19      PT LONG TERM GOAL #2   Title  Pt will demonstrate ability to dress and drive without increase in pain for reduced disability with ADLs.    Baseline  Donning socks/shoes, using parking break increases pain severely    Time  8    Period  Weeks    Status  New    Target Date  12/16/19      PT LONG TERM GOAL #3   Title  Patient will improve RLE strength to 4+/5 grossly without increased pain for return to work activities.    Baseline  RLE strength grossly 3+/5 influenced by pain    Time  8    Period  Weeks    Status  New    Target Date  12/16/19      PT LONG TERM GOAL #4   Title  Patient will demonstrate ability to ascend/descend stairs without increase in pain for reduced disability with community ambulation.    Baseline  Ascending stairs increases pain to 10/10    Time  8    Period  Weeks    Status  New    Target Date  12/16/19             Plan - 10/21/19 1525    Clinical Impression Statement  Patient is a pleasant 48 yo male presenting to  PT with complaints of R-sided LBP with radicular symptoms extending past the knee. Negative for red flags. Patient responds with moderate-high irritability and high pain limiting aggressive examination. Postural examination shows increased thoracic kyphosis and moderate forward head/rounded shoulders. Limitations with all lumbar movements excepting extension. Pain with resisted R hip movements. + SLR sign and slump test. Clinical impression: symptoms consistent with LBP with moderate sciatica possibly influenced by DDD/lateral foramenal stenosis resulting in pain, limited ROM, and limitations in ADLs. Patient will benefit from skilled physical therapy to reduce pain, restore mobility and activity tolerance, and promote return to PLOF.    Personal Factors and Comorbidities  Behavior Pattern;Comorbidity 1;Comorbidity 2;Transportation;Profession    Comorbidities  Umbilical hernia; DDD    Examination-Activity Limitations  Locomotion Level;Sit;Squat;Stairs;Lift;Dressing    Examination-Participation Restrictions  Driving    Stability/Clinical Decision Making  Unstable/Unpredictable    Clinical Decision Making  High    Rehab Potential  Good    PT Frequency  2x / week    PT Duration  8 weeks    PT Treatment/Interventions  ADLs/Self Care Home Management;Cryotherapy;Electrical Stimulation;Moist Heat;Patient/family education;Spinal Manipulations;Dry needling;Passive range of motion;Taping;Energy conservation;Neuromuscular re-education;Therapeutic activities;Therapeutic exercise;Balance training;Stair training;Functional mobility training;Manual techniques    PT Next Visit Plan  Progress neurodynamics/extension therex    PT Home Exercise Plan  Sciatic nerve glide; POE; passive hip/lumbar ext    Consulted and Agree with Plan of Care  Patient       Patient will benefit from skilled therapeutic intervention in order to improve the following deficits and impairments:  Abnormal gait, Decreased range of motion,  Difficulty walking, Impaired tone, Decreased endurance, Decreased activity tolerance, Pain, Impaired perceived functional ability, Impaired flexibility, Hypomobility, Improper body mechanics, Decreased mobility, Decreased strength, Postural dysfunction  Visit Diagnosis: Radiculopathy, lumbar region  Abnormal posture     Problem List Patient Active Problem List   Diagnosis Date Noted  . Osteoarthritis of spine with radiculopathy, lumbar region 10/09/2019  . Chronic bilateral low back pain with bilateral sciatica 10/07/2019  . S/P cervical spinal fusion (C5-C6 ACDF) 07/15/2019  . Cervical radicular pain 07/15/2019  . Cervical facet joint syndrome 07/15/2019  . Degeneration of lumbar intervertebral disc 07/03/2019  . Lumbar radicular pain 07/03/2019  . Cervical spondylosis 04/17/2019  . Homeless single person 04/17/2019  . History of total knee replacement, right 04/25/2018  . Pancreatic mass 01/14/2018  . Tobacco abuse 01/14/2018  . Chronic neck pain   . Benign neoplasm of descending colon   . Benign neoplasm of ascending colon   . Intractable vomiting with nausea   . Reflux esophagitis   . Duodenal ulcer without hemorrhage or perforation   . Chronic pain syndrome 08/07/2017  . Vitamin D deficiency 06/20/2017  . Insomnia 06/19/2017  . Right sided sciatica   . Presence of artificial knee joint 01/17/2013  . Knee pain 01/17/2013  . 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 Traumatic amputation of finger 09/20/2012    Virgia Land, SPT 10/21/2019, 5:02 PM  [facility] [address] [place], [address] Phone: [phone]   Fax:  [phone]  Name: Eshwar Christo. MRN: [id number] Date of Birth: Dec 27, 1970

## 2019-10-21 NOTE — Telephone Encounter (Signed)
Returned call to [facility] and left message for pain clinic new patient referral coordinator that he does need to be seen by them.

## 2019-10-22 ENCOUNTER — Encounter: Payer: Self-pay | Admitting: Family Medicine

## 2019-10-22 ENCOUNTER — Telehealth: Payer: Self-pay | Admitting: Family Medicine

## 2019-10-22 ENCOUNTER — Ambulatory Visit: Payer: Self-pay | Admitting: Licensed Clinical Social Worker

## 2019-10-22 DIAGNOSIS — G4700 Insomnia, unspecified: Secondary | ICD-10-CM

## 2019-10-22 NOTE — Telephone Encounter (Signed)
° ° °  Called pt regarding [employer] Referral for transportation, stated that he does have his own car and is able to make his appointments was interested in resources as a back up in case he needed them asked for me to mail letter to him with resources.  [place] [facility]  ??[email or web address]   ??[phone]

## 2019-10-22 NOTE — Chronic Care Management (AMB) (Signed)
  Care Management   Follow Up Note   10/22/2019 Name: Johnny [name]. MRN: [id number] DOB: 08-Nov-1971  Referred by: Valerie Roys, DO Reason for referral : Care Coordination   Johnny [name]. is a 48 y.o. year old male who is a primary care patient of Valerie Roys, DO. The care management team was consulted for assistance with care management and care coordination needs.    Review of patient status, including review of consultants reports, relevant laboratory and other test results, and collaboration with appropriate care team members and the patient's provider was performed as part of comprehensive patient evaluation and provision of chronic care management services.    SDOH (Social Determinants of Health) screening performed today: Housing . See Care Plan for related entries.   Advanced Directives: See Care Plan and [employer] application for related entries.   Goals Addressed    . "I need safe and stable housing" (pt-stated)       Current Barriers:  . Financial constraints . Limited social support . Limited access to food . Housing barriers . Limited education about housing resources available during COVID-19*  Clinical Social Work Clinical Goal(s):  Marland Kitchen Over the next 90 days, client will work with SW to address concerns related to lack of stable housing. . Over the next 90 days, client will follow up with crisis support resources * as directed by SW  Interventions: . Patient interviewed and appropriate assessments performed . Collaboration with housing agencies/resources. LCSW received return call from several shelters ([place] and [place]) who denied being able to take patient in as they were at full capacity. Patient was updated on this information.  Marland Kitchen LCSW sent text message to patient with a list of financial support resources for him to refer back to whenever needed . Provided patient with information about housing resources available during [place]-   . Patient reports that he wishes to relocate out of the boarding house he's in. He reports that this boarding house is actually very nice and updated but reports that there are two other male roommates living there with him and one causing constant conflict. He shares that one roommate often has arguments with his girlfriend which causes a disturbance throughout the nights and early mornings.  . Advised patient to contact CCM team directly if any urgent needs present . Assisted patient/caregiver with obtaining information about health plan benefits . Patient reports ongoing chronic pain- to the point where he cannot lay down, sit down or stand up without experience excruciating pain. Patient wishes to discuss symptom management with his PCP. Patient is open to considering Home Health options.  . Patient was encouraged to go to [employer] today to inquire where he is at on the wait list for [employer].   Patient Self Care Activities:  . Attends all scheduled provider appointments . Calls provider office for new concerns or questions  Please see past updates related to this goal by clicking on the "Past Updates" button in the selected goal      The care management team will reach out to the patient again over the next 90 days.   [name], BSW, [name], [facility] Care Management [email or web address] Phone: [phone]

## 2019-10-23 ENCOUNTER — Encounter: Payer: Self-pay | Admitting: General Surgery

## 2019-10-23 ENCOUNTER — Other Ambulatory Visit: Payer: Self-pay

## 2019-10-23 ENCOUNTER — Ambulatory Visit (INDEPENDENT_AMBULATORY_CARE_PROVIDER_SITE_OTHER): Payer: Medicaid Other | Admitting: General Surgery

## 2019-10-23 VITALS — BP 122/74 | HR 68 | Temp 97.9°F | Ht 72.0 in | Wt 194.0 lb

## 2019-10-23 DIAGNOSIS — K429 Umbilical hernia without obstruction or gangrene: Secondary | ICD-10-CM | POA: Diagnosis not present

## 2019-10-23 NOTE — Patient Instructions (Addendum)
You have requested for your Umbilical Hernia be repaired. This has been scheduled for 11/07/19 with Dr. [name] at [facility].  Please see your (blue)pre-care sheet for information.  You will need to arrange to be off work for 1-2 weeks but will have to have a lifting restriction of no more than 15 lbs for 6 weeks following your surgery.  Try to reduce your smoking prior to surgery.    Umbilical Hernia, Adult A hernia is a bulge of tissue that pushes through an opening between muscles. An umbilical hernia happens in the abdomen, near the belly button (umbilicus). The hernia may contain tissues from the small intestine, large intestine, or fatty tissue covering the intestines (omentum). Umbilical hernias in adults tend to get worse over time, and they require surgical treatment. There are several types of umbilical hernias. You may have:  A hernia located just above or below the umbilicus (indirect hernia). This is the most common type of umbilical hernia in adults.  A hernia that forms through an opening formed by the umbilicus (direct hernia).  A hernia that comes and goes (reducible hernia). A reducible hernia may be visible only when you strain, lift something heavy, or cough. This type of hernia can be pushed back into the abdomen (reduced).  A hernia that traps abdominal tissue inside the hernia (incarcerated hernia). This type of hernia cannot be reduced.  A hernia that cuts off blood flow to the tissues inside the hernia (strangulated hernia). The tissues can start to die if this happens. This type of hernia requires emergency treatment.  What are the causes? An umbilical hernia happens when tissue inside the abdomen presses on a weak area of the abdominal muscles. What increases the risk? You may have a greater risk of this condition if you:  Are obese.  Have had several pregnancies.  Have a buildup of fluid inside your abdomen (ascites).  Have had surgery that weakens  the abdominal muscles.  What are the signs or symptoms? The main symptom of this condition is a painless bulge at or near the belly button. A reducible hernia may be visible only when you strain, lift something heavy, or cough. Other symptoms may include:  Dull pain.  A feeling of pressure.  Symptoms of a strangulated hernia may include:  Pain that gets increasingly worse.  Nausea and vomiting.  Pain when pressing on the hernia.  Skin over the hernia becoming red or purple.  Constipation.  Blood in the stool.  How is this diagnosed? This condition may be diagnosed based on:  A physical exam. You may be asked to cough or strain while standing. These actions increase the pressure inside your abdomen and force the hernia through the opening in your muscles. Your health care provider may try to reduce the hernia by pressing on it.  Your symptoms and medical history.  How is this treated? Surgery is the only treatment for an umbilical hernia. Surgery for a strangulated hernia is done as soon as possible. If you have a small hernia that is not incarcerated, you may need to lose weight before having surgery. Follow these instructions at home:  Lose weight, if told by your health care provider.  Do not try to push the hernia back in.  Watch your hernia for any changes in color or size. Tell your health care provider if any changes occur.  You may need to avoid activities that increase pressure on your hernia.  Do not lift anything that is  heavier than 10 lb (4.5 kg) until your health care provider says that this is safe.  Take over-the-counter and prescription medicines only as told by your health care provider.  Keep all follow-up visits as told by your health care provider. This is important. Contact a health care provider if:  Your hernia gets larger.  Your hernia becomes painful. Get help right away if:  You develop sudden, severe pain near the area of your hernia.   You have pain as well as nausea or vomiting.  You have pain and the skin over your hernia changes color.  You develop a fever. This information is not intended to replace advice given to you by your health care provider. Make sure you discuss any questions you have with your health care provider. Document Released: 05/12/2016 Document Revised: 08/13/2016 Document Reviewed: 05/12/2016 Elsevier Interactive Patient Education  [employer].

## 2019-10-23 NOTE — H&P (View-Only) (Signed)
Patient ID: Johnny Jewett., male   DOB: 07/08/1971, 48 y.o.   MRN: [id number]  Chief Complaint  Patient presents with  . New Patient (Initial Visit)    umbilical hernia    HPI Johnny [name]. is a 48 y.o. male.   He reports that he has had a bulging at his bellybutton for about a year.  He says that the main symptom that he notices is discomfort/pain.  He does endorse some gas and bloating, but ascribes this to his diet that is high in fatty foods.  He denies any nausea or vomiting.  He does endorse constipation.  The pain does not radiate.  He denies any episodes of obstipation.  There is never been a time where the hernia became hard, red, or exquisitely tender/irreducible.  Due to increasing discomfort, he would like to have it repaired.   Past Medical History:  Diagnosis Date  . Arthritis    hands, knees, lower back  . Bilateral sciatica   . Carpal tunnel syndrome   . Chronic back pain   . Chronic neck pain   . Chronic pain syndrome   . GERD (gastroesophageal reflux disease)   . Hand joint pain 07/02/2013  . Herniated disc   . Traumatic amputation of left thumb 2013  . Wears dentures    full upper and lower    Past Surgical History:  Procedure Laterality Date  . AMPUTATION FINGER / THUMB Left 2013  . COLONOSCOPY WITH PROPOFOL N/A 01/03/2018   Procedure: COLONOSCOPY WITH PROPOFOL;  Surgeon: [name], MD;  Location: [place];  Service: Endoscopy;  Laterality: N/A;  . ESOPHAGOGASTRODUODENOSCOPY (EGD) WITH PROPOFOL N/A 01/03/2018   Procedure: ESOPHAGOGASTRODUODENOSCOPY (EGD) WITH PROPOFOL;  Surgeon: [name], MD;  Location: [place];  Service: Endoscopy;  Laterality: N/A;  . HERNIA REPAIR Right    right inguinal hernia repaired twice  . HIP SURGERY    . JOINT REPLACEMENT Right    knee  . KNEE SURGERY Right   . neck fusion    . POLYPECTOMY N/A 01/03/2018   Procedure: POLYPECTOMY;  Surgeon: [name], MD;  Location: [place];  Service: Endoscopy;  Laterality: N/A;  . REPLACEMENT TOTAL KNEE Right   . SHOULDER SURGERY Right 2016 X 2  . SPINAL FUSION    . TONSILLECTOMY      Family History  Problem Relation Age of Onset  . Cancer Father        sarcoma  . Cirrhosis Paternal Grandmother   . Cancer Paternal Grandfather        Pancreatic  . Fibromyalgia Sister   . Deafness Sister     Social History Social History   Tobacco Use  . Smoking status: Current Every Day Smoker    Packs/day: 0.50    Years: 25.00    Pack years: 12.50    Types: Cigarettes  . Smokeless tobacco: Never Used  Substance Use Topics  . Alcohol use: No  . Drug use: No    Allergies  Allergen Reactions  . Crab [Shellfish Allergy] Itching and Swelling  . Flexeril [Cyclobenzaprine] Anaphylaxis  . Robaxin [Methocarbamol] Itching  . Codeine Itching  . Latex Swelling    Gloves(when worn), condoms  . Penicillins Nausea And Vomiting    Has patient had a PCN reaction causing immediate rash, facial/tongue/throat swelling, SOB or lightheadedness with hypotension: No Has patient had a PCN reaction causing severe rash involving mucus membranes or skin necrosis: No Has patient had  a PCN reaction that required hospitalization: No Has patient had a PCN reaction occurring within the last 10 years: No If all of the above answers are "NO", then may proceed with Cephalosporin use.   . Sulfamethoxazole-Trimethoprim Nausea Only and Other (See Comments)    Stomach pain   . Tramadol Other (See Comments)    Headache  Other reaction(s): Headaches    Current Outpatient Medications  Medication Sig Dispense Refill  . baclofen (LIORESAL) 10 MG tablet Take 10 mg by mouth 2 (two) times daily.    Marland Kitchen gabapentin (NEURONTIN) 600 MG tablet Take 2 tablets (1,200 mg total) by mouth 3 (three) times daily. (Patient taking differently: Take 1,400 mg by mouth 3 (three) times daily. ) 180 tablet 6  . ibuprofen (ADVIL) 200 MG tablet Take by mouth.    .  oxyCODONE (OXY IR/ROXICODONE) 5 MG immediate release tablet Take 1 tablet (5 mg total) by mouth 3 (three) times daily as needed for up to 28 days for breakthrough pain. 84 tablet 0   No current facility-administered medications for this visit.     Review of Systems Review of Systems  Musculoskeletal: Positive for arthralgias, back pain and neck pain.  All other systems reviewed and are negative.    Today's Vitals   10/23/19 1409  BP: 122/74  Pulse: 68  Temp: 97.9 F (36.6 C)  SpO2: 97%  Weight: 194 lb (88 kg)  Height: 6' (1.829 m)   Body mass index is 26.31 kg/m.  Physical Exam Physical Exam Constitutional:      General: He is not in acute distress.    Appearance: Normal appearance. He is normal weight.  HENT:     Head: Normocephalic and atraumatic.     Nose:     Comments: Covered with a mask secondary to COVID-19 precautions    Mouth/Throat:     Comments: Covered with a mask secondary to COVID-19 precautions Eyes:     General: No scleral icterus.       Right eye: No discharge.        Left eye: No discharge.  Neck:     Comments: No thyromegaly or dominant thyroid masses appreciated. Cardiovascular:     Rate and Rhythm: Normal rate and regular rhythm.     Pulses: Normal pulses.  Pulmonary:     Effort: Pulmonary effort is normal.     Breath sounds: Wheezing present.     Comments: End expiratory Abdominal:     General: Bowel sounds are normal.     Palpations: Abdomen is soft.     Hernia: A hernia is present.     Comments: There is reducible inguinal hernia present.  The fascial defect that I am able to palpate seems quite small.  Genitourinary:    Comments: Deferred Musculoskeletal:        General: Tenderness present. No swelling.     Comments: Scar over the right knee consistent with total knee arthroplasty.  Lymphadenopathy:     Cervical: No cervical adenopathy.  Skin:    General: Skin is warm and dry.     Comments: Multiple tattoos  Neurological:      General: No focal deficit present.     Mental Status: He is alert and oriented to person, place, and time.  Psychiatric:        Mood and Affect: Mood normal.        Behavior: Behavior normal.     Data Reviewed There are no relevant data available to review  Assessment This is a 48 year old man with an umbilical hernia.  It has become more symptomatic and he desires surgical repair.  Plan I recommended that he undergo open umbilical hernia repair.  This may or may not involve the use of mesh, depending on the size of the defect identified at surgery.  The risks of the operation were discussed with him.  These include, but are not limited to, bleeding, infection, injury to bowel or other structures within the hernia, mesh complications including infection, shifting, or pain, risk of recurrence, need for additional procedures.  He understands that his smoking makes his risk of recurrent somewhat higher.  He accepts the risks and wishes to proceed.  We will get him scheduled for an operation.    [name] 10/23/2019, 3:05 PM

## 2019-10-23 NOTE — Progress Notes (Signed)
Patient ID: Johnny Jewett., male   DOB: 1971-11-08, 48 y.o.   MRN: [id number]  Chief Complaint  Patient presents with  . New Patient (Initial Visit)    umbilical hernia    HPI Johnny [name]. is a 48 y.o. male.   He reports that he has had a bulging at his bellybutton for about a year.  He says that the main symptom that he notices is discomfort/pain.  He does endorse some gas and bloating, but ascribes this to his diet that is high in fatty foods.  He denies any nausea or vomiting.  He does endorse constipation.  The pain does not radiate.  He denies any episodes of obstipation.  There is never been a time where the hernia became hard, red, or exquisitely tender/irreducible.  Due to increasing discomfort, he would like to have it repaired.   Past Medical History:  Diagnosis Date  . Arthritis    hands, knees, lower back  . Bilateral sciatica   . Carpal tunnel syndrome   . Chronic back pain   . Chronic neck pain   . Chronic pain syndrome   . GERD (gastroesophageal reflux disease)   . Hand joint pain 07/02/2013  . Herniated disc   . Traumatic amputation of left thumb 2013  . Wears dentures    full upper and lower    Past Surgical History:  Procedure Laterality Date  . AMPUTATION FINGER / THUMB Left 2013  . COLONOSCOPY WITH PROPOFOL N/A 01/03/2018   Procedure: COLONOSCOPY WITH PROPOFOL;  Surgeon: [name], MD;  Location: [place];  Service: Endoscopy;  Laterality: N/A;  . ESOPHAGOGASTRODUODENOSCOPY (EGD) WITH PROPOFOL N/A 01/03/2018   Procedure: ESOPHAGOGASTRODUODENOSCOPY (EGD) WITH PROPOFOL;  Surgeon: [name], MD;  Location: [place];  Service: Endoscopy;  Laterality: N/A;  . HERNIA REPAIR Right    right inguinal hernia repaired twice  . HIP SURGERY    . JOINT REPLACEMENT Right    knee  . KNEE SURGERY Right   . neck fusion    . POLYPECTOMY N/A 01/03/2018   Procedure: POLYPECTOMY;  Surgeon: [name], MD;  Location: [place];  Service: Endoscopy;  Laterality: N/A;  . REPLACEMENT TOTAL KNEE Right   . SHOULDER SURGERY Right 2016 X 2  . SPINAL FUSION    . TONSILLECTOMY      Family History  Problem Relation Age of Onset  . Cancer Father        sarcoma  . Cirrhosis Paternal Grandmother   . Cancer Paternal Grandfather        Pancreatic  . Fibromyalgia Sister   . Deafness Sister     Social History Social History   Tobacco Use  . Smoking status: Current Every Day Smoker    Packs/day: 0.50    Years: 25.00    Pack years: 12.50    Types: Cigarettes  . Smokeless tobacco: Never Used  Substance Use Topics  . Alcohol use: No  . Drug use: No    Allergies  Allergen Reactions  . Crab [Shellfish Allergy] Itching and Swelling  . Flexeril [Cyclobenzaprine] Anaphylaxis  . Robaxin [Methocarbamol] Itching  . Codeine Itching  . Latex Swelling    Gloves(when worn), condoms  . Penicillins Nausea And Vomiting    Has patient had a PCN reaction causing immediate rash, facial/tongue/throat swelling, SOB or lightheadedness with hypotension: No Has patient had a PCN reaction causing severe rash involving mucus membranes or skin necrosis: No Has patient had  a PCN reaction that required hospitalization: No Has patient had a PCN reaction occurring within the last 10 years: No If all of the above answers are "NO", then may proceed with Cephalosporin use.   . Sulfamethoxazole-Trimethoprim Nausea Only and Other (See Comments)    Stomach pain   . Tramadol Other (See Comments)    Headache  Other reaction(s): Headaches    Current Outpatient Medications  Medication Sig Dispense Refill  . baclofen (LIORESAL) 10 MG tablet Take 10 mg by mouth 2 (two) times daily.    Marland Kitchen gabapentin (NEURONTIN) 600 MG tablet Take 2 tablets (1,200 mg total) by mouth 3 (three) times daily. (Patient taking differently: Take 1,400 mg by mouth 3 (three) times daily. ) 180 tablet 6  . ibuprofen (ADVIL) 200 MG tablet Take by mouth.    .  oxyCODONE (OXY IR/ROXICODONE) 5 MG immediate release tablet Take 1 tablet (5 mg total) by mouth 3 (three) times daily as needed for up to 28 days for breakthrough pain. 84 tablet 0   No current facility-administered medications for this visit.     Review of Systems Review of Systems  Musculoskeletal: Positive for arthralgias, back pain and neck pain.  All other systems reviewed and are negative.    Today's Vitals   10/23/19 1409  BP: 122/74  Pulse: 68  Temp: 97.9 F (36.6 C)  SpO2: 97%  Weight: 194 lb (88 kg)  Height: 6' (1.829 m)   Body mass index is 26.31 kg/m.  Physical Exam Physical Exam Constitutional:      General: He is not in acute distress.    Appearance: Normal appearance. He is normal weight.  HENT:     Head: Normocephalic and atraumatic.     Nose:     Comments: Covered with a mask secondary to COVID-19 precautions    Mouth/Throat:     Comments: Covered with a mask secondary to COVID-19 precautions Eyes:     General: No scleral icterus.       Right eye: No discharge.        Left eye: No discharge.  Neck:     Comments: No thyromegaly or dominant thyroid masses appreciated. Cardiovascular:     Rate and Rhythm: Normal rate and regular rhythm.     Pulses: Normal pulses.  Pulmonary:     Effort: Pulmonary effort is normal.     Breath sounds: Wheezing present.     Comments: End expiratory Abdominal:     General: Bowel sounds are normal.     Palpations: Abdomen is soft.     Hernia: A hernia is present.     Comments: There is reducible inguinal hernia present.  The fascial defect that I am able to palpate seems quite small.  Genitourinary:    Comments: Deferred Musculoskeletal:        General: Tenderness present. No swelling.     Comments: Scar over the right knee consistent with total knee arthroplasty.  Lymphadenopathy:     Cervical: No cervical adenopathy.  Skin:    General: Skin is warm and dry.     Comments: Multiple tattoos  Neurological:      General: No focal deficit present.     Mental Status: He is alert and oriented to person, place, and time.  Psychiatric:        Mood and Affect: Mood normal.        Behavior: Behavior normal.     Data Reviewed There are no relevant data available to review  Assessment This is a 48 year old man with an umbilical hernia.  It has become more symptomatic and he desires surgical repair.  Plan I recommended that he undergo open umbilical hernia repair.  This may or may not involve the use of mesh, depending on the size of the defect identified at surgery.  The risks of the operation were discussed with him.  These include, but are not limited to, bleeding, infection, injury to bowel or other structures within the hernia, mesh complications including infection, shifting, or pain, risk of recurrence, need for additional procedures.  He understands that his smoking makes his risk of recurrent somewhat higher.  He accepts the risks and wishes to proceed.  We will get him scheduled for an operation.    [name] 10/23/2019, 3:05 PM

## 2019-10-27 ENCOUNTER — Other Ambulatory Visit: Payer: Self-pay

## 2019-10-27 ENCOUNTER — Telehealth: Payer: Self-pay | Admitting: Family Medicine

## 2019-10-27 ENCOUNTER — Telehealth: Payer: Self-pay

## 2019-10-27 ENCOUNTER — Encounter: Payer: Self-pay | Admitting: Family Medicine

## 2019-10-27 ENCOUNTER — Ambulatory Visit (INDEPENDENT_AMBULATORY_CARE_PROVIDER_SITE_OTHER): Payer: Medicaid Other | Admitting: Family Medicine

## 2019-10-27 DIAGNOSIS — R059 Cough, unspecified: Secondary | ICD-10-CM

## 2019-10-27 DIAGNOSIS — R05 Cough: Secondary | ICD-10-CM | POA: Diagnosis not present

## 2019-10-27 DIAGNOSIS — Z20822 Contact with and (suspected) exposure to covid-19: Secondary | ICD-10-CM

## 2019-10-27 MED ORDER — PREDNISONE 50 MG PO TABS: 50.0000 mg | ORAL_TABLET | Freq: Every day | ORAL | 0 refills | Status: DC

## 2019-10-27 MED ORDER — HYDROCOD POLST-CPM POLST ER 10-8 MG/5ML PO SUER: 5.0000 mL | Freq: Every evening | ORAL | 0 refills | Status: DC | PRN

## 2019-10-27 MED ORDER — BENZONATATE 200 MG PO CAPS: 200.0000 mg | ORAL_CAPSULE | Freq: Two times a day (BID) | ORAL | 0 refills | Status: DC | PRN

## 2019-10-27 NOTE — Telephone Encounter (Signed)
Pt has been advised of pre admission date/time, Covid Testing date and Surgery date.  Surgery Date: 11/07/19 Preadmission Testing Date: 10/31/19 1-5 phone  Covid Testing Date: 10/31/19 - patient advised to go to the [facility] between 8-10:30 am.   Earl Lites Video sent via [facility] Surgical Video and Covid Education Video.  Patient has been made aware to call [phone], between 1-3:00pm the day before surgery, to find out what time to arrive.

## 2019-10-27 NOTE — Telephone Encounter (Signed)
He can pick up OTC delsym. I don't think any cough medicines are covered by [facility]

## 2019-10-27 NOTE — Telephone Encounter (Signed)
Patient notified

## 2019-10-27 NOTE — Telephone Encounter (Signed)
Routing to provider  

## 2019-10-27 NOTE — Telephone Encounter (Signed)
Pt states the following medications were called in for him today and they are not covered under Medicaid.  Pt wants to know if something else can be called in. chlorpheniramine-HYDROcodone (TUSSIONEX PENNKINETIC ER) 10-8 MG/5ML SUER    diphenhydramine-acetaminophen (TYLENOL PM) 25-500 MG TABS tablet   Pt uses  [id number] - [place], [place] - [address] [phone] (Phone) [phone] (Fax)

## 2019-10-27 NOTE — Progress Notes (Signed)
There were no vitals taken for this visit.   Subjective:    Patient ID: Johnny Jewett., male    DOB: [birth year], 48 y.o.   MRN: [id number]  HPI: Johnny Lewandoski. is a 48 y.o. male  Chief Complaint  Patient presents with  . URI    X 3 days, runny nose, cough, wheezing, chills and sweats   UPPER RESPIRATORY TRACT INFECTION Duration: 3 days Worst symptom: cough, sore throa Fever: yes Cough: yes Shortness of breath: yes Wheezing: yes Chest pain: no Chest tightness: yes Chest congestion: yes Nasal congestion: yes Runny nose: yes Post nasal drip: yes Sneezing: no Sore throat: yes Swollen glands: no Sinus pressure: yes Headache: yes Face pain: yes Toothache: no Ear pain: no  Ear pressure: no  Eyes red/itching:no Eye drainage/crusting: no  Vomiting: no Rash: no Fatigue: yes Sick contacts: no Strep contacts: no  Context: worse Recurrent sinusitis: no Relief with OTC cold/cough medications: no  Treatments attempted: none   Relevant past medical, surgical, family and social history reviewed and updated as indicated. Interim medical history since our last visit reviewed. Allergies and medications reviewed and updated.  Review of Systems  Constitutional: Positive for chills, diaphoresis, fatigue and fever. Negative for activity change, appetite change and unexpected weight change.  HENT: Positive for congestion, postnasal drip, rhinorrhea and sore throat. Negative for dental problem, drooling, ear discharge, ear pain, facial swelling, hearing loss, mouth sores, nosebleeds, sinus pressure, sinus pain, sneezing, tinnitus, trouble swallowing and voice change.   Respiratory: Positive for cough, chest tightness, shortness of breath and wheezing. Negative for apnea, choking and stridor.   Cardiovascular: Negative.   Gastrointestinal: Negative.   Musculoskeletal: Positive for myalgias. Negative for arthralgias, back pain, gait problem, joint swelling, neck pain  and neck stiffness.  Skin: Negative.   Psychiatric/Behavioral: Negative.     Per HPI unless specifically indicated above     Objective:    There were no vitals taken for this visit.  Wt Readings from Last 3 Encounters:  10/23/19 194 lb (88 kg)  10/09/19 188 lb (85.3 kg)  10/07/19 190 lb (86.2 kg)    Physical Exam Vitals signs and nursing note reviewed.  Constitutional:      General: He is not in acute distress.    Appearance: Normal appearance. He is not ill-appearing, toxic-appearing or diaphoretic.  HENT:     Head: Normocephalic and atraumatic.     Right Ear: External ear normal.     Left Ear: External ear normal.     Nose: Nose normal.     Mouth/Throat:     Mouth: Mucous membranes are moist.     Pharynx: Oropharynx is clear.  Eyes:     General: No scleral icterus.       Right eye: No discharge.        Left eye: No discharge.     Conjunctiva/sclera: Conjunctivae normal.     Pupils: Pupils are equal, round, and reactive to light.  Neck:     Musculoskeletal: Normal range of motion.  Pulmonary:     Effort: Pulmonary effort is normal. No respiratory distress.     Comments: Speaking in full sentences Musculoskeletal: Normal range of motion.  Skin:    Coloration: Skin is not jaundiced or pale.     Findings: No bruising, erythema, lesion or rash.  Neurological:     Mental Status: He is alert and oriented to person, place, and time. Mental status is at baseline.  Psychiatric:  Mood and Affect: Mood normal.        Behavior: Behavior normal.        Thought Content: Thought content normal.        Judgment: Judgment normal.     Results for orders placed or performed during the hospital encounter of [id number]  Basic metabolic panel  Result Value Ref Range   Sodium 138 135 - 145 mmol/L   Potassium 3.3 (L) 3.5 - 5.1 mmol/L   Chloride 104 98 - 111 mmol/L   CO2 24 22 - 32 mmol/L   Glucose, Bld 114 (H) 70 - 99 mg/dL   BUN 15 6 - 20 mg/dL   Creatinine, Ser 1.06 0.61  - 1.24 mg/dL   Calcium 8.9 8.9 - 10.3 mg/dL   GFR calc non Af Amer >60 >60 mL/min   GFR calc Af Amer >60 >60 mL/min   Anion gap 10 5 - 15  CBC  Result Value Ref Range   WBC 7.6 4.0 - 10.5 K/uL   RBC 5.22 4.22 - 5.81 MIL/uL   Hemoglobin 16.6 13.0 - 17.0 g/dL   HCT 47.9 39.0 - 52.0 %   MCV 91.8 80.0 - 100.0 fL   MCH 31.8 26.0 - 34.0 pg   MCHC 34.7 30.0 - 36.0 g/dL   RDW 12.7 11.5 - 15.5 %   Platelets 222 150 - 400 K/uL   nRBC 0.0 0.0 - 0.2 %  Troponin I (High Sensitivity)  Result Value Ref Range   Troponin I (High Sensitivity) 4 <18 ng/L      Assessment & Plan:   Problem List Items Addressed This Visit    None    Visit Diagnoses    Cough    -  Primary   Will treat symptomatically with tessalon, tussionex and prednisone. Will check COVID testing- self-quarantine until results back. Call with any concerns.    Relevant Orders   Novel Coronavirus, NAA ([facility])       Follow up plan: Return if symptoms worsen or fail to improve.   . This visit was completed via [employer] due to the restrictions of the COVID-19 pandemic. All issues as above were discussed and addressed. Physical exam was done as above through visual confirmation on Doximity. If it was felt that the patient should be evaluated in the office, they were directed there. The patient verbally consented to this visit. . Location of the patient: home . Location of the provider: home . Those involved with this call:  . Provider: Park Liter, DO . CMA: Tiffany Reel, CMA . Front Desk/Registration: Don Perking  . Time spent on call: 15 minutes with patient face to face via video conference. More than 50% of this time was spent in counseling and coordination of care. 23 minutes total spent in review of patient's record and preparation of their chart.

## 2019-10-28 ENCOUNTER — Ambulatory Visit: Payer: Medicaid Other | Attending: Family Medicine

## 2019-10-28 LAB — NOVEL CORONAVIRUS, NAA: SARS-CoV-2, NAA: NOT DETECTED

## 2019-10-29 ENCOUNTER — Telehealth: Payer: Self-pay | Admitting: Family Medicine

## 2019-10-29 NOTE — Telephone Encounter (Signed)
Please let Stepfan know that he tested negative for COVID. Thanks!

## 2019-10-29 NOTE — Telephone Encounter (Signed)
Patient notified

## 2019-10-30 ENCOUNTER — Ambulatory Visit: Payer: Medicaid Other

## 2019-10-30 ENCOUNTER — Other Ambulatory Visit: Payer: Self-pay | Admitting: General Surgery

## 2019-10-30 ENCOUNTER — Other Ambulatory Visit: Payer: Self-pay | Admitting: Family Medicine

## 2019-10-30 DIAGNOSIS — K429 Umbilical hernia without obstruction or gangrene: Secondary | ICD-10-CM

## 2019-10-30 NOTE — Telephone Encounter (Signed)
Requested medication (s) are due for refill today: no  Requested medication (s) are on the active medication list: no  Last refill:  07/26/2019  Future visit scheduled: yes  Notes to clinic: review for refill Looks like dose changed    Requested Prescriptions  Pending Prescriptions Disp Refills   gabapentin (NEURONTIN) 800 MG tablet [Pharmacy Med Name: GABAPENTIN 800 MG TABLET] 270 tablet 1    Sig: TAKE 1 TABLET BY [place]     Neurology: Anticonvulsants - gabapentin Passed - 10/30/2019  1:46 PM      Passed - Valid encounter within last 12 months    Recent Outpatient Visits          3 days ago Cough   [place], Megan P, DO   3 weeks ago Osteoarthritis of spine with radiculopathy, lumbar region   [facility], [place], DO   1 month ago Right sided sciatica   [place], [place], DO   1 month ago Right sided sciatica   [place], DO   2 months ago Lumbar radiculopathy   [place], Barb Merino, DO      Future Appointments            In 1 week Wynetta Emery, Barb Merino, DO [employer]

## 2019-10-30 NOTE — Telephone Encounter (Signed)
Routing to provider  

## 2019-10-31 ENCOUNTER — Other Ambulatory Visit: Payer: Medicaid Other

## 2019-10-31 ENCOUNTER — Other Ambulatory Visit: Payer: Self-pay

## 2019-10-31 ENCOUNTER — Other Ambulatory Visit
Admission: RE | Admit: 2019-10-31 | Discharge: 2019-10-31 | Disposition: A | Discharge status: Home / Self Care | Payer: Medicaid Other | Source: Ambulatory Visit | Attending: General Surgery | Admitting: General Surgery

## 2019-10-31 NOTE — Patient Instructions (Signed)
Your procedure is scheduled on: Friday 11/13 Report to Day Surgery. To find out your arrival time please call [phone] between 1PM - 3PM on Thurs 11/12.  Remember: Instructions that are not followed completely may result in serious medical risk,  up to and including death, or upon the discretion of your surgeon and anesthesiologist your  surgery may need to be rescheduled.     _X__ 1. Do not eat food after midnight the night before your procedure.                 No gum chewing or hard candies. You may drink clear liquids up to 2 hours                 before you are scheduled to arrive for your surgery- DO not drink clear                 liquids within 2 hours of the start of your surgery.                 Clear Liquids include:  water, apple juice without pulp, clear carbohydrate                 drink such as Clearfast of Gatorade, Black Coffee or Tea (Do not add                 anything to coffee or tea).  __X__2.  On the morning of surgery brush your teeth with toothpaste and water, you                may rinse your mouth with mouthwash if you wish.  Do not swallow any toothpaste of mouthwash.     _X__ 3.  No Alcohol for 24 hours before or after surgery.   _X__ 4.  Do Not Smoke or use e-cigarettes For 24 Hours Prior to Your Surgery.                 Do not use any chewable tobacco products for at least 6 hours prior to                 surgery.  ____  5.  Bring all medications with you on the day of surgery if instructed.   __x__  6.  Notify your doctor if there is any change in your medical condition      (cold, fever, infections).     Do not wear jewelry, make-up, hairpins, clips or nail polish. Do not wear lotions, powders, or perfumes. You may wear deodorant. Do not shave 48 hours prior to surgery. Men may shave face and neck. Do not bring valuables to the hospital.    [facility] is not responsible for any belongings or valuables.  Contacts,  dentures or bridgework may not be worn into surgery. Leave your suitcase in the car. After surgery it may be brought to your room. For patients admitted to the hospital, discharge time is determined by your treatment team.   Patients discharged the day of surgery will not be allowed to drive home.   Please read over the following fact sheets that you were given:     _x___ Take these medicines the morning of surgery with A SIP OF WATER:    1. baclofen (LIORESAL) 10 MG tablet  2. gabapentin (NEURONTIN) 600 MG tablet  3. gabapentin (NEURONTIN) 800 MG tablet  4.oxyCODONE (OXY IR/ROXICODONE) 5 MG immediate release tablet if needed  5.  6.  ____ Fleet Enema (as directed)   _x___ Use CHG Soap as directed  ____ Use inhalers on the day of surgery  ____ Stop metformin 2 days prior to surgery    ____ Take 1/2 of usual insulin dose the night before surgery. No insulin the morning          of surgery.   ____ Stop Coumadin/Plavix/aspirin on   _x___ Stop Anti-inflammatories ibuprofen (ADVIL) 200 MG tablet   ____ Stop supplements until after surgery.    ____ Bring C-Pap to the hospital.

## 2019-11-01 ENCOUNTER — Other Ambulatory Visit: Payer: Self-pay

## 2019-11-01 DIAGNOSIS — Z79899 Other long term (current) drug therapy: Secondary | ICD-10-CM | POA: Diagnosis not present

## 2019-11-01 DIAGNOSIS — M545 Low back pain: Secondary | ICD-10-CM | POA: Diagnosis present

## 2019-11-01 DIAGNOSIS — F17210 Nicotine dependence, cigarettes, uncomplicated: Secondary | ICD-10-CM | POA: Diagnosis not present

## 2019-11-01 DIAGNOSIS — M5441 Lumbago with sciatica, right side: Secondary | ICD-10-CM | POA: Diagnosis not present

## 2019-11-01 DIAGNOSIS — Z91040 Latex allergy status: Secondary | ICD-10-CM | POA: Diagnosis not present

## 2019-11-01 LAB — CBC WITH DIFFERENTIAL/PLATELET
Abs Immature Granulocytes: 0.04 10*3/uL (ref 0.00–0.07)
Basophils Absolute: 0 10*3/uL (ref 0.0–0.1)
Basophils Relative: 0 %
Eosinophils Absolute: 0.1 10*3/uL (ref 0.0–0.5)
Eosinophils Relative: 1 %
HCT: 44.9 % (ref 39.0–52.0)
Hemoglobin: 16.7 g/dL (ref 13.0–17.0)
Immature Granulocytes: 0 %
Lymphocytes Relative: 37 %
Lymphs Abs: 4.4 10*3/uL — ABNORMAL HIGH (ref 0.7–4.0)
MCH: 32.1 pg (ref 26.0–34.0)
MCHC: 37.2 g/dL — ABNORMAL HIGH (ref 30.0–36.0)
MCV: 86.2 fL (ref 80.0–100.0)
Monocytes Absolute: 0.8 10*3/uL (ref 0.1–1.0)
Monocytes Relative: 7 %
Neutro Abs: 6.7 10*3/uL (ref 1.7–7.7)
Neutrophils Relative %: 55 %
Platelets: 243 10*3/uL (ref 150–400)
RBC: 5.21 MIL/uL (ref 4.22–5.81)
RDW: 12 % (ref 11.5–15.5)
WBC: 12 10*3/uL — ABNORMAL HIGH (ref 4.0–10.5)
nRBC: 0 % (ref 0.0–0.2)

## 2019-11-01 LAB — BASIC METABOLIC PANEL
Anion gap: 13 (ref 5–15)
BUN: 15 mg/dL (ref 6–20)
CO2: 23 mmol/L (ref 22–32)
Calcium: 9 mg/dL (ref 8.9–10.3)
Chloride: 101 mmol/L (ref 98–111)
Creatinine, Ser: 1.11 mg/dL (ref 0.61–1.24)
GFR calc Af Amer: >60 mL/min (ref >60)
GFR calc non Af Amer: >60 mL/min (ref >60)
Glucose, Bld: 109 mg/dL — ABNORMAL HIGH (ref 70–99)
Potassium: 3.3 mmol/L — ABNORMAL LOW (ref 3.5–5.1)
Sodium: 137 mmol/L (ref 135–145)

## 2019-11-01 NOTE — ED Triage Notes (Addendum)
Patient reports having lower back and right leg pain.  Patient reports has appointment scheduled with surgeon on 11/16 but unable to get pain under control.  Patient also reports feeling wheezy, states COVID test 4 days ago was negative.  Also reports he has only urinated x 2 today.

## 2019-11-01 NOTE — ED Notes (Signed)
Patient presents with complaints of weakness and cough. Had negative covid test 3-4 days ago. Denies fever at home. No obvious shortness of breath noted.

## 2019-11-02 ENCOUNTER — Emergency Department
Admission: EM | Admit: 2019-11-02 | Discharge: 2019-11-02 | Disposition: A | Discharge status: Home / Self Care | Payer: Medicaid Other | Attending: Emergency Medicine | Admitting: Emergency Medicine

## 2019-11-02 DIAGNOSIS — M5431 Sciatica, right side: Secondary | ICD-10-CM

## 2019-11-02 LAB — URINALYSIS, COMPLETE (UACMP) WITH MICROSCOPIC
Bacteria, UA: NONE SEEN
Bilirubin Urine: NEGATIVE
Glucose, UA: NEGATIVE mg/dL
Hgb urine dipstick: NEGATIVE
Ketones, ur: NEGATIVE mg/dL
Leukocytes,Ua: NEGATIVE
Nitrite: NEGATIVE
Protein, ur: NEGATIVE mg/dL
Specific Gravity, Urine: 1.019 (ref 1.005–1.030)
pH: 6 (ref 5.0–8.0)

## 2019-11-02 MED ORDER — GABAPENTIN 600 MG PO TABS
600.0000 mg | ORAL_TABLET | Freq: Once | ORAL | Status: AC
  Administered 2019-11-02: 600 mg via ORAL

## 2019-11-02 MED ORDER — KETOROLAC TROMETHAMINE 10 MG PO TABS: 10.0000 mg | ORAL_TABLET | Freq: Four times a day (QID) | ORAL | 0 refills | Status: DC | PRN

## 2019-11-02 MED ORDER — GABAPENTIN 600 MG PO TABS
300.0000 mg | ORAL_TABLET | Freq: Once | ORAL | Status: DC
  Filled 2019-11-02: qty 1

## 2019-11-02 MED ORDER — KETOROLAC TROMETHAMINE 60 MG/2ML IM SOLN
60.0000 mg | Freq: Once | INTRAMUSCULAR | Status: AC
  Administered 2019-11-02: 60 mg via INTRAMUSCULAR
  Filled 2019-11-02: qty 2

## 2019-11-02 MED ORDER — GABAPENTIN 600 MG PO TABS: 600.0000 mg | ORAL_TABLET | Freq: Three times a day (TID) | ORAL | 0 refills | Status: DC

## 2019-11-02 MED ORDER — ACETAMINOPHEN 500 MG PO TABS
1000.0000 mg | ORAL_TABLET | Freq: Once | ORAL | Status: AC
  Administered 2019-11-02: 1000 mg via ORAL
  Filled 2019-11-02: qty 2

## 2019-11-02 NOTE — Discharge Instructions (Signed)
You have been seen in the Emergency Department (ED)  today for back pain.  Back pain has many possible causes some are related to muscles while others have more serious causes. Even though you were checked carefully today and your exam and evaluation were reassuring, problems may develop later or continue to unfold. Therefore it is imperative that you follow up with doctor closely for further evaluation.  Follow-up with your doctor in 1 day for further evaluation.  For pain control take: tylenol 1000mg  every 8 hours. Ibuprofen 800mg  every 6 hours. Gabapentin 600mg  three times a day  When should you call for help?  Call your doctor now or seek immediate medical care if:  You have new or worsening numbness in your legs.  You have new or worsening weakness in your legs. (This could make it hard to stand up.)  You lose control of your bladder or bowels or if you are unable to urinate. You have numbness of your groin or buttock region If you develop a fever  Watch closely for changes in your health, and be sure to contact your doctor if:  Your pain gets worse.  You are not getting better after 2 weeks.  How can you care for yourself at home?  Take pain medicines exactly as directed.  If the doctor gave you a prescription medicine for pain, take it as prescribed.  If you are not taking a prescription pain medicine, ask your doctor if you can take an over-the-counter medicine like tylenol or ibuprofen. Sit or lie in positions that are most comfortable and reduce your pain. Try one of these positions when you lie down:  Lie on your back with your knees bent and supported by large pillows.  Lie on the floor with your legs on the seat of a sofa or chair.  Lie on your side with your knees and hips bent and a pillow between your legs.  Lie on your stomach if it does not make pain worse. Do not sit up in bed, and avoid soft couches and twisted positions. Bed rest can help relieve pain at first, but it  delays healing. Avoid bed rest after the first day of back pain.  Change positions every 30 minutes. If you must sit for long periods of time, take breaks from sitting. Get up and walk around, or lie in a comfortable position.  Try using a heating pad on a low or medium setting for 15 to 20 minutes every 2 or 3 hours. Try a warm shower in place of one session with the heating pad.  You can also try an ice pack for 10 to 15 minutes every 2 to 3 hours. Put a thin cloth between the ice pack and your skin.  Take short walks several times a day. You can start with 5 to 10 minutes, 3 or 4 times a day, and work up to longer walks. Walk on level surfaces and avoid hills and stairs until your back is better.  Return to work and other activities as soon as you can. Continued rest without activity is usually not good for your back.  To prevent future back pain, do exercises to stretch and strengthen your back and stomach. Learn how to use good posture, safe lifting techniques, and proper body mechanics.

## 2019-11-02 NOTE — ED Provider Notes (Signed)
[facility] Emergency Department Provider Note  ____________________________________________  Time seen: Approximately 1:12 AM  I have reviewed the triage vital signs and the nursing notes.   HISTORY  Chief Complaint Back Pain   HPI Johnny [name]. is a 48 y.o. male the history of chronic back pain, chronic neck pain, bilateral sciatica, frequent visits to the emergency department for various pain complaints who presents for evaluation of right sided sciatica pain.  Patient reports that he was going down the steps today when he stepped in a funny position and caused an exacerbation of his right sciatica pain.  The pain is sharp located in his right buttock radiating down his leg.  No saddle anesthesia, no lower extremity weakness or numbness, no urinary or bowel incontinence or retention.  Patient reports having intermittent wheezing with a mild dry cough for the last week.  No wheezing at this time.  He is a smoker.  Denies history of COPD or asthma.  He denies any chest pain, shortness of breath, fever.  When his symptoms started he had a negative Covid test.   Past Medical History:  Diagnosis Date   Arthritis    hands, knees, lower back   Bilateral sciatica    Carpal tunnel syndrome    Chronic back pain    Chronic neck pain    Chronic pain syndrome    GERD (gastroesophageal reflux disease)    Hand joint pain 07/02/2013   Herniated disc    Traumatic amputation of left index finger 2013   Wears dentures    full upper and lower    Patient Active Problem List   Diagnosis Date Noted   Umbilical hernia without obstruction and without gangrene 10/23/2019   Osteoarthritis of spine with radiculopathy, lumbar region 10/09/2019   Chronic bilateral low back pain with bilateral sciatica 10/07/2019   S/P cervical spinal fusion (C5-C6 ACDF) 07/15/2019   Cervical radicular pain 07/15/2019   Cervical facet joint syndrome 07/15/2019    Degeneration of lumbar intervertebral disc 07/03/2019   Lumbar radicular pain 07/03/2019   Cervical spondylosis 04/17/2019   Homeless single person 04/17/2019   History of total knee replacement, right 04/25/2018   Pancreatic mass 01/14/2018   Tobacco abuse 01/14/2018   Chronic neck pain    Benign neoplasm of descending colon    Benign neoplasm of ascending colon    Intractable vomiting with nausea    Reflux esophagitis    Duodenal ulcer without hemorrhage or perforation    Chronic pain syndrome 08/07/2017   Vitamin D deficiency 06/20/2017   Insomnia 06/19/2017   Right sided sciatica    Presence of artificial knee joint 01/17/2013   Knee pain 01/17/2013   Neuropathic pain of hand 11/29/2012   Traumatic amputation of finger 09/20/2012    Past Surgical History:  Procedure Laterality Date   AMPUTATION FINGER / THUMB Left 2013   COLONOSCOPY WITH PROPOFOL N/A 01/03/2018   Procedure: COLONOSCOPY WITH PROPOFOL;  Surgeon: [name], MD;  Location: [place];  Service: Endoscopy;  Laterality: N/A;   ESOPHAGOGASTRODUODENOSCOPY (EGD) WITH PROPOFOL N/A 01/03/2018   Procedure: ESOPHAGOGASTRODUODENOSCOPY (EGD) WITH PROPOFOL;  Surgeon: [name], MD;  Location: [place];  Service: Endoscopy;  Laterality: N/A;   HERNIA REPAIR Right    right inguinal hernia repaired twice   HIP SURGERY     JOINT REPLACEMENT Right    knee   KNEE SURGERY Right    neck fusion     POLYPECTOMY N/A  01/03/2018   Procedure: POLYPECTOMY;  Surgeon: [name], MD;  Location: [place];  Service: Endoscopy;  Laterality: N/A;   REPLACEMENT TOTAL KNEE Right    SHOULDER SURGERY Right 2016 X 2   SPINAL FUSION     TONSILLECTOMY      Prior to Admission medications   Medication Sig Start Date End Date Taking? Authorizing Provider  baclofen (LIORESAL) 10 MG tablet Take 10 mg by mouth 2 (two) times daily.    [provider]  benzonatate  (TESSALON) 200 MG capsule Take 1 capsule (200 mg total) by mouth 2 (two) times daily as needed for cough. Patient not taking: Reported on 10/31/2019 10/27/19   Park Liter P, DO  chlorpheniramine-HYDROcodone (TUSSIONEX PENNKINETIC ER) 10-8 MG/5ML SUER Take 5 mLs by mouth at bedtime as needed. 10/27/19   Johnson, Megan P, DO  diphenhydramine-acetaminophen (TYLENOL PM) 25-500 MG TABS tablet Take 2 tablets by mouth at bedtime.    [provider]  gabapentin (NEURONTIN) 600 MG tablet Take 2 tablets (1,200 mg total) by mouth 3 (three) times daily. 07/31/19   Johnson, Megan P, DO  gabapentin (NEURONTIN) 600 MG tablet Take 1 tablet (600 mg total) by mouth 3 (three) times daily for 7 days. 11/02/19 11/09/19  Rudene Re, MD  gabapentin (NEURONTIN) 800 MG tablet TAKE 1 TABLET BY MOUTH THREE TIMES A DAY 10/30/19   Johnson, Megan P, DO  ibuprofen (ADVIL) 200 MG tablet Take 600 mg by mouth daily as needed (pain.).    [provider]  ketorolac (TORADOL) 10 MG tablet Take 1 tablet (10 mg total) by mouth every 6 (six) hours as needed. 11/02/19   Rudene Re, MD  oxyCODONE (OXY IR/ROXICODONE) 5 MG immediate release tablet Take 1 tablet (5 mg total) by mouth 3 (three) times daily as needed for up to 28 days for breakthrough pain. Patient taking differently: Take 5 mg by mouth 3 (three) times daily as needed (for pain).  10/09/19 11/06/19  Park Liter P, DO  predniSONE (DELTASONE) 50 MG tablet Take 1 tablet (50 mg total) by mouth daily with breakfast. Dx: cough 10/27/19   Johnson, Megan P, DO    Allergies Crab [shellfish allergy], Flexeril [cyclobenzaprine], Codeine, Latex, Penicillins, Robaxin [methocarbamol], Sulfamethoxazole-trimethoprim, and Tramadol  Family History  Problem Relation Age of Onset   Cancer Father        sarcoma   Cirrhosis Paternal Grandmother    Cancer Paternal Grandfather        Pancreatic   Fibromyalgia Sister    Deafness Sister     Social  History Social History   Tobacco Use   Smoking status: Current Every Day Smoker    Packs/day: 0.50    Years: 25.00    Pack years: 12.50    Types: Cigarettes   Smokeless tobacco: Never Used  Substance Use Topics   Alcohol use: No   Drug use: No    Review of Systems  Constitutional: Negative for fever. Eyes: Negative for visual changes. ENT: Negative for sore throat. Neck: No neck pain  Cardiovascular: Negative for chest pain. Respiratory: Negative for shortness of breath. + cough, wheezing Gastrointestinal: Negative for abdominal pain, vomiting or diarrhea. Genitourinary: Negative for dysuria. Musculoskeletal: + back pain. Skin: Negative for rash. Neurological: Negative for headaches, weakness or numbness. Psych: No SI or HI  ____________________________________________   PHYSICAL EXAM:  VITAL SIGNS: Vitals:   11/01/19 2057 11/02/19 0051  BP: (!) 184/117 (!) 179/112  Pulse: (!) 111 80  Resp: (!) 22  20  Temp: 98.2 F (36.8 C)   SpO2: 98% 97%    Constitutional: Alert and oriented. Well appearing and in no apparent distress. HEENT:      Head: Normocephalic and atraumatic.         Eyes: Conjunctivae are normal. Sclera is non-icteric.       Mouth/Throat: Mucous membranes are moist.       Neck: Supple with no signs of meningismus. Cardiovascular: Regular rate and rhythm. No murmurs, gallops, or rubs. 2+ symmetrical distal pulses are present in all extremities. No JVD. Respiratory: Normal respiratory effort. Lungs are clear to auscultation bilaterally. No wheezes, crackles, or rhonchi.  Gastrointestinal: Soft, non tender, and non distended with positive bowel sounds. No rebound or guarding.  No CVA tenderness. Musculoskeletal: No midline CT and L-spine tenderness, tenderness to palpation over the sciatic notch on the right, strong DP and PT pulses with brisk capillary refill and warm extremities. Neurologic: Normal speech and language. Face is symmetric.  Intact  strength and sensation of bilateral lower extremities with 2+ patella DTR Skin: Skin is warm, dry and intact. No rash noted. Psychiatric: Mood and affect are normal. Speech and behavior are normal.  ____________________________________________   LABS (all labs ordered are listed, but only abnormal results are displayed)  Labs Reviewed  CBC WITH DIFFERENTIAL/PLATELET - Abnormal; Notable for the following components:      Result Value   WBC 12.0 (*)    MCHC 37.2 (*)    Lymphs Abs 4.4 (*)    All other components within normal limits  BASIC METABOLIC PANEL - Abnormal; Notable for the following components:   Potassium 3.3 (*)    Glucose, Bld 109 (*)    All other components within normal limits  URINALYSIS, COMPLETE (UACMP) WITH MICROSCOPIC - Abnormal; Notable for the following components:   Color, Urine YELLOW (*)    APPearance CLEAR (*)    All other components within normal limits   ____________________________________________  EKG  none  ____________________________________________  RADIOLOGY  none  ____________________________________________   PROCEDURES  Procedure(s) performed: None Procedures Critical Care performed:  None ____________________________________________   INITIAL IMPRESSION / ASSESSMENT AND PLAN / ED COURSE   48 y.o. male the history of chronic back pain, chronic neck pain, bilateral sciatica, frequent visits to the emergency department for various pain complaints who presents for evaluation of right sided sciatica pain.  Patient presenting with acute on chronic sciatica pain.  Is scheduled to see his neurosurgeon in a week.  Review of [place] controlled database shows several prescriptions for narcotics including most recently 84 x 5 mg oxycodone 3 weeks ago.   No history of IV drug use, no fever, no night sweats, no unintentional weight loss therefore low suspicion for discitis or abscess.  No abdominal pain or tenderness with low suspicion for  AAA or dissection.  Clinical Course as of Nov 02 203  Nancy Fetter Nov 02, 2019  0203 Patient reports feeling markedly improved after pain medications in the emergency room.  He is requesting a prescription for Toradol which I will send to the pharmacy.  Also recommended taking gabapentin.  Discussed my standard return precautions and follow-up with PCP.   [CV]    Clinical Course User Index [CV] Alfred Levins, [place], MD   History overall reassuring with the absence of red flags including:  - Age < 8 and > 15 years old - Rapid progression of symptoms - Traumatic event, even minor trauma - Systemic symptoms including fevers, chills, weight loss -  Recent bacterial infection - Unremitting pain or pain at night with rest - Numbness, weakness, difficulty walking, urinary retention or bowel incontinence - Personal history of cancer, immunosuppression, diabetes, known AAA, or renal colic - Personal history of IV drug use.    Exam overall reassuring with symmetric and intact lower extremity strength, sensation, and reflexes without clonus, 2+ symmetric medial malleolar and dorsalis pedis pulses, no ttp over vertebral bodies, no pulsatile abdominal mass.  Based on history and physical, I have a very low suspicion of a concerning etiology of pain including epidural compression syndrome, spinal infection, transverse myelitis, malignancy, abdominal aortic aneurysm, renal colic, acute lower extremity claudication, neurogenic claudication, ankylosing spondylitis, or other intra-abdominal process.  Will treat with IM toradol, gabapentin, and PO tylenol. Will provide prescription for gabapentin.  Recommended close follow-up with his neurosurgeon and discussed my standard return precautions.  For his wheezing patient has no wheezing at this time.  Most likely associated with his smoking.  He has no fever, sore throat, body aches, loss of taste or smell, chest pain, shortness of breath.  Negative Covid swab.   Recommended follow-up with PCP and discussed my standard return precautions   As part of my medical decision making, I reviewed the following data within the [place] notes reviewed and incorporated, Old chart reviewed, Notes from prior ED visits and [place] Controlled Substance Database   Patient was evaluated in Emergency Department today for the symptoms described in the history of present illness. Patient was evaluated in the context of the global COVID-19 pandemic, which necessitated consideration that the patient might be at risk for infection with the SARS-CoV-2 virus that causes COVID-19. Institutional protocols and algorithms that pertain to the evaluation of patients at risk for COVID-19 are in a state of rapid change based on information released by regulatory bodies including the CDC and federal and state organizations. These policies and algorithms were followed during the patient's care in the ED.   ____________________________________________   FINAL CLINICAL IMPRESSION(S) / ED DIAGNOSES   Final diagnoses:  Sciatica of right side      NEW MEDICATIONS STARTED DURING THIS VISIT:  ED Discharge Orders         Ordered    gabapentin (NEURONTIN) 600 MG tablet  3 times daily     11/02/19 0121    ketorolac (TORADOL) 10 MG tablet  Every 6 hours PRN     11/02/19 0204           Note:  This document was prepared using Dragon voice recognition software and may include unintentional dictation errors.    Rudene Re, MD 11/02/19 [phone]

## 2019-11-04 ENCOUNTER — Other Ambulatory Visit
Admission: RE | Admit: 2019-11-04 | Discharge: 2019-11-04 | Disposition: A | Discharge status: Home / Self Care | Payer: Medicaid Other | Source: Ambulatory Visit | Attending: General Surgery | Admitting: General Surgery

## 2019-11-04 DIAGNOSIS — Z01812 Encounter for preprocedural laboratory examination: Secondary | ICD-10-CM | POA: Insufficient documentation

## 2019-11-04 DIAGNOSIS — Z20828 Contact with and (suspected) exposure to other viral communicable diseases: Secondary | ICD-10-CM | POA: Insufficient documentation

## 2019-11-04 LAB — SARS CORONAVIRUS 2 (TAT 6-24 HRS): SARS Coronavirus 2: NEGATIVE

## 2019-11-05 ENCOUNTER — Ambulatory Visit: Payer: Medicaid Other

## 2019-11-07 ENCOUNTER — Encounter: Payer: Self-pay | Admitting: *Deleted

## 2019-11-07 ENCOUNTER — Other Ambulatory Visit: Payer: Self-pay | Admitting: Family Medicine

## 2019-11-07 ENCOUNTER — Ambulatory Visit: Payer: Medicaid Other | Admitting: Anesthesiology

## 2019-11-07 ENCOUNTER — Encounter
Admission: RE | Disposition: A | Discharge status: Home / Self Care | Payer: Self-pay | Source: Home / Self Care | Attending: General Surgery

## 2019-11-07 ENCOUNTER — Ambulatory Visit
Admission: RE | Admit: 2019-11-07 | Discharge: 2019-11-07 | Disposition: A | Discharge status: Home / Self Care | Payer: Medicaid Other | Attending: General Surgery | Admitting: General Surgery

## 2019-11-07 ENCOUNTER — Other Ambulatory Visit: Payer: Self-pay

## 2019-11-07 DIAGNOSIS — Z89012 Acquired absence of left thumb: Secondary | ICD-10-CM | POA: Diagnosis not present

## 2019-11-07 DIAGNOSIS — Z96651 Presence of right artificial knee joint: Secondary | ICD-10-CM | POA: Diagnosis not present

## 2019-11-07 DIAGNOSIS — F17210 Nicotine dependence, cigarettes, uncomplicated: Secondary | ICD-10-CM | POA: Diagnosis not present

## 2019-11-07 DIAGNOSIS — K420 Umbilical hernia with obstruction, without gangrene: Secondary | ICD-10-CM | POA: Insufficient documentation

## 2019-11-07 DIAGNOSIS — K429 Umbilical hernia without obstruction or gangrene: Secondary | ICD-10-CM | POA: Diagnosis not present

## 2019-11-07 HISTORY — PX: UMBILICAL HERNIA REPAIR: SHX196

## 2019-11-07 SURGERY — REPAIR, HERNIA, UMBILICAL, ADULT
Anesthesia: General

## 2019-11-07 MED ORDER — FENTANYL CITRATE (PF) 100 MCG/2ML IJ SOLN: 25.0000 ug | INTRAMUSCULAR | Status: DC | PRN

## 2019-11-07 MED ORDER — OXYCODONE HCL 5 MG PO TABS
ORAL_TABLET | ORAL | Status: AC
  Filled 2019-11-07: qty 1

## 2019-11-07 MED ORDER — OXYCODONE HCL 5 MG PO TABS
5.0000 mg | ORAL_TABLET | Freq: Once | ORAL | Status: AC
  Administered 2019-11-07: 5 mg via ORAL

## 2019-11-07 MED ORDER — BUPIVACAINE LIPOSOME 1.3 % IJ SUSP
INTRAMUSCULAR | Status: AC
  Filled 2019-11-07: qty 20

## 2019-11-07 MED ORDER — CHLORHEXIDINE GLUCONATE CLOTH 2 % EX PADS: 6.0000 | MEDICATED_PAD | Freq: Once | CUTANEOUS | Status: DC

## 2019-11-07 MED ORDER — PROPOFOL 10 MG/ML IV BOLUS
INTRAVENOUS | Status: AC
  Filled 2019-11-07: qty 20

## 2019-11-07 MED ORDER — SUGAMMADEX SODIUM 500 MG/5ML IV SOLN
INTRAVENOUS | Status: DC | PRN
  Administered 2019-11-07: 200 mg via INTRAVENOUS

## 2019-11-07 MED ORDER — BUPIVACAINE HCL (PF) 0.25 % IJ SOLN
INTRAMUSCULAR | Status: DC | PRN
  Administered 2019-11-07: 10 mL

## 2019-11-07 MED ORDER — LACTATED RINGERS IV SOLN
INTRAVENOUS | Status: DC
  Administered 2019-11-07 (×2): via INTRAVENOUS

## 2019-11-07 MED ORDER — PROPOFOL 10 MG/ML IV BOLUS
INTRAVENOUS | Status: DC | PRN
  Administered 2019-11-07: 200 mg via INTRAVENOUS

## 2019-11-07 MED ORDER — SEVOFLURANE IN SOLN
RESPIRATORY_TRACT | Status: AC
  Filled 2019-11-07: qty 250

## 2019-11-07 MED ORDER — FAMOTIDINE 20 MG PO TABS
20.0000 mg | ORAL_TABLET | Freq: Once | ORAL | Status: AC
  Administered 2019-11-07: 07:00:00 20 mg via ORAL

## 2019-11-07 MED ORDER — DEXAMETHASONE SODIUM PHOSPHATE 10 MG/ML IJ SOLN
INTRAMUSCULAR | Status: DC | PRN
  Administered 2019-11-07: 10 mg via INTRAVENOUS

## 2019-11-07 MED ORDER — LIDOCAINE-EPINEPHRINE 1 %-1:100000 IJ SOLN
INTRAMUSCULAR | Status: DC | PRN
  Administered 2019-11-07: 10 mL

## 2019-11-07 MED ORDER — LACTATED RINGERS IV SOLN
INTRAVENOUS | Status: DC | PRN
  Administered 2019-11-07: 07:00:00 via INTRAVENOUS

## 2019-11-07 MED ORDER — LACTATED RINGERS IV BOLUS: 200.0000 mL | Freq: Once | INTRAVENOUS | Status: DC

## 2019-11-07 MED ORDER — FENTANYL CITRATE (PF) 100 MCG/2ML IJ SOLN
INTRAMUSCULAR | Status: AC
  Filled 2019-11-07: qty 2

## 2019-11-07 MED ORDER — LIDOCAINE HCL (CARDIAC) PF 100 MG/5ML IV SOSY
PREFILLED_SYRINGE | INTRAVENOUS | Status: DC | PRN
  Administered 2019-11-07: 100 mg via INTRAVENOUS

## 2019-11-07 MED ORDER — MIDAZOLAM HCL 2 MG/2ML IJ SOLN
INTRAMUSCULAR | Status: AC
  Filled 2019-11-07: qty 2

## 2019-11-07 MED ORDER — ROCURONIUM BROMIDE 100 MG/10ML IV SOLN
INTRAVENOUS | Status: DC | PRN
  Administered 2019-11-07: 10 mg via INTRAVENOUS
  Administered 2019-11-07: 20 mg via INTRAVENOUS

## 2019-11-07 MED ORDER — CEFAZOLIN SODIUM-DEXTROSE 2-4 GM/100ML-% IV SOLN
INTRAVENOUS | Status: AC
  Filled 2019-11-07: qty 100

## 2019-11-07 MED ORDER — FAMOTIDINE 20 MG PO TABS
ORAL_TABLET | ORAL | Status: AC
  Administered 2019-11-07: 07:00:00 20 mg via ORAL
  Filled 2019-11-07: qty 1

## 2019-11-07 MED ORDER — ACETAMINOPHEN 500 MG PO TABS
ORAL_TABLET | ORAL | Status: AC
  Administered 2019-11-07: 07:00:00 1000 mg via ORAL
  Filled 2019-11-07: qty 2

## 2019-11-07 MED ORDER — ACETAMINOPHEN 500 MG PO TABS
1000.0000 mg | ORAL_TABLET | ORAL | Status: AC
  Administered 2019-11-07: 07:00:00 1000 mg via ORAL

## 2019-11-07 MED ORDER — LIDOCAINE-EPINEPHRINE 1 %-1:100000 IJ SOLN
INTRAMUSCULAR | Status: AC
  Filled 2019-11-07: qty 1

## 2019-11-07 MED ORDER — BUPIVACAINE HCL (PF) 0.25 % IJ SOLN
INTRAMUSCULAR | Status: AC
  Filled 2019-11-07: qty 30

## 2019-11-07 MED ORDER — KETOROLAC TROMETHAMINE 30 MG/ML IJ SOLN
INTRAMUSCULAR | Status: DC | PRN
  Administered 2019-11-07: 30 mg via INTRAVENOUS

## 2019-11-07 MED ORDER — BUPIVACAINE LIPOSOME 1.3 % IJ SUSP
INTRAMUSCULAR | Status: DC | PRN
  Administered 2019-11-07: 20 mL

## 2019-11-07 MED ORDER — FENTANYL CITRATE (PF) 100 MCG/2ML IJ SOLN
INTRAMUSCULAR | Status: DC | PRN
  Administered 2019-11-07: 100 ug via INTRAVENOUS

## 2019-11-07 MED ORDER — OXYCODONE HCL 5 MG PO TABS: 5.0000 mg | ORAL_TABLET | Freq: Four times a day (QID) | ORAL | 0 refills | Status: DC | PRN

## 2019-11-07 MED ORDER — MIDAZOLAM HCL 2 MG/2ML IJ SOLN
INTRAMUSCULAR | Status: DC | PRN
  Administered 2019-11-07: 2 mg via INTRAVENOUS

## 2019-11-07 MED ORDER — DEXMEDETOMIDINE HCL 200 MCG/2ML IV SOLN
INTRAVENOUS | Status: DC | PRN
  Administered 2019-11-07: 16 ug via INTRAVENOUS

## 2019-11-07 MED ORDER — SUCCINYLCHOLINE CHLORIDE 20 MG/ML IJ SOLN
INTRAMUSCULAR | Status: DC | PRN
  Administered 2019-11-07: 120 mg via INTRAVENOUS

## 2019-11-07 MED ORDER — CEFAZOLIN SODIUM-DEXTROSE 2-4 GM/100ML-% IV SOLN
2.0000 g | INTRAVENOUS | Status: AC
  Administered 2019-11-07: 2 g via INTRAVENOUS

## 2019-11-07 MED ORDER — ACETAMINOPHEN 500 MG PO TABS: 1000.0000 mg | ORAL_TABLET | Freq: Four times a day (QID) | ORAL | 2 refills | Status: DC

## 2019-11-07 MED ORDER — ONDANSETRON HCL 4 MG/2ML IJ SOLN: 4.0000 mg | Freq: Once | INTRAMUSCULAR | Status: DC | PRN

## 2019-11-07 MED ORDER — BUPIVACAINE LIPOSOME 1.3 % IJ SUSP: 20.0000 mL | Freq: Once | INTRAMUSCULAR | Status: DC

## 2019-11-07 MED ORDER — ONDANSETRON HCL 4 MG/2ML IJ SOLN
INTRAMUSCULAR | Status: DC | PRN
  Administered 2019-11-07: 4 mg via INTRAVENOUS

## 2019-11-07 SURGICAL SUPPLY — 36 items
ADH SKN CLS APL DERMABOND .7 (GAUZE/BANDAGES/DRESSINGS) ×1
APL PRP STRL LF DISP 70% ISPRP (MISCELLANEOUS) ×1
BLADE SURG 15 STRL LF DISP TIS (BLADE) ×1 IMPLANT
BLADE SURG 15 STRL SS (BLADE) ×2
CANISTER SUCT 1200ML W/VALVE (MISCELLANEOUS) ×2 IMPLANT
CHLORAPREP W/TINT 26 (MISCELLANEOUS) ×2 IMPLANT
COVER WAND RF STERILE (DRAPES) ×1 IMPLANT
DERMABOND ADVANCED (GAUZE/BANDAGES/DRESSINGS) ×1
DERMABOND ADVANCED .7 DNX12 (GAUZE/BANDAGES/DRESSINGS) ×1 IMPLANT
DRAPE LAPAROTOMY 77X122 PED (DRAPES) ×2 IMPLANT
ELECT CAUTERY BLADE TIP 2.5 (TIP) ×2
ELECT REM PT RETURN 9FT ADLT (ELECTROSURGICAL) ×2
ELECTRODE CAUTERY BLDE TIP 2.5 (TIP) ×1 IMPLANT
ELECTRODE REM PT RTRN 9FT ADLT (ELECTROSURGICAL) ×1 IMPLANT
GLOVE BIO SURGEON STRL SZ 6.5 (GLOVE) ×1 IMPLANT
GLOVE INDICATOR 7.0 STRL GRN (GLOVE) ×2 IMPLANT
GLOVE SKINSENSE NS SZ6.5 (GLOVE) ×6
GLOVE SKINSENSE STRL SZ6.5 (GLOVE) IMPLANT
GOWN STRL REUS W/ TWL LRG LVL3 (GOWN DISPOSABLE) ×2 IMPLANT
GOWN STRL REUS W/TWL LRG LVL3 (GOWN DISPOSABLE) ×6
KIT TURNOVER KIT A (KITS) ×2 IMPLANT
LABEL OR SOLS (LABEL) ×2 IMPLANT
NEEDLE HYPO 22GX1.5 SAFETY (NEEDLE) ×4 IMPLANT
NS IRRIG 500ML POUR BTL (IV SOLUTION) ×2 IMPLANT
PACK BASIN MINOR ARMC (MISCELLANEOUS) ×2 IMPLANT
PENCIL SMOKE EVACUATOR (MISCELLANEOUS) ×1 IMPLANT
STRIP CLOSURE SKIN 1/2X4 (GAUZE/BANDAGES/DRESSINGS) ×2 IMPLANT
SUT ETHIBOND NAB MO 7 #0 18IN (SUTURE) ×2 IMPLANT
SUT MNCRL 4-0 (SUTURE) ×2
SUT MNCRL 4-0 27XMFL (SUTURE) ×1
SUT VIC AB 3-0 SH 27 (SUTURE) ×2
SUT VIC AB 3-0 SH 27X BRD (SUTURE) ×1 IMPLANT
SUTURE MNCRL 4-0 27XMF (SUTURE) ×1 IMPLANT
SYR 10ML LL (SYRINGE) ×2 IMPLANT
SYR 20ML LL LF (SYRINGE) ×2 IMPLANT
SYR BULB IRRIG 60ML STRL (SYRINGE) ×2 IMPLANT

## 2019-11-07 NOTE — Anesthesia Procedure Notes (Signed)
Procedure Name: Intubation Date/Time: 11/07/2019 7:33 AM Performed by: Justus Memory, CRNA Pre-anesthesia Checklist: Patient identified, Patient being monitored, Timeout performed, Emergency Drugs available and Suction available Patient Re-evaluated:Patient Re-evaluated prior to induction Oxygen Delivery Method: Circle system utilized Preoxygenation: Pre-oxygenation with 100% oxygen Induction Type: IV induction Ventilation: Mask ventilation without difficulty Laryngoscope Size: 3 and McGraph Grade View: Grade I Tube type: Oral Tube size: 7.0 mm Number of attempts: 1 Airway Equipment and Method: Stylet and Video-laryngoscopy Placement Confirmation: ETT inserted through vocal cords under direct vision,  positive ETCO2 and breath sounds checked- equal and bilateral Secured at: 21 cm Tube secured with: Tape Dental Injury: Teeth and Oropharynx as per pre-operative assessment

## 2019-11-07 NOTE — Interval H&P Note (Signed)
History and Physical Interval Note:  11/07/2019 7:14 AM  Johnny [name].  has presented today for surgery, with the diagnosis of Umbilical Hernia.  The various methods of treatment have been discussed with the patient and family. After consideration of risks, benefits and other options for treatment, the patient has consented to  Procedure(s): HERNIA REPAIR UMBILICAL ADULT (N/A) as a surgical intervention.  The patient's history has been reviewed, patient examined, no change in status, stable for surgery.  I have reviewed the patient's chart and labs.  Questions were answered to the patient's satisfaction.     [name]

## 2019-11-07 NOTE — Op Note (Addendum)
Operative Note  Pre-operative Diagnosis: umbilical hernia, incarcerated  Post-operative Diagnosis: same, 2 separate defects  Operation: umbilical hernia x 2 repair without mesh  Surgeon: [name], MD  Anesthesia: GETA  Assistant:none  Findings: There were 2 separate small fascial defects, separated by about 2 cm from each other.  I elected to repair each of these primarily, rather than enlarge the defect and place mesh.  Estimated Blood Loss: Less than 2 cc         Drains: None         Specimens: None       Complications: None immediately apparent         Condition: stable  Procedure Details  The patient was identified in the preoperative holding area. The benefits, complications, treatment options, and expected outcomes were discussed with the patient. The risks of bleeding, infection, recurrence of symptoms, failure to resolve symptoms, bowel injury, any of which could require further surgery were reviewed with the patient. The patient agreed to accept these risks. The patient was then taken to the operating room, identified as Johnny [name]. and the procedure verified.  A time out was performed and the above information confirmed.  Prior to the induction of general anesthesia, antibiotic prophylaxis was administered. VTE prophylaxis was in place. General endotracheal anesthesia was then administered and tolerated well. After induction, the abdomen was prepped with Chloraprep and draped in standard sterile fashion. The patient was positioned in the supine position.  The skin overlying the defect was infiltrated with a one-to-one mixture of 0.25% bupivacaine and 1% lidocaine with epinephrine. The umbilical incision was created over the hernia sac and electrocautery was used to dissect through subcutaneous tissue. The hernia was dissected free from adjacent tissue and fascia.  There were actually 2 separate defects present.  For each 1, the hernia sac was entered and the  sac was excised. Care was taken to avoid any injury to the bowel.  Each defect was approximately 1 to 1.5 cm.  They were separated by a distance of about 2 cm.  I elected to repair each 1 primarily.  The fascia was reapproximated over each defect using interrupted 0 Ethibond suture.  Liposomal bupivacaine was infiltrated along the fascial planes.  The cutaneous tissue was closed with 3-0 Vicryl and skin was closed with a  4-0 Monocryl in a subcuticular fashion. Dermabond was applied to the skin, then covered with Steri-Strips. Patient tolerated procedure well and there were no immediate complications identified. Needle, instrument, and sponge counts were reported to be correct by the nursing staff.  [name], MD FACS

## 2019-11-07 NOTE — Anesthesia Post-op Follow-up Note (Signed)
Anesthesia QCDR form completed.        

## 2019-11-07 NOTE — Anesthesia Preprocedure Evaluation (Signed)
Anesthesia Evaluation  Patient identified by MRN, date of birth, ID band Patient awake    Reviewed: Allergy & Precautions, H&P , NPO status , Patient's Chart, lab work & pertinent test results, reviewed documented beta blocker date and time   Airway Mallampati: II  TM Distance: >3 FB Neck ROM: full    Dental  (+) Upper Dentures, Lower Dentures   Pulmonary Current Smoker,    Pulmonary exam normal breath sounds clear to auscultation       Cardiovascular Exercise Tolerance: Good negative cardio ROS   Rhythm:regular Rate:Normal     Neuro/Psych  Neuromuscular disease negative psych ROS   GI/Hepatic Neg liver ROS, PUD, GERD  ,  Endo/Other  negative endocrine ROS  Renal/GU negative Renal ROS  negative genitourinary   Musculoskeletal  (+) Arthritis ,   Abdominal   Peds  Hematology negative hematology ROS (+)   Anesthesia Other Findings Had sips of sprite at 0745  Reproductive/Obstetrics negative OB ROS                             Anesthesia Physical  Anesthesia Plan  ASA: II  Anesthesia Plan: General   Post-op Pain Management:    Induction: Intravenous  PONV Risk Score and Plan:   Airway Management Planned: Oral ETT  Additional Equipment:   Intra-op Plan:   Post-operative Plan:   Informed Consent: I have reviewed the patients History and Physical, chart, labs and discussed the procedure including the risks, benefits and alternatives for the proposed anesthesia with the patient or authorized representative who has indicated his/her understanding and acceptance.     Dental Advisory Given  Plan Discussed with: CRNA  Anesthesia Plan Comments:         Anesthesia Quick Evaluation

## 2019-11-07 NOTE — Discharge Instructions (Signed)
Open Hernia Repair, Adult, Care After This sheet gives you information about how to care for yourself after your procedure. Your health care provider may also give you more specific instructions. If you have problems or questions, contact your health care provider. What can I expect after the procedure? After the procedure, it is common to have:  Mild discomfort.  Slight bruising.  Minor swelling.  Pain in the abdomen. Follow these instructions at home: Incision care   Follow instructions from your health care provider about how to take care of your incision area. Make sure you: ? Wash your hands with soap and water before you change your bandage (dressing). If soap and water are not available, use hand sanitizer. ? Change your dressing as told by your health care provider. ? Leave stitches (sutures), skin glue, or adhesive strips in place. These skin closures may need to stay in place for 2 weeks or longer. If adhesive strip edges start to loosen and curl up, you may trim the loose edges. Do not remove adhesive strips completely unless your health care provider tells you to do that.  Check your incision area every day for signs of infection. Check for: ? More redness, swelling, or pain. ? More fluid or blood. ? Warmth. ? Pus or a bad smell. Activity  Do not drive or use heavy machinery while taking prescription pain medicine. Do not drive until your health care provider approves.  Until your health care provider approves: ? Do not lift anything that is heavier than 10 lb (4.5 kg). ? Do not play contact sports.  Return to your normal activities as told by your health care provider. Ask your health care provider what activities are safe. General instructions  To prevent or treat constipation while you are taking prescription pain medicine, your health care provider may recommend that you: ? Drink enough fluid to keep your urine clear or pale yellow. ? Take over-the-counter  or prescription medicines. ? Eat foods that are high in fiber, such as fresh fruits and vegetables, whole grains, and beans. ? Limit foods that are high in fat and processed sugars, such as fried and sweet foods.  Take over-the-counter and prescription medicines only as told by your health care provider.  Do not take tub baths or go swimming until your health care provider approves.  Keep all follow-up visits as told by your health care provider. This is important. Contact a health care provider if:  You develop a rash.  You have more redness, swelling, or pain around your incision.  You have more fluid or blood coming from your incision.  Your incision feels warm to the touch.  You have pus or a bad smell coming from your incision.  You have a fever or chills.  You have blood in your stool (feces).  You have not had a bowel movement in 2-3 days.  Your pain is not controlled with medicine. Get help right away if:  You have chest pain or shortness of breath.  You feel light-headed or feel faint.  You have severe pain.  You vomit and your pain is worse. This information is not intended to replace advice given to you by your health care provider. Make sure you discuss any questions you have with your health care provider. Document Released: 06/30/2005 Document Revised: 11/23/2017 Document Reviewed: 05/24/2016 Elsevier Patient Education  2020 [employer], Adult  Open hernia repair is a surgical procedure to fix a hernia. A hernia  occurs when an internal organ or tissue pushes out through a weak spot in the abdominal wall muscles. Hernias commonly occur in the groin and around the navel. Most hernias tend to get worse over time. Often, surgery is done to prevent the hernia from becoming bigger, uncomfortable, or an emergency. Emergency surgery may be needed if abdominal contents get stuck in the opening (incarcerated hernia) or the blood supply gets cut off  (strangulated hernia). In an open repair, an incision is made in the abdomen to perform the surgery. Tell a health care provider about:  Any allergies you have.  All medicines you are taking, including vitamins, herbs, eye drops, creams, and over-the-counter medicines.  Any problems you or family members have had with anesthetic medicines.  Any blood or bone disorders you have.  Any surgeries you have had.  Any medical conditions you have, including any recent cold or flu symptoms.  Whether you are pregnant or may be pregnant. What are the risks? Generally, this is a safe procedure. However, problems may occur, including:  Long-lasting (chronic) pain.  Bleeding.  Infection.  Damage to the testicle. This can cause shrinking or swelling.  Damage to the bladder, blood vessels, intestine, or nerves near the hernia.  Trouble passing urine.  Allergic reactions to medicines.  Return of the hernia. What happens before the procedure? Staying hydrated Follow instructions from your health care provider about hydration, which may include:  Up to 2 hours before the procedure - you may continue to drink clear liquids, such as water, clear fruit juice, black coffee, and plain tea. Eating and drinking restrictions Follow instructions from your health care provider about eating and drinking, which may include:  8 hours before the procedure - stop eating heavy meals or foods such as meat, fried foods, or fatty foods.  6 hours before the procedure - stop eating light meals or foods, such as toast or cereal.  6 hours before the procedure - stop drinking milk or drinks that contain milk.  2 hours before the procedure - stop drinking clear liquids. Medicines  Ask your health care provider about: ? Changing or stopping your regular medicines. This is especially important if you are taking diabetes medicines or blood thinners. ? Taking medicines such as aspirin and ibuprofen. These  medicines can thin your blood. Do not take these medicines before your procedure if your health care provider instructs you not to.  You may be given antibiotic medicine to help prevent infection. General instructions  You may have blood tests or imaging studies.  Ask your health care provider how your surgical site will be marked or identified.  If you smoke, do not smoke for at least 2 weeks before your procedure or for as long as told by your health care provider.  Let your health care provider know if you develop a cold or any infection before your surgery.  Plan to have someone take you home from the hospital or clinic.  If you will be going home right after the procedure, plan to have someone with you for 24 hours. What happens during the procedure?  To reduce your risk of infection: ? Your health care team will wash or sanitize their hands. ? Your skin will be washed with soap. ? Hair may be removed from the surgical area.  An IV tube will be inserted into one of your veins.  You will be given one or more of the following: ? A medicine to help you relax (sedative). ?  A medicine to numb the area (local anesthetic). ? A medicine to make you fall asleep (general anesthetic).  Your surgeon will make an incision over the hernia.  The tissues of the hernia will be moved back into place.  The edges of the hernia may be stitched together.  The opening in the abdominal muscles will be closed with stitches (sutures). Or, your surgeon will place a mesh patch made of manmade (synthetic) material over the opening.  The incision will be closed.  A bandage (dressing) may be placed over the incision. The procedure may vary among health care providers and hospitals. What happens after the procedure?  Your blood pressure, heart rate, breathing rate, and blood oxygen level will be monitored until the medicines you were given have worn off.  You may be given medicine for pain.  Do  not drive for 24 hours if you received a sedative. This information is not intended to replace advice given to you by your health care provider. Make sure you discuss any questions you have with your health care provider. Document Released: 06/06/2001 Document Revised: 11/23/2017 Document Reviewed: 05/24/2016 Elsevier Patient Education  2020 [employer].

## 2019-11-07 NOTE — Transfer of Care (Signed)
Immediate Anesthesia Transfer of Care Note  Patient: Arlo Hoar.  Procedure(s) Performed: HERNIA REPAIR UMBILICAL ADULT (N/A )   Patient Location: PACU  Anesthesia Type:General  Level of Consciousness: sedated  Airway & Oxygen Therapy: Patient Spontanous Breathing and Patient connected to face mask oxygen  Post-op Assessment: Report given to RN and Post -op Vital signs reviewed and stable  Post vital signs: Reviewed and stable  Last Vitals:  Vitals Value Taken Time  BP 88/57 11/07/19 0838  Temp    Pulse 73 11/07/19 0840  Resp 14 11/07/19 0840  SpO2 95 % 11/07/19 0840  Vitals shown include unvalidated device data.  Last Pain:  Vitals:   11/07/19 0622  TempSrc: Oral  PainSc: 10-Worst pain ever         Complications: No apparent anesthesia complications

## 2019-11-07 NOTE — Telephone Encounter (Signed)
Medication Refill - Medication: oxyCODONE (OXY IR/ROXICODONE) 5 MG immediate release tablet  Has the patient contacted their pharmacy? No - states he had hernia surgery today and the surgeon only gave him a few.  He would like for PCP to give him more, states he will run out before next appointment (Agent: If no, request that the patient contact the pharmacy for the refill.) (Agent: If yes, when and what did the pharmacy advise?)  Preferred Pharmacy (with phone number or street name):  [id number] - [place], [place] [address] [phone] (Phone) [phone] (Fax)   Agent: Please be advised that RX refills may take up to 3 business days. We ask that you follow-up with your pharmacy.

## 2019-11-07 NOTE — Telephone Encounter (Signed)
Requested medication (s) are due for refill today: yes  Requested medication (s) are on the active medication list: yes  Last refill:  11/07/2019  Future visit scheduled: yes  Notes to clinic:  No - states he had hernia surgery today and the surgeon only gave him a few.  He would like for PCP to give him more, states he will run out before next appointment   Requested Prescriptions  Pending Prescriptions Disp Refills   oxyCODONE (OXY IR/ROXICODONE) 5 MG immediate release tablet 15 tablet 0    Sig: Take 1 tablet (5 mg total) by mouth every 6 (six) hours as needed for severe pain.     Not Delegated - Analgesics:  Opioid Agonists Failed - 11/07/2019 11:14 AM      Failed - This refill cannot be delegated      Failed - Urine Drug Screen completed in last 360 days.      Passed - Valid encounter within last 6 months    Recent Outpatient Visits          1 week ago [place], Megan P, DO   4 weeks ago Osteoarthritis of spine with radiculopathy, lumbar region   [facility], [place], DO   1 month ago Right sided sciatica   [place], [place], DO   2 months ago Right sided sciatica   [place], [place], DO   2 months ago Lumbar radiculopathy   [facility] Valerie Roys, DO      Future Appointments            In 4 days Wynetta Emery, Barb Merino, DO [place], [place]   In 2 weeks [name], MD [facility]

## 2019-11-10 ENCOUNTER — Telehealth: Payer: Self-pay

## 2019-11-10 NOTE — Telephone Encounter (Signed)
Patient called in wanting [place] to send his MRI report to neurosurgery, patient informed that we can not release someone else's records and that he will need to contact [facility] to get them to send it to neurosurgery.  Copied from [place] [phone]. Topic: Referral - Question >> Nov 10, 2019 11:40 AM Sheran Luz wrote: Patient requesting call back from office. Patient states copy of MRI was not sent to neurosurgery. Patient is very frustrated.

## 2019-11-10 NOTE — Anesthesia Postprocedure Evaluation (Signed)
Anesthesia Post Note  Patient: Johnny [name].  Procedure(s) Performed: HERNIA REPAIR UMBILICAL ADULT (N/A )  Patient location during evaluation: PACU Anesthesia Type: General Level of consciousness: awake and alert and oriented Pain management: pain level controlled Vital Signs Assessment: post-procedure vital signs reviewed and stable Respiratory status: spontaneous breathing Cardiovascular status: blood pressure returned to baseline Anesthetic complications: no     Last Vitals:  Vitals:   11/07/19 0928 11/07/19 0946  BP: (!) 119/94 (!) 139/99  Pulse: 76 78  Resp: 16 16  Temp: (!) 36.2 C   SpO2: 99% 98%    Last Pain:  Vitals:   11/10/19 0840  TempSrc:   PainSc: 0-No pain                 Jamielyn Petrucci,[place]

## 2019-11-11 ENCOUNTER — Encounter: Payer: Self-pay | Admitting: Family Medicine

## 2019-11-11 ENCOUNTER — Telehealth: Payer: Self-pay | Admitting: Family Medicine

## 2019-11-11 ENCOUNTER — Ambulatory Visit (INDEPENDENT_AMBULATORY_CARE_PROVIDER_SITE_OTHER): Payer: Medicaid Other | Admitting: Family Medicine

## 2019-11-11 ENCOUNTER — Other Ambulatory Visit: Payer: Self-pay

## 2019-11-11 DIAGNOSIS — G894 Chronic pain syndrome: Secondary | ICD-10-CM

## 2019-11-11 MED ORDER — OXYCODONE HCL 5 MG PO TABS: 5.0000 mg | ORAL_TABLET | Freq: Three times a day (TID) | ORAL | 0 refills | Status: DC | PRN

## 2019-11-11 MED ORDER — NORTRIPTYLINE HCL 25 MG PO CAPS: 25.0000 mg | ORAL_CAPSULE | Freq: Every day | ORAL | 3 refills | Status: DC

## 2019-11-11 NOTE — Telephone Encounter (Signed)
PA submitted via [place] tracks, patient notified, he states that he paid cash for it.

## 2019-11-11 NOTE — Assessment & Plan Note (Signed)
Will continue gabapentin and start nortriptyline. Keep appointments with neurosurgery- await their input. Keep appointment with pain management in [place]. Refill of oxycodone given today. PMP reviewed- we are aware of Rx from Dr. [name] for post-op pain, did not overlap with pain medicine from this office. Follow up within 4 weeks.

## 2019-11-11 NOTE — Telephone Encounter (Signed)
Pt states that his pharmacy told him that oxyCODONE (OXY IR/ROXICODONE) 5 MG immediate release tablet was going to require pre authorization.  Pt states this happens every time and wants to know how long this will take so he can decide if he is going to wait or just pay for medication out of pocket.

## 2019-11-11 NOTE — Progress Notes (Signed)
There were no vitals taken for this visit.   Subjective:    Patient ID: Johnny Jewett., male    DOB: Sep 07, 1971, 48 y.o.   MRN: [id number]  HPI: Johnny Clarida. is a 48 y.o. male  Chief Complaint  Patient presents with  . Pain   CHRONIC PAIN- Johnny [name] notes that he is not doing well. He notes that he is in a lot of pain. He notes that his pain is primarily in his back. He saw neurosurgeon yesterday. He got the disk with his images yesterday and is dropping it off to the neurosurgeon today. He has not gotten hooked in with them for pain management yet.  He had surgery for an umbilical hernia on Friday and has been in pain since then as well.  Present dose:  22.5 Morphine equivalents Pain control status: exacerbated Duration: chronic Location: buttock and leg Quality: "corkscrew", stabbing, severe Current Pain Level: severe Previous Pain Level: severe Breakthrough pain: yes Benefit from narcotic medications: yes What Activities task can be accomplished with current medication? Able to sleep a little and do his ADLs Interested in weaning off narcotics:no   Stool softners/OTC fiber: no  Previous pain specialty evaluation: yes Non-narcotic analgesic meds: yes Narcotic contract: no  Relevant past medical, surgical, family and social history reviewed and updated as indicated. Interim medical history since our last visit reviewed. Allergies and medications reviewed and updated.  Review of Systems  Constitutional: Negative.   Respiratory: Negative.   Cardiovascular: Negative.   Gastrointestinal: Positive for abdominal pain. Negative for abdominal distention, anal bleeding, blood in stool, constipation, diarrhea, nausea, rectal pain and vomiting.  Musculoskeletal: Positive for arthralgias, back pain, gait problem and myalgias. Negative for joint swelling, neck pain and neck stiffness.  Skin: Negative.   Neurological: Positive for weakness and numbness. Negative for  dizziness, tremors, seizures, syncope, facial asymmetry, speech difficulty, light-headedness and headaches.  Psychiatric/Behavioral: Negative.     Per HPI unless specifically indicated above     Objective:    There were no vitals taken for this visit.  Wt Readings from Last 3 Encounters:  11/07/19 190 lb (86.2 kg)  11/01/19 190 lb (86.2 kg)  10/31/19 192 lb (87.1 kg)    Physical Exam Vitals signs and nursing note reviewed.  Constitutional:      General: He is not in acute distress.    Appearance: Normal appearance. He is not ill-appearing, toxic-appearing or diaphoretic.  HENT:     Head: Normocephalic and atraumatic.     Right Ear: External ear normal.     Left Ear: External ear normal.     Nose: Nose normal.     Mouth/Throat:     Mouth: Mucous membranes are moist.     Pharynx: Oropharynx is clear.  Eyes:     General: No scleral icterus.       Right eye: No discharge.        Left eye: No discharge.     Conjunctiva/sclera: Conjunctivae normal.     Pupils: Pupils are equal, round, and reactive to light.  Neck:     Musculoskeletal: Normal range of motion.  Pulmonary:     Effort: Pulmonary effort is normal. No respiratory distress.     Comments: Speaking in full sentences Musculoskeletal: Normal range of motion.  Skin:    Coloration: Skin is not jaundiced or pale.     Findings: No bruising, erythema, lesion or rash.  Neurological:     Mental Status: He is  alert and oriented to person, place, and time. Mental status is at baseline.  Psychiatric:        Mood and Affect: Mood normal. Affect is angry.        Behavior: Behavior normal.        Thought Content: Thought content normal.        Judgment: Judgment normal.     Results for orders placed or performed during the hospital encounter of 11/04/19  SARS CORONAVIRUS 2 (TAT 6-24 HRS) Nasopharyngeal Nasopharyngeal Swab   Specimen: Nasopharyngeal Swab  Result Value Ref Range   SARS Coronavirus 2 NEGATIVE NEGATIVE       Assessment & Plan:   Problem List Items Addressed This Visit      Other   Chronic pain syndrome    Will continue gabapentin and start nortriptyline. Keep appointments with neurosurgery- await their input. Keep appointment with pain management in [place]. Refill of oxycodone given today. PMP reviewed- we are aware of Rx from Dr. [name] for post-op pain, did not overlap with pain medicine from this office. Follow up within 4 weeks.        Relevant Medications   nortriptyline (PAMELOR) 25 MG capsule   oxyCODONE (OXY IR/ROXICODONE) 5 MG immediate release tablet       Follow up plan: Return for BEFORE 12/15 for follow up pain.   . This visit was completed via [employer] due to the restrictions of the COVID-19 pandemic. All issues as above were discussed and addressed. Physical exam was done as above through visual confirmation on [employer]. If it was felt that the patient should be evaluated in the office, they were directed there. The patient verbally consented to this visit. . Location of the patient: home . Location of the provider: work . Those involved with this call:  . Provider: Park Liter, DO . CMA: Tiffany Reel, CMA . Front Desk/Registration: Don Perking  . Time spent on call: 15 minutes with patient face to face via video conference. More than 50% of this time was spent in counseling and coordination of care. 23 minutes total spent in review of patient's record and preparation of their chart.

## 2019-11-14 ENCOUNTER — Ambulatory Visit: Payer: Self-pay | Admitting: Licensed Clinical Social Worker

## 2019-11-14 NOTE — Chronic Care Management (AMB) (Signed)
  Care Management   Follow Up Note   11/14/2019 Name: Johnny [name]. MRN: [id number] DOB: August 15, 1971  Referred by: Johnny Roys, DO Reason for referral : Care Coordination   Johnny [name]. is a 48 y.o. year old male who is a primary care patient of Johnny Roys, DO. The care management team was consulted for assistance with care management and care coordination needs.    Review of patient status, including review of consultants reports, relevant laboratory and other test results, and collaboration with appropriate care team members and the patient's provider was performed as part of comprehensive patient evaluation and provision of chronic care management services.    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 See Care Plan for related entries.   Advanced Directives: See Care Plan and [employer] application for related entries.   Goals Addressed    . "I need safe and stable housing" (pt-stated)       Current Barriers:  . Financial constraints . Limited social support . Limited access to food . Housing barriers . Limited education about housing resources available during COVID-19*  Clinical Social Work Clinical Goal(s):  Marland Kitchen Over the next 90 days, client will work with SW to address concerns related to lack of stable housing. . Over the next 90 days, client will follow up with crisis support resources * as directed by SW  Interventions: . Patient interviewed and appropriate assessments performed . LCSW sent text message to patient with a list of financial support resources and low incoming housing resources and homeless shelters for him to refer back to whenever needed . Provided patient with information about housing resources available during [place]-  . Patient reports that he wishes to relocate out of the boarding house he's in. He reports that this boarding house is actually very nice and updated but reports that  there are two other male roommates living there with him and one causing constant conflict. He shares that one roommate often has arguments with his girlfriend which causes a disturbance throughout the nights and early mornings.  Marland Kitchen LCSW will completed C3 Guide and will update CCM team and PCP. Marland Kitchen Advised patient to contact CCM team directly if any urgent needs present . Assisted patient/caregiver with obtaining information about health plan benefits . Patient reports ongoing chronic pain . Patient was encouraged to go to [employer] today to inquire where he is at on the wait list for [employer].   Patient Self Care Activities:  . Attends all scheduled provider appointments . Calls provider office for new concerns or questions  Please see past updates related to this goal by clicking on the "Past Updates" button in the selected goal      The care management team will reach out to the patient again over the next 30 days.   Johnny [name], BSW, [name], [facility] Care Management [name] Phone: [phone]

## 2019-11-17 ENCOUNTER — Other Ambulatory Visit (HOSPITAL_COMMUNITY)
Admission: RE | Admit: 2019-11-17 | Discharge: 2019-11-17 | Disposition: A | Discharge status: Home / Self Care | Payer: Medicaid Other | Source: Ambulatory Visit | Attending: Neurosurgery | Admitting: Neurosurgery

## 2019-11-17 ENCOUNTER — Other Ambulatory Visit: Payer: Self-pay | Admitting: Neurosurgery

## 2019-11-17 ENCOUNTER — Other Ambulatory Visit: Payer: Self-pay

## 2019-11-17 DIAGNOSIS — Z01812 Encounter for preprocedural laboratory examination: Secondary | ICD-10-CM | POA: Diagnosis not present

## 2019-11-17 DIAGNOSIS — Z20828 Contact with and (suspected) exposure to other viral communicable diseases: Secondary | ICD-10-CM | POA: Insufficient documentation

## 2019-11-17 LAB — SARS CORONAVIRUS 2 (TAT 6-24 HRS): SARS Coronavirus 2: NEGATIVE

## 2019-11-17 NOTE — Progress Notes (Signed)
Spoke with pt for pre-op call. Pt denies cardiac history, HTN or Diabetes. Pt had hernia surgery on 11/07/19.   Pt just had his Covid test done, he states he understands that he has to be in quarantine and is doing so now.

## 2019-11-18 ENCOUNTER — Encounter (HOSPITAL_COMMUNITY)
Admission: RE | Disposition: A | Discharge status: Home / Self Care | Payer: Self-pay | Source: Home / Self Care | Attending: Neurosurgery

## 2019-11-18 ENCOUNTER — Ambulatory Visit (HOSPITAL_COMMUNITY): Payer: Medicaid Other | Admitting: Certified Registered Nurse Anesthetist

## 2019-11-18 ENCOUNTER — Encounter (HOSPITAL_COMMUNITY): Payer: Self-pay

## 2019-11-18 ENCOUNTER — Ambulatory Visit (HOSPITAL_COMMUNITY)
Admission: RE | Admit: 2019-11-18 | Discharge: 2019-11-18 | Disposition: A | Discharge status: Home / Self Care | Payer: Medicaid Other | Attending: Neurosurgery | Admitting: Neurosurgery

## 2019-11-18 ENCOUNTER — Ambulatory Visit (HOSPITAL_COMMUNITY): Payer: Medicaid Other

## 2019-11-18 DIAGNOSIS — Z981 Arthrodesis status: Secondary | ICD-10-CM | POA: Insufficient documentation

## 2019-11-18 DIAGNOSIS — Z880 Allergy status to penicillin: Secondary | ICD-10-CM | POA: Insufficient documentation

## 2019-11-18 DIAGNOSIS — Z881 Allergy status to other antibiotic agents status: Secondary | ICD-10-CM | POA: Insufficient documentation

## 2019-11-18 DIAGNOSIS — M47816 Spondylosis without myelopathy or radiculopathy, lumbar region: Secondary | ICD-10-CM | POA: Insufficient documentation

## 2019-11-18 DIAGNOSIS — Z87892 Personal history of anaphylaxis: Secondary | ICD-10-CM | POA: Insufficient documentation

## 2019-11-18 DIAGNOSIS — Z888 Allergy status to other drugs, medicaments and biological substances status: Secondary | ICD-10-CM | POA: Insufficient documentation

## 2019-11-18 DIAGNOSIS — F17210 Nicotine dependence, cigarettes, uncomplicated: Secondary | ICD-10-CM | POA: Insufficient documentation

## 2019-11-18 DIAGNOSIS — M5126 Other intervertebral disc displacement, lumbar region: Secondary | ICD-10-CM | POA: Diagnosis not present

## 2019-11-18 DIAGNOSIS — Z419 Encounter for procedure for purposes other than remedying health state, unspecified: Secondary | ICD-10-CM

## 2019-11-18 DIAGNOSIS — Z96651 Presence of right artificial knee joint: Secondary | ICD-10-CM | POA: Insufficient documentation

## 2019-11-18 DIAGNOSIS — M170 Bilateral primary osteoarthritis of knee: Secondary | ICD-10-CM | POA: Diagnosis not present

## 2019-11-18 DIAGNOSIS — G894 Chronic pain syndrome: Secondary | ICD-10-CM | POA: Insufficient documentation

## 2019-11-18 DIAGNOSIS — Z885 Allergy status to narcotic agent status: Secondary | ICD-10-CM | POA: Diagnosis not present

## 2019-11-18 DIAGNOSIS — Z91013 Allergy to seafood: Secondary | ICD-10-CM | POA: Diagnosis not present

## 2019-11-18 HISTORY — PX: LUMBAR LAMINECTOMY/DECOMPRESSION MICRODISCECTOMY: SHX5026

## 2019-11-18 LAB — CBC
HCT: 42.6 % (ref 39.0–52.0)
Hemoglobin: 14.9 g/dL (ref 13.0–17.0)
MCH: 32.3 pg (ref 26.0–34.0)
MCHC: 35 g/dL (ref 30.0–36.0)
MCV: 92.4 fL (ref 80.0–100.0)
Platelets: 226 10*3/uL (ref 150–400)
RBC: 4.61 MIL/uL (ref 4.22–5.81)
RDW: 12.5 % (ref 11.5–15.5)
WBC: 5.9 10*3/uL (ref 4.0–10.5)
nRBC: 0 % (ref 0.0–0.2)

## 2019-11-18 SURGERY — LUMBAR LAMINECTOMY/DECOMPRESSION MICRODISCECTOMY 1 LEVEL
Anesthesia: General

## 2019-11-18 MED ORDER — HEMOSTATIC AGENTS (NO CHARGE) OPTIME
TOPICAL | Status: DC | PRN
  Administered 2019-11-18: 1 via TOPICAL

## 2019-11-18 MED ORDER — MIDAZOLAM HCL 2 MG/2ML IJ SOLN
INTRAMUSCULAR | Status: DC | PRN
  Administered 2019-11-18: 2 mg via INTRAVENOUS

## 2019-11-18 MED ORDER — LACTATED RINGERS IV SOLN
INTRAVENOUS | Status: DC | PRN
  Administered 2019-11-18 (×2): via INTRAVENOUS

## 2019-11-18 MED ORDER — 0.9 % SODIUM CHLORIDE (POUR BTL) OPTIME
TOPICAL | Status: DC | PRN
  Administered 2019-11-18: 1000 mL

## 2019-11-18 MED ORDER — PROPOFOL 10 MG/ML IV BOLUS
INTRAVENOUS | Status: AC
  Filled 2019-11-18: qty 40

## 2019-11-18 MED ORDER — ONDANSETRON HCL 4 MG PO TABS: 4.0000 mg | ORAL_TABLET | Freq: Four times a day (QID) | ORAL | Status: DC | PRN

## 2019-11-18 MED ORDER — ACETAMINOPHEN 160 MG/5ML PO SOLN: 1000.0000 mg | Freq: Once | ORAL | Status: DC | PRN

## 2019-11-18 MED ORDER — DEXAMETHASONE SODIUM PHOSPHATE 10 MG/ML IJ SOLN
INTRAMUSCULAR | Status: DC | PRN
  Administered 2019-11-18: 10 mg via INTRAVENOUS

## 2019-11-18 MED ORDER — MUPIROCIN 2 % EX OINT
TOPICAL_OINTMENT | CUTANEOUS | Status: AC
  Administered 2019-11-18: 1
  Filled 2019-11-18: qty 22

## 2019-11-18 MED ORDER — GABAPENTIN 400 MG PO CAPS
1400.0000 mg | ORAL_CAPSULE | Freq: Three times a day (TID) | ORAL | Status: DC
  Administered 2019-11-18: 15:00:00 1400 mg via ORAL
  Filled 2019-11-18: qty 2

## 2019-11-18 MED ORDER — SUGAMMADEX SODIUM 200 MG/2ML IV SOLN
INTRAVENOUS | Status: DC | PRN
  Administered 2019-11-18: 173.2 mg via INTRAVENOUS

## 2019-11-18 MED ORDER — VANCOMYCIN HCL IN DEXTROSE 1-5 GM/200ML-% IV SOLN
1000.0000 mg | INTRAVENOUS | Status: AC
  Administered 2019-11-18 (×2): 1000 mg via INTRAVENOUS

## 2019-11-18 MED ORDER — MIDAZOLAM HCL 2 MG/2ML IJ SOLN
INTRAMUSCULAR | Status: AC
  Filled 2019-11-18: qty 2

## 2019-11-18 MED ORDER — DIAZEPAM 5 MG PO TABS
ORAL_TABLET | ORAL | Status: AC
  Filled 2019-11-18: qty 1

## 2019-11-18 MED ORDER — DEXAMETHASONE SODIUM PHOSPHATE 10 MG/ML IJ SOLN
INTRAMUSCULAR | Status: AC
  Filled 2019-11-18: qty 1

## 2019-11-18 MED ORDER — METHYLPREDNISOLONE ACETATE 80 MG/ML IJ SUSP
INTRAMUSCULAR | Status: DC | PRN
  Administered 2019-11-18: 80 mg

## 2019-11-18 MED ORDER — FENTANYL CITRATE (PF) 100 MCG/2ML IJ SOLN
INTRAMUSCULAR | Status: DC | PRN
  Administered 2019-11-18: 100 ug via INTRAVENOUS

## 2019-11-18 MED ORDER — LIDOCAINE-EPINEPHRINE 0.5 %-1:200000 IJ SOLN
INTRAMUSCULAR | Status: AC
  Filled 2019-11-18: qty 1

## 2019-11-18 MED ORDER — DIAZEPAM 5 MG PO TABS
5.0000 mg | ORAL_TABLET | Freq: Four times a day (QID) | ORAL | Status: DC | PRN
  Administered 2019-11-18 (×2): 5 mg via ORAL
  Filled 2019-11-18: qty 1

## 2019-11-18 MED ORDER — CHLORHEXIDINE GLUCONATE CLOTH 2 % EX PADS: 6.0000 | MEDICATED_PAD | Freq: Once | CUTANEOUS | Status: DC

## 2019-11-18 MED ORDER — PROPOFOL 10 MG/ML IV BOLUS
INTRAVENOUS | Status: DC | PRN
  Administered 2019-11-18: 190 mg via INTRAVENOUS

## 2019-11-18 MED ORDER — ONDANSETRON HCL 4 MG/2ML IJ SOLN
INTRAMUSCULAR | Status: AC
  Filled 2019-11-18: qty 2

## 2019-11-18 MED ORDER — DEXMEDETOMIDINE HCL 200 MCG/2ML IV SOLN
INTRAVENOUS | Status: DC | PRN
  Administered 2019-11-18 (×2): 12 ug via INTRAVENOUS

## 2019-11-18 MED ORDER — THROMBIN 5000 UNITS EX SOLR
CUTANEOUS | Status: DC | PRN
  Administered 2019-11-18 (×2): 5000 [IU] via TOPICAL

## 2019-11-18 MED ORDER — FENTANYL CITRATE (PF) 100 MCG/2ML IJ SOLN
INTRAMUSCULAR | Status: AC
  Filled 2019-11-18: qty 2

## 2019-11-18 MED ORDER — PHENYLEPHRINE 40 MCG/ML (10ML) SYRINGE FOR IV PUSH (FOR BLOOD PRESSURE SUPPORT)
PREFILLED_SYRINGE | INTRAVENOUS | Status: AC
  Filled 2019-11-18: qty 10

## 2019-11-18 MED ORDER — ONDANSETRON HCL 4 MG/2ML IJ SOLN
INTRAMUSCULAR | Status: DC | PRN
  Administered 2019-11-18: 4 mg via INTRAVENOUS

## 2019-11-18 MED ORDER — PHENYLEPHRINE 40 MCG/ML (10ML) SYRINGE FOR IV PUSH (FOR BLOOD PRESSURE SUPPORT)
PREFILLED_SYRINGE | INTRAVENOUS | Status: DC | PRN
  Administered 2019-11-18: 120 ug via INTRAVENOUS
  Administered 2019-11-18: 80 ug via INTRAVENOUS
  Administered 2019-11-18: 120 ug via INTRAVENOUS

## 2019-11-18 MED ORDER — METHYLPREDNISOLONE ACETATE 80 MG/ML IJ SUSP
INTRAMUSCULAR | Status: AC
  Filled 2019-11-18: qty 1

## 2019-11-18 MED ORDER — HYDROMORPHONE HCL 1 MG/ML IJ SOLN
INTRAMUSCULAR | Status: AC
  Filled 2019-11-18: qty 1

## 2019-11-18 MED ORDER — EPHEDRINE SULFATE-NACL 50-0.9 MG/10ML-% IV SOSY
PREFILLED_SYRINGE | INTRAVENOUS | Status: DC | PRN
  Administered 2019-11-18: 10 mg via INTRAVENOUS

## 2019-11-18 MED ORDER — ACETAMINOPHEN 10 MG/ML IV SOLN: 1000.0000 mg | Freq: Once | INTRAVENOUS | Status: DC | PRN

## 2019-11-18 MED ORDER — ROCURONIUM BROMIDE 10 MG/ML (PF) SYRINGE
PREFILLED_SYRINGE | INTRAVENOUS | Status: DC | PRN
  Administered 2019-11-18: 30 mg via INTRAVENOUS
  Administered 2019-11-18: 50 mg via INTRAVENOUS

## 2019-11-18 MED ORDER — SODIUM CHLORIDE 0.9 % IV SOLN: 250.0000 mL | INTRAVENOUS | Status: DC

## 2019-11-18 MED ORDER — TIZANIDINE HCL 4 MG PO TABS: 4.0000 mg | ORAL_TABLET | Freq: Four times a day (QID) | ORAL | 0 refills | Status: DC | PRN

## 2019-11-18 MED ORDER — KETAMINE HCL 10 MG/ML IJ SOLN
INTRAMUSCULAR | Status: DC | PRN
  Administered 2019-11-18: 20 mg via INTRAVENOUS
  Administered 2019-11-18: 30 mg via INTRAVENOUS

## 2019-11-18 MED ORDER — EPHEDRINE 5 MG/ML INJ
INTRAVENOUS | Status: AC
  Filled 2019-11-18: qty 10

## 2019-11-18 MED ORDER — OXYCODONE HCL 5 MG PO TABS: 5.0000 mg | ORAL_TABLET | ORAL | Status: DC | PRN

## 2019-11-18 MED ORDER — FENTANYL CITRATE (PF) 250 MCG/5ML IJ SOLN
INTRAMUSCULAR | Status: DC | PRN
  Administered 2019-11-18 (×3): 50 ug via INTRAVENOUS

## 2019-11-18 MED ORDER — OXYCODONE HCL ER 15 MG PO T12A
15.0000 mg | EXTENDED_RELEASE_TABLET | Freq: Two times a day (BID) | ORAL | Status: DC
  Administered 2019-11-18: 11:00:00 15 mg via ORAL
  Filled 2019-11-18: qty 1

## 2019-11-18 MED ORDER — HYDRALAZINE HCL 20 MG/ML IJ SOLN
INTRAMUSCULAR | Status: AC
  Filled 2019-11-18: qty 1

## 2019-11-18 MED ORDER — LIDOCAINE 2% (20 MG/ML) 5 ML SYRINGE
INTRAMUSCULAR | Status: DC | PRN
  Administered 2019-11-18: 80 mg via INTRAVENOUS

## 2019-11-18 MED ORDER — THROMBIN 5000 UNITS EX SOLR
CUTANEOUS | Status: AC
  Filled 2019-11-18: qty 10000

## 2019-11-18 MED ORDER — ACETAMINOPHEN 325 MG PO TABS: 650.0000 mg | ORAL_TABLET | ORAL | Status: DC | PRN

## 2019-11-18 MED ORDER — SODIUM CHLORIDE 0.9% FLUSH: 3.0000 mL | Freq: Two times a day (BID) | INTRAVENOUS | Status: DC

## 2019-11-18 MED ORDER — ONDANSETRON HCL 4 MG/2ML IJ SOLN: 4.0000 mg | Freq: Four times a day (QID) | INTRAMUSCULAR | Status: DC | PRN

## 2019-11-18 MED ORDER — OXYCODONE HCL 5 MG PO TABS
10.0000 mg | ORAL_TABLET | ORAL | Status: DC | PRN
  Administered 2019-11-18 (×2): 10 mg via ORAL
  Filled 2019-11-18 (×2): qty 2

## 2019-11-18 MED ORDER — HYDROMORPHONE HCL 1 MG/ML IJ SOLN: 0.2500 mg | INTRAMUSCULAR | Status: DC | PRN

## 2019-11-18 MED ORDER — ACETAMINOPHEN 650 MG RE SUPP: 650.0000 mg | RECTAL | Status: DC | PRN

## 2019-11-18 MED ORDER — MENTHOL 3 MG MT LOZG: 1.0000 | LOZENGE | OROMUCOSAL | Status: DC | PRN

## 2019-11-18 MED ORDER — LIDOCAINE 2% (20 MG/ML) 5 ML SYRINGE
INTRAMUSCULAR | Status: AC
  Filled 2019-11-18: qty 5

## 2019-11-18 MED ORDER — ZOLPIDEM TARTRATE 5 MG PO TABS: 5.0000 mg | ORAL_TABLET | Freq: Every evening | ORAL | Status: DC | PRN

## 2019-11-18 MED ORDER — HYDRALAZINE HCL 20 MG/ML IJ SOLN
10.0000 mg | Freq: Once | INTRAMUSCULAR | Status: AC
  Administered 2019-11-18: 10:00:00 10 mg via INTRAVENOUS

## 2019-11-18 MED ORDER — OXYCODONE HCL 5 MG PO TABS: 5.0000 mg | ORAL_TABLET | Freq: Four times a day (QID) | ORAL | 0 refills | Status: DC | PRN

## 2019-11-18 MED ORDER — DEXMEDETOMIDINE HCL IN NACL 80 MCG/20ML IV SOLN
INTRAVENOUS | Status: AC
  Filled 2019-11-18: qty 20

## 2019-11-18 MED ORDER — MORPHINE SULFATE (PF) 2 MG/ML IV SOLN: 2.0000 mg | INTRAVENOUS | Status: DC | PRN

## 2019-11-18 MED ORDER — OXYCODONE HCL 5 MG/5ML PO SOLN: 5.0000 mg | Freq: Once | ORAL | Status: DC | PRN

## 2019-11-18 MED ORDER — SODIUM CHLORIDE 0.9% FLUSH: 3.0000 mL | INTRAVENOUS | Status: DC | PRN

## 2019-11-18 MED ORDER — KETAMINE HCL 50 MG/5ML IJ SOSY
PREFILLED_SYRINGE | INTRAMUSCULAR | Status: AC
  Filled 2019-11-18: qty 5

## 2019-11-18 MED ORDER — PHENOL 1.4 % MT LIQD: 1.0000 | OROMUCOSAL | Status: DC | PRN

## 2019-11-18 MED ORDER — LIDOCAINE-EPINEPHRINE 0.5 %-1:200000 IJ SOLN
INTRAMUSCULAR | Status: DC | PRN
  Administered 2019-11-18: 10 mL

## 2019-11-18 MED ORDER — VANCOMYCIN HCL IN DEXTROSE 1-5 GM/200ML-% IV SOLN
INTRAVENOUS | Status: AC
  Administered 2019-11-18: 07:00:00 1000 mg via INTRAVENOUS
  Filled 2019-11-18: qty 200

## 2019-11-18 MED ORDER — FENTANYL CITRATE (PF) 250 MCG/5ML IJ SOLN
INTRAMUSCULAR | Status: AC
  Filled 2019-11-18: qty 5

## 2019-11-18 MED ORDER — GABAPENTIN 600 MG PO TABS: 600.0000 mg | ORAL_TABLET | Freq: Three times a day (TID) | ORAL | Status: DC

## 2019-11-18 MED ORDER — OXYCODONE HCL 5 MG PO TABS: 5.0000 mg | ORAL_TABLET | Freq: Once | ORAL | Status: DC | PRN

## 2019-11-18 MED ORDER — POTASSIUM CHLORIDE IN NACL 20-0.9 MEQ/L-% IV SOLN: INTRAVENOUS | Status: DC

## 2019-11-18 MED ORDER — ACETAMINOPHEN 500 MG PO TABS: 1000.0000 mg | ORAL_TABLET | Freq: Once | ORAL | Status: DC | PRN

## 2019-11-18 MED ORDER — HYDROMORPHONE HCL 1 MG/ML IJ SOLN
0.2500 mg | INTRAMUSCULAR | Status: DC | PRN
  Administered 2019-11-18 (×4): 0.5 mg via INTRAVENOUS

## 2019-11-18 MED ORDER — KETOROLAC TROMETHAMINE 15 MG/ML IJ SOLN
15.0000 mg | Freq: Four times a day (QID) | INTRAMUSCULAR | Status: DC
  Administered 2019-11-18 (×2): 15 mg via INTRAVENOUS
  Filled 2019-11-18 (×2): qty 1

## 2019-11-18 MED ORDER — ROCURONIUM BROMIDE 10 MG/ML (PF) SYRINGE
PREFILLED_SYRINGE | INTRAVENOUS | Status: AC
  Filled 2019-11-18: qty 10

## 2019-11-18 SURGICAL SUPPLY — 53 items
ADH SKN CLS APL DERMABOND .7 (GAUZE/BANDAGES/DRESSINGS) ×1
APL SKNCLS STERI-STRIP NONHPOA (GAUZE/BANDAGES/DRESSINGS)
BENZOIN TINCTURE PRP APPL 2/3 (GAUZE/BANDAGES/DRESSINGS) IMPLANT
BLADE CLIPPER SURG (BLADE) IMPLANT
BUR MATCHSTICK NEURO 3.0 LAGG (BURR) ×2 IMPLANT
BUR PRECISION FLUTE 5.0 (BURR) ×1 IMPLANT
CANISTER SUCT 3000ML PPV (MISCELLANEOUS) ×2 IMPLANT
CARTRIDGE OIL MAESTRO DRILL (MISCELLANEOUS) ×1 IMPLANT
COVER WAND RF STERILE (DRAPES) ×2 IMPLANT
DECANTER SPIKE VIAL GLASS SM (MISCELLANEOUS) ×2 IMPLANT
DERMABOND ADVANCED (GAUZE/BANDAGES/DRESSINGS) ×1
DERMABOND ADVANCED .7 DNX12 (GAUZE/BANDAGES/DRESSINGS) ×1 IMPLANT
DIFFUSER DRILL AIR PNEUMATIC (MISCELLANEOUS) ×2 IMPLANT
DRAPE LAPAROTOMY 100X72X124 (DRAPES) ×2 IMPLANT
DRAPE MICROSCOPE LEICA (MISCELLANEOUS) ×2 IMPLANT
DRAPE SURG 17X23 STRL (DRAPES) ×2 IMPLANT
DURAPREP 26ML APPLICATOR (WOUND CARE) ×2 IMPLANT
ELECT REM PT RETURN 9FT ADLT (ELECTROSURGICAL) ×2
ELECTRODE REM PT RTRN 9FT ADLT (ELECTROSURGICAL) ×1 IMPLANT
GAUZE 4X4 16PLY RFD (DISPOSABLE) IMPLANT
GAUZE SPONGE 4X4 12PLY STRL (GAUZE/BANDAGES/DRESSINGS) IMPLANT
GLOVE BIOGEL PI IND STRL 6.5 (GLOVE) IMPLANT
GLOVE BIOGEL PI INDICATOR 6.5 (GLOVE) ×1
GLOVE ECLIPSE 6.5 STRL STRAW (GLOVE) ×1 IMPLANT
GLOVE EXAM NITRILE XL STR (GLOVE) IMPLANT
GLOVE SURG SS PI 6.5 STRL IVOR (GLOVE) ×1 IMPLANT
GLOVE SURG SS PI 7.5 STRL IVOR (GLOVE) ×2 IMPLANT
GOWN STRL REUS W/ TWL LRG LVL3 (GOWN DISPOSABLE) ×2 IMPLANT
GOWN STRL REUS W/ TWL XL LVL3 (GOWN DISPOSABLE) IMPLANT
GOWN STRL REUS W/TWL 2XL LVL3 (GOWN DISPOSABLE) IMPLANT
GOWN STRL REUS W/TWL LRG LVL3 (GOWN DISPOSABLE) ×4
GOWN STRL REUS W/TWL XL LVL3 (GOWN DISPOSABLE)
KIT BASIN OR (CUSTOM PROCEDURE TRAY) ×2 IMPLANT
KIT TURNOVER KIT B (KITS) ×2 IMPLANT
NDL HYPO 25X1 1.5 SAFETY (NEEDLE) ×1 IMPLANT
NDL SPNL 18GX3.5 QUINCKE PK (NEEDLE) IMPLANT
NEEDLE HYPO 25X1 1.5 SAFETY (NEEDLE) ×2 IMPLANT
NEEDLE SPNL 18GX3.5 QUINCKE PK (NEEDLE) ×2 IMPLANT
NS IRRIG 1000ML POUR BTL (IV SOLUTION) ×2 IMPLANT
OIL CARTRIDGE MAESTRO DRILL (MISCELLANEOUS) ×2
PACK LAMINECTOMY NEURO (CUSTOM PROCEDURE TRAY) ×2 IMPLANT
PAD ARMBOARD 7.5X6 YLW CONV (MISCELLANEOUS) ×3 IMPLANT
RUBBERBAND STERILE (MISCELLANEOUS) ×4 IMPLANT
SPONGE LAP 4X18 RFD (DISPOSABLE) IMPLANT
SPONGE SURGIFOAM ABS GEL SZ50 (HEMOSTASIS) ×2 IMPLANT
STRIP CLOSURE SKIN 1/2X4 (GAUZE/BANDAGES/DRESSINGS) IMPLANT
SUT VIC AB 0 CT1 18XCR BRD8 (SUTURE) ×1 IMPLANT
SUT VIC AB 0 CT1 8-18 (SUTURE) ×2
SUT VIC AB 2-0 CT1 18 (SUTURE) ×2 IMPLANT
SUT VIC AB 3-0 SH 8-18 (SUTURE) ×3 IMPLANT
TOWEL GREEN STERILE (TOWEL DISPOSABLE) ×2 IMPLANT
TOWEL GREEN STERILE FF (TOWEL DISPOSABLE) ×2 IMPLANT
WATER STERILE IRR 1000ML POUR (IV SOLUTION) ×2 IMPLANT

## 2019-11-18 NOTE — Discharge Instructions (Signed)
Lumbar Discectomy °Care After °A discectomy involves removal of discmaterial (the cartilage-like structures located between the bones of the back). It is done to relieve pressure on nerve roots. It can be used as a treatment for a back problem. The time in surgery depends on the findings in surgery and what is necessary to correct the problems. °HOME CARE INSTRUCTIONS  °· Check the cut (incision) made by the surgeon twice a day for signs of infection. Some signs of infection may include:  °· A foul smelling, greenish or yellowish discharge from the wound.  °· Increased pain.  °· Increased redness over the incision (operative) site.  °· The skin edges may separate.  °· Flu-like symptoms (problems).  °· A temperature above 101.5° F (38.6° C).  °· Change your bandages in about 24 to 36 hours following surgery or as directed.  °· You may shower tomrrow.  Avoid bathtubs, swimming pools and hot tubs for three weeks or until your incision has healed completely. °· Follow your doctor's instructions as to safe activities, exercises, and physical therapy.  °· Weight reduction may be beneficial if you are overweight.  °· Daily exercise is helpful to prevent the return of problems. Walking is permitted. You may use a treadmill without an incline. Cut down on activities and exercise if you have discomfort. You may also go up and down stairs as much as you can tolerate.  °· DO NOT lift anything heavier than 10 to 15 lbs. Avoid bending or twisting at the waist. Always bend your knees when lifting.  °· Maintain strength and range of motion as instructed.  °· Do not drive for 10 days, or as directed by your doctors. You may be a passenger . Lying back in the passenger seat may be more comfortable for you. Always wear a seatbelt.  °· Limit your sitting in a regular chair to 20 to 30 minutes at a time. There are no limitations for sitting in a recliner. You should lie down or walk in between sitting periods.  °· Only take  over-the-counter or prescription medicines for pain, discomfort, or fever as directed by your caregiver.  °SEEK MEDICAL CARE IF:  °· There is increased bleeding (more than a small spot) from the wound.  °· You notice redness, swelling, or increasing pain in the wound.  °· Pus is coming from wound.  °· You develop an unexplained oral temperature above 102° F (38.9° C) develops.  °· You notice a foul smell coming from the wound or dressing.  °· You have increasing pain in your wound.  °SEEK IMMEDIATE MEDICAL CARE IF:  °· You develop a rash.  °· You have difficulty breathing.  °· You develop any allergic problems to medicines given.  °Document Released: 11/15/2004 Document Revised: 11/30/2011 Document Reviewed: 03/05/2008 °ExitCare® Patient Information °

## 2019-11-18 NOTE — Transfer of Care (Signed)
Immediate Anesthesia Transfer of Care Note  Patient: Johnny [name].  Procedure(s) Performed: LUMBAR FOUR-FIVE LUMBAR LAMINECTOMY/DECOMPRESSION MICRODISCECTOMY (N/A )  Patient Location: PACU  Anesthesia Type:General  Level of Consciousness: drowsy and patient cooperative  Airway & Oxygen Therapy: Patient Spontanous Breathing and Patient connected to face mask oxygen  Post-op Assessment: Report given to RN and Post -op Vital signs reviewed and stable  Post vital signs: Reviewed and stable  Last Vitals:  Vitals Value Taken Time  BP 143/89   Temp    Pulse 85 11/18/19 0904  Resp 18 11/18/19 0904  SpO2 100 % 11/18/19 0904  Vitals shown include unvalidated device data.  Last Pain:  Vitals:   11/18/19 0655  TempSrc:   PainSc: 8          Complications: No apparent anesthesia complications

## 2019-11-18 NOTE — Op Note (Signed)
11/18/2019  9:00 AM  PATIENT:  Johnny [name].  48 y.o. 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 Targett was taken to the operating room, intubated and placed under a general anesthetic without difficulty. He was positioned prone on a Wilson frame with all pressure points padded. His back was prepped and draped in a sterile manner. I opened the skin with a 10 blade and carried the dissection down to the thoracolumbar fascia. I used both sharp dissection and the monopolar cautery to expose the lamina of L4, and 5. I confirmed my location with an intraoperative xray.  I used the drill, Kerrison punches, and curettes to perform a semihemilaminectomy of L4 and L5. I used the punches to remove the ligamentum flavum to expose the thecal sac. I brought the microscope into the operative field and I started the decompression of the spinal canal, thecal sac and L5 root(s). I cauterized epidural veins overlying the disc space then divided them sharply. I opened the disc space with a 15 blade and proceeded with the discectomy. I used pituitary rongeurs,  and other instruments to remove disc material. I did not enter the disc space, the free fragment was covered by annulus. I was able to remove the fragment. After the discectomy was completed I inspected the L5 nerve root and felt it was well decompressed. I explored rostrally, laterally, medially, and caudally and was satisfied with the decompression. I  irrigated the wound, then closed in layers. I approximated the thoracolumbar fascia, subcutaneous, and subcuticular planes with vicryl sutures. I used dermabond for a sterile dressing.   PLAN OF CARE: Admit for overnight observation  PATIENT DISPOSITION:  PACU - hemodynamically stable.   Delay start of Pharmacological VTE agent (>24hrs) due to surgical blood loss or risk of bleeding:  yes

## 2019-11-18 NOTE — Progress Notes (Signed)
Discharged instructions/education/Rx/AVS given to patient and verbalized understanding. Pain is mild to moderate and controlled by pRN meds. Ambulating well in the hallway, MAE well. Voiding and emptying bladder well. No drainage, no swelling, no redness noted on incision sit, Dressing CDI. Awaiting for discharge.

## 2019-11-18 NOTE — Anesthesia Procedure Notes (Signed)
Procedure Name: Intubation Date/Time: 11/18/2019 7:54 AM Performed by: Kathryne Hitch, CRNA Pre-anesthesia Checklist: Patient identified, Emergency Drugs available, Suction available and Patient being monitored Patient Re-evaluated:Patient Re-evaluated prior to induction Oxygen Delivery Method: Circle system utilized Preoxygenation: Pre-oxygenation with 100% oxygen Induction Type: IV induction Ventilation: Mask ventilation without difficulty Laryngoscope Size: Miller and 3 Grade View: Grade I Tube type: Oral Tube size: 8.0 mm Number of attempts: 1 Airway Equipment and Method: Stylet and Oral airway Placement Confirmation: ETT inserted through vocal cords under direct vision,  positive ETCO2 and breath sounds checked- equal and bilateral Secured at: 25 cm Tube secured with: Tape Dental Injury: Teeth and Oropharynx as per pre-operative assessment

## 2019-11-18 NOTE — Anesthesia Preprocedure Evaluation (Signed)
Anesthesia Evaluation  Patient identified by MRN, date of birth, ID band Patient awake    Reviewed: Allergy & Precautions, NPO status , Patient's Chart, lab work & pertinent test results  History of Anesthesia Complications Negative for: history of anesthetic complications  Airway Mallampati: I  TM Distance: >3 FB Neck ROM: Full    Dental  (+) Edentulous Upper, Edentulous Lower   Pulmonary neg shortness of breath, neg COPD, neg recent URI, Current SmokerPatient did not abstain from smoking.,    breath sounds clear to auscultation       Cardiovascular negative cardio ROS   Rhythm:Regular     Neuro/Psych  Neuromuscular disease negative psych ROS   GI/Hepatic Neg liver ROS, PUD, GERD  ,  Endo/Other  negative endocrine ROS  Renal/GU negative Renal ROS     Musculoskeletal  (+) Arthritis ,   Abdominal   Peds  Hematology negative hematology ROS (+)   Anesthesia Other Findings   Reproductive/Obstetrics                             Anesthesia Physical Anesthesia Plan  ASA: II  Anesthesia Plan: General   Post-op Pain Management:    Induction: Intravenous  PONV Risk Score and Plan: 1 and Treatment may vary due to age or medical condition, Ondansetron and Dexamethasone  Airway Management Planned: Oral ETT  Additional Equipment: None  Intra-op Plan:   Post-operative Plan: Extubation in OR  Informed Consent: I have reviewed the patients History and Physical, chart, labs and discussed the procedure including the risks, benefits and alternatives for the proposed anesthesia with the patient or authorized representative who has indicated his/her understanding and acceptance.     Dental advisory given  Plan Discussed with: CRNA and Surgeon  Anesthesia Plan Comments:         Anesthesia Quick Evaluation

## 2019-11-18 NOTE — H&P (Signed)
BP (!) 133/103   Pulse 82   Temp 98.3 F (36.8 C) (Oral)   Resp 18   Ht 6' (1.829 m)   Wt 86.6 kg   SpO2 98%   BMI 25.90 kg/m  Johnny [name] presents with pain in the right lower extremity due to a hnp at L4/5 on the right side. Allergies  Allergen Reactions  . Crab [Shellfish Allergy] Itching and Swelling  . Flexeril [Cyclobenzaprine] Anaphylaxis  . Codeine Itching  . Latex Swelling    Gloves(when worn), condoms  . Penicillins Nausea And Vomiting    Did it involve swelling of the face/tongue/throat, SOB, or low BP? No Did it involve sudden or severe rash/hives, skin peeling, or any reaction on the inside of your mouth or nose? No Did you need to seek medical attention at a hospital or doctor's office? No When did it last happen?10+years If all above answers are "NO", may proceed with cephalosporin use.    . Robaxin [Methocarbamol] Itching  . Sulfamethoxazole-Trimethoprim Nausea Only and Other (See Comments)    Stomach pain   . Tramadol Other (See Comments)   Past Medical History:  Diagnosis Date  . Arthritis    hands, knees, lower back  . Bilateral sciatica   . Carpal tunnel syndrome   . Chronic back pain   . Chronic neck pain   . Chronic pain syndrome   . GERD (gastroesophageal reflux disease)   . Hand joint pain 07/02/2013  . Herniated disc   . Traumatic amputation of left index finger 2013  . Wears dentures    full upper and lower   Past Surgical History:  Procedure Laterality Date  . AMPUTATION FINGER / THUMB Left 2013  . COLONOSCOPY WITH PROPOFOL N/A 01/03/2018   Procedure: COLONOSCOPY WITH PROPOFOL;  Surgeon: [name], MD;  Location: [facility];  Service: Endoscopy;  Laterality: N/A;  . ESOPHAGOGASTRODUODENOSCOPY (EGD) WITH PROPOFOL N/A 01/03/2018   Procedure: ESOPHAGOGASTRODUODENOSCOPY (EGD) WITH PROPOFOL;  Surgeon: [name], MD;  Location: [facility];  Service: Endoscopy;  Laterality: N/A;  . HERNIA REPAIR Right    right  inguinal hernia repaired twice  . HIP SURGERY    . JOINT REPLACEMENT Right    knee  . KNEE SURGERY Right   . neck fusion    . POLYPECTOMY N/A 01/03/2018   Procedure: POLYPECTOMY;  Surgeon: [name], MD;  Location: [facility];  Service: Endoscopy;  Laterality: N/A;  . REPLACEMENT TOTAL KNEE Right   . SHOULDER SURGERY Right 2016 X 2  . SPINAL FUSION    . TONSILLECTOMY    . UMBILICAL HERNIA REPAIR N/A 11/07/2019   Procedure: HERNIA REPAIR UMBILICAL ADULT;  Surgeon: [name], MD;  Location: [facility];  Service: General;  Laterality: N/A;   Family History  Problem Relation Age of Onset  . Cancer Father        sarcoma  . Cirrhosis Paternal Grandmother   . Cancer Paternal Grandfather        Pancreatic  . Fibromyalgia Sister   . Deafness Sister    Social History   Socioeconomic History  . Marital status: Divorced    Spouse name: Not on file  . Number of children: Not on file  . Years of education: Not on file  . Highest education level: Not on file  Occupational History  . Not on file  Social Needs  . Financial resource strain: Not on file  . Food insecurity    Worry: Not on file  Inability: Not on file  . Transportation needs    Medical: Not on file    Non-medical: Not on file  Tobacco Use  . Smoking status: Current Every Day Smoker    Packs/day: 0.50    Years: 25.00    Pack years: 12.50    Types: Cigarettes  . Smokeless tobacco: Never Used  Substance and Sexual Activity  . Alcohol use: No  . Drug use: No  . Sexual activity: Yes  Lifestyle  . Physical activity    Days per week: Not on file    Minutes per session: Not on file  . Stress: Not on file  Relationships  . Social Herbalist on phone: Not on file    Gets together: Not on file    Attends religious service: Not on file    Active member of club or organization: Not on file    Attends meetings of clubs or organizations: Not on file    Relationship status: Not on file  .  Intimate partner violence    Fear of current or ex partner: Not on file    Emotionally abused: Not on file    Physically abused: Not on file    Forced sexual activity: Not on file  Other Topics Concern  . Not on file  Social History Narrative  . Not on file   Results for orders placed or performed during the hospital encounter of 11/18/19 (from the past 48 hour(s))  CBC     Status: None   Collection Time: 11/18/19  6:37 AM  Result Value Ref Range   WBC 5.9 4.0 - 10.5 K/uL   RBC 4.61 4.22 - 5.81 MIL/uL   Hemoglobin 14.9 13.0 - 17.0 g/dL   HCT 42.6 39.0 - 52.0 %   MCV 92.4 80.0 - 100.0 fL   MCH 32.3 26.0 - 34.0 pg   MCHC 35.0 30.0 - 36.0 g/dL   RDW 12.5 11.5 - 15.5 %   Platelets 226 150 - 400 K/uL   nRBC 0.0 0.0 - 0.2 %    Comment: Performed at [facility], [place] [address] [place], [place] [address]  Physical Exam Constitutional:      Appearance: Normal appearance. He is normal weight.  HENT:     Head: Normocephalic and atraumatic.     Nose: Nose normal.     Mouth/Throat:     Mouth: Mucous membranes are dry.     Pharynx: Oropharynx is clear.  Eyes:     Extraocular Movements: Extraocular movements intact.     Conjunctiva/sclera: Conjunctivae normal.     Pupils: Pupils are equal, round, and reactive to light.  Neck:     Musculoskeletal: Normal range of motion.  Cardiovascular:     Rate and Rhythm: Normal rate and regular rhythm.     Pulses: Normal pulses.  Pulmonary:     Effort: Pulmonary effort is normal.     Breath sounds: Normal breath sounds.  Abdominal:     General: Abdomen is flat.     Palpations: Abdomen is soft.  Musculoskeletal: Normal range of motion.  Skin:    General: Skin is warm and dry.  Neurological:     Mental Status: He is alert and oriented to person, place, and time.     Cranial Nerves: No cranial nerve deficit.     Sensory: No sensory deficit.     Motor: Weakness present.     Coordination: Coordination normal.     Gait:  Gait abnormal.      Deep Tendon Reflexes: Reflexes normal.  Psychiatric:        Mood and Affect: Mood normal.        Behavior: Behavior normal.        Thought Content: Thought content normal.        Judgment: Judgment normal.   BP (!) 133/103   Pulse 82   Temp 98.3 F (36.8 C) (Oral)   Resp 18   Ht 6' (1.829 m)   Wt 86.6 kg   SpO2 98%   BMI 25.90 kg/m  Johnny [name]. has decided to undergo a lumbar discetomy/decompression for hnp at levels L4/5 on the right side. Risks and benefits including but not limited to bleeding, infection, paralysis, weakness in one or both extremities, bowel and/or bladder dysfunction, need for further surgery, no relief of pain. Johnny [name]. understands and wishes to proceed.

## 2019-11-18 NOTE — Discharge Summary (Signed)
Physician Discharge Summary  Patient ID: Johnny [name]. MRN: [id number] DOB/AGE: Apr 30, 1971 48 y.o.  Admit date: 11/18/2019 Discharge date: 11/18/2019  Admission Diagnoses:hnp L4/5 right  Discharge Diagnoses: same Active Problems:   HNP (herniated nucleus pulposus), lumbar   Discharged Condition: good  Hospital Course: Johnny [name] was admitted and taken to the operating room for an uncomplicated lumbar discetomy at L4/5 on the right side. Post op his wound is clean, dry, and without signs of infection. He is ambulating, voiding, and tolerating a regular diet.   Treatments: surgery: right L4/5 semihemilaminectomy and discetomy  Discharge Exam: Blood pressure 119/84, pulse 88, temperature 97.7 F (36.5 C), temperature source Oral, resp. rate 16, height 6' (1.829 m), weight 86.6 kg, SpO2 99 %. General appearance: alert, cooperative, appears stated age and no distress Neurologic: Alert and oriented X 3, normal strength and tone. Normal symmetric reflexes. Normal coordination and gait  Disposition: Discharge disposition: 01-Home or Self Care      Herniated Nucleus Pulposus  Allergies as of 11/18/2019      Reactions   Crab [shellfish Allergy] Itching, Swelling   Flexeril [cyclobenzaprine] Anaphylaxis   Codeine Itching   Latex Swelling   Gloves(when worn), condoms   Penicillins Nausea And Vomiting   Did it involve swelling of the face/tongue/throat, SOB, or low BP? No Did it involve sudden or severe rash/hives, skin peeling, or any reaction on the inside of your mouth or nose? No Did you need to seek medical attention at a hospital or doctor's office? No When did it last happen?10+years If all above answers are "NO", may proceed with cephalosporin use.   Robaxin [methocarbamol] Itching   Sulfamethoxazole-trimethoprim Nausea Only, Other (See Comments)   Stomach pain    Tramadol Other (See Comments)      Medication List    TAKE these medications    acetaminophen 500 MG tablet Commonly known as: TYLENOL Take 2 tablets (1,000 mg total) by mouth every 6 (six) hours. Take around the clock for the first 48 hours after surgery, then use every 6 hours as needed for mild to moderate pain.   baclofen 10 MG tablet Commonly known as: LIORESAL Take 10 mg by mouth 2 (two) times daily.   diphenhydramine-acetaminophen 25-500 MG Tabs tablet Commonly known as: TYLENOL PM Take 2 tablets by mouth at bedtime.   gabapentin 800 MG tablet Commonly known as: NEURONTIN Take 800 mg by mouth 3 (three) times daily. Take with 600 mg tablet to equal 1400 mg 3 times daily What changed: Another medication with the same name was changed. Make sure you understand how and when to take each.   gabapentin 600 MG tablet Commonly known as: NEURONTIN Take 2 tablets (1,200 mg total) by mouth 3 (three) times daily. What changed:   how much to take  additional instructions   GOODSENSE NASAL ALLERGY SPRAY NA Place 1 spray into the nose daily as needed (allergies).   ibuprofen 200 MG tablet Commonly known as: ADVIL Take 600-800 mg by mouth every 6 (six) hours as needed for moderate pain.   oxyCODONE 5 MG immediate release tablet Commonly known as: Oxy IR/ROXICODONE Take 1 tablet (5 mg total) by mouth 3 (three) times daily as needed for up to 28 days (for pain). What changed: Another medication with the same name was added. Make sure you understand how and when to take each.   oxyCODONE 5 MG immediate release tablet Commonly known as: Oxy IR/ROXICODONE Take 1 tablet (5 mg total) by mouth  every 6 (six) hours as needed for moderate pain ((score 4 to 6)). What changed: You were already taking a medication with the same name, and this prescription was added. Make sure you understand how and when to take each.   tiZANidine 4 MG tablet Commonly known as: ZANAFLEX Take 1 tablet (4 mg total) by mouth every 6 (six) hours as needed for muscle spasms.      Follow-up  Information    [name], MD Follow up in 3 week(s).   Specialty: Neurosurgery Why: please call the office to make an appointment Contact information: [address] [place] [address] [phone]        [name], MD .   Specialty: Neurosurgery Contact information: [address] [place] [address] [phone]           Signed: [name] 11/18/2019, 6:12 PM

## 2019-11-19 ENCOUNTER — Other Ambulatory Visit: Payer: Self-pay

## 2019-11-19 ENCOUNTER — Telehealth: Payer: Self-pay | Admitting: Family Medicine

## 2019-11-19 ENCOUNTER — Emergency Department
Admission: EM | Admit: 2019-11-19 | Discharge: 2019-11-19 | Disposition: A | Discharge status: Home / Self Care | Payer: Medicaid Other | Attending: Emergency Medicine | Admitting: Emergency Medicine

## 2019-11-19 ENCOUNTER — Encounter (HOSPITAL_COMMUNITY): Payer: Self-pay | Admitting: Neurosurgery

## 2019-11-19 DIAGNOSIS — M549 Dorsalgia, unspecified: Secondary | ICD-10-CM | POA: Insufficient documentation

## 2019-11-19 DIAGNOSIS — Z5321 Procedure and treatment not carried out due to patient leaving prior to being seen by health care provider: Secondary | ICD-10-CM | POA: Insufficient documentation

## 2019-11-19 NOTE — ED Triage Notes (Signed)
Pt c/o lower back pain. PT had spinal surgery yesterday at [facility], was sent home with oxycodone and states unable to get pain relief. Incision site clean and dry.

## 2019-11-19 NOTE — ED Notes (Signed)
Pt st leaving now due to long wait 

## 2019-11-19 NOTE — Telephone Encounter (Signed)
He will have to get that from his surgeon. I do not handle post-op pain

## 2019-11-19 NOTE — Telephone Encounter (Signed)
Pt called saying he just left the hospital yesterday for back surgery and was given a prescription of Oxycodone 5mg  every 5 hrs.  He want to know if Dr. Wynetta Emery can give him something for break thur pain,  He states he is in a lot of pain.  I ask patient if he had called his surgeon and he said he did not want to do that because he did not think the surgeon would do that.  He was not pleased with the surgeons bedside manner.  Best contact # [phone]  [place]

## 2019-11-19 NOTE — Telephone Encounter (Signed)
Patient notified and verbalized understanding. 

## 2019-11-21 ENCOUNTER — Other Ambulatory Visit: Payer: Self-pay | Admitting: Family Medicine

## 2019-11-21 MED ORDER — GABAPENTIN 600 MG PO TABS: 1200.0000 mg | ORAL_TABLET | Freq: Three times a day (TID) | ORAL | 1 refills | Status: DC

## 2019-11-21 NOTE — Telephone Encounter (Signed)
Medication Refill - Medication: gabapentin (NEURONTIN) 600 MG tablet  Has the patient contacted their pharmacy? no (Agent: If no, request that the patient contact the pharmacy for the refill.) (Agent: If yes, when and what did the pharmacy advise?)  Preferred Pharmacy (with phone number or street name):  [id number] - [place], [place] [address] [phone] (Phone) [phone] (Fax)    Agent: Please be advised that RX refills may take up to 3 business days. We ask that you follow-up with your pharmacy.

## 2019-11-22 ENCOUNTER — Encounter: Payer: Self-pay | Admitting: Emergency Medicine

## 2019-11-22 ENCOUNTER — Emergency Department: Payer: Medicaid Other

## 2019-11-22 ENCOUNTER — Emergency Department
Admission: EM | Admit: 2019-11-22 | Discharge: 2019-11-22 | Disposition: A | Discharge status: Home / Self Care | Payer: Medicaid Other | Attending: Emergency Medicine | Admitting: Emergency Medicine

## 2019-11-22 ENCOUNTER — Other Ambulatory Visit: Payer: Self-pay

## 2019-11-22 DIAGNOSIS — F17210 Nicotine dependence, cigarettes, uncomplicated: Secondary | ICD-10-CM | POA: Diagnosis not present

## 2019-11-22 DIAGNOSIS — Z79899 Other long term (current) drug therapy: Secondary | ICD-10-CM | POA: Diagnosis not present

## 2019-11-22 DIAGNOSIS — Z91040 Latex allergy status: Secondary | ICD-10-CM | POA: Insufficient documentation

## 2019-11-22 DIAGNOSIS — Z96651 Presence of right artificial knee joint: Secondary | ICD-10-CM | POA: Diagnosis not present

## 2019-11-22 DIAGNOSIS — G8929 Other chronic pain: Secondary | ICD-10-CM

## 2019-11-22 DIAGNOSIS — M545 Low back pain: Secondary | ICD-10-CM | POA: Insufficient documentation

## 2019-11-22 LAB — CBC WITH DIFFERENTIAL/PLATELET
Abs Immature Granulocytes: 0.02 10*3/uL (ref 0.00–0.07)
Basophils Absolute: 0 10*3/uL (ref 0.0–0.1)
Basophils Relative: 1 %
Eosinophils Absolute: 0.2 10*3/uL (ref 0.0–0.5)
Eosinophils Relative: 3 %
HCT: 41.8 % (ref 39.0–52.0)
Hemoglobin: 15.1 g/dL (ref 13.0–17.0)
Immature Granulocytes: 0 %
Lymphocytes Relative: 36 %
Lymphs Abs: 2.8 10*3/uL (ref 0.7–4.0)
MCH: 32.3 pg (ref 26.0–34.0)
MCHC: 36.1 g/dL — ABNORMAL HIGH (ref 30.0–36.0)
MCV: 89.3 fL (ref 80.0–100.0)
Monocytes Absolute: 0.7 10*3/uL (ref 0.1–1.0)
Monocytes Relative: 9 %
Neutro Abs: 4.2 10*3/uL (ref 1.7–7.7)
Neutrophils Relative %: 51 %
Platelets: 253 10*3/uL (ref 150–400)
RBC: 4.68 MIL/uL (ref 4.22–5.81)
RDW: 12.5 % (ref 11.5–15.5)
WBC: 8 10*3/uL (ref 4.0–10.5)
nRBC: 0 % (ref 0.0–0.2)

## 2019-11-22 LAB — BASIC METABOLIC PANEL
Anion gap: 8 (ref 5–15)
BUN: 11 mg/dL (ref 6–20)
CO2: 27 mmol/L (ref 22–32)
Calcium: 8.8 mg/dL — ABNORMAL LOW (ref 8.9–10.3)
Chloride: 104 mmol/L (ref 98–111)
Creatinine, Ser: 0.83 mg/dL (ref 0.61–1.24)
GFR calc Af Amer: >60 mL/min (ref >60)
GFR calc non Af Amer: >60 mL/min (ref >60)
Glucose, Bld: 110 mg/dL — ABNORMAL HIGH (ref 70–99)
Potassium: 3.9 mmol/L (ref 3.5–5.1)
Sodium: 139 mmol/L (ref 135–145)

## 2019-11-22 MED ORDER — GADOBUTROL 1 MMOL/ML IV SOLN
9.0000 mL | Freq: Once | INTRAVENOUS | Status: AC | PRN
  Administered 2019-11-22: 9 mL via INTRAVENOUS

## 2019-11-22 MED ORDER — HYDROMORPHONE HCL 1 MG/ML IJ SOLN
1.0000 mg | INTRAMUSCULAR | Status: DC | PRN
  Administered 2019-11-22 (×2): 1 mg via INTRAVENOUS
  Filled 2019-11-22 (×2): qty 1

## 2019-11-22 NOTE — Discharge Instructions (Signed)
Thank you for letting us take care of you in the emergency department today.   Please continue to take any regular, prescribed medications.   Please follow up with: - Your surgeon's to review your ER visit and follow up on your symptoms.   Please return to the ER for any new or worsening symptoms.

## 2019-11-22 NOTE — ED Triage Notes (Signed)
States had back surgery 2 days ago. States yesterday tripped on stairs and went almost to ground, caught self with hand. States pain more severe since that episode. States his post op medications had not been able to control his pain.

## 2019-11-22 NOTE — ED Notes (Signed)
Pt requesting pain medication before his MRI scan so that he would be able to try and lay on his back for the scan

## 2019-11-22 NOTE — ED Notes (Signed)
Patient transported to MRI 

## 2019-11-22 NOTE — ED Provider Notes (Signed)
[facility] Emergency Department Provider Note  ____________________________________________   First MD Initiated Contact with Patient 11/22/19 1216     (approximate)  I have reviewed the triage vital signs and the nursing notes.   HISTORY  Chief Complaint Back Pain    HPI Johnny Sera. is a 48 y.o. male with back pain. Patient was admitted on 11/18/2019 for uncomplicated lumbar discetomy at L4/5 on the right side s/p  right L4/5 semihemilaminectomy and discetomy by neurosurgery, Dr. Franky Macho.  Patient presents today because he tripped on the stairs and caught himself with his hands.  Now he is having more severe pain.  Patient also came on 11/25 but left due to the long wait.  Patient states that his pain has been severe since his surgery however yesterday he caught himself when he tripped up the stairs.  He did not land on his back but he is having severe pain in his back worse with movement, better at rest, not better with oxycodone, constant, nonradiating.  He says his sciatica pain is better since having surgery but his midline tenderness is a lot worse.    Past Medical History:  Diagnosis Date  . Arthritis    hands, knees, lower back  . Bilateral sciatica   . Carpal tunnel syndrome   . Chronic back pain   . Chronic neck pain   . Chronic pain syndrome   . GERD (gastroesophageal reflux disease)   . Hand joint pain 07/02/2013  . Herniated disc   . Traumatic amputation of left index finger 2013  . Wears dentures    full upper and lower    Patient Active Problem List   Diagnosis Date Noted  . HNP (herniated nucleus pulposus), lumbar 11/18/2019  . Umbilical hernia without obstruction and without gangrene 10/23/2019  . Osteoarthritis of spine with radiculopathy, lumbar region 10/09/2019  . Chronic bilateral low back pain with bilateral sciatica 10/07/2019  . S/P cervical spinal fusion (C5-C6 ACDF) 07/15/2019  . Cervical radicular pain  07/15/2019  . Cervical facet joint syndrome 07/15/2019  . Degeneration of lumbar intervertebral disc 07/03/2019  . Lumbar radicular pain 07/03/2019  . Cervical spondylosis 04/17/2019  . Homeless single person 04/17/2019  . History of total knee replacement, right 04/25/2018  . Pancreatic mass 01/14/2018  . Tobacco abuse 01/14/2018  . Chronic neck pain   . Benign neoplasm of descending colon   . Benign neoplasm of ascending colon   . Intractable vomiting with nausea   . Reflux esophagitis   . Duodenal ulcer without hemorrhage or perforation   . Chronic pain syndrome 08/07/2017  . Vitamin D deficiency 06/20/2017  . Insomnia 06/19/2017  . Right sided sciatica   . Presence of artificial knee joint 01/17/2013  . Knee pain 01/17/2013  . 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 AMPUTATION FINGER / THUMB Left 2013  . COLONOSCOPY WITH PROPOFOL N/A 01/03/2018   Procedure: COLONOSCOPY WITH PROPOFOL;  Surgeon: Midge Minium, MD;  Location: [facility];  Service: Endoscopy;  Laterality: N/A;  . ESOPHAGOGASTRODUODENOSCOPY (EGD) WITH PROPOFOL N/A 01/03/2018   Procedure: ESOPHAGOGASTRODUODENOSCOPY (EGD) WITH PROPOFOL;  Surgeon: Midge Minium, MD;  Location: [facility];  Service: Endoscopy;  Laterality: N/A;  . HERNIA REPAIR Right    right inguinal hernia repaired twice  . HIP SURGERY    . JOINT REPLACEMENT Right    knee  .  KNEE SURGERY Right   . LUMBAR LAMINECTOMY/DECOMPRESSION MICRODISCECTOMY N/A 11/18/2019   Procedure: LUMBAR FOUR-FIVE LUMBAR LAMINECTOMY/DECOMPRESSION MICRODISCECTOMY;  Surgeon: Coletta Memos, MD;  Location: [facility];  Service: Neurosurgery;  Laterality: N/A;  . neck fusion    . POLYPECTOMY N/A 01/03/2018   Procedure: POLYPECTOMY;  Surgeon: Midge Minium, MD;  Location: [facility];  Service: Endoscopy;  Laterality: N/A;  . REPLACEMENT TOTAL KNEE Right   .  SHOULDER SURGERY Right 2016 X 2  . SPINAL FUSION    . TONSILLECTOMY    . UMBILICAL HERNIA REPAIR N/A 11/07/2019   Procedure: HERNIA REPAIR UMBILICAL ADULT;  Surgeon: Duanne Guess, MD;  Location: [facility];  Service: General;  Laterality: N/A;    Prior to Admission medications   Medication Sig Start Date End Date Taking? Authorizing Provider  acetaminophen (TYLENOL) 500 MG tablet Take 2 tablets (1,000 mg total) by mouth every 6 (six) hours. Take around the clock for the first 48 hours after surgery, then use every 6 hours as needed for mild to moderate pain. 11/07/19 11/06/20  Duanne Guess, MD  baclofen (LIORESAL) 10 MG tablet Take 10 mg by mouth 2 (two) times daily.    [provider]  diphenhydramine-acetaminophen (TYLENOL PM) 25-500 MG TABS tablet Take 2 tablets by mouth at bedtime.    [provider]  gabapentin (NEURONTIN) 600 MG tablet Take 2 tablets (1,200 mg total) by mouth 3 (three) times daily. 11/21/19   Johnson, Megan P, DO  gabapentin (NEURONTIN) 800 MG tablet Take 800 mg by mouth 3 (three) times daily. Take with 600 mg tablet to equal 1400 mg 3 times daily    [provider]  ibuprofen (ADVIL) 200 MG tablet Take 600-800 mg by mouth every 6 (six) hours as needed for moderate pain.    [provider]  oxyCODONE (OXY IR/ROXICODONE) 5 MG immediate release tablet Take 1 tablet (5 mg total) by mouth 3 (three) times daily as needed for up to 28 days (for pain). 11/11/19 12/09/19  Olevia Perches P, DO  oxyCODONE (OXY IR/ROXICODONE) 5 MG immediate release tablet Take 1 tablet (5 mg total) by mouth every 6 (six) hours as needed for moderate pain ((score 4 to 6)). 11/18/19   Coletta Memos, MD  tiZANidine (ZANAFLEX) 4 MG tablet Take 1 tablet (4 mg total) by mouth every 6 (six) hours as needed for muscle spasms. 11/18/19   Coletta Memos, MD  Triamcinolone Acetonide (GOODSENSE NASAL ALLERGY SPRAY NA) Place 1 spray into the nose daily as needed (allergies).     [provider]    Allergies Parke Simmers allergy], Flexeril [cyclobenzaprine], Codeine, Latex, Penicillins, Robaxin [methocarbamol], Sulfamethoxazole-trimethoprim, and Tramadol  Family History  Problem Relation Age of Onset  . Cancer Father        sarcoma  . Cirrhosis Paternal Grandmother   . Cancer Paternal Grandfather        Pancreatic  . Fibromyalgia Sister   . Deafness Sister     Social History Social History   Tobacco Use  . Smoking status: Current Every Day Smoker    Packs/day: 0.50    Years: 25.00    Pack years: 12.50    Types: Cigarettes  . Smokeless tobacco: Never Used  Substance Use Topics  . Alcohol use: No  . Drug use: No      Review of Systems Constitutional: No fever/chills Eyes: No visual changes. ENT: No sore throat. Cardiovascular: Denies chest pain. Respiratory: Denies shortness of breath. Gastrointestinal: No abdominal pain.  No  nausea, no vomiting.  No diarrhea.  No constipation. Genitourinary: Negative for dysuria. Musculoskeletal: + back pain and leg weakness  Skin: Negative for rash. Neurological: Negative for headaches, focal weakness or numbness. All other ROS negative ____________________________________________   PHYSICAL EXAM:  VITAL SIGNS: ED Triage Vitals  Enc Vitals Group     BP 11/22/19 1119 (!) 168/114     Pulse Rate 11/22/19 1119 (!) 104     Resp 11/22/19 1119 20     Temp 11/22/19 1119 98.4 F (36.9 C)     Temp Source 11/22/19 1119 Oral     SpO2 11/22/19 1119 100 %     Weight 11/22/19 1120 192 lb (87.1 kg)     Height 11/22/19 1120 6' (1.829 m)     Head Circumference --      Peak Flow --      Pain Score 11/22/19 1120 10     Pain Loc --      Pain Edu? --      Excl. in GC? --     Constitutional: Alert and oriented. Well appearing and in no acute distress. Eyes: Conjunctivae are normal. EOMI. Head: Atraumatic. Nose: No congestion/rhinnorhea. Mouth/Throat: Mucous membranes are moist.   Neck: No  stridor. Trachea Midline. FROM Cardiovascular: Normal rate, regular rhythm. Grossly normal heart sounds.  Good peripheral circulation. Respiratory: Normal respiratory effort.  No retractions. Lungs CTAB. Gastrointestinal: Soft and nontender. No distention. No abdominal bruits.  Musculoskeletal: No lower extremity tenderness nor edema.  No joint effusions.  Slight weakness of his right leg.  Sensation intact bilaterally.  Good distal pulses bilaterally. Back: Midline tenderness on his lumbar spine.  Well-healing surgical site. Neurologic:  Normal speech and language. No gross focal neurologic deficits are appreciated.  Skin:  Skin is warm, dry and intact. No rash noted. Psychiatric: Mood and affect are normal. Speech and behavior are normal. GU: Deferred   ____________________________________________   LABS (all labs ordered are listed, but only abnormal results are displayed)  Labs Reviewed  CBC WITH DIFFERENTIAL/PLATELET - Abnormal; Notable for the following components:      Result Value   MCHC 36.1 (*)    All other components within normal limits  BASIC METABOLIC PANEL - Abnormal; Notable for the following components:   Glucose, Bld 110 (*)    Calcium 8.8 (*)    All other components within normal limits   ____________________________________________   RADIOLOGY   Official radiology report(s): Mr Lumbar Spine W Wo Contrast  Result Date: 11/22/2019 CLINICAL DATA:  Two days post lumbar surgery with worsening back pain. Fall today. EXAM: MRI LUMBAR SPINE WITHOUT AND WITH CONTRAST TECHNIQUE: Multiplanar and multiecho pulse sequences of the lumbar spine were obtained without and with intravenous contrast. CONTRAST:  9mL GADAVIST GADOBUTROL 1 MMOL/ML IV SOLN COMPARISON:  10/06/2019 FINDINGS: Segmentation:  Standard lumbar numbering Alignment:  Physiologic Vertebrae: Interval right laminotomy at L4-5. No fracture, discitis, or aggressive bone lesion. There is mild discogenic edema the  L4-5 disc space. Conus medullaris and cauda equina: Conus extends to the T12-L1 level. Conus and cauda equina appear normal. Paraspinal and other soft tissues: Small subcutaneous seroma at the operative site, non worrisome. Small volume fluid at the L4 right laminectomy site, expected and without mass effect. Disc levels: T12- L1: Spondylosis.  No impingement L1-L2: Unremarkable. L2-L3: Mild annulus bulging L3-L4: Disc narrowing and bulging with posterior annular fissure. Mild facet spurring. No impingement L4-L5: Disc narrowing and bulging with endplate spurring. Downward migrating right paracentral extrusion with  right L5 impingement, degree of impingement similar to preoperative study. Mild right foraminal narrowing with partial effacement of perineural fat mainly due to bulging disc. L5-S1:Disc narrowing and bulging with endplate ridging. Degenerative facet spurring asymmetric to the left. Borderline moderate left foraminal narrowing comma stenosis less prominent than on outside comparison. Right foraminal narrowing is mild and the spinal canal is patent. IMPRESSION: Recent L4-5 right laminotomy with residual or recurrent inferiorly migrating right paracentral extrusion that impinges on the right L5 nerve root in the subarticular recess. Electronically Signed   By: Marnee Spring M.D.   On: 11/22/2019 15:29    ____________________________________________   PROCEDURES  Procedure(s) performed (including Critical Care):  Procedures   ____________________________________________   INITIAL IMPRESSION / ASSESSMENT AND PLAN / ED COURSE  Johnny [name]. was evaluated in Emergency Department on 11/22/2019 for the symptoms described in the history of present illness. He was evaluated in the context of the global COVID-19 pandemic, which necessitated consideration that the patient might be at risk for infection with the SARS-CoV-2 virus that causes COVID-19. Institutional protocols and algorithms  that pertain to the evaluation of patients at risk for COVID-19 are in a state of rapid change based on information released by regulatory bodies including the CDC and federal and state organizations. These policies and algorithms were followed during the patient's care in the ED.    Patient presents for back pain after a recent surgery at [facility].  Will get MRI to evaluate for epidural abscess, hematoma, cord compression given patient is slightly weak on his right leg.  However the weakness has been present since his surgery so is most likely just typical finding after the surgery.  He has no cellulitis noted over the surgical scar.  Discussed with radiology who recommends MRI with and without contrast.  MRI was negative except for residual impingement on the L5 nerve root.  This would explain patient's weakness.  I discussed with patient I would not feel further oxycodone given he already has a prescription and that he needs to follow-up with his neurosurgeon.  Patient feels comfortable with this plan.   ____________________________________________   FINAL CLINICAL IMPRESSION(S) / ED DIAGNOSES   Final diagnoses:  Acute exacerbation of chronic low back pain      MEDICATIONS GIVEN DURING THIS VISIT:  Medications  HYDROmorphone (DILAUDID) injection 1 mg (1 mg Intravenous Given 11/22/19 1427)  gadobutrol (GADAVIST) 1 MMOL/ML injection 9 mL (9 mLs Intravenous Contrast Given 11/22/19 1516)     ED Discharge Orders    None       Note:  This document was prepared using Dragon voice recognition software and may include unintentional dictation errors.   Concha Se, MD 11/22/19 [phone]

## 2019-11-25 ENCOUNTER — Telehealth: Payer: Self-pay | Admitting: Family Medicine

## 2019-11-25 ENCOUNTER — Ambulatory Visit: Payer: Self-pay | Admitting: Licensed Clinical Social Worker

## 2019-11-25 DIAGNOSIS — Z59819 Housing instability, housed unspecified: Secondary | ICD-10-CM

## 2019-11-25 DIAGNOSIS — Z599 Problem related to housing and economic circumstances, unspecified: Secondary | ICD-10-CM

## 2019-11-25 NOTE — Telephone Encounter (Signed)
° ° °  Called pt regarding Johnny [name] Referral for Housing assistance. Pt has been working with LCSW for help with finding other housing as where he was staying was unstable, pt also reached out to Johnny [name] at [facility] and she passed along the information regarding his request. He is going to be moving into a 1 BR apt in [place] today at [address] [place], [place] [address]. He stated that he is struggling with unexpected expenses like new tires for his car. He told me that he was not able to go to the [employer] last month as Johnny [name] had suggested as he had a felony and they were not willing to work with him.  Johnny [name] was his landlord at his previous residence and is the landlord at the [facility] apartment as well. Will call him directly to obtain signed W9 form in order to submit payment to [facility] for assistance with Johnny [name] rent payment.    [place] Management ??[email or web address]   ??DT:[id number]

## 2019-11-25 NOTE — Chronic Care Management (AMB) (Signed)
  Care Management   Follow Up Note   11/25/2019 Name: Johnny [name]. MRN: [id number] DOB: [birth year]  Referred by: Valerie Roys, DO Reason for referral : Care Coordination   Johnny [name]. is a 48 y.o. year old male who is a primary care patient of Valerie Roys, DO. The care management team was consulted for assistance with care management and care coordination needs.    Review of patient status, including review of consultants reports, relevant laboratory and other test results, and collaboration with appropriate care team members and the patient's provider was performed as part of comprehensive patient evaluation and provision of chronic care management services.    CCM LCSW completed care coordination with Care Guide on 11/25/2019. She reports that she received an email from [facility] and patient reached out to program and they are agreeable to provide some financial assistance for patient to alleviate his financial strain regarding his housing. Pt reports that he will lose his current residence soon. Patient is still trying to apply for disability and has been encouraged in the past by CCM LCSW to go to the Social Security Office to do enroll in services. Care Guide will contact patient today to provide update, inquire where he is on the wait list for Public Housing and to provide additional housing/community resource education. LCSW moved up patient's appointment from 12/29/19 to 12/12/19 due to current housing needs.  The care management team will reach out to the patient again over the next 30 days.   [name], BSW, [name], [facility] Care Management [email or web address] Phone: [phone]

## 2019-11-25 NOTE — Telephone Encounter (Signed)
° ° °  Called pt's landlord with request for signed w9 left msg to cb   [place] Management ??[email or web address]   ??[id number]

## 2019-11-27 ENCOUNTER — Other Ambulatory Visit: Payer: Self-pay

## 2019-11-27 ENCOUNTER — Telehealth (INDEPENDENT_AMBULATORY_CARE_PROVIDER_SITE_OTHER): Payer: Medicaid Other | Admitting: General Surgery

## 2019-11-27 DIAGNOSIS — K429 Umbilical hernia without obstruction or gangrene: Secondary | ICD-10-CM

## 2019-11-27 NOTE — Telephone Encounter (Signed)
Spoke with landlord regarding receipt of w9

## 2019-11-27 NOTE — Telephone Encounter (Signed)
From: Jill Alexanders [facility])  Sent: Thursday, November 27, 2019 4:29 PM To: [place], [place] [email or web address]> Cc: Burnett Harry [email or web address]> Subject: Secure: Neta Ehlers ECC Application  Fantastic thank you so much!!  Here is the signed w9 from his landlord Carnella Guadalajara His address for new apartment is [address] [place], [place] [address]  Let me know if you need anything else!     From: Celso Amy, [place] [email or web address]>  Sent: Thursday, November 27, 2019 4:03 PM To: Jill Alexanders [facility]) [email or web address]> Cc: Burnett Harry [email or web address]> Subject: Re: SECURE: Patient Assistance  Yes, we can do that. Attached is the blank W-9. We can use the emails below to serve as the application. Thanks.   Martinique Wood  [place]   [facility]  Program Manager Direct Dial: [phone]   Fax: [phone]  Website: [email or web address]  The purpose of the [facility] is to seek ways to further the Hospitals mission to improve the health of the citizens of [facility] and its surrounding communities.    From: Jill Alexanders [facility]) @[email or web address]> Sent: Thursday, November 27, 2019 3:58 PM To: [place], [place] [email or web address]> Cc: Burnett Harry [email or web address]> Subject: RE: SECURE: Patient Assistance    Hi [place], I connected with Mr. Brodzik and he told me that he was moving into the new apartment and that the landlord was the same as from his previous location.  I have spoken with the landlord and he agreed to provide w9 if you are okay with paying this months rent for him I am planning to pass along affordable housing options for him for the future specifically a list of boarding homes that would be more in his budget. I  think his circumstance was that he needed to move out of a bad situation quickly and this became available.   Let me know what you think!   Thanks again for sending this to our team!    From: Celso Amy, [place] @[email or web address]>  Sent: Monday, November 24, 2019 12:59 PM To: Jill Alexanders [facility]) @[email or web address]> Cc: Burnett Harry @ .[email or web address]> Subject: SECURE: Patient Assistance   Derwood Kaplan you had a blessed Thanksgiving!   I spoke to a [facility] patient, Jhase Rothwell DOB 2071-10-28 Dietrich Pates is he case manager there but I don't have her info. Can you please call today and see how we can help?  [phone].   Here is what I know but we don't have access to Epic and patients aren't supposed to contact us directly.   He mentioned he had a couple of hernia surgeries and just had a spinal surgery from [facility]. Currently, he is renting a room for $40 a month but today is his last day and then he has to move. Monthly, he only gets $781 for social security and is working on/or trying to figure out how to get on disability. The place he wants to move in is $500 a month and he obviously can't afford that with bills, etc. I think he would definitely qualify for assistance (I would say yes ??) but his plan is pretty shaky and helping him get in to somewhere he can't afford, would not be using our donor dollars wisely. Maybe we can help with $500 towards something else?   Please let him know I have to hand it over to you all at this point. If you have any questions, you know where to find me ??     [place]ique Wood  [place]  Program Manager Direct Dial: [phone]   Fax: [phone]  Website: [email or web address]  The purpose of the [facility] is to seek ways to further the  Hospitals mission to improve the health of the citizens of [facility] and its surrounding communities.

## 2019-11-27 NOTE — Anesthesia Postprocedure Evaluation (Signed)
Anesthesia Post Note  Patient: Johnny [name].  Procedure(s) Performed: LUMBAR FOUR-FIVE LUMBAR LAMINECTOMY/DECOMPRESSION MICRODISCECTOMY (N/A )     Patient location during evaluation: PACU Anesthesia Type: General Level of consciousness: awake and alert Pain management: pain level controlled Vital Signs Assessment: post-procedure vital signs reviewed and stable Respiratory status: spontaneous breathing, nonlabored ventilation, respiratory function stable and patient connected to nasal cannula oxygen Cardiovascular status: blood pressure returned to baseline and stable Postop Assessment: no apparent nausea or vomiting Anesthetic complications: no    Last Vitals:  Vitals:   11/18/19 1219 11/18/19 1533  BP: 119/79 119/84  Pulse: (!) 103 88  Resp: 16 16  Temp: 36.6 C 36.5 C  SpO2: 98% 99%    Last Pain:  Vitals:   11/18/19 1724  TempSrc:   PainSc: 7                  Keon Benscoter

## 2019-11-27 NOTE — Progress Notes (Signed)
Virtual Visit via Telephone Note  I connected with Johnny [name]. on 11/27/19 at  9:00 AM EST by telephone and verified that I am speaking with the correct person using two identifiers.   I discussed the limitations, risks, security and privacy concerns of performing an evaluation and management service by telephone and the availability of in person appointments. I also discussed with the patient that there may be a patient responsible charge related to this service. The patient expressed understanding and agreed to proceed.   History of Present Illness: He is a 48 year old man who had an open umbilical hernia repair on November 13.  He had 2 small defects that were primarily repaired without mesh.   Observations/Objective: He states that he has done well from the hernia repair surgery.  He had back surgery on November 24 and is having some complications related to that.  He denies any fevers or chills.  No nausea or vomiting.  His pain at the umbilical hernia site is well controlled.  He states that the incision does not appear red, inflamed, or infected.  There is no drainage from the site.  Assessment and Plan: This is a 48 year old man who had an umbilical hernia repair.  He is doing well from this operation.  He did have back surgery and is experiencing some complications related to that procedure.  Follow Up Instructions: He should continue to refrain from lifting anything heavier than 10 pounds for a total of 6 weeks from the time of his surgery.  Otherwise, we will see him on an as-needed basis.   I discussed the assessment and treatment plan with the patient. The patient was provided an opportunity to ask questions and all were answered. The patient agreed with the plan and demonstrated an understanding of the instructions.   The patient was advised to call back or seek an in-person evaluation if the symptoms worsen or if the condition fails to improve as anticipated.  I  provided 5 minutes of non-face-to-face time during this encounter.   [name], MD

## 2019-11-28 ENCOUNTER — Inpatient Hospital Stay (HOSPITAL_COMMUNITY)
Admission: AD | Admit: 2019-11-28 | Discharge: 2019-12-01 | DRG: 030 | Disposition: A | Discharge status: Home / Self Care | Payer: Medicaid Other | Source: Ambulatory Visit | Attending: Neurosurgery | Admitting: Neurosurgery

## 2019-11-28 ENCOUNTER — Inpatient Hospital Stay (HOSPITAL_COMMUNITY): Payer: Medicaid Other | Admitting: Registered Nurse

## 2019-11-28 ENCOUNTER — Encounter (HOSPITAL_COMMUNITY)
Admission: AD | Disposition: A | Discharge status: Home / Self Care | Payer: Self-pay | Source: Ambulatory Visit | Attending: Neurosurgery

## 2019-11-28 DIAGNOSIS — Z791 Long term (current) use of non-steroidal anti-inflammatories (NSAID): Secondary | ICD-10-CM | POA: Diagnosis not present

## 2019-11-28 DIAGNOSIS — Z79891 Long term (current) use of opiate analgesic: Secondary | ICD-10-CM

## 2019-11-28 DIAGNOSIS — G9600 Cerebrospinal fluid leak, unspecified: Secondary | ICD-10-CM | POA: Diagnosis present

## 2019-11-28 DIAGNOSIS — Z20828 Contact with and (suspected) exposure to other viral communicable diseases: Secondary | ICD-10-CM | POA: Diagnosis present

## 2019-11-28 DIAGNOSIS — G894 Chronic pain syndrome: Secondary | ICD-10-CM | POA: Diagnosis present

## 2019-11-28 DIAGNOSIS — G9782 Other postprocedural complications and disorders of nervous system: Secondary | ICD-10-CM | POA: Diagnosis present

## 2019-11-28 DIAGNOSIS — Z89022 Acquired absence of left finger(s): Secondary | ICD-10-CM

## 2019-11-28 DIAGNOSIS — Z79899 Other long term (current) drug therapy: Secondary | ICD-10-CM

## 2019-11-28 HISTORY — PX: REPAIR OF CEREBROSPINAL FLUID LEAK: SHX6322

## 2019-11-28 HISTORY — DX: Cerebrospinal fluid leak, unspecified: G96.00

## 2019-11-28 LAB — COMPREHENSIVE METABOLIC PANEL
ALT: 21 U/L (ref 0–44)
AST: 21 U/L (ref 15–41)
Albumin: 4.2 g/dL (ref 3.5–5.0)
Alkaline Phosphatase: 103 U/L (ref 38–126)
Anion gap: 13 (ref 5–15)
BUN: 15 mg/dL (ref 6–20)
CO2: 22 mmol/L (ref 22–32)
Calcium: 9.4 mg/dL (ref 8.9–10.3)
Chloride: 104 mmol/L (ref 98–111)
Creatinine, Ser: 0.98 mg/dL (ref 0.61–1.24)
GFR calc Af Amer: >60 mL/min (ref >60)
GFR calc non Af Amer: >60 mL/min (ref >60)
Glucose, Bld: 108 mg/dL — ABNORMAL HIGH (ref 70–99)
Potassium: 3.9 mmol/L (ref 3.5–5.1)
Sodium: 139 mmol/L (ref 135–145)
Total Bilirubin: 0.6 mg/dL (ref 0.3–1.2)
Total Protein: 7.1 g/dL (ref 6.5–8.1)

## 2019-11-28 LAB — CBC WITH DIFFERENTIAL/PLATELET
Abs Immature Granulocytes: 0.03 10*3/uL (ref 0.00–0.07)
Basophils Absolute: 0 10*3/uL (ref 0.0–0.1)
Basophils Relative: 0 %
Eosinophils Absolute: 0 10*3/uL (ref 0.0–0.5)
Eosinophils Relative: 0 %
HCT: 48.2 % (ref 39.0–52.0)
Hemoglobin: 16.9 g/dL (ref 13.0–17.0)
Immature Granulocytes: 0 %
Lymphocytes Relative: 32 %
Lymphs Abs: 2.8 10*3/uL (ref 0.7–4.0)
MCH: 32.3 pg (ref 26.0–34.0)
MCHC: 35.1 g/dL (ref 30.0–36.0)
MCV: 92 fL (ref 80.0–100.0)
Monocytes Absolute: 0.8 10*3/uL (ref 0.1–1.0)
Monocytes Relative: 9 %
Neutro Abs: 5 10*3/uL (ref 1.7–7.7)
Neutrophils Relative %: 59 %
Platelets: 261 10*3/uL (ref 150–400)
RBC: 5.24 MIL/uL (ref 4.22–5.81)
RDW: 12.4 % (ref 11.5–15.5)
WBC: 8.6 10*3/uL (ref 4.0–10.5)
nRBC: 0 % (ref 0.0–0.2)

## 2019-11-28 LAB — APTT: aPTT: 28 seconds (ref 24–36)

## 2019-11-28 LAB — PROTIME-INR
INR: 0.9 (ref 0.8–1.2)
Prothrombin Time: 12.4 seconds (ref 11.4–15.2)

## 2019-11-28 LAB — SURGICAL PCR SCREEN
MRSA, PCR: NEGATIVE
Staphylococcus aureus: NEGATIVE

## 2019-11-28 LAB — SARS CORONAVIRUS 2 BY RT PCR (HOSPITAL ORDER, PERFORMED IN ~~LOC~~ HOSPITAL LAB): SARS Coronavirus 2: NEGATIVE

## 2019-11-28 LAB — HIV ANTIBODY (ROUTINE TESTING W REFLEX): HIV Screen 4th Generation wRfx: NONREACTIVE

## 2019-11-28 SURGERY — REPAIR OF CEREBROSPINAL FLUID LEAK
Anesthesia: General

## 2019-11-28 MED ORDER — ONDANSETRON HCL 4 MG/2ML IJ SOLN: 4.0000 mg | Freq: Four times a day (QID) | INTRAMUSCULAR | Status: DC | PRN

## 2019-11-28 MED ORDER — DIAZEPAM 5 MG PO TABS
5.0000 mg | ORAL_TABLET | Freq: Four times a day (QID) | ORAL | Status: DC | PRN
  Administered 2019-11-28 – 2019-12-01 (×5): 5 mg via ORAL
  Filled 2019-11-28 (×5): qty 1

## 2019-11-28 MED ORDER — MENTHOL 3 MG MT LOZG: 1.0000 | LOZENGE | OROMUCOSAL | Status: DC | PRN

## 2019-11-28 MED ORDER — MIDAZOLAM HCL 2 MG/2ML IJ SOLN
INTRAMUSCULAR | Status: AC
  Filled 2019-11-28: qty 2

## 2019-11-28 MED ORDER — SODIUM CHLORIDE 0.9% FLUSH: 3.0000 mL | Freq: Two times a day (BID) | INTRAVENOUS | Status: DC

## 2019-11-28 MED ORDER — SODIUM CHLORIDE 0.9% FLUSH: 3.0000 mL | INTRAVENOUS | Status: DC | PRN

## 2019-11-28 MED ORDER — SENNOSIDES-DOCUSATE SODIUM 8.6-50 MG PO TABS: 1.0000 | ORAL_TABLET | Freq: Every evening | ORAL | Status: DC | PRN

## 2019-11-28 MED ORDER — PROPOFOL 10 MG/ML IV BOLUS
INTRAVENOUS | Status: DC | PRN
  Administered 2019-11-28: 50 mg via INTRAVENOUS
  Administered 2019-11-28: 150 mg via INTRAVENOUS

## 2019-11-28 MED ORDER — HYDROMORPHONE HCL 1 MG/ML IJ SOLN
0.2500 mg | INTRAMUSCULAR | Status: DC | PRN
  Administered 2019-11-28 (×4): 0.5 mg via INTRAVENOUS

## 2019-11-28 MED ORDER — DEXAMETHASONE SODIUM PHOSPHATE 10 MG/ML IJ SOLN
INTRAMUSCULAR | Status: AC
  Filled 2019-11-28: qty 1

## 2019-11-28 MED ORDER — OXYCODONE HCL 5 MG PO TABS: 5.0000 mg | ORAL_TABLET | ORAL | Status: DC | PRN

## 2019-11-28 MED ORDER — GABAPENTIN 800 MG PO TABS
800.0000 mg | ORAL_TABLET | Freq: Three times a day (TID) | ORAL | Status: DC
  Administered 2019-11-28: 800 mg via ORAL
  Filled 2019-11-28 (×2): qty 1

## 2019-11-28 MED ORDER — LIDOCAINE 2% (20 MG/ML) 5 ML SYRINGE
INTRAMUSCULAR | Status: AC
  Filled 2019-11-28: qty 5

## 2019-11-28 MED ORDER — KETAMINE HCL 10 MG/ML IJ SOLN
INTRAMUSCULAR | Status: DC | PRN
  Administered 2019-11-28: 40 mg via INTRAVENOUS
  Administered 2019-11-28: 10 mg via INTRAVENOUS

## 2019-11-28 MED ORDER — POTASSIUM CHLORIDE IN NACL 20-0.9 MEQ/L-% IV SOLN
INTRAVENOUS | Status: DC
  Administered 2019-11-28: 17:00:00 via INTRAVENOUS
  Filled 2019-11-28 (×3): qty 1000

## 2019-11-28 MED ORDER — PROPOFOL 10 MG/ML IV BOLUS
INTRAVENOUS | Status: AC
  Filled 2019-11-28: qty 20

## 2019-11-28 MED ORDER — PHENOL 1.4 % MT LIQD: 1.0000 | OROMUCOSAL | Status: DC | PRN

## 2019-11-28 MED ORDER — GLYCOPYRROLATE PF 0.2 MG/ML IJ SOSY
PREFILLED_SYRINGE | INTRAMUSCULAR | Status: DC | PRN
  Administered 2019-11-28: .1 mg via INTRAVENOUS

## 2019-11-28 MED ORDER — FENTANYL CITRATE (PF) 100 MCG/2ML IJ SOLN
INTRAMUSCULAR | Status: DC | PRN
  Administered 2019-11-28 (×2): 100 ug via INTRAVENOUS
  Administered 2019-11-28: 50 ug via INTRAVENOUS
  Administered 2019-11-28: 100 ug via INTRAVENOUS
  Administered 2019-11-28: 50 ug via INTRAVENOUS

## 2019-11-28 MED ORDER — SODIUM CHLORIDE 0.9% FLUSH
3.0000 mL | Freq: Two times a day (BID) | INTRAVENOUS | Status: DC
  Administered 2019-11-30 – 2019-12-01 (×3): 3 mL via INTRAVENOUS

## 2019-11-28 MED ORDER — DEXMEDETOMIDINE HCL 200 MCG/2ML IV SOLN
INTRAVENOUS | Status: DC | PRN
  Administered 2019-11-28: 28 ug via INTRAVENOUS

## 2019-11-28 MED ORDER — PROMETHAZINE HCL 25 MG/ML IJ SOLN: 6.2500 mg | INTRAMUSCULAR | Status: DC | PRN

## 2019-11-28 MED ORDER — HYDROMORPHONE HCL 1 MG/ML IJ SOLN
INTRAMUSCULAR | Status: AC
  Filled 2019-11-28: qty 1

## 2019-11-28 MED ORDER — HEMOSTATIC AGENTS (NO CHARGE) OPTIME
TOPICAL | Status: DC | PRN
  Administered 2019-11-28: 1 via TOPICAL

## 2019-11-28 MED ORDER — ACETAMINOPHEN 10 MG/ML IV SOLN
1000.0000 mg | Freq: Once | INTRAVENOUS | Status: DC | PRN
  Administered 2019-11-28: 1000 mg via INTRAVENOUS

## 2019-11-28 MED ORDER — GABAPENTIN 400 MG PO CAPS
800.0000 mg | ORAL_CAPSULE | Freq: Three times a day (TID) | ORAL | Status: DC
  Administered 2019-11-28 – 2019-12-01 (×9): 800 mg via ORAL
  Filled 2019-11-28 (×9): qty 2

## 2019-11-28 MED ORDER — ACETAMINOPHEN 650 MG RE SUPP: 650.0000 mg | Freq: Four times a day (QID) | RECTAL | Status: DC | PRN

## 2019-11-28 MED ORDER — BACITRACIN ZINC 500 UNIT/GM EX OINT
TOPICAL_OINTMENT | CUTANEOUS | Status: DC | PRN
  Administered 2019-11-28: 1 via TOPICAL

## 2019-11-28 MED ORDER — MORPHINE SULFATE (PF) 4 MG/ML IV SOLN
3.0000 mg | Freq: Once | INTRAVENOUS | Status: AC
  Administered 2019-11-28: 3 mg via INTRAMUSCULAR
  Filled 2019-11-28: qty 1

## 2019-11-28 MED ORDER — PHENYLEPHRINE 40 MCG/ML (10ML) SYRINGE FOR IV PUSH (FOR BLOOD PRESSURE SUPPORT)
PREFILLED_SYRINGE | INTRAVENOUS | Status: AC
  Filled 2019-11-28: qty 10

## 2019-11-28 MED ORDER — KETOROLAC TROMETHAMINE 15 MG/ML IJ SOLN
15.0000 mg | Freq: Four times a day (QID) | INTRAMUSCULAR | Status: DC
  Administered 2019-11-28 – 2019-11-29 (×3): 15 mg via INTRAVENOUS
  Filled 2019-11-28 (×4): qty 1

## 2019-11-28 MED ORDER — OXYCODONE HCL 5 MG PO TABS
10.0000 mg | ORAL_TABLET | ORAL | Status: DC | PRN
  Administered 2019-11-28 – 2019-12-01 (×16): 10 mg via ORAL
  Filled 2019-11-28 (×16): qty 2

## 2019-11-28 MED ORDER — SODIUM CHLORIDE 0.9 % IV SOLN
250.0000 mL | INTRAVENOUS | Status: DC | PRN
  Administered 2019-11-28 (×2): via INTRAVENOUS

## 2019-11-28 MED ORDER — ROCURONIUM BROMIDE 50 MG/5ML IV SOSY
PREFILLED_SYRINGE | INTRAVENOUS | Status: DC | PRN
  Administered 2019-11-28: 10 mg via INTRAVENOUS
  Administered 2019-11-28: 60 mg via INTRAVENOUS
  Administered 2019-11-28: 10 mg via INTRAVENOUS

## 2019-11-28 MED ORDER — KETAMINE HCL 50 MG/5ML IJ SOSY
PREFILLED_SYRINGE | INTRAMUSCULAR | Status: AC
  Filled 2019-11-28: qty 5

## 2019-11-28 MED ORDER — 0.9 % SODIUM CHLORIDE (POUR BTL) OPTIME
TOPICAL | Status: DC | PRN
  Administered 2019-11-28: 1000 mL

## 2019-11-28 MED ORDER — KETOROLAC TROMETHAMINE 30 MG/ML IJ SOLN
INTRAMUSCULAR | Status: AC
  Filled 2019-11-28: qty 1

## 2019-11-28 MED ORDER — OXYCODONE HCL 5 MG PO TABS: 5.0000 mg | ORAL_TABLET | Freq: Four times a day (QID) | ORAL | Status: DC | PRN

## 2019-11-28 MED ORDER — CLONIDINE HCL 0.1 MG PO TABS
0.1000 mg | ORAL_TABLET | Freq: Once | ORAL | Status: AC
  Administered 2019-11-28: 0.1 mg via ORAL
  Filled 2019-11-28: qty 1

## 2019-11-28 MED ORDER — GLYCOPYRROLATE PF 0.2 MG/ML IJ SOSY
PREFILLED_SYRINGE | INTRAMUSCULAR | Status: AC
  Filled 2019-11-28: qty 1

## 2019-11-28 MED ORDER — VANCOMYCIN HCL 1000 MG IV SOLR
INTRAVENOUS | Status: DC | PRN
  Administered 2019-11-28: 1000 mg via INTRAVENOUS

## 2019-11-28 MED ORDER — VANCOMYCIN HCL 1000 MG IV SOLR
INTRAVENOUS | Status: AC
  Filled 2019-11-28: qty 1000

## 2019-11-28 MED ORDER — DEXAMETHASONE SODIUM PHOSPHATE 10 MG/ML IJ SOLN
INTRAMUSCULAR | Status: DC | PRN
  Administered 2019-11-28: 10 mg via INTRAVENOUS

## 2019-11-28 MED ORDER — ACETAMINOPHEN 325 MG PO TABS
650.0000 mg | ORAL_TABLET | Freq: Four times a day (QID) | ORAL | Status: DC | PRN
  Administered 2019-11-30: 650 mg via ORAL
  Filled 2019-11-28: qty 2

## 2019-11-28 MED ORDER — LIDOCAINE-EPINEPHRINE 0.5 %-1:200000 IJ SOLN
INTRAMUSCULAR | Status: AC
  Filled 2019-11-28: qty 1

## 2019-11-28 MED ORDER — PHENYLEPHRINE 40 MCG/ML (10ML) SYRINGE FOR IV PUSH (FOR BLOOD PRESSURE SUPPORT)
PREFILLED_SYRINGE | INTRAVENOUS | Status: DC | PRN
  Administered 2019-11-28: 80 ug via INTRAVENOUS

## 2019-11-28 MED ORDER — FENTANYL CITRATE (PF) 250 MCG/5ML IJ SOLN
INTRAMUSCULAR | Status: AC
  Filled 2019-11-28: qty 5

## 2019-11-28 MED ORDER — KETOROLAC TROMETHAMINE 30 MG/ML IJ SOLN
30.0000 mg | Freq: Once | INTRAMUSCULAR | Status: AC
  Administered 2019-11-28: 30 mg via INTRAVENOUS

## 2019-11-28 MED ORDER — PHENYLEPHRINE HCL-NACL 10-0.9 MG/250ML-% IV SOLN
INTRAVENOUS | Status: DC | PRN
  Administered 2019-11-28: 40 ug/min via INTRAVENOUS

## 2019-11-28 MED ORDER — OXYCODONE HCL ER 15 MG PO T12A
15.0000 mg | EXTENDED_RELEASE_TABLET | Freq: Two times a day (BID) | ORAL | Status: DC
  Administered 2019-11-28 – 2019-12-01 (×6): 15 mg via ORAL
  Filled 2019-11-28 (×6): qty 1

## 2019-11-28 MED ORDER — BACITRACIN ZINC 500 UNIT/GM EX OINT
TOPICAL_OINTMENT | CUTANEOUS | Status: AC
  Filled 2019-11-28: qty 28.35

## 2019-11-28 MED ORDER — ONDANSETRON HCL 4 MG/2ML IJ SOLN
INTRAMUSCULAR | Status: DC | PRN
  Administered 2019-11-28: 4 mg via INTRAVENOUS

## 2019-11-28 MED ORDER — THROMBIN 5000 UNITS EX SOLR
CUTANEOUS | Status: AC
  Filled 2019-11-28: qty 5000

## 2019-11-28 MED ORDER — SODIUM CHLORIDE 0.9 % IV SOLN: 250.0000 mL | INTRAVENOUS | Status: DC

## 2019-11-28 MED ORDER — MIDAZOLAM HCL 5 MG/5ML IJ SOLN
INTRAMUSCULAR | Status: DC | PRN
  Administered 2019-11-28: 2 mg via INTRAVENOUS

## 2019-11-28 MED ORDER — ROCURONIUM BROMIDE 10 MG/ML (PF) SYRINGE
PREFILLED_SYRINGE | INTRAVENOUS | Status: AC
  Filled 2019-11-28: qty 10

## 2019-11-28 MED ORDER — ACETAMINOPHEN 10 MG/ML IV SOLN
INTRAVENOUS | Status: AC
  Filled 2019-11-28: qty 100

## 2019-11-28 MED ORDER — ONDANSETRON HCL 4 MG/2ML IJ SOLN
INTRAMUSCULAR | Status: AC
  Filled 2019-11-28: qty 2

## 2019-11-28 MED ORDER — TIZANIDINE HCL 4 MG PO TABS
4.0000 mg | ORAL_TABLET | Freq: Four times a day (QID) | ORAL | Status: DC | PRN
  Administered 2019-11-28 – 2019-11-29 (×4): 4 mg via ORAL
  Filled 2019-11-28 (×6): qty 1

## 2019-11-28 MED ORDER — THROMBIN 5000 UNITS EX SOLR
CUTANEOUS | Status: DC | PRN
  Administered 2019-11-28: 10000 [IU] via TOPICAL

## 2019-11-28 MED ORDER — LIDOCAINE 2% (20 MG/ML) 5 ML SYRINGE
INTRAMUSCULAR | Status: DC | PRN
  Administered 2019-11-28: 60 mg via INTRAVENOUS

## 2019-11-28 MED ORDER — ONDANSETRON HCL 4 MG PO TABS: 4.0000 mg | ORAL_TABLET | Freq: Four times a day (QID) | ORAL | Status: DC | PRN

## 2019-11-28 MED ORDER — VANCOMYCIN HCL IN DEXTROSE 1-5 GM/200ML-% IV SOLN
INTRAVENOUS | Status: AC
  Filled 2019-11-28: qty 200

## 2019-11-28 MED ORDER — SUGAMMADEX SODIUM 200 MG/2ML IV SOLN
INTRAVENOUS | Status: DC | PRN
  Administered 2019-11-28: 160 mg via INTRAVENOUS

## 2019-11-28 SURGICAL SUPPLY — 50 items
ADH SKN CLS APL DERMABOND .7 (GAUZE/BANDAGES/DRESSINGS) ×1
APL SKNCLS STERI-STRIP NONHPOA (GAUZE/BANDAGES/DRESSINGS)
BAG DECANTER FOR FLEXI CONT (MISCELLANEOUS) ×2 IMPLANT
BENZOIN TINCTURE PRP APPL 2/3 (GAUZE/BANDAGES/DRESSINGS) IMPLANT
BLADE CLIPPER SURG (BLADE) IMPLANT
BUR MATCHSTICK NEURO 3.0 LAGG (BURR) ×1 IMPLANT
CANISTER SUCT 3000ML PPV (MISCELLANEOUS) ×2 IMPLANT
CARTRIDGE OIL MAESTRO DRILL (MISCELLANEOUS) ×1 IMPLANT
COVER WAND RF STERILE (DRAPES) ×1 IMPLANT
DECANTER SPIKE VIAL GLASS SM (MISCELLANEOUS) ×2 IMPLANT
DERMABOND ADVANCED (GAUZE/BANDAGES/DRESSINGS) ×1
DERMABOND ADVANCED .7 DNX12 (GAUZE/BANDAGES/DRESSINGS) IMPLANT
DIFFUSER DRILL AIR PNEUMATIC (MISCELLANEOUS) ×2 IMPLANT
DRAPE LAPAROTOMY 100X72X124 (DRAPES) ×2 IMPLANT
DRAPE POUCH INSTRU U-SHP 10X18 (DRAPES) ×2 IMPLANT
DRAPE SURG 17X23 STRL (DRAPES) ×2 IMPLANT
DRSG OPSITE POSTOP 4X6 (GAUZE/BANDAGES/DRESSINGS) ×1 IMPLANT
DURAPREP 26ML APPLICATOR (WOUND CARE) ×2 IMPLANT
ELECT REM PT RETURN 9FT ADLT (ELECTROSURGICAL) ×2
ELECTRODE REM PT RTRN 9FT ADLT (ELECTROSURGICAL) ×1 IMPLANT
GAUZE 4X4 16PLY RFD (DISPOSABLE) IMPLANT
GAUZE SPONGE 4X4 12PLY STRL (GAUZE/BANDAGES/DRESSINGS) IMPLANT
GLOVE ECLIPSE 6.5 STRL STRAW (GLOVE) ×2 IMPLANT
GLOVE EXAM NITRILE XL STR (GLOVE) IMPLANT
GOWN STRL REUS W/ TWL LRG LVL3 (GOWN DISPOSABLE) ×2 IMPLANT
GOWN STRL REUS W/ TWL XL LVL3 (GOWN DISPOSABLE) IMPLANT
GOWN STRL REUS W/TWL 2XL LVL3 (GOWN DISPOSABLE) IMPLANT
GOWN STRL REUS W/TWL LRG LVL3 (GOWN DISPOSABLE) ×4
GOWN STRL REUS W/TWL XL LVL3 (GOWN DISPOSABLE)
GRAFT DURAGEN MATRIX 1WX1L (Tissue) ×1 IMPLANT
KIT BASIN OR (CUSTOM PROCEDURE TRAY) ×2 IMPLANT
KIT TURNOVER KIT B (KITS) ×2 IMPLANT
NS IRRIG 1000ML POUR BTL (IV SOLUTION) ×2 IMPLANT
OIL CARTRIDGE MAESTRO DRILL (MISCELLANEOUS) ×2
PACK LAMINECTOMY NEURO (CUSTOM PROCEDURE TRAY) ×2 IMPLANT
PAD ARMBOARD 7.5X6 YLW CONV (MISCELLANEOUS) ×6 IMPLANT
SEALANT ADHERUS EXTEND TIP (MISCELLANEOUS) ×1 IMPLANT
SPONGE LAP 4X18 RFD (DISPOSABLE) IMPLANT
SPONGE SURGIFOAM ABS GEL SZ50 (HEMOSTASIS) ×2 IMPLANT
STRIP CLOSURE SKIN 1/2X4 (GAUZE/BANDAGES/DRESSINGS) IMPLANT
SUT ETHILON 3 0 FSL (SUTURE) ×1 IMPLANT
SUT PROLENE 6 0 BV (SUTURE) ×4 IMPLANT
SUT VIC AB 0 CT1 18XCR BRD8 (SUTURE) ×1 IMPLANT
SUT VIC AB 0 CT1 8-18 (SUTURE) ×2
SUT VIC AB 2-0 CT1 18 (SUTURE) ×2 IMPLANT
SUT VIC AB 3-0 SH 8-18 (SUTURE) ×2 IMPLANT
TOWEL GREEN STERILE (TOWEL DISPOSABLE) ×2 IMPLANT
TOWEL GREEN STERILE FF (TOWEL DISPOSABLE) ×2 IMPLANT
TRAY FOLEY SLVR 16FR LF STAT (SET/KITS/TRAYS/PACK) ×1 IMPLANT
WATER STERILE IRR 1000ML POUR (IV SOLUTION) ×2 IMPLANT

## 2019-11-28 NOTE — Transfer of Care (Signed)
Immediate Anesthesia Transfer of Care Note  Patient: Johnny [name].  Procedure(s) Performed: REPAIR OF CEREBROSPINAL FLUID LEAK/LUMBAR (N/A )  Patient Location: PACU  Anesthesia Type:General  Level of Consciousness: awake and alert   Airway & Oxygen Therapy: Patient Spontanous Breathing and Patient connected to nasal cannula oxygen  Post-op Assessment: Report given to RN and Post -op Vital signs reviewed and stable  Post vital signs: Reviewed and stable  Last Vitals:  Vitals Value Taken Time  BP 146/109 11/28/19 1503  Temp    Pulse 82 11/28/19 1507  Resp 18 11/28/19 1507  SpO2 100 % 11/28/19 1507  Vitals shown include unvalidated device data.  Last Pain:  Vitals:   11/28/19 1315  TempSrc:   PainSc: 10-Worst pain ever         Complications: No apparent anesthesia complications

## 2019-11-28 NOTE — Progress Notes (Signed)
Pt came to [facility] department with dentures.   Floor nurse, [place], called and notified.  Asked nurse to come get patient's dentures.   Nurse stated she would send someone down to get them.

## 2019-11-28 NOTE — Op Note (Signed)
11/28/2019  3:38 PM  PATIENT:  Johnny [name].  48 y.o. male Admitted for a csf leak post lumbar discetomy last week 11/24. PRE-OPERATIVE DIAGNOSIS:  cerebrospinal fluid leak  POST-OPERATIVE DIAGNOSIS:  cerebrospinal fluid leak  PROCEDURE:  Procedure(s): REPAIR OF CEREBROSPINAL FLUID LEAK/LUMBAR  SURGEON: Surgeon(s): [name], MD Newman Pies, MD  ASSISTANTS:Jenkins, Dellis Filbert  ANESTHESIA:   general  EBL:  Total I/O In: 1700 [I.V.:1700] Out: 200 [Urine:150; Blood:50]  BLOOD ADMINISTERED:none  CELL SAVER GIVEN:none  COUNT:per nursing  DRAINS: none   SPECIMEN:  No Specimen  DICTATION: [name] was taken to the operating room, intubated, and placed under a general anesthetic without difficulty. He was positioned prone on a Wilson frame. His back was prepped and draped in a sterile manner. I opened his previous incision using a 10 blade and carried the dissection to the laminectomy site. With microscopic dissection Dr. Arnoldo Morale and I identified the dural opening. We repaired it primarily with prolene sutures. I placed duragen over the closure, and used adherus dural sealant to cover the repair.  I closed the wound in layers approximating the thoracolumbar fascia, and subcutaneous layer with vicryl sutures. I approximated the skin with nylon sutures. I applied a sterile dressing.   PLAN OF CARE: Admit to inpatient   PATIENT DISPOSITION:  PACU - hemodynamically stable.   Delay start of Pharmacological VTE agent (>24hrs) due to surgical blood loss or risk of bleeding:  yes

## 2019-11-28 NOTE — Anesthesia Postprocedure Evaluation (Signed)
Anesthesia Post Note  Patient: Johnny [name].  Procedure(s) Performed: REPAIR OF CEREBROSPINAL FLUID LEAK/LUMBAR (N/A )     Patient location during evaluation: PACU Anesthesia Type: General Level of consciousness: awake and alert Pain management: pain level controlled Vital Signs Assessment: post-procedure vital signs reviewed and stable Respiratory status: spontaneous breathing, nonlabored ventilation, respiratory function stable and patient connected to nasal cannula oxygen Cardiovascular status: blood pressure returned to baseline and stable Postop Assessment: no apparent nausea or vomiting Anesthetic complications: no    Last Vitals:  Vitals:   11/28/19 1547 11/28/19 1602  BP: (!) 122/97 (!) 123/94  Pulse: 66 66  Resp: 12 13  Temp:  36.9 C  SpO2: 95% 94%    Last Pain:  Vitals:   11/28/19 1602  TempSrc:   PainSc: 7                  Ryan P Ellender

## 2019-11-28 NOTE — Progress Notes (Signed)
Pt stated he is ok to take Morphine, although he has an allergy to codeine.

## 2019-11-28 NOTE — H&P (Signed)
Johnny [name]. is a 48 y.o. male With an apparent spinal fluid leak presents for primary repair. He underwent an uncomplicated lumbar discetomy at L4/5 on the right side 11/18/2019. He did well until this past Wednesday when he noticed a headache after standing, and fluid leaking from his wound.  Allergies  Allergen Reactions  . Crab [Shellfish Allergy] Itching and Swelling  . Flexeril [Cyclobenzaprine] Anaphylaxis  . Codeine Itching  . Latex Swelling    Gloves(when worn), condoms  . Penicillins Nausea And Vomiting    Did it involve swelling of the face/tongue/throat, SOB, or low BP? No Did it involve sudden or severe rash/hives, skin peeling, or any reaction on the inside of your mouth or nose? No Did you need to seek medical attention at a hospital or doctor's office? No When did it last happen?10+years If all above answers are "NO", may proceed with cephalosporin use.    . Robaxin [Methocarbamol] Itching  . Sulfamethoxazole-Trimethoprim Nausea Only and Other (See Comments)    Stomach pain   . Tramadol Other (See Comments)   Past Medical History:  Diagnosis Date  . Arthritis    hands, knees, lower back  . Bilateral sciatica   . Carpal tunnel syndrome   . Chronic back pain   . Chronic neck pain   . Chronic pain syndrome   . GERD (gastroesophageal reflux disease)   . Hand joint pain 07/02/2013  . Herniated disc   . Traumatic amputation of left index finger 2013  . Wears dentures    full upper and lower   Past Surgical History:  Procedure Laterality Date  . AMPUTATION FINGER / THUMB Left 2013  . COLONOSCOPY WITH PROPOFOL N/A 01/03/2018   Procedure: COLONOSCOPY WITH PROPOFOL;  Surgeon: [name], MD;  Location: [facility];  Service: Endoscopy;  Laterality: N/A;  . ESOPHAGOGASTRODUODENOSCOPY (EGD) WITH PROPOFOL N/A 01/03/2018   Procedure: ESOPHAGOGASTRODUODENOSCOPY (EGD) WITH PROPOFOL;  Surgeon: [name], MD;  Location: [place];  Service:  Endoscopy;  Laterality: N/A;  . HERNIA REPAIR Right    right inguinal hernia repaired twice  . HIP SURGERY    . JOINT REPLACEMENT Right    knee  . KNEE SURGERY Right   . LUMBAR LAMINECTOMY/DECOMPRESSION MICRODISCECTOMY N/A 11/18/2019   Procedure: LUMBAR FOUR-FIVE LUMBAR LAMINECTOMY/DECOMPRESSION MICRODISCECTOMY;  Surgeon: [name], MD;  Location: [place];  Service: Neurosurgery;  Laterality: N/A;  . neck fusion    . POLYPECTOMY N/A 01/03/2018   Procedure: POLYPECTOMY;  Surgeon: [name], MD;  Location: [place];  Service: Endoscopy;  Laterality: N/A;  . REPLACEMENT TOTAL KNEE Right   . SHOULDER SURGERY Right 2016 X 2  . SPINAL FUSION    . TONSILLECTOMY    . UMBILICAL HERNIA REPAIR N/A 11/07/2019   Procedure: HERNIA REPAIR UMBILICAL ADULT;  Surgeon: [name], MD;  Location: [facility];  Service: General;  Laterality: N/A;   Family History  Problem Relation Age of Onset  . Cancer Father        sarcoma  . Cirrhosis Paternal Grandmother   . Cancer Paternal Grandfather        Pancreatic  . Fibromyalgia Sister   . Deafness Sister    Social History   Socioeconomic History  . Marital status: Divorced    Spouse name: Not on file  . Number of children: Not on file  . Years of education: Not on file  . Highest education level: Not on file  Occupational History  . Not on  file  Social Needs  . Financial resource strain: Not on file  . Food insecurity    Worry: Not on file    Inability: Not on file  . Transportation needs    Medical: Not on file    Non-medical: Not on file  Tobacco Use  . Smoking status: Current Every Day Smoker    Packs/day: 0.50    Years: 25.00    Pack years: 12.50    Types: Cigarettes  . Smokeless tobacco: Never Used  Substance and Sexual Activity  . Alcohol use: No  . Drug use: No  . Sexual activity: Yes  Lifestyle  . Physical activity    Days per week: Not on file    Minutes per session: Not on file  . Stress: Not on file   Relationships  . Social Herbalist on phone: Not on file    Gets together: Not on file    Attends religious service: Not on file    Active member of club or organization: Not on file    Attends meetings of clubs or organizations: Not on file    Relationship status: Not on file  . Intimate partner violence    Fear of current or ex partner: Not on file    Emotionally abused: Not on file    Physically abused: Not on file    Forced sexual activity: Not on file  Other Topics Concern  . Not on file  Social History Narrative  . Not on file   Physical Exam Constitutional:      Appearance: Normal appearance.  HENT:     Head: Normocephalic and atraumatic.     Nose: Nose normal.     Mouth/Throat:     Mouth: Mucous membranes are dry.     Pharynx: Oropharynx is clear.  Eyes:     Extraocular Movements: Extraocular movements intact.     Conjunctiva/sclera: Conjunctivae normal.     Pupils: Pupils are equal, round, and reactive to light.  Neck:     Musculoskeletal: Normal range of motion and neck supple.  Cardiovascular:     Rate and Rhythm: Normal rate and regular rhythm.     Pulses: Normal pulses.     Heart sounds: Normal heart sounds.  Pulmonary:     Effort: Pulmonary effort is normal.     Breath sounds: Normal breath sounds.  Abdominal:     General: Abdomen is flat.     Palpations: Abdomen is soft.  Musculoskeletal: Normal range of motion.  Skin:    Comments: Fluid leaking from lumbar incision  Neurological:     General: No focal deficit present.     Mental Status: He is alert and oriented to person, place, and time.     Cranial Nerves: No cranial nerve deficit.     Sensory: No sensory deficit.     Motor: No weakness.     Coordination: Coordination normal.     Gait: Gait normal.     Deep Tendon Reflexes: Reflexes normal.  Psychiatric:        Mood and Affect: Mood normal.        Behavior: Behavior normal.        Thought Content: Thought content normal.         Judgment: Judgment normal.   OR for csf leak repair

## 2019-11-28 NOTE — Anesthesia Procedure Notes (Signed)
Procedure Name: Intubation Date/Time: 11/28/2019 1:35 PM Performed by: Orlie Dakin, CRNA Pre-anesthesia Checklist: Patient identified, Emergency Drugs available, Suction available and Patient being monitored Patient Re-evaluated:Patient Re-evaluated prior to induction Oxygen Delivery Method: Circle system utilized Preoxygenation: Pre-oxygenation with 100% oxygen Induction Type: IV induction Ventilation: Mask ventilation without difficulty Laryngoscope Size: Miller and 3 Grade View: Grade I Tube type: Oral Tube size: 7.5 mm Number of attempts: 1 Airway Equipment and Method: Stylet Placement Confirmation: ETT inserted through vocal cords under direct vision,  positive ETCO2 and breath sounds checked- equal and bilateral Secured at: 23 cm Tube secured with: Tape Dental Injury: Teeth and Oropharynx as per pre-operative assessment

## 2019-11-28 NOTE — Anesthesia Preprocedure Evaluation (Addendum)
Anesthesia Evaluation  Patient identified by MRN, date of birth, ID band Patient awake    Reviewed: Allergy & Precautions, NPO status , Patient's Chart, lab work & pertinent test results  Airway Mallampati: II  TM Distance: >3 FB Neck ROM: Full    Dental  (+) Edentulous Upper, Edentulous Lower   Pulmonary Current Smoker and Patient abstained from smoking.,    Pulmonary exam normal breath sounds clear to auscultation       Cardiovascular negative cardio ROS Normal cardiovascular exam Rhythm:Regular Rate:Normal  ECG: NSR, rate 75   Neuro/Psych  Neuromuscular disease negative psych ROS   GI/Hepatic negative GI ROS, (+)     substance abuse  ,   Endo/Other  negative endocrine ROS  Renal/GU negative Renal ROS     Musculoskeletal  (+) narcotic dependentChronic neck pain Chronic pain syndrome   Abdominal   Peds  Hematology negative hematology ROS (+)   Anesthesia Other Findings cerebrospinal fluid leak  Reproductive/Obstetrics                            Anesthesia Physical Anesthesia Plan  ASA: II  Anesthesia Plan: General   Post-op Pain Management:    Induction: Intravenous  PONV Risk Score and Plan: 1 and Ondansetron, Dexamethasone, Midazolam and Treatment may vary due to age or medical condition  Airway Management Planned: Oral ETT  Additional Equipment:   Intra-op Plan:   Post-operative Plan: Extubation in OR  Informed Consent: I have reviewed the patients History and Physical, chart, labs and discussed the procedure including the risks, benefits and alternatives for the proposed anesthesia with the patient or authorized representative who has indicated his/her understanding and acceptance.       Plan Discussed with: CRNA  Anesthesia Plan Comments:        Anesthesia Quick Evaluation

## 2019-11-29 MED ORDER — KETOROLAC TROMETHAMINE 15 MG/ML IJ SOLN
15.0000 mg | Freq: Four times a day (QID) | INTRAMUSCULAR | Status: AC
  Administered 2019-11-29 – 2019-12-01 (×7): 15 mg via INTRAVENOUS
  Filled 2019-11-29 (×6): qty 1

## 2019-11-29 MED ORDER — CHLORHEXIDINE GLUCONATE CLOTH 2 % EX PADS
6.0000 | MEDICATED_PAD | Freq: Every day | CUTANEOUS | Status: DC
  Administered 2019-11-29 – 2019-12-01 (×2): 6 via TOPICAL

## 2019-11-29 NOTE — Progress Notes (Signed)
2030: Pt called out stating we are about to hear his bed alarm go off because he is going to the bathroom. RN went to room. Pt states he cannot continue to lie in bed. Advised that he must have complete bedrest. Advised that getting up will impede healing progress. Pt then talking about leaving hospital. Next dose of scheduled pain meds given and refreshments provided. Will continue to monitor.

## 2019-11-29 NOTE — Progress Notes (Signed)
Patient ID: Johnny [name]., male   DOB: 01-Feb-1971, 48 y.o.   MRN: [id number] BP 138/85 (BP Location: Right Arm)   Pulse 96   Temp 98.1 F (36.7 C) (Oral)   Resp 18   Ht 6' (1.829 m)   Wt 87.1 kg   SpO2 99%   BMI 26.04 kg/m  Wound is clean, and dry Normal strength in the lower extremities Receiving adequate pain medication.  Removing the foley catheter, placing a condom catheter.

## 2019-11-30 MED ORDER — CALCIUM CARBONATE ANTACID 500 MG PO CHEW: 1.0000 | CHEWABLE_TABLET | Freq: Two times a day (BID) | ORAL | Status: DC

## 2019-11-30 MED ORDER — CALCIUM CARBONATE ANTACID 500 MG PO CHEW
1.0000 | CHEWABLE_TABLET | Freq: Two times a day (BID) | ORAL | Status: DC | PRN
  Administered 2019-11-30: 200 mg via ORAL
  Filled 2019-11-30: qty 1

## 2019-11-30 NOTE — Progress Notes (Signed)
Subjective: The patient is alert and pleasant.  He denies headache.  He complains of back soreness.  Objective: Vital signs in last 24 hours: Temp:  [97.7 F (36.5 C)-98.3 F (36.8 C)] 97.8 F (36.6 C) (12/06 0758) Pulse Rate:  [79-99] 79 (12/06 0758) Resp:  [15-20] 20 (12/06 0758) BP: (121-152)/(82-102) 152/102 (12/06 0758) SpO2:  [98 %-100 %] 100 % (12/06 0758) Estimated body mass index is 26.04 kg/m as calculated from the following:   Height as of this encounter: 6' (1.829 m).   Weight as of this encounter: 87.1 kg.   Intake/Output from previous day: 12/05 0701 - 12/06 0700 In: 1776.2 [P.O.:840; I.V.:936.2] Out: 4725 [Urine:4725] Intake/Output this shift: No intake/output data recorded.  Physical exam the patient is alert and oriented.  He is moving his lower extremities well.  Lab Results: Recent Labs    11/28/19 1103  WBC 8.6  HGB 16.9  HCT 48.2  PLT 261   BMET Recent Labs    11/28/19 1103  NA 139  K 3.9  CL 104  CO2 22  GLUCOSE 108*  BUN 15  CREATININE 0.98  CALCIUM 9.4    Studies/Results: No results found.  Assessment/Plan: Postop day #2: We will keep the patient at bedrest for 72 hours per Dr. Hewitt Shorts plans.  He will likely go home tomorrow.  LOS: 2 days     Ophelia Charter 11/30/2019, 10:32 AM

## 2019-12-01 ENCOUNTER — Encounter (HOSPITAL_COMMUNITY): Payer: Self-pay | Admitting: Neurosurgery

## 2019-12-01 NOTE — Progress Notes (Signed)
CSW acknowledges consult for SNF placement. Will follow for therapy recommendations.   Thurmond Butts, [name], [place] Social Worker [phone]

## 2019-12-01 NOTE — Discharge Summary (Signed)
Physician Discharge Summary  Patient ID: Johnny [name]. MRN: [id number] DOB/AGE: Oct 14, 1971 48 y.o.  Admit date: 11/28/2019 Discharge date: 12/01/2019  Admission Diagnoses:csf leak post op lumbar laminectomy for disc L4/5 right  Discharge Diagnoses: same Active Problems:   CSF leak   Postoperative CSF leak   Discharged Condition: good  Hospital Course: [name] was admitted and taken to the operating room for a primary repair of a csf leak status post a lumbar discetomy. The wound has remained dry since the repair. He is ambulating, voiding, and tolerating a regular diet.   Treatments: surgery: as above  Discharge Exam: Blood pressure (!) 135/97, pulse 84, temperature 97.8 F (36.6 C), temperature source Oral, resp. rate 20, height 6' (1.829 m), weight 87.1 kg, SpO2 97 %. General appearance: alert, cooperative, appears stated age and mild distress  Disposition: Discharge disposition: 01-Home or Self Care      cerebrospinal fluid leak Discharge Instructions    Incentive spirometry RT   Complete by: As directed      Allergies as of 12/01/2019      Reactions   Crab [shellfish Allergy] Itching, Swelling   Flexeril [cyclobenzaprine] Anaphylaxis   Codeine Itching   Latex Swelling   Gloves(when worn), condoms   Penicillins Nausea And Vomiting   Did it involve swelling of the face/tongue/throat, SOB, or low BP? No Did it involve sudden or severe rash/hives, skin peeling, or any reaction on the inside of your mouth or nose? No Did you need to seek medical attention at a hospital or doctor's office? No When did it last happen?10+years If all above answers are "NO", may proceed with cephalosporin use.   Robaxin [methocarbamol] Itching   Sulfamethoxazole-trimethoprim Nausea Only, Other (See Comments)   Stomach pain    Tramadol Other (See Comments)      Medication List    STOP taking these medications   baclofen 10 MG tablet Commonly known as: LIORESAL    oxyCODONE 5 MG immediate release tablet Commonly known as: Oxy IR/ROXICODONE     TAKE these medications   acetaminophen 500 MG tablet Commonly known as: TYLENOL Take 2 tablets (1,000 mg total) by mouth every 6 (six) hours. Take around the clock for the first 48 hours after surgery, then use every 6 hours as needed for mild to moderate pain.   diphenhydramine-acetaminophen 25-500 MG Tabs tablet Commonly known as: TYLENOL PM Take 2 tablets by mouth at bedtime.   gabapentin 800 MG tablet Commonly known as: NEURONTIN Take 800 mg by mouth 3 (three) times daily. Take with 600 mg tablet to equal 1400 mg 3 times daily   gabapentin 600 MG tablet Commonly known as: NEURONTIN Take 2 tablets (1,200 mg total) by mouth 3 (three) times daily.   GOODSENSE NASAL ALLERGY SPRAY NA Place 1 spray into the nose daily as needed (allergies).   ibuprofen 200 MG tablet Commonly known as: ADVIL Take 600-800 mg by mouth every 6 (six) hours as needed for moderate pain.   tiZANidine 4 MG tablet Commonly known as: ZANAFLEX Take 1 tablet (4 mg total) by mouth every 6 (six) hours as needed for muscle spasms.      Follow-up Information    [name], MD Follow up in 10 day(s).   Specialty: Neurosurgery Why: please call the office for suture removal Contact information: [address] [place] [address] [phone]           Signed: [name] 12/01/2019, 7:07 PM

## 2019-12-01 NOTE — Plan of Care (Signed)
  Problem: Pain Management: Goal: Pain level will decrease Outcome: Progressing Note: Pt continued to complain of lower back pain along with soreness, PRN medications such as Oxycodone, Valium, and Zanaflex have been effective in managing his pain. He just reports, "I just need to get out of this bed," although he is on bedrest until 5pm today. Will continue to monitor pain level and address pain needs as needed.

## 2019-12-01 NOTE — Discharge Instructions (Signed)
You may shower today. If your wound starts to leak or drain please call the office. If your wound becomes very tender or red, again please call the office.

## 2019-12-02 ENCOUNTER — Telehealth: Payer: Self-pay | Admitting: Licensed Clinical Social Worker

## 2019-12-02 ENCOUNTER — Telehealth: Payer: Self-pay | Admitting: Family Medicine

## 2019-12-02 NOTE — Telephone Encounter (Signed)
Copied from [place] [phone]. Topic: General - Call Back - No Documentation >> Dec 02, 2019 12:41 PM Erick Blinks wrote: Pt has called and is requesting a call back from Education officer, museum, [place]. Pt declined to disclose why, states this is important.  Best contact: [phone]

## 2019-12-02 NOTE — Telephone Encounter (Signed)
Called landlord Nelta Numbers to follow up about invoice needed for [facility], he said he will send over in an email 12/02/19 4:07pm

## 2019-12-02 NOTE — Progress Notes (Addendum)
Received discharge order for patient. Discharge instructions reviewed with patient, including new medication and re-educated on wound care. At this time patient have stated understanding of discharge instructions. Pt with questions regarding medications being sent to pharmacy and RN let him know MD is already aware of this. Per MD, pt is not in need of PT/OT orders.  Pt stable in no acute distress. PIV removed, intact. Pt off unit. Nursing care complete.

## 2019-12-02 NOTE — Telephone Encounter (Signed)
° ° °  Returned called from pt regarding [employer] Referral for [employer] for rent payment, we are waiting on landlord pending invoice from Mr. Enriqueta Shutter, told Patient I will follow up from my email to him on Thursday. Inquired about a resource for getting [name] fixed - gasket broke when he was returning from hospital, I told him I did not know of any resources but would follow up with our Social Worker [place] for her call with him next week.  Pt has a court date scheduled for Friday and needs transportation, he had a ticket for driving without insurance. I advised that he call the clerk of court and ask to have a continuance because he just had surgery and does not have transportation available. Mr. Awwad also stated that his landlord may have a car he will loan to him in the interim while his [name] is unusable.  I gave him the phone # for Department of Social Services - transportation so that he can be set up in their system for any future office visits, he will ask them about availability for transportation to get groceries. [place] walking distance to most places.  Will route note to SW so she is aware of our conversation.   [place] [facility] ??[email or web address]   ??[id number]

## 2019-12-03 ENCOUNTER — Ambulatory Visit: Payer: Self-pay | Admitting: *Deleted

## 2019-12-03 DIAGNOSIS — G894 Chronic pain syndrome: Secondary | ICD-10-CM

## 2019-12-03 DIAGNOSIS — Z59819 Housing instability, housed unspecified: Secondary | ICD-10-CM

## 2019-12-03 DIAGNOSIS — G8929 Other chronic pain: Secondary | ICD-10-CM

## 2019-12-03 DIAGNOSIS — Z599 Problem related to housing and economic circumstances, unspecified: Secondary | ICD-10-CM

## 2019-12-03 DIAGNOSIS — M5442 Lumbago with sciatica, left side: Secondary | ICD-10-CM

## 2019-12-03 NOTE — Patient Instructions (Signed)
Thank you allowing the Chronic Care Management Team to be a part of your care! It was a pleasure speaking with you today!  CCM (Chronic Care Management) Team   Azarie Coriz RN, BSN Nurse Care Coordinator  [phone]  [facility] PharmD  Clinical Pharmacist  [phone]  [name] LCSW Clinical Social Worker [phone]  Goals Addressed            This Visit's Progress   . Transportation needs, I have no gas to get to my appts next week (pt-stated)       Current Barriers:  . Film/video editor.  . Transportation barriers  Nurse Case Manager Clinical Goal(s):  Marland Kitchen Over the next 30 days, patient will work with C-3 team and CCM to address needs related to Curator constraints.   Interventions:  . Call received from patient, his [name] has broken down and he now has no transportation.  . Reminded patient of Medicaid transportation for MD appts. . Patient also stating since he has had 2 recent back surgeries he has been unable to get food and now lives too far from the store to walk.  . Placed a call to local food charity and will await a return call. . Patient also stating he is having a hard time obtaining medication ordered by surgeon's office- will alert CCM Pharmacist.   Patient Self Care Activities:  . Currently UNABLE TO independently get to MD appointments related to unreliable income and transportation   Please see past updates related to this goal by clicking on the "Past Updates" button in the selected goal         The patient verbalized understanding of instructions provided today and declined a print copy of patient instruction materials.   The patient has been provided with contact information for the care management team and has been advised to call with any health related questions or concerns.
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
## 2019-12-04 ENCOUNTER — Ambulatory Visit (INDEPENDENT_AMBULATORY_CARE_PROVIDER_SITE_OTHER): Payer: Medicaid Other | Admitting: Family Medicine

## 2019-12-04 ENCOUNTER — Other Ambulatory Visit: Payer: Self-pay

## 2019-12-04 ENCOUNTER — Encounter: Payer: Self-pay | Admitting: Family Medicine

## 2019-12-04 ENCOUNTER — Telehealth: Payer: Self-pay | Admitting: Pharmacist

## 2019-12-04 DIAGNOSIS — Z538 Procedure and treatment not carried out for other reasons: Secondary | ICD-10-CM

## 2019-12-04 MED ORDER — GABAPENTIN 600 MG PO TABS: 1200.0000 mg | ORAL_TABLET | Freq: Three times a day (TID) | ORAL | 6 refills | Status: DC
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
## 2019-12-04 NOTE — Telephone Encounter (Signed)
May not be able to call patient today, as I am not in the office.   Could someone please call patient to clarify? May need to call the pharmacy to clarify his intended dose of gabapentin.     Copied from [place] [id number]. Topic: General - Other >> Dec 04, 2019  1:12 PM Rainey Pines A wrote: Patient requesting callback from nurse to discuss gabapentin medication. Patient stated that gabapentin 600mg  can be filled 12/13 however, the gabapentin 800mg  has been requested 46 days early which has made medication costs over $200. Patient doesn't not have the funds for medication and would like to know what is advised.

## 2019-12-04 NOTE — Progress Notes (Signed)
Does not need to be seen. Appt cancelled.

## 2019-12-05 ENCOUNTER — Telehealth: Payer: Self-pay | Admitting: Family Medicine

## 2019-12-05 ENCOUNTER — Ambulatory Visit: Payer: Self-pay | Admitting: Pharmacist

## 2019-12-05 NOTE — Telephone Encounter (Signed)
    Called pt regarding [employer] Referral for transportation, he stated that he is working to buy a vehicle from his landlord and was wanting to know if there is any program that would help assist with this. I told him that the [facility] would not be a viable option at this point as they are already helping with his rent. Patient may be a good candidate for the [employer] of the [employer] Program to help Mr. Rater with his finances will route this encounter to LCSW for future follow ups with CCM.  Let patient know that I still had not heard back from his landlord regarding the invoice I requested by email and have called directly about. Pt stated that he will follow up with him this afternoon.  Kent . Embedded Care Coordination [place]  Care Management ??[email or web address]  ??[phone]

## 2019-12-05 NOTE — Telephone Encounter (Signed)
Pt would like call back today concerning help with his rent.
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
## 2019-12-06 ENCOUNTER — Other Ambulatory Visit: Payer: Self-pay

## 2019-12-06 ENCOUNTER — Inpatient Hospital Stay (HOSPITAL_COMMUNITY)
Admission: EM | Admit: 2019-12-06 | Discharge: 2019-12-12 | DRG: 858 | Disposition: A | Discharge status: Home / Self Care | Payer: Medicaid Other | Attending: Neurosurgery | Admitting: Neurosurgery

## 2019-12-06 ENCOUNTER — Encounter (HOSPITAL_COMMUNITY)
Admission: EM | Disposition: A | Discharge status: Home / Self Care | Payer: Self-pay | Source: Home / Self Care | Attending: Neurosurgery

## 2019-12-06 ENCOUNTER — Emergency Department (HOSPITAL_COMMUNITY): Payer: Medicaid Other | Admitting: Registered Nurse

## 2019-12-06 ENCOUNTER — Encounter (HOSPITAL_COMMUNITY): Payer: Self-pay | Admitting: Registered Nurse

## 2019-12-06 DIAGNOSIS — G9600 Cerebrospinal fluid leak, unspecified: Secondary | ICD-10-CM

## 2019-12-06 DIAGNOSIS — F17210 Nicotine dependence, cigarettes, uncomplicated: Secondary | ICD-10-CM | POA: Diagnosis present

## 2019-12-06 DIAGNOSIS — G894 Chronic pain syndrome: Secondary | ICD-10-CM | POA: Diagnosis present

## 2019-12-06 DIAGNOSIS — Z20828 Contact with and (suspected) exposure to other viral communicable diseases: Secondary | ICD-10-CM | POA: Diagnosis present

## 2019-12-06 DIAGNOSIS — T8141XA Infection following a procedure, superficial incisional surgical site, initial encounter: Secondary | ICD-10-CM | POA: Diagnosis not present

## 2019-12-06 DIAGNOSIS — Z96651 Presence of right artificial knee joint: Secondary | ICD-10-CM | POA: Diagnosis present

## 2019-12-06 DIAGNOSIS — Z885 Allergy status to narcotic agent status: Secondary | ICD-10-CM | POA: Diagnosis not present

## 2019-12-06 DIAGNOSIS — T8149XA Infection following a procedure, other surgical site, initial encounter: Secondary | ICD-10-CM | POA: Diagnosis present

## 2019-12-06 DIAGNOSIS — Z91013 Allergy to seafood: Secondary | ICD-10-CM | POA: Diagnosis not present

## 2019-12-06 DIAGNOSIS — G9782 Other postprocedural complications and disorders of nervous system: Secondary | ICD-10-CM

## 2019-12-06 DIAGNOSIS — Z91040 Latex allergy status: Secondary | ICD-10-CM

## 2019-12-06 DIAGNOSIS — Z880 Allergy status to penicillin: Secondary | ICD-10-CM | POA: Diagnosis not present

## 2019-12-06 DIAGNOSIS — Z981 Arthrodesis status: Secondary | ICD-10-CM

## 2019-12-06 DIAGNOSIS — K219 Gastro-esophageal reflux disease without esophagitis: Secondary | ICD-10-CM | POA: Diagnosis present

## 2019-12-06 HISTORY — PX: LUMBAR WOUND DEBRIDEMENT: SHX1988

## 2019-12-06 LAB — CBC WITH DIFFERENTIAL/PLATELET
Abs Immature Granulocytes: 0.01 10*3/uL (ref 0.00–0.07)
Basophils Absolute: 0 10*3/uL (ref 0.0–0.1)
Basophils Relative: 1 %
Eosinophils Absolute: 0.2 10*3/uL (ref 0.0–0.5)
Eosinophils Relative: 3 %
HCT: 44.8 % (ref 39.0–52.0)
Hemoglobin: 15.7 g/dL (ref 13.0–17.0)
Immature Granulocytes: 0 %
Lymphocytes Relative: 48 %
Lymphs Abs: 2.7 10*3/uL (ref 0.7–4.0)
MCH: 32.4 pg (ref 26.0–34.0)
MCHC: 35 g/dL (ref 30.0–36.0)
MCV: 92.4 fL (ref 80.0–100.0)
Monocytes Absolute: 0.7 10*3/uL (ref 0.1–1.0)
Monocytes Relative: 13 %
Neutro Abs: 2 10*3/uL (ref 1.7–7.7)
Neutrophils Relative %: 35 %
Platelets: 225 10*3/uL (ref 150–400)
RBC: 4.85 MIL/uL (ref 4.22–5.81)
RDW: 11.9 % (ref 11.5–15.5)
WBC: 5.7 10*3/uL (ref 4.0–10.5)
nRBC: 0 % (ref 0.0–0.2)

## 2019-12-06 LAB — LACTIC ACID, PLASMA
Lactic Acid, Venous: 2 mmol/L (ref 0.5–1.9)
Lactic Acid, Venous: 0.7 mmol/L (ref 0.5–1.9)

## 2019-12-06 LAB — RESPIRATORY PANEL BY RT PCR (FLU A&B, COVID)
Influenza A by PCR: NEGATIVE
Influenza B by PCR: NEGATIVE
SARS Coronavirus 2 by RT PCR: NEGATIVE

## 2019-12-06 LAB — COMPREHENSIVE METABOLIC PANEL
ALT: 20 U/L (ref 0–44)
AST: 17 U/L (ref 15–41)
Albumin: 4 g/dL (ref 3.5–5.0)
Alkaline Phosphatase: 138 U/L — ABNORMAL HIGH (ref 38–126)
Anion gap: 9 (ref 5–15)
BUN: 8 mg/dL (ref 6–20)
CO2: 26 mmol/L (ref 22–32)
Calcium: 9.5 mg/dL (ref 8.9–10.3)
Chloride: 105 mmol/L (ref 98–111)
Creatinine, Ser: 0.91 mg/dL (ref 0.61–1.24)
GFR calc Af Amer: >60 mL/min (ref >60)
GFR calc non Af Amer: >60 mL/min (ref >60)
Glucose, Bld: 104 mg/dL — ABNORMAL HIGH (ref 70–99)
Potassium: 4.3 mmol/L (ref 3.5–5.1)
Sodium: 140 mmol/L (ref 135–145)
Total Bilirubin: 0.6 mg/dL (ref 0.3–1.2)
Total Protein: 7.1 g/dL (ref 6.5–8.1)

## 2019-12-06 SURGERY — LUMBAR WOUND DEBRIDEMENT
Anesthesia: General | Site: Spine Lumbar

## 2019-12-06 MED ORDER — FENTANYL CITRATE (PF) 250 MCG/5ML IJ SOLN
INTRAMUSCULAR | Status: DC | PRN
  Administered 2019-12-06: 100 ug via INTRAVENOUS
  Administered 2019-12-06: 25 ug via INTRAVENOUS
  Administered 2019-12-06: 100 ug via INTRAVENOUS
  Administered 2019-12-06: 25 ug via INTRAVENOUS
  Administered 2019-12-06 (×5): 50 ug via INTRAVENOUS

## 2019-12-06 MED ORDER — HYDROMORPHONE HCL 1 MG/ML IJ SOLN
1.0000 mg | Freq: Once | INTRAMUSCULAR | Status: AC
  Administered 2019-12-06: 1 mg via INTRAVENOUS
  Filled 2019-12-06: qty 1

## 2019-12-06 MED ORDER — EPHEDRINE 5 MG/ML INJ
INTRAVENOUS | Status: AC
  Filled 2019-12-06: qty 10

## 2019-12-06 MED ORDER — 0.9 % SODIUM CHLORIDE (POUR BTL) OPTIME
TOPICAL | Status: DC | PRN
  Administered 2019-12-06: 1000 mL

## 2019-12-06 MED ORDER — THROMBIN 5000 UNITS EX SOLR
CUTANEOUS | Status: AC
  Filled 2019-12-06: qty 5000

## 2019-12-06 MED ORDER — MEPERIDINE HCL 25 MG/ML IJ SOLN: 6.2500 mg | INTRAMUSCULAR | Status: DC | PRN

## 2019-12-06 MED ORDER — SODIUM CHLORIDE 0.9 % IV BOLUS
1000.0000 mL | Freq: Once | INTRAVENOUS | Status: AC
  Administered 2019-12-06: 1000 mL via INTRAVENOUS

## 2019-12-06 MED ORDER — MIDAZOLAM HCL 5 MG/5ML IJ SOLN
INTRAMUSCULAR | Status: DC | PRN
  Administered 2019-12-06: 2 mg via INTRAVENOUS

## 2019-12-06 MED ORDER — PIPERACILLIN-TAZOBACTAM 3.375 G IVPB
3.3750 g | Freq: Three times a day (TID) | INTRAVENOUS | Status: DC
  Administered 2019-12-07 – 2019-12-10 (×12): 3.375 g via INTRAVENOUS
  Filled 2019-12-06 (×12): qty 50

## 2019-12-06 MED ORDER — THROMBIN 5000 UNITS EX SOLR
OROMUCOSAL | Status: DC | PRN
  Administered 2019-12-06: 23:00:00 via TOPICAL

## 2019-12-06 MED ORDER — FENTANYL CITRATE (PF) 250 MCG/5ML IJ SOLN
INTRAMUSCULAR | Status: AC
  Filled 2019-12-06: qty 5

## 2019-12-06 MED ORDER — PHENYLEPHRINE 40 MCG/ML (10ML) SYRINGE FOR IV PUSH (FOR BLOOD PRESSURE SUPPORT)
PREFILLED_SYRINGE | INTRAVENOUS | Status: DC | PRN
  Administered 2019-12-06: 80 ug via INTRAVENOUS

## 2019-12-06 MED ORDER — LIDOCAINE 2% (20 MG/ML) 5 ML SYRINGE
INTRAMUSCULAR | Status: AC
  Filled 2019-12-06: qty 5

## 2019-12-06 MED ORDER — ONDANSETRON HCL 4 MG/2ML IJ SOLN: 4.0000 mg | Freq: Once | INTRAMUSCULAR | Status: DC | PRN

## 2019-12-06 MED ORDER — PROPOFOL 10 MG/ML IV BOLUS
INTRAVENOUS | Status: DC | PRN
  Administered 2019-12-06: 120 mg via INTRAVENOUS
  Administered 2019-12-06: 80 mg via INTRAVENOUS

## 2019-12-06 MED ORDER — HYDROMORPHONE HCL 1 MG/ML IJ SOLN
INTRAMUSCULAR | Status: AC
  Filled 2019-12-06: qty 2

## 2019-12-06 MED ORDER — ARTIFICIAL TEARS OPHTHALMIC OINT
TOPICAL_OINTMENT | OPHTHALMIC | Status: DC | PRN
  Administered 2019-12-06: 1 via OPHTHALMIC

## 2019-12-06 MED ORDER — HYDROMORPHONE HCL 1 MG/ML IJ SOLN
0.2500 mg | INTRAMUSCULAR | Status: DC | PRN
  Administered 2019-12-06 (×3): 0.5 mg via INTRAVENOUS

## 2019-12-06 MED ORDER — SUCCINYLCHOLINE CHLORIDE 200 MG/10ML IV SOSY
PREFILLED_SYRINGE | INTRAVENOUS | Status: AC
  Filled 2019-12-06: qty 10

## 2019-12-06 MED ORDER — SODIUM CHLORIDE 0.9 % IV SOLN
INTRAVENOUS | Status: DC | PRN
  Administered 2019-12-06: 23:00:00

## 2019-12-06 MED ORDER — MIDAZOLAM HCL 2 MG/2ML IJ SOLN
INTRAMUSCULAR | Status: AC
  Filled 2019-12-06: qty 2

## 2019-12-06 MED ORDER — PIPERACILLIN-TAZOBACTAM 3.375 G IVPB: 3.3750 g | Freq: Four times a day (QID) | INTRAVENOUS | Status: DC

## 2019-12-06 MED ORDER — VANCOMYCIN HCL 1000 MG IV SOLR
INTRAVENOUS | Status: DC | PRN
  Administered 2019-12-06: 1000 mg via INTRAVENOUS

## 2019-12-06 MED ORDER — VANCOMYCIN HCL IN DEXTROSE 1-5 GM/200ML-% IV SOLN
INTRAVENOUS | Status: AC
  Filled 2019-12-06: qty 200

## 2019-12-06 MED ORDER — VANCOMYCIN HCL 10 G IV SOLR
1500.0000 mg | Freq: Two times a day (BID) | INTRAVENOUS | Status: DC
  Administered 2019-12-07 – 2019-12-10 (×8): 1500 mg via INTRAVENOUS
  Filled 2019-12-06 (×12): qty 1500

## 2019-12-06 MED ORDER — LIDOCAINE-EPINEPHRINE 1 %-1:100000 IJ SOLN
INTRAMUSCULAR | Status: AC
  Filled 2019-12-06: qty 1

## 2019-12-06 MED ORDER — PROPOFOL 10 MG/ML IV BOLUS
INTRAVENOUS | Status: AC
  Filled 2019-12-06: qty 20

## 2019-12-06 MED ORDER — SODIUM CHLORIDE 0.9% FLUSH
3.0000 mL | Freq: Once | INTRAVENOUS | Status: AC
  Administered 2019-12-06: 15:00:00 3 mL via INTRAVENOUS

## 2019-12-06 MED ORDER — LACTATED RINGERS IV SOLN
INTRAVENOUS | Status: DC | PRN
  Administered 2019-12-06 (×2): via INTRAVENOUS

## 2019-12-06 MED ORDER — ROCURONIUM BROMIDE 10 MG/ML (PF) SYRINGE
PREFILLED_SYRINGE | INTRAVENOUS | Status: AC
  Filled 2019-12-06: qty 10

## 2019-12-06 MED ORDER — LIDOCAINE 2% (20 MG/ML) 5 ML SYRINGE
INTRAMUSCULAR | Status: DC | PRN
  Administered 2019-12-06: 100 mg via INTRAVENOUS

## 2019-12-06 MED ORDER — PHENYLEPHRINE HCL-NACL 10-0.9 MG/250ML-% IV SOLN
INTRAVENOUS | Status: DC | PRN
  Administered 2019-12-06: 25 ug/min via INTRAVENOUS

## 2019-12-06 MED ORDER — ROCURONIUM BROMIDE 10 MG/ML (PF) SYRINGE
PREFILLED_SYRINGE | INTRAVENOUS | Status: DC | PRN
  Administered 2019-12-06: 80 mg via INTRAVENOUS

## 2019-12-06 MED ORDER — ONDANSETRON HCL 4 MG/2ML IJ SOLN
INTRAMUSCULAR | Status: DC | PRN
  Administered 2019-12-06: 4 mg via INTRAVENOUS

## 2019-12-06 MED ORDER — SUGAMMADEX SODIUM 200 MG/2ML IV SOLN
INTRAVENOUS | Status: DC | PRN
  Administered 2019-12-06: 200 mg via INTRAVENOUS

## 2019-12-06 MED ORDER — DEXAMETHASONE SODIUM PHOSPHATE 10 MG/ML IJ SOLN
INTRAMUSCULAR | Status: DC | PRN
  Administered 2019-12-06: 5 mg via INTRAVENOUS

## 2019-12-06 MED ORDER — PHENYLEPHRINE 40 MCG/ML (10ML) SYRINGE FOR IV PUSH (FOR BLOOD PRESSURE SUPPORT)
PREFILLED_SYRINGE | INTRAVENOUS | Status: AC
  Filled 2019-12-06: qty 10

## 2019-12-06 SURGICAL SUPPLY — 32 items
ADH SKN CLS APL DERMABOND .7 (GAUZE/BANDAGES/DRESSINGS) ×1
BAG DECANTER FOR FLEXI CONT (MISCELLANEOUS) ×2 IMPLANT
CANISTER SUCT 3000ML PPV (MISCELLANEOUS) ×2 IMPLANT
DERMABOND ADVANCED (GAUZE/BANDAGES/DRESSINGS) ×1
DERMABOND ADVANCED .7 DNX12 (GAUZE/BANDAGES/DRESSINGS) IMPLANT
DRAPE LAPAROTOMY 100X72X124 (DRAPES) ×2 IMPLANT
DURAPREP 26ML APPLICATOR (WOUND CARE) ×2 IMPLANT
ELECT REM PT RETURN 9FT ADLT (ELECTROSURGICAL) ×2
ELECTRODE REM PT RTRN 9FT ADLT (ELECTROSURGICAL) ×1 IMPLANT
GLOVE BIO SURGEON STRL SZ7.5 (GLOVE) ×2 IMPLANT
GLOVE BIOGEL PI IND STRL 7.5 (GLOVE) ×1 IMPLANT
GLOVE BIOGEL PI INDICATOR 7.5 (GLOVE) ×1
GOWN STRL REUS W/ TWL LRG LVL3 (GOWN DISPOSABLE) ×2 IMPLANT
GOWN STRL REUS W/ TWL XL LVL3 (GOWN DISPOSABLE) IMPLANT
GOWN STRL REUS W/TWL 2XL LVL3 (GOWN DISPOSABLE) IMPLANT
GOWN STRL REUS W/TWL LRG LVL3 (GOWN DISPOSABLE) ×4
GOWN STRL REUS W/TWL XL LVL3 (GOWN DISPOSABLE) ×4
HEMOSTAT POWDER KIT SURGIFOAM (HEMOSTASIS) ×2 IMPLANT
KIT BASIN OR (CUSTOM PROCEDURE TRAY) ×2 IMPLANT
KIT TURNOVER KIT B (KITS) ×2 IMPLANT
NEEDLE HYPO 22GX1.5 SAFETY (NEEDLE) ×1 IMPLANT
NS IRRIG 1000ML POUR BTL (IV SOLUTION) ×2 IMPLANT
PACK LAMINECTOMY NEURO (CUSTOM PROCEDURE TRAY) ×2 IMPLANT
SUT MNCRL AB 3-0 PS2 18 (SUTURE) ×2 IMPLANT
SUT VIC AB 0 CT1 18XCR BRD8 (SUTURE) ×1 IMPLANT
SUT VIC AB 0 CT1 8-18 (SUTURE) ×2
SUT VIC AB 2-0 CT2 18 VCP726D (SUTURE) ×2 IMPLANT
SWAB COLLECTION DEVICE MRSA (MISCELLANEOUS) ×1 IMPLANT
SWAB CULTURE ESWAB REG 1ML (MISCELLANEOUS) ×1 IMPLANT
TOWEL GREEN STERILE (TOWEL DISPOSABLE) ×2 IMPLANT
TOWEL GREEN STERILE FF (TOWEL DISPOSABLE) ×2 IMPLANT
WATER STERILE IRR 1000ML POUR (IV SOLUTION) ×2 IMPLANT

## 2019-12-06 NOTE — Anesthesia Procedure Notes (Signed)
Procedure Name: Intubation Date/Time: 12/06/2019 10:02 PM Performed by: Jearld Pies, CRNA Pre-anesthesia Checklist: Patient identified, Emergency Drugs available, Suction available and Patient being monitored Patient Re-evaluated:Patient Re-evaluated prior to induction Oxygen Delivery Method: Circle System Utilized Preoxygenation: Pre-oxygenation with 100% oxygen Induction Type: IV induction Ventilation: Mask ventilation without difficulty and Oral airway inserted - appropriate to patient size Laryngoscope Size: Sabra Heck and 3 Grade View: Grade I Tube type: Oral Tube size: 7.5 mm Number of attempts: 1 Airway Equipment and Method: Stylet and Oral airway Placement Confirmation: ETT inserted through vocal cords under direct vision,  positive ETCO2 and breath sounds checked- equal and bilateral Secured at: 24 cm Tube secured with: Tape Dental Injury: Teeth and Oropharynx as per pre-operative assessment

## 2019-12-06 NOTE — Anesthesia Preprocedure Evaluation (Signed)
Anesthesia Evaluation  Patient identified by MRN, date of birth, ID band Patient awake    Reviewed: Allergy & Precautions, NPO status , Patient's Chart, lab work & pertinent test results  Airway Mallampati: I  TM Distance: >3 FB Neck ROM: Full    Dental   Pulmonary Current Smoker,    Pulmonary exam normal        Cardiovascular Normal cardiovascular exam     Neuro/Psych    GI/Hepatic GERD  Medicated and Controlled,  Endo/Other    Renal/GU      Musculoskeletal   Abdominal   Peds  Hematology   Anesthesia Other Findings   Reproductive/Obstetrics                             Anesthesia Physical Anesthesia Plan  ASA: II  Anesthesia Plan: General   Post-op Pain Management:    Induction: Intravenous  PONV Risk Score and Plan: 1 and Ondansetron  Airway Management Planned: Oral ETT  Additional Equipment:   Intra-op Plan:   Post-operative Plan: Extubation in OR  Informed Consent: I have reviewed the patients History and Physical, chart, labs and discussed the procedure including the risks, benefits and alternatives for the proposed anesthesia with the patient or authorized representative who has indicated his/her understanding and acceptance.       Plan Discussed with: CRNA and Surgeon  Anesthesia Plan Comments:         Anesthesia Quick Evaluation

## 2019-12-06 NOTE — ED Provider Notes (Signed)
[facility] EMERGENCY DEPARTMENT Provider Note   CSN: [id number] Arrival date & time: 12/06/19  1243     History Chief Complaint  Patient presents with  . Post-op Problem  . Spinal Fluid Leakage    Johnny Bolder Nirvan Muccio. is a 48 y.o. male with history of chronic back pain and 2 recent lumbar surgeries including a postoperative CSF leak who presents with continued low back pain and return of leakage of fluids.  Patient has had a headache that improves with lying down as well.  He denies any fevers.  He has had some night sweats.  He has been taking OxyContin and oxycodone without much relief.  He denies any chest pain, shortness breath, abdominal pain, nausea, vomiting.  Patient reports he spoke with Dr. Zada Finders on-call for Dr. Hewitt Shorts office, who advised he come to the emergency department.  HPI     Past Medical History:  Diagnosis Date  . Arthritis    hands, knees, lower back  . Bilateral sciatica   . Carpal tunnel syndrome   . Chronic back pain   . Chronic neck pain   . Chronic pain syndrome   . GERD (gastroesophageal reflux disease)   . Hand joint pain 07/02/2013  . Herniated disc   . Traumatic amputation of left index finger 2013  . Wears dentures    full upper and lower    Patient Active Problem List   Diagnosis Date Noted  . CSF leak 11/28/2019  . Postoperative CSF leak 11/28/2019  . HNP (herniated nucleus pulposus), lumbar 11/18/2019  . Umbilical hernia without obstruction and without gangrene 10/23/2019  . Osteoarthritis of spine with radiculopathy, lumbar region 10/09/2019  . Chronic bilateral low back pain with bilateral sciatica 10/07/2019  . S/P cervical spinal fusion (C5-C6 ACDF) 07/15/2019  . Cervical radicular pain 07/15/2019  . Cervical facet joint syndrome 07/15/2019  . Degeneration of lumbar intervertebral disc 07/03/2019  . Lumbar radicular pain 07/03/2019  . Cervical spondylosis 04/17/2019  . Homeless single person 04/17/2019   . History of total knee replacement, right 04/25/2018  . Pancreatic mass 01/14/2018  . Tobacco abuse 01/14/2018  . Chronic neck pain   . Benign neoplasm of descending colon   . Benign neoplasm of ascending colon   . Intractable vomiting with nausea   . Reflux esophagitis   . Duodenal ulcer without hemorrhage or perforation   . Chronic pain syndrome 08/07/2017  . Vitamin D deficiency 06/20/2017  . Insomnia 06/19/2017  . Right sided sciatica   . Presence of artificial knee joint 01/17/2013  . Knee pain 01/17/2013  . 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 AMPUTATION FINGER / THUMB Left 2013  . COLONOSCOPY WITH PROPOFOL N/A 01/03/2018   Procedure: COLONOSCOPY WITH PROPOFOL;  Surgeon: [name], MD;  Location: [facility];  Service: Endoscopy;  Laterality: N/A;  . ESOPHAGOGASTRODUODENOSCOPY (EGD) WITH PROPOFOL N/A 01/03/2018   Procedure: ESOPHAGOGASTRODUODENOSCOPY (EGD) WITH PROPOFOL;  Surgeon: [name], MD;  Location: [facility];  Service: Endoscopy;  Laterality: N/A;  . HERNIA REPAIR Right    right inguinal hernia repaired twice  . HIP SURGERY    . JOINT REPLACEMENT Right    knee  . KNEE SURGERY Right   . LUMBAR LAMINECTOMY/DECOMPRESSION MICRODISCECTOMY N/A 11/18/2019   Procedure: LUMBAR FOUR-FIVE LUMBAR LAMINECTOMY/DECOMPRESSION MICRODISCECTOMY;  Surgeon: [name], MD;  Location: [facility];  Service: Neurosurgery;  Laterality:  N/A;  . neck fusion    . POLYPECTOMY N/A 01/03/2018   Procedure: POLYPECTOMY;  Surgeon: [name], MD;  Location: [facility];  Service: Endoscopy;  Laterality: N/A;  . REPAIR OF CEREBROSPINAL FLUID LEAK N/A 11/28/2019   Procedure: REPAIR OF CEREBROSPINAL FLUID LEAK/LUMBAR;  Surgeon: [name], MD;  Location: [facility];  Service: Neurosurgery;  Laterality: N/A;  REPAIR OF CEREBROSPINAL FLUID LEAK/LUMBAR  . REPLACEMENT TOTAL  KNEE Right   . SHOULDER SURGERY Right 2016 X 2  . SPINAL FUSION    . TONSILLECTOMY    . UMBILICAL HERNIA REPAIR N/A 11/07/2019   Procedure: HERNIA REPAIR UMBILICAL ADULT;  Surgeon: [name], MD;  Location: [facility];  Service: General;  Laterality: N/A;       Family History  Problem Relation Age of Onset  . Cancer Father        sarcoma  . Cirrhosis Paternal Grandmother   . Cancer Paternal Grandfather        Pancreatic  . Fibromyalgia Sister   . Deafness Sister     Social History   Tobacco Use  . Smoking status: Current Every Day Smoker    Packs/day: 0.50    Years: 25.00    Pack years: 12.50    Types: Cigarettes  . Smokeless tobacco: Never Used  Substance Use Topics  . Alcohol use: No  . Drug use: No    Home Medications Prior to Admission medications   Medication Sig Start Date End Date Taking? Authorizing Provider  acetaminophen (TYLENOL) 500 MG tablet Take 2 tablets (1,000 mg total) by mouth every 6 (six) hours. Take around the clock for the first 48 hours after surgery, then use every 6 hours as needed for mild to moderate pain. 11/07/19 11/06/20  [name], MD  diphenhydramine-acetaminophen (TYLENOL PM) 25-500 MG TABS tablet Take 2 tablets by mouth at bedtime.    [provider]  gabapentin (NEURONTIN) 600 MG tablet Take 2 tablets (1,200 mg total) by mouth 3 (three) times daily. 12/04/19   Johnson, Megan P, DO  gabapentin (NEURONTIN) 800 MG tablet Take 800 mg by mouth 3 (three) times daily. Take with 600 mg tablet to equal 1400 mg 3 times daily    [provider]  ibuprofen (ADVIL) 200 MG tablet Take 600-800 mg by mouth every 6 (six) hours as needed for moderate pain.    [provider]  oxyCODONE-acetaminophen (PERCOCET) 10-325 MG tablet 1 tablet    [provider]  tiZANidine (ZANAFLEX) 4 MG tablet Take 1 tablet (4 mg total) by mouth every 6 (six) hours as needed for muscle spasms. 11/18/19   [name], MD    Triamcinolone Acetonide (GOODSENSE NASAL ALLERGY SPRAY NA) Place 1 spray into the nose daily as needed (allergies).    [provider]    Allergies    Otho Darner allergy], Flexeril [cyclobenzaprine], Codeine, Latex, Penicillins, Robaxin [methocarbamol], Sulfamethoxazole-trimethoprim, and Tramadol  Review of Systems   Review of Systems  Constitutional: Negative for chills and fever.  HENT: Negative for facial swelling and sore throat.   Respiratory: Negative for shortness of breath.   Cardiovascular: Negative for chest pain.  Gastrointestinal: Negative for abdominal pain, nausea and vomiting.  Genitourinary: Negative for dysuria.  Musculoskeletal: Positive for back pain.  Skin: Positive for wound. Negative for rash.  Neurological: Positive for headaches.  Psychiatric/Behavioral: The patient is not nervous/anxious.     Physical Exam Updated Vital Signs BP (!) 148/105 (BP Location: Right Arm)   Pulse 82  Temp (!) 97.4 F (36.3 C) (Oral)   Resp 16   Ht 6' (1.829 m)   Wt 86.2 kg   SpO2 100%   BMI 25.77 kg/m   Physical Exam Vitals and nursing note reviewed.  Constitutional:      General: He is not in acute distress.    Appearance: He is well-developed. He is not diaphoretic.  HENT:     Head: Normocephalic and atraumatic.     Mouth/Throat:     Pharynx: No oropharyngeal exudate.  Eyes:     General: No scleral icterus.       Right eye: No discharge.        Left eye: No discharge.     Conjunctiva/sclera: Conjunctivae normal.     Pupils: Pupils are equal, round, and reactive to light.  Neck:     Thyroid: No thyromegaly.  Cardiovascular:     Rate and Rhythm: Normal rate and regular rhythm.     Heart sounds: Normal heart sounds. No murmur. No friction rub. No gallop.   Pulmonary:     Effort: Pulmonary effort is normal. No respiratory distress.     Breath sounds: Normal breath sounds. No stridor. No wheezing or rales.  Abdominal:     General: Bowel sounds  are normal. There is no distension.     Palpations: Abdomen is soft.     Tenderness: There is no abdominal tenderness. There is no guarding or rebound.  Musculoskeletal:     Cervical back: Normal range of motion and neck supple.       Back:  Lymphadenopathy:     Cervical: No cervical adenopathy.  Skin:    General: Skin is warm and dry.     Coloration: Skin is not pale.     Findings: No rash.  Neurological:     Mental Status: He is alert.     Coordination: Coordination normal.     Comments: 4/5 strength on the right lower extremity, 5/5 strength on the left; sensation at baseline, reported decreased since cervical disc herniation several years ago     ED Results / Procedures / Treatments   Labs (all labs ordered are listed, but only abnormal results are displayed) Labs Reviewed  LACTIC ACID, PLASMA - Abnormal; Notable for the following components:      Result Value   Lactic Acid, Venous 2.0 (*)    All other components within normal limits  COMPREHENSIVE METABOLIC PANEL - Abnormal; Notable for the following components:   Glucose, Bld 104 (*)    Alkaline Phosphatase 138 (*)    All other components within normal limits  RESPIRATORY PANEL BY RT PCR (FLU A&B, COVID)  CBC WITH DIFFERENTIAL/PLATELET  LACTIC ACID, PLASMA  URINALYSIS, ROUTINE W REFLEX MICROSCOPIC    EKG None  Radiology No results found.  Procedures Procedures (including critical care time)  Medications Ordered in ED Medications  sodium chloride flush (NS) 0.9 % injection 3 mL (3 mLs Intravenous Given 12/06/19 1518)  sodium chloride 0.9 % bolus 1,000 mL (1,000 mLs Intravenous New Bag/Given 12/06/19 1528)  HYDROmorphone (DILAUDID) injection 1 mg (1 mg Intravenous Given 12/06/19 1529)    ED Course  I have reviewed the triage vital signs and the nursing notes.  Pertinent labs & imaging results that were available during my care of the patient were reviewed by me and considered in my medical decision making  (see chart for details).    MDM Rules/Calculators/A&P  Patient presenting with spinal fluid leakage after 2 lumbar surgeries  including a previous CSF leak.  Labs are unremarkable except for mild elevation lactic acid of 2.0.  Will give IV fluids, Dilaudid for pain control.  I discussed patient case with neurosurgeon, Dr. Venetia Constable, who will take the patient to the OR.  COVID-19 testing pending.  I appreciate Dr. Barbra Sarks assistance with the patient.  Final Clinical Impression(s) / ED Diagnoses Final diagnoses:  Postoperative CSF leak    Rx / DC Orders ED Discharge Orders    None       Frederica Kuster, PA-C 12/06/19 1540    Lajean Saver, MD 12/07/19 [phone]

## 2019-12-06 NOTE — ED Triage Notes (Signed)
Pt in w/low back pain and reports spinal fluid leakage at post-op site x 2 days. Hx of lumbar surgery 11/25 and again on 12/4, by Dr. Christella Noa. Denies any fevers, but reports night sweats and HA. Pain 10/10

## 2019-12-06 NOTE — Brief Op Note (Signed)
12/06/2019  11:08 PM  PATIENT:  Johnny [name].  48 y.o. male  PRE-OPERATIVE DIAGNOSIS:  CSF leak  POST-OPERATIVE DIAGNOSIS:  Superficial wound infection  PROCEDURE:  Procedure(s): LUMBAR WOUND REVISION (N/A)  SURGEON:  Surgeon(s) and Role:    * [name], MD - Primary  PHYSICIAN ASSISTANT:   ASSISTANTS: none   ANESTHESIA:   general  EBL:  25 mL   BLOOD ADMINISTERED:none  DRAINS: none   LOCAL MEDICATIONS USED:  NONE  SPECIMEN:  Source of Specimen:  Superficial pus culture  DISPOSITION OF SPECIMEN:  Microbiology  COUNTS:  YES  TOURNIQUET:  * No tourniquets in log *  DICTATION: .Note written in EPIC  PLAN OF CARE: Admit to inpatient   PATIENT DISPOSITION:  PACU - hemodynamically stable.   Delay start of Pharmacological VTE agent (>24hrs) due to surgical blood loss or risk of bleeding: yes

## 2019-12-06 NOTE — Progress Notes (Signed)
Pharmacy Antibiotic Note  Johnny [name]. is a 48 y.o. male admitted on 12/06/2019 with wound infection.  Pharmacy has been consulted for vancomycin dosing.  Plan: Vancomycin 1500 mg IV q12 hours Zosyn 3.375 gm IV q8 F/u renal function, cultures and clinical course  Height: 6' (182.9 cm) Weight: 190 lb (86.2 kg) IBW/kg (Calculated) : 77.6  Temp (24hrs), Avg:97.9 F (36.6 C), Min:97.4 F (36.3 C), Max:98.3 F (36.8 C)  Recent Labs  Lab 12/06/19 1306 12/06/19 1837  WBC 5.7  --   CREATININE 0.91  --   LATICACIDVEN 2.0* 0.7    Estimated Creatinine Clearance: 109 mL/min (by C-G formula based on SCr of 0.91 mg/dL).    Allergies  Allergen Reactions  . Crab [Shellfish Allergy] Itching and Swelling  . Flexeril [Cyclobenzaprine] Anaphylaxis  . Codeine Itching  . Latex Swelling    Gloves(when worn), condoms  . Penicillins Nausea And Vomiting    Did it involve swelling of the face/tongue/throat, SOB, or low BP? No Did it involve sudden or severe rash/hives, skin peeling, or any reaction on the inside of your mouth or nose? No Did you need to seek medical attention at a hospital or doctor's office? No When did it last happen?10+years If all above answers are "NO", may proceed with cephalosporin use.    . Robaxin [Methocarbamol] Itching  . Sulfamethoxazole-Trimethoprim Nausea Only and Other (See Comments)    Stomach pain   . Tramadol Other (See Comments)      Thank you for allowing pharmacy to be a part of this patient's care.  Excell Seltzer Poteet 12/06/2019 11:36 PM

## 2019-12-06 NOTE — Transfer of Care (Signed)
Immediate Anesthesia Transfer of Care Note  Patient: Johnny [name].  Procedure(s) Performed: LUMBAR WOUND REVISION (N/A Spine Lumbar)  Patient Location: PACU  Anesthesia Type:General  Level of Consciousness: awake, alert  and oriented  Airway & Oxygen Therapy: Patient Spontanous Breathing and Patient connected to nasal cannula oxygen  Post-op Assessment: Report given to RN and Post -op Vital signs reviewed and stable  Post vital signs: Reviewed and stable  Last Vitals:  Vitals Value Taken Time  BP 127/72 12/06/19 2314  Temp 98.14F   Pulse 82   Resp 22 12/06/19 2316  SpO2 100   Vitals shown include unvalidated device data.  Last Pain:  Vitals:   12/06/19 1910  TempSrc:   PainSc: 8          Complications: No apparent anesthesia complications

## 2019-12-06 NOTE — H&P (Signed)
Neurosurgery H&P  CC: CSF drainage  HPI: This is a 48 y.o. man that is a patient of Dr. Lacy Duverney that previously had an open lumbar microdiscectomy at L4-5 on 11/18/2019. Post-operatively, he developed delayed CSF drainage and was taken back to the OR on 11/28/2019 for leak repair. He was doing well until yesterday when he had recurrent of his CSF leakage after a bowel movement. No new weakness, numbness, or parasthesias, no recent change in bowel or bladder function but he is developing a progressively worsening headache that is positional. He has been leaking a fairly substantial amount of CSF, with his underwear completely soaked while waiting in the ED. No recent use of anti-platelet or anti-coagulant medications.   ROS: A 14 point ROS was performed and is negative except as noted in the HPI.   PMHx:  Past Medical History:  Diagnosis Date  . Arthritis    hands, knees, lower back  . Bilateral sciatica   . Carpal tunnel syndrome   . Chronic back pain   . Chronic neck pain   . Chronic pain syndrome   . GERD (gastroesophageal reflux disease)   . Hand joint pain 07/02/2013  . Herniated disc   . Traumatic amputation of left index finger 2013  . Wears dentures    full upper and lower   FamHx:  Family History  Problem Relation Age of Onset  . Cancer Father        sarcoma  . Cirrhosis Paternal Grandmother   . Cancer Paternal Grandfather        Pancreatic  . Fibromyalgia Sister   . Deafness Sister    SocHx:  reports that he has been smoking cigarettes. He has a 12.50 pack-year smoking history. He has never used smokeless tobacco. He reports that he does not drink alcohol or use drugs.  Exam: Vital signs in last 24 hours: Temp:  [97.4 F (36.3 C)] 97.4 F (36.3 C) (12/12 1248) Pulse Rate:  [82-96] 82 (12/12 1517) Resp:  [16-20] 16 (12/12 1517) BP: (148-153)/(105-111) 148/105 (12/12 1517) SpO2:  [99 %-100 %] 100 % (12/12 1517) Weight:  [86.2 kg-87.1 kg] 86.2 kg (12/12  1533) General: Awake, alert, cooperative, lying in bed in NAD Head: normocephalic and atruamatic HEENT: neck supple Pulmonary: breathing room air comfortably, no evidence of increased work of breathing Cardiac: RRR Abdomen: S NT ND Extremities: warm and well perfused x4 Neuro: AOx3, PERRL, EOMI, FS MAEx4 without preference Incision with some clear drainage, vertical mattress nylons in place with good closure, no dehiscence.   Assessment and Plan: 48 y.o. man s/p L4-5 open microdisc w/ post-op CSF leak s/p repair, now with repeat CSF leak.  -OR today for repeat CSF leak repair and wound revision with placement of lumbar drain -will need a bed on 4N post-op due to CSF drain -COVID preop test pending  [name], MD 12/06/19 3:53 PM [facility]

## 2019-12-06 NOTE — Progress Notes (Signed)
Intraoperative findings c/w wound infection, no lumbar drain needed, can be admitted to floor bed, started vanc/zosyn, cultures pending.

## 2019-12-06 NOTE — Op Note (Signed)
PATIENT: Johnny [name].  DAY OF SURGERY: 12/06/19   PRE-OPERATIVE DIAGNOSIS:  Cerebrospinal fluid leak with cutaneous fistula   POST-OPERATIVE DIAGNOSIS:  Superficial post-operative lumbar wound infection   PROCEDURE:  Lumbar wound revision   SURGEON:  Surgeon(s) and Role:    [name], MD - Primary   ANESTHESIA: ETGA   BRIEF HISTORY: This is a 48 year old man who presented with CSF drainage after a prior L4-5 microdiscectomy. He previously had a repair that held for one week, but he began leaking again. Given his second leak, I therefore recommended placement of a lumbar drain as well as repeat repair of the leak site. This was discussed with the patient as well as risks, benefits, and alternatives and wished to proceed with surgery.   OPERATIVE DETAIL: The patient was taken to the operating room and placed on the OR table in the prone position. A formal time out was performed with two patient identifiers and confirmed the operative site. Anesthesia was induced by the anesthesia team. The operative site was marked, hair was clipped with surgical clippers, the area was then prepped and draped in a sterile fashion.   Upon opening the incision, purulent material was clearly present. Antibiotics had been held and cultures were taken x2. The fascia was intact and no drainage was noted from the wound after irrigating the suprafascial area. I had anesthesia perform a Valsalva to 48mmHg for a prolonged period with no evidence of CSF leak. I therefore copiously irrigated the wound and closed the wound in layers.   All instrument and sponge counts were correct. The patient was then returned to anesthesia for emergence. No apparent complications at the completion of the procedure.   EBL:  63mL   DRAINS: None   SPECIMENS: Superficial lumbar wound cultures x2   [name], MD 12/06/19 9:16 PM

## 2019-12-07 MED ORDER — ONDANSETRON HCL 4 MG PO TABS: 4.0000 mg | ORAL_TABLET | Freq: Four times a day (QID) | ORAL | Status: DC | PRN

## 2019-12-07 MED ORDER — PHENOL 1.4 % MT LIQD: 1.0000 | OROMUCOSAL | Status: DC | PRN

## 2019-12-07 MED ORDER — SODIUM CHLORIDE 0.9 % IV SOLN: 250.0000 mL | INTRAVENOUS | Status: DC

## 2019-12-07 MED ORDER — TIZANIDINE HCL 2 MG PO TABS
4.0000 mg | ORAL_TABLET | Freq: Four times a day (QID) | ORAL | Status: DC | PRN
  Administered 2019-12-07 – 2019-12-09 (×8): 4 mg via ORAL
  Filled 2019-12-07 (×2): qty 1
  Filled 2019-12-07: qty 2
  Filled 2019-12-07 (×4): qty 1
  Filled 2019-12-07 (×2): qty 2
  Filled 2019-12-07: qty 1

## 2019-12-07 MED ORDER — OXYCODONE HCL 5 MG PO TABS
10.0000 mg | ORAL_TABLET | ORAL | Status: DC | PRN
  Administered 2019-12-07 – 2019-12-09 (×3): 10 mg via ORAL
  Filled 2019-12-07 (×4): qty 2

## 2019-12-07 MED ORDER — CHLORHEXIDINE GLUCONATE CLOTH 2 % EX PADS
6.0000 | MEDICATED_PAD | Freq: Every day | CUTANEOUS | Status: DC
  Administered 2019-12-08 – 2019-12-12 (×3): 6 via TOPICAL

## 2019-12-07 MED ORDER — ACETAMINOPHEN 650 MG RE SUPP: 650.0000 mg | RECTAL | Status: DC | PRN

## 2019-12-07 MED ORDER — GABAPENTIN 300 MG PO CAPS
1200.0000 mg | ORAL_CAPSULE | Freq: Three times a day (TID) | ORAL | Status: DC
  Administered 2019-12-07 (×2): 1200 mg via ORAL
  Filled 2019-12-07 (×3): qty 4

## 2019-12-07 MED ORDER — OXYCODONE HCL ER 15 MG PO T12A
15.0000 mg | EXTENDED_RELEASE_TABLET | Freq: Two times a day (BID) | ORAL | Status: DC
  Administered 2019-12-07 – 2019-12-12 (×12): 15 mg via ORAL
  Filled 2019-12-07 (×12): qty 1

## 2019-12-07 MED ORDER — OXYCODONE HCL 5 MG PO TABS
15.0000 mg | ORAL_TABLET | ORAL | Status: DC | PRN
  Administered 2019-12-07 – 2019-12-12 (×26): 15 mg via ORAL
  Filled 2019-12-07 (×26): qty 3

## 2019-12-07 MED ORDER — SODIUM CHLORIDE 0.9% FLUSH: 3.0000 mL | INTRAVENOUS | Status: DC | PRN

## 2019-12-07 MED ORDER — MENTHOL 3 MG MT LOZG: 1.0000 | LOZENGE | OROMUCOSAL | Status: DC | PRN

## 2019-12-07 MED ORDER — GABAPENTIN 600 MG PO TABS
1200.0000 mg | ORAL_TABLET | Freq: Three times a day (TID) | ORAL | Status: DC
  Administered 2019-12-07 – 2019-12-12 (×14): 1200 mg via ORAL
  Filled 2019-12-07 (×16): qty 2

## 2019-12-07 MED ORDER — HYDROMORPHONE HCL 1 MG/ML IJ SOLN
1.0000 mg | INTRAMUSCULAR | Status: DC | PRN
  Administered 2019-12-07 – 2019-12-09 (×13): 1 mg via INTRAVENOUS
  Filled 2019-12-07 (×14): qty 1

## 2019-12-07 MED ORDER — POLYETHYLENE GLYCOL 3350 17 G PO PACK
17.0000 g | PACK | Freq: Every day | ORAL | Status: DC | PRN
  Administered 2019-12-08: 17 g via ORAL
  Filled 2019-12-07: qty 1

## 2019-12-07 MED ORDER — DOCUSATE SODIUM 100 MG PO CAPS
100.0000 mg | ORAL_CAPSULE | Freq: Two times a day (BID) | ORAL | Status: DC
  Administered 2019-12-07 – 2019-12-09 (×5): 100 mg via ORAL
  Filled 2019-12-07 (×7): qty 1

## 2019-12-07 MED ORDER — ONDANSETRON HCL 4 MG/2ML IJ SOLN: 4.0000 mg | Freq: Four times a day (QID) | INTRAMUSCULAR | Status: DC | PRN

## 2019-12-07 MED ORDER — SODIUM CHLORIDE 0.9% FLUSH
3.0000 mL | Freq: Two times a day (BID) | INTRAVENOUS | Status: DC
  Administered 2019-12-07 – 2019-12-11 (×6): 3 mL via INTRAVENOUS

## 2019-12-07 MED ORDER — ACETAMINOPHEN 325 MG PO TABS: 650.0000 mg | ORAL_TABLET | ORAL | Status: DC | PRN

## 2019-12-07 NOTE — Progress Notes (Signed)
Neurosurgery Service Progress Note  Subjective: No acute events overnight, back pain improving from immediate post-op, no wound drainage   Objective: Vitals:   12/07/19 0200 12/07/19 0300 12/07/19 0400 12/07/19 0800  BP:   103/68 121/77  Pulse: 96 82 77 87  Resp: 16 11 20 14   Temp:    97.7 F (36.5 C)  TempSrc:    Oral  SpO2: 99% 96% 100% 98%  Weight: 87.3 kg     Height: 6' (1.829 m)      Temp (24hrs), Avg:98.1 F (36.7 C), Min:97.4 F (36.3 C), Max:98.7 F (37.1 C)  CBC Latest Ref Rng & Units 12/06/2019 11/28/2019 11/22/2019  WBC 4.0 - 10.5 K/uL 5.7 8.6 8.0  Hemoglobin 13.0 - 17.0 g/dL 15.7 16.9 15.1  Hematocrit 39.0 - 52.0 % 44.8 48.2 41.8  Platelets 150 - 400 K/uL 225 261 253   BMP Latest Ref Rng & Units 12/06/2019 11/28/2019 11/22/2019  Glucose 70 - 99 mg/dL 104(H) 108(H) 110(H)  BUN 6 - 20 mg/dL 8 15 11   Creatinine 0.61 - 1.24 mg/dL 0.91 0.98 0.83  BUN/Creat Ratio 9 - 20 - - -  Sodium 135 - 145 mmol/L 140 139 139  Potassium 3.5 - 5.1 mmol/L 4.3 3.9 3.9  Chloride 98 - 111 mmol/L 105 104 104  CO2 22 - 32 mmol/L 26 22 27   Calcium 8.9 - 10.3 mg/dL 9.5 9.4 8.8(L)    Intake/Output Summary (Last 24 hours) at 12/07/2019 0926 Last data filed at 12/07/2019 0820 Gross per 24 hour  Intake 2440.03 ml  Output 25 ml  Net 2415.03 ml    Current Facility-Administered Medications:  .  0.9 %  sodium chloride infusion, 250 mL, Intravenous, Continuous, Savas Elvin A, MD .  acetaminophen (TYLENOL) tablet 650 mg, 650 mg, Oral, Q4H PRN **OR** acetaminophen (TYLENOL) suppository 650 mg, 650 mg, Rectal, Q4H PRN, Zechariah Bissonnette, Joyice Faster, MD .  Chlorhexidine Gluconate Cloth 2 % PADS 6 each, 6 each, Topical, Daily, Kylah Maresh A, MD .  docusate sodium (COLACE) capsule 100 mg, 100 mg, Oral, BID, [name], MD, 100 mg at 12/07/19 0054 .  gabapentin (NEURONTIN) capsule 1,200 mg, 1,200 mg, Oral, TID, Mikaeel Petrow A, MD .  HYDROmorphone (DILAUDID) 1 MG/ML injection, , ,  ,  .  HYDROmorphone (DILAUDID) injection 1 mg, 1 mg, Intravenous, Q3H PRN, [name], MD, 1 mg at 12/07/19 0530 .  menthol-cetylpyridinium (CEPACOL) lozenge 3 mg, 1 lozenge, Oral, PRN **OR** phenol (CHLORASEPTIC) mouth spray 1 spray, 1 spray, Mouth/Throat, PRN, Marisol Giambra A, MD .  ondansetron (ZOFRAN) tablet 4 mg, 4 mg, Oral, Q6H PRN **OR** ondansetron (ZOFRAN) injection 4 mg, 4 mg, Intravenous, Q6H PRN, [name], MD .  oxyCODONE (Oxy IR/ROXICODONE) immediate release tablet 10 mg, 10 mg, Oral, Q4H PRN, [name], MD .  oxyCODONE (Oxy IR/ROXICODONE) immediate release tablet 15 mg, 15 mg, Oral, Q4H PRN, [name], MD, 15 mg at 12/07/19 0735 .  oxyCODONE (OXYCONTIN) 12 hr tablet 15 mg, 15 mg, Oral, Q12H, Chayanne Speir, Joyice Faster, MD, 15 mg at 12/07/19 0130 .  piperacillin-tazobactam (ZOSYN) IVPB 3.375 g, 3.375 g, Intravenous, Q8H, Kateria Cutrona A, MD, Last Rate: 12.5 mL/hr at 12/07/19 0817, 3.375 g at 12/07/19 0817 .  polyethylene glycol (MIRALAX / GLYCOLAX) packet 17 g, 17 g, Oral, Daily PRN, Danelly Hassinger A, MD .  sodium chloride flush (NS) 0.9 % injection 3 mL, 3 mL, Intravenous, Q12H, Joelys Staubs, Joyice Faster, MD, 3 mL at 12/07/19 0057 .  sodium chloride flush (NS) 0.9 % injection 3 mL, 3 mL, Intravenous, PRN, Randeep Biondolillo A, MD .  tiZANidine (ZANAFLEX) tablet 4 mg, 4 mg, Oral, Q6H PRN, [name], MD, 4 mg at 12/07/19 0735 .  vancomycin (VANCOCIN) 1,500 mg in sodium chloride 0.9 % 500 mL IVPB, 1,500 mg, Intravenous, Q12H, [name], MD, Stopped at 12/07/19 [id number]   Physical Exam: Strength 5/5x4, SILTx4, incision c/d/i  Assessment & Plan: 48 y.o. man w/ prior microdisc w/ post-op CSF leak s/p repair, now w/ recurrent drainage from wound. Intra-op appearance of fluid was thick, grossly purulent.   -on V/Z, pharmacy following to adjust dosing -cultures pending, gram stain neg -transfer to floor bed, can be floor status before  he transfers -[place]  12/07/19 9:26 AM

## 2019-12-07 NOTE — Anesthesia Postprocedure Evaluation (Signed)
Anesthesia Post Note  Patient: Johnny [name].  Procedure(s) Performed: LUMBAR WOUND REVISION (N/A Spine Lumbar)     Patient location during evaluation: PACU Anesthesia Type: General Level of consciousness: awake and alert Pain management: pain level controlled Vital Signs Assessment: post-procedure vital signs reviewed and stable Respiratory status: spontaneous breathing, nonlabored ventilation, respiratory function stable and patient connected to nasal cannula oxygen Cardiovascular status: blood pressure returned to baseline and stable Postop Assessment: no apparent nausea or vomiting Anesthetic complications: no    Last Vitals:  Vitals:   12/06/19 2345 12/07/19 0000  BP: 106/85 115/79  Pulse: 68 70  Resp: 12 11  Temp:  36.8 C  SpO2: 96% 96%    Last Pain:  Vitals:   12/07/19 0000  TempSrc:   PainSc: Asleep    LLE Motor Response: Purposeful movement (12/07/19 0000) LLE Sensation: Full sensation (12/07/19 0000) RLE Motor Response: Purposeful movement (12/07/19 0000) RLE Sensation: Full sensation (12/07/19 0000)      Kiran Lapine DAVID

## 2019-12-08 NOTE — Progress Notes (Signed)
Patient ID: Johnny [name]., male   DOB: [birth year], 48 y.o.   MRN: [id number] BP (!) 152/108 (BP Location: Left Arm)   Pulse 87   Temp 98.5 F (36.9 C) (Oral)   Resp 18   Ht 6' (1.829 m)   Wt 87.3 kg   SpO2 99%   BMI 26.10 kg/m  Alert and oriented x 4 Moving all extremities Wound is clean, dry, no purulence Continue abx

## 2019-12-09 LAB — BASIC METABOLIC PANEL
Anion gap: 8 (ref 5–15)
BUN: <5 mg/dL — ABNORMAL LOW (ref 6–20)
CO2: 29 mmol/L (ref 22–32)
Calcium: 8.6 mg/dL — ABNORMAL LOW (ref 8.9–10.3)
Chloride: 103 mmol/L (ref 98–111)
Creatinine, Ser: 0.98 mg/dL (ref 0.61–1.24)
GFR calc Af Amer: >60 mL/min (ref >60)
GFR calc non Af Amer: >60 mL/min (ref >60)
Glucose, Bld: 100 mg/dL — ABNORMAL HIGH (ref 70–99)
Potassium: 4.1 mmol/L (ref 3.5–5.1)
Sodium: 140 mmol/L (ref 135–145)

## 2019-12-09 MED ORDER — DIAZEPAM 5 MG PO TABS
5.0000 mg | ORAL_TABLET | Freq: Four times a day (QID) | ORAL | Status: DC | PRN
  Administered 2019-12-09 – 2019-12-12 (×11): 5 mg via ORAL
  Filled 2019-12-09 (×11): qty 1

## 2019-12-09 NOTE — Progress Notes (Signed)
Patient ID: Johnny [name]., male   DOB: 02-23-1971, 48 y.o.   MRN: [id number] BP (!) 139/95 (BP Location: Left Arm)   Pulse 79   Temp 98.6 F (37 C) (Oral)   Resp 18   Ht 6' (1.829 m)   Wt 87.3 kg   SpO2 98%   BMI 26.10 kg/m  Alert and oriented Wound is clean, dry, no signs of infection Will start valium,  Complains of pain in right buttocks radiating into thigh

## 2019-12-09 NOTE — Progress Notes (Addendum)
Pharmacy Antibiotic Note  Johnny [name]. is a 48 y.o. male admitted on 12/06/2019 with wound infection.  Pharmacy consulted on 12/06/19  for vancomycin dosing for lumbar wound infection .  Continues on day # 3 Vancomycin and Zosyn. Afebrile, WBC WNL on 12/12, Scr 0.91>0.98  12/12 lumbar wound culture growing few pantoea species : pan sensitive to cefazolin.  Panteoa species (an enterbacteriaceae species). I will  discuss with ID pharmacist, Jimmy Footman, possibly narrow to cefazolin or bactrim.  Awaiting further recommendations.  Plan:  Vancomycin 1500 mg IV q12 hours and Zosyn 3.375 gm IV q8h Possibly narrow abx to cefazolin or bactrim? Need to f/u on this. F/u renal function, cultures and clinical course Addendum:   ID pharmacist, Jimmy Footman Pharm D/ Terri Piedra, NP recommends oral  Bactrim DS for 14 days post debridement.   Height: 6' (182.9 cm) Weight: 192 lb 7.4 oz (87.3 kg) IBW/kg (Calculated) : 77.6  Temp (24hrs), Avg:98.6 F (37 C), Min:98.4 F (36.9 C), Max:99 F (37.2 C)  Recent Labs  Lab 12/06/19 1306 12/06/19 1837 12/09/19 0345  WBC 5.7  --   --   CREATININE 0.91  --  0.98  LATICACIDVEN 2.0* 0.7  --     Estimated Creatinine Clearance: 101.2 mL/min (by C-G formula based on SCr of 0.98 mg/dL).    Allergies  Allergen Reactions  . Crab [Shellfish Allergy] Itching and Swelling  . Flexeril [Cyclobenzaprine] Anaphylaxis  . Codeine Itching  . Latex Swelling    Gloves(when worn), condoms  . Penicillins Nausea And Vomiting    Did it involve swelling of the face/tongue/throat, SOB, or low BP? No Did it involve sudden or severe rash/hives, skin peeling, or any reaction on the inside of your mouth or nose? No Did you need to seek medical attention at a hospital or doctor's office? No When did it last happen?10+years If all above answers are "NO", may proceed with cephalosporin use.    . Robaxin [Methocarbamol] Itching  .  Sulfamethoxazole-Trimethoprim Nausea Only and Other (See Comments)    Stomach pain   . Tramadol Other (See Comments)    Thank you for allowing pharmacy to be a part of this patient's care.  Nicole Cella, RPh Clinical Pharmacist [phone]  Please check AMION for all [place] phone numbers After 10:00 PM, call [place] [phone] 12/09/2019 11:19 AM   Addendum:   ID pharmacist, Jimmy Footman Pharm D/ Terri Piedra, NP recommends oral  Bactrim DS for 14 days post debridement.  Can take with food to decrease potential nausea, stomach pain (patient's allergy list includes Septra causing stomach pain/nause).  Alternative would be a quinolone.   Nicole Cella, RPh Clinical Pharmacist 12/09/2019 11:48 AM

## 2019-12-10 MED ORDER — DEXAMETHASONE 4 MG PO TABS
4.0000 mg | ORAL_TABLET | Freq: Two times a day (BID) | ORAL | Status: DC
  Administered 2019-12-10 – 2019-12-12 (×4): 4 mg via ORAL
  Filled 2019-12-10 (×4): qty 1

## 2019-12-10 MED ORDER — SULFAMETHOXAZOLE-TRIMETHOPRIM 400-80 MG PO TABS
1.0000 | ORAL_TABLET | Freq: Two times a day (BID) | ORAL | Status: DC
  Administered 2019-12-10 – 2019-12-12 (×4): 1 via ORAL
  Filled 2019-12-10 (×5): qty 1

## 2019-12-10 MED ORDER — DIPHENHYDRAMINE HCL 25 MG PO CAPS
25.0000 mg | ORAL_CAPSULE | Freq: Four times a day (QID) | ORAL | Status: DC | PRN
  Administered 2019-12-10 (×2): 25 mg via ORAL
  Filled 2019-12-10 (×3): qty 1

## 2019-12-10 NOTE — Plan of Care (Signed)

## 2019-12-10 NOTE — Progress Notes (Signed)
BP 138/90 (BP Location: Left Arm)   Pulse 80   Temp 97.8 F (36.6 C) (Axillary)   Resp 17   Ht 6' (1.829 m)   Wt 87.3 kg   SpO2 99%   BMI 26.10 kg/m  Alert and oriented x 4, speech is clear and fluent Will narrow abx to bactrim po Dc stool, meds, Johnny [name] complaining of loose stools at this time Wound is clean, dry, flat Start steroids

## 2019-12-10 NOTE — Progress Notes (Signed)
Patient requesting something for heartburn. No standing orders at this time. Reached out to neurology on call and relayed request

## 2019-12-11 NOTE — Progress Notes (Signed)
Patient ID: [name], male   DOB: Nov 17, 1971, 48 y.o.   MRN: [id number] BP (!) 146/97 (BP Location: Left Arm)   Pulse 83   Temp 97.9 F (36.6 C) (Oral)   Resp 14   Ht 6' (1.829 m)   Wt 87.3 kg   SpO2 98%   BMI 26.10 kg/m  Alert and oriented x 4 Moving all extremities well May leave his room to walk Probable discharge soon

## 2019-12-11 NOTE — Plan of Care (Signed)

## 2019-12-11 NOTE — Plan of Care (Signed)
  Problem: Activity: Goal: Will remain free from falls Outcome: Progressing

## 2019-12-12 ENCOUNTER — Ambulatory Visit: Payer: Medicaid Other | Admitting: Licensed Clinical Social Worker

## 2019-12-12 LAB — AEROBIC/ANAEROBIC CULTURE W GRAM STAIN (SURGICAL/DEEP WOUND)

## 2019-12-12 MED ORDER — SULFAMETHOXAZOLE-TRIMETHOPRIM 400-80 MG PO TABS: 1.0000 | ORAL_TABLET | Freq: Two times a day (BID) | ORAL | 0 refills | Status: DC

## 2019-12-12 NOTE — Plan of Care (Signed)
Pt for discharge today alert and oriented, no IV line, ambulatory, he can tolerate his meal, given health teachings, next appointment, due med explained and understood, given all his personal belongings, waiting for the family to pick him up.

## 2019-12-12 NOTE — Progress Notes (Signed)
Dr. Christella Noa , Johnny [name] came and I heard arguments between the patient and MD, charge nurse aware and MD said no need for security, they are discussing about the pain med, pt for discharge today.

## 2019-12-12 NOTE — Social Work (Signed)
CSW spoke with pt at bedside, provided him with transportation waiver which he signed for [employer]. Ride scheduled for 4:45pm. Pt inquiring as to if he can use these services to get to PCP. CSW will contact pt [facility] CSW and see if this can be arranged.   CSW signing off. Please consult if any additional needs arise.  Alexander Mt, [place] Work [phone]

## 2019-12-12 NOTE — Discharge Summary (Signed)
BP (!) 141/93 (BP Location: Right Arm)   Pulse 85   Temp 98.2 F (36.8 C) (Oral)   Resp 18   Ht 6' (1.829 m)   Wt 87.3 kg   SpO2 100%   BMI 26.10 kg/m  Physician Discharge Summary  Patient ID: Johnny [name]. MRN: [id number] DOB/AGE: July 04, 1971 48 y.o.  Admit date: 12/06/2019 Discharge date: 12/12/2019  Admission Diagnoses:wound infection after lumbar laminectomy  Discharge Diagnoses:  Active Problems:   CSF leak   Wound infection after surgery   Discharged Condition: good  Hospital Course: [name] was admitted for a presumed recurrent csf leak, when instead he had a wound infection. He was taken to the operating room and the wound was debrided. The organism was identified as pantoea sensitive to bactrim. He will be discharged on bactrim bid by 30days.   Treatments: surgery: wound debridement  Discharge Exam: Blood pressure (!) 141/93, pulse 85, temperature 98.2 F (36.8 C), temperature source Oral, resp. rate 18, height 6' (1.829 m), weight 87.3 kg, SpO2 100 %. General appearance: alert, cooperative and mild distress Neurologic: Alert and oriented X 3, normal strength and tone. Normal symmetric reflexes. Normal coordination and gait  Disposition: Discharge disposition: 01-Home or Self Care      CSF leak  Allergies as of 12/12/2019      Reactions   Crab [shellfish Allergy] Itching, Swelling   Flexeril [cyclobenzaprine] Anaphylaxis   Codeine Itching   Latex Swelling   Gloves(when worn), condoms   Robaxin [methocarbamol] Itching   Sulfamethoxazole-trimethoprim Nausea Only, Other (See Comments)   Stomach pain    Tramadol Other (See Comments)      Medication List    STOP taking these medications   acetaminophen 500 MG tablet Commonly known as: TYLENOL     TAKE these medications   diphenhydramine-acetaminophen 25-500 MG Tabs tablet Commonly known as: TYLENOL PM Take 1 tablet by mouth at bedtime as needed (sleep).   gabapentin 800 MG  tablet Commonly known as: NEURONTIN Take 800 mg by mouth 3 (three) times daily. Take with 600 mg tablet to equal 1400 mg 3 times daily What changed: Another medication with the same name was changed. Make sure you understand how and when to take each.   gabapentin 600 MG tablet Commonly known as: NEURONTIN Take 2 tablets (1,200 mg total) by mouth 3 (three) times daily. What changed:   how much to take  additional instructions   GOODSENSE NASAL ALLERGY SPRAY NA Place 1 spray into the nose daily as needed (allergies).   Oxycodone HCl 10 MG Tabs Take 10 mg by mouth every 6 (six) hours as needed for pain.   OxyCONTIN 15 mg 12 hr tablet Generic drug: oxyCODONE Take 15 mg by mouth every 12 (twelve) hours as needed for pain.   sulfamethoxazole-trimethoprim 400-80 MG tablet Commonly known as: BACTRIM Take 1 tablet by mouth 2 (two) times daily.   tiZANidine 4 MG tablet Commonly known as: ZANAFLEX Take 1 tablet (4 mg total) by mouth every 6 (six) hours as needed for muscle spasms.      of note Mr. Stefanovic was both yelling and cursing at the time of his discharge. He is unhappy with the pain medication prescription that I am providing.   SignedAshok Pall 12/12/2019, 3:22 PM

## 2019-12-12 NOTE — Discharge Instructions (Signed)
Continue the antibiotics for one month.  Call the office if your temperature is greater than 101.5 Do not drive while taking narcotic medications, if you are not taking narcotics then you may drive after one week. You can be a passenger in a car. No lifting greater than 5lbs for 3 weeks, no pushing, pulling, bending, climbing You may walk as you see fit Do not take a bath until being seen in the office You do not need a dressing on the wound.

## 2019-12-14 ENCOUNTER — Other Ambulatory Visit: Payer: Self-pay

## 2019-12-14 ENCOUNTER — Inpatient Hospital Stay (HOSPITAL_COMMUNITY)
Admission: EM | Admit: 2019-12-14 | Discharge: 2019-12-18 | DRG: 858 | Discharge status: Against Medical Advice | Payer: Medicaid Other | Attending: Neurosurgery | Admitting: Neurosurgery

## 2019-12-14 ENCOUNTER — Encounter (HOSPITAL_COMMUNITY): Payer: Self-pay | Admitting: *Deleted

## 2019-12-14 DIAGNOSIS — Z79899 Other long term (current) drug therapy: Secondary | ICD-10-CM

## 2019-12-14 DIAGNOSIS — G894 Chronic pain syndrome: Secondary | ICD-10-CM | POA: Diagnosis present

## 2019-12-14 DIAGNOSIS — T8141XA Infection following a procedure, superficial incisional surgical site, initial encounter: Secondary | ICD-10-CM | POA: Diagnosis present

## 2019-12-14 DIAGNOSIS — E559 Vitamin D deficiency, unspecified: Secondary | ICD-10-CM | POA: Diagnosis present

## 2019-12-14 DIAGNOSIS — Z91040 Latex allergy status: Secondary | ICD-10-CM | POA: Diagnosis not present

## 2019-12-14 DIAGNOSIS — Z981 Arthrodesis status: Secondary | ICD-10-CM | POA: Diagnosis not present

## 2019-12-14 DIAGNOSIS — Y658 Other specified misadventures during surgical and medical care: Secondary | ICD-10-CM | POA: Diagnosis not present

## 2019-12-14 DIAGNOSIS — Z89022 Acquired absence of left finger(s): Secondary | ICD-10-CM | POA: Diagnosis not present

## 2019-12-14 DIAGNOSIS — Z96651 Presence of right artificial knee joint: Secondary | ICD-10-CM | POA: Diagnosis present

## 2019-12-14 DIAGNOSIS — Y838 Other surgical procedures as the cause of abnormal reaction of the patient, or of later complication, without mention of misadventure at the time of the procedure: Secondary | ICD-10-CM | POA: Diagnosis present

## 2019-12-14 DIAGNOSIS — Z885 Allergy status to narcotic agent status: Secondary | ICD-10-CM

## 2019-12-14 DIAGNOSIS — Z20828 Contact with and (suspected) exposure to other viral communicable diseases: Secondary | ICD-10-CM | POA: Diagnosis present

## 2019-12-14 DIAGNOSIS — Z86010 Personal history of colonic polyps: Secondary | ICD-10-CM

## 2019-12-14 DIAGNOSIS — G4700 Insomnia, unspecified: Secondary | ICD-10-CM | POA: Diagnosis present

## 2019-12-14 DIAGNOSIS — M62838 Other muscle spasm: Secondary | ICD-10-CM | POA: Diagnosis not present

## 2019-12-14 DIAGNOSIS — F17210 Nicotine dependence, cigarettes, uncomplicated: Secondary | ICD-10-CM | POA: Diagnosis present

## 2019-12-14 DIAGNOSIS — K2100 Gastro-esophageal reflux disease with esophagitis, without bleeding: Secondary | ICD-10-CM | POA: Diagnosis present

## 2019-12-14 DIAGNOSIS — R2241 Localized swelling, mass and lump, right lower limb: Secondary | ICD-10-CM | POA: Diagnosis not present

## 2019-12-14 DIAGNOSIS — Z888 Allergy status to other drugs, medicaments and biological substances status: Secondary | ICD-10-CM

## 2019-12-14 DIAGNOSIS — T8149XA Infection following a procedure, other surgical site, initial encounter: Secondary | ICD-10-CM | POA: Diagnosis present

## 2019-12-14 DIAGNOSIS — Z91013 Allergy to seafood: Secondary | ICD-10-CM

## 2019-12-14 DIAGNOSIS — Z590 Homelessness: Secondary | ICD-10-CM | POA: Diagnosis not present

## 2019-12-14 LAB — LACTIC ACID, PLASMA
Lactic Acid, Venous: 1.2 mmol/L (ref 0.5–1.9)
Lactic Acid, Venous: 3.6 mmol/L (ref 0.5–1.9)

## 2019-12-14 LAB — BASIC METABOLIC PANEL
Anion gap: 11 (ref 5–15)
BUN: 13 mg/dL (ref 6–20)
CO2: 25 mmol/L (ref 22–32)
Calcium: 9 mg/dL (ref 8.9–10.3)
Chloride: 105 mmol/L (ref 98–111)
Creatinine, Ser: 1 mg/dL (ref 0.61–1.24)
GFR calc Af Amer: >60 mL/min (ref >60)
GFR calc non Af Amer: >60 mL/min (ref >60)
Glucose, Bld: 86 mg/dL (ref 70–99)
Potassium: 4.6 mmol/L (ref 3.5–5.1)
Sodium: 141 mmol/L (ref 135–145)

## 2019-12-14 LAB — CBC WITH DIFFERENTIAL/PLATELET
Abs Immature Granulocytes: 0.13 10*3/uL — ABNORMAL HIGH (ref 0.00–0.07)
Basophils Absolute: 0.1 10*3/uL (ref 0.0–0.1)
Basophils Relative: 1 %
Eosinophils Absolute: 0.3 10*3/uL (ref 0.0–0.5)
Eosinophils Relative: 3 %
HCT: 42.4 % (ref 39.0–52.0)
Hemoglobin: 14.3 g/dL (ref 13.0–17.0)
Immature Granulocytes: 1 %
Lymphocytes Relative: 41 %
Lymphs Abs: 3.9 10*3/uL (ref 0.7–4.0)
MCH: 31.5 pg (ref 26.0–34.0)
MCHC: 33.7 g/dL (ref 30.0–36.0)
MCV: 93.4 fL (ref 80.0–100.0)
Monocytes Absolute: 1 10*3/uL (ref 0.1–1.0)
Monocytes Relative: 11 %
Neutro Abs: 4.1 10*3/uL (ref 1.7–7.7)
Neutrophils Relative %: 43 %
Platelets: 331 10*3/uL (ref 150–400)
RBC: 4.54 MIL/uL (ref 4.22–5.81)
RDW: 12.2 % (ref 11.5–15.5)
WBC: 9.5 10*3/uL (ref 4.0–10.5)
nRBC: 0 % (ref 0.0–0.2)

## 2019-12-14 LAB — SARS CORONAVIRUS 2 (TAT 6-24 HRS): SARS Coronavirus 2: NEGATIVE

## 2019-12-14 LAB — PROTIME-INR
INR: 0.9 (ref 0.8–1.2)
Prothrombin Time: 12 seconds (ref 11.4–15.2)

## 2019-12-14 LAB — APTT: aPTT: 28 seconds (ref 24–36)

## 2019-12-14 MED ORDER — POLYETHYLENE GLYCOL 3350 17 G PO PACK: 17.0000 g | PACK | Freq: Every day | ORAL | Status: DC | PRN

## 2019-12-14 MED ORDER — FLEET ENEMA 7-19 GM/118ML RE ENEM: 1.0000 | ENEMA | Freq: Once | RECTAL | Status: DC | PRN

## 2019-12-14 MED ORDER — SODIUM CHLORIDE 0.9 % IV SOLN: INTRAVENOUS | Status: DC

## 2019-12-14 MED ORDER — BISACODYL 10 MG RE SUPP: 10.0000 mg | Freq: Every day | RECTAL | Status: DC | PRN

## 2019-12-14 MED ORDER — GABAPENTIN 800 MG PO TABS
800.0000 mg | ORAL_TABLET | Freq: Three times a day (TID) | ORAL | Status: DC
  Filled 2019-12-14 (×2): qty 1

## 2019-12-14 MED ORDER — DIPHENHYDRAMINE-APAP (SLEEP) 25-500 MG PO TABS: 1.0000 | ORAL_TABLET | Freq: Every evening | ORAL | Status: DC | PRN

## 2019-12-14 MED ORDER — GABAPENTIN 600 MG PO TABS
600.0000 mg | ORAL_TABLET | Freq: Three times a day (TID) | ORAL | Status: DC
  Administered 2019-12-14 – 2019-12-16 (×6): 600 mg via ORAL
  Filled 2019-12-14 (×5): qty 1

## 2019-12-14 MED ORDER — DIPHENHYDRAMINE HCL 25 MG PO CAPS: 25.0000 mg | ORAL_CAPSULE | Freq: Every evening | ORAL | Status: DC | PRN

## 2019-12-14 MED ORDER — ONDANSETRON HCL 4 MG PO TABS: 4.0000 mg | ORAL_TABLET | Freq: Four times a day (QID) | ORAL | Status: DC | PRN

## 2019-12-14 MED ORDER — ZOLPIDEM TARTRATE 5 MG PO TABS
5.0000 mg | ORAL_TABLET | Freq: Every evening | ORAL | Status: DC | PRN
  Administered 2019-12-15: 5 mg via ORAL
  Filled 2019-12-14 (×2): qty 1

## 2019-12-14 MED ORDER — TIZANIDINE HCL 2 MG PO TABS
4.0000 mg | ORAL_TABLET | Freq: Four times a day (QID) | ORAL | Status: DC | PRN
  Administered 2019-12-15: 4 mg via ORAL
  Filled 2019-12-14: qty 1
  Filled 2019-12-14: qty 2
  Filled 2019-12-14: qty 1

## 2019-12-14 MED ORDER — OXYCODONE HCL 5 MG PO TABS
5.0000 mg | ORAL_TABLET | ORAL | Status: DC | PRN
  Administered 2019-12-14 – 2019-12-18 (×16): 10 mg via ORAL
  Filled 2019-12-14 (×16): qty 2

## 2019-12-14 MED ORDER — HYDROMORPHONE HCL 1 MG/ML IJ SOLN
1.0000 mg | INTRAMUSCULAR | Status: AC | PRN
  Administered 2019-12-14 – 2019-12-15 (×3): 1 mg via INTRAVENOUS
  Filled 2019-12-14 (×3): qty 1

## 2019-12-14 MED ORDER — ONDANSETRON HCL 4 MG/2ML IJ SOLN: 4.0000 mg | Freq: Four times a day (QID) | INTRAMUSCULAR | Status: DC | PRN

## 2019-12-14 MED ORDER — GABAPENTIN 400 MG PO CAPS
800.0000 mg | ORAL_CAPSULE | Freq: Three times a day (TID) | ORAL | Status: DC
  Administered 2019-12-14 – 2019-12-17 (×10): 800 mg via ORAL
  Filled 2019-12-14 (×10): qty 2

## 2019-12-14 MED ORDER — SODIUM CHLORIDE 0.9% FLUSH
3.0000 mL | Freq: Two times a day (BID) | INTRAVENOUS | Status: DC
  Administered 2019-12-14 – 2019-12-18 (×2): 3 mL via INTRAVENOUS

## 2019-12-14 MED ORDER — ACETAMINOPHEN 500 MG PO TABS
500.0000 mg | ORAL_TABLET | Freq: Every evening | ORAL | Status: DC | PRN
  Administered 2019-12-14: 500 mg via ORAL

## 2019-12-14 MED ORDER — ACETAMINOPHEN 650 MG RE SUPP: 650.0000 mg | Freq: Four times a day (QID) | RECTAL | Status: DC | PRN

## 2019-12-14 MED ORDER — SULFAMETHOXAZOLE-TRIMETHOPRIM 400-80 MG PO TABS
1.0000 | ORAL_TABLET | Freq: Two times a day (BID) | ORAL | Status: DC
  Administered 2019-12-14 – 2019-12-15 (×3): 1 via ORAL
  Filled 2019-12-14 (×6): qty 1

## 2019-12-14 MED ORDER — DOCUSATE SODIUM 100 MG PO CAPS
100.0000 mg | ORAL_CAPSULE | Freq: Two times a day (BID) | ORAL | Status: DC
  Administered 2019-12-14 – 2019-12-16 (×4): 100 mg via ORAL
  Filled 2019-12-14 (×6): qty 1

## 2019-12-14 MED ORDER — ACETAMINOPHEN 325 MG PO TABS
650.0000 mg | ORAL_TABLET | Freq: Four times a day (QID) | ORAL | Status: DC | PRN
  Administered 2019-12-16 (×2): 650 mg via ORAL
  Filled 2019-12-14 (×3): qty 2

## 2019-12-14 NOTE — ED Provider Notes (Signed)
[facility] EMERGENCY DEPARTMENT Provider Note   CSN: [id number] Arrival date & time: 12/14/19  1306     History Chief Complaint  Patient presents with  . Post-op Problem    [name] Makay Scheidt. is a 48 y.o. male.  The history is provided by the patient and medical records. No language interpreter was used.   [name] Antonious Kort. is a 48 y.o. male who presents to the Emergency Department complaining of postoperative problem. He is status post lumbar surgery and presents today for evaluation of possible draining from his surgical site that began yesterday. He complains of increased pain at the surgical site. He also complains of bilateral lower extremity weakness that he states his chronic in nature. Denies any fevers, nausea, vomiting, diarrhea, urinary incontinence. He has chronic numbness in all four extremities - unchanged. Symptoms are severe, constant, worsening.     Past Medical History:  Diagnosis Date  . Arthritis    hands, knees, lower back  . Bilateral sciatica   . Carpal tunnel syndrome   . Chronic back pain   . Chronic neck pain   . Chronic pain syndrome   . GERD (gastroesophageal reflux disease)   . Hand joint pain 07/02/2013  . Herniated disc   . Traumatic amputation of left index finger 2013  . Wears dentures    full upper and lower    Patient Active Problem List   Diagnosis Date Noted  . Wound infection after surgery 12/06/2019  . CSF leak 11/28/2019  . Postoperative CSF leak 11/28/2019  . HNP (herniated nucleus pulposus), lumbar 11/18/2019  . Umbilical hernia without obstruction and without gangrene 10/23/2019  . Osteoarthritis of spine with radiculopathy, lumbar region 10/09/2019  . Chronic bilateral low back pain with bilateral sciatica 10/07/2019  . S/P cervical spinal fusion (C5-C6 ACDF) 07/15/2019  . Cervical radicular pain 07/15/2019  . Cervical facet joint syndrome 07/15/2019  . Degeneration of lumbar intervertebral disc  07/03/2019  . Lumbar radicular pain 07/03/2019  . Cervical spondylosis 04/17/2019  . Homeless single person 04/17/2019  . History of total knee replacement, right 04/25/2018  . Pancreatic mass 01/14/2018  . Tobacco abuse 01/14/2018  . Chronic neck pain   . Benign neoplasm of descending colon   . Benign neoplasm of ascending colon   . Intractable vomiting with nausea   . Reflux esophagitis   . Duodenal ulcer without hemorrhage or perforation   . Chronic pain syndrome 08/07/2017  . Vitamin D deficiency 06/20/2017  . Insomnia 06/19/2017  . Right sided sciatica   . Presence of artificial knee joint 01/17/2013  . Knee pain 01/17/2013  . 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 AMPUTATION FINGER / THUMB Left 2013  . COLONOSCOPY WITH PROPOFOL N/A 01/03/2018   Procedure: COLONOSCOPY WITH PROPOFOL;  Surgeon: [name], MD;  Location: [facility];  Service: Endoscopy;  Laterality: N/A;  . ESOPHAGOGASTRODUODENOSCOPY (EGD) WITH PROPOFOL N/A 01/03/2018   Procedure: ESOPHAGOGASTRODUODENOSCOPY (EGD) WITH PROPOFOL;  Surgeon: [name], MD;  Location: [facility];  Service: Endoscopy;  Laterality: N/A;  . HERNIA REPAIR Right    right inguinal hernia repaired twice  . HIP SURGERY    . JOINT REPLACEMENT Right    knee  . KNEE SURGERY Right   . LUMBAR LAMINECTOMY/DECOMPRESSION MICRODISCECTOMY N/A 11/18/2019   Procedure: LUMBAR FOUR-FIVE LUMBAR LAMINECTOMY/DECOMPRESSION MICRODISCECTOMY;  Surgeon: [name], MD;  Location:  [facility];  Service: Neurosurgery;  Laterality: N/A;  . LUMBAR WOUND DEBRIDEMENT N/A 12/06/2019   Procedure: LUMBAR WOUND REVISION;  Surgeon: [name], MD;  Location: [facility];  Service: Neurosurgery;  Laterality: N/A;  . neck fusion    . POLYPECTOMY N/A 01/03/2018   Procedure: POLYPECTOMY;  Surgeon: [name], MD;  Location: [facility];   Service: Endoscopy;  Laterality: N/A;  . REPAIR OF CEREBROSPINAL FLUID LEAK N/A 11/28/2019   Procedure: REPAIR OF CEREBROSPINAL FLUID LEAK/LUMBAR;  Surgeon: [name], MD;  Location: [facility];  Service: Neurosurgery;  Laterality: N/A;  REPAIR OF CEREBROSPINAL FLUID LEAK/LUMBAR  . REPLACEMENT TOTAL KNEE Right   . SHOULDER SURGERY Right 2016 X 2  . SPINAL FUSION    . TONSILLECTOMY    . UMBILICAL HERNIA REPAIR N/A 11/07/2019   Procedure: HERNIA REPAIR UMBILICAL ADULT;  Surgeon: [name], MD;  Location: [facility];  Service: General;  Laterality: N/A;       Family History  Problem Relation Age of Onset  . Cancer Father        sarcoma  . Cirrhosis Paternal Grandmother   . Cancer Paternal Grandfather        Pancreatic  . Fibromyalgia Sister   . Deafness Sister     Social History   Tobacco Use  . Smoking status: Current Every Day Smoker    Packs/day: 0.50    Years: 25.00    Pack years: 12.50    Types: Cigarettes  . Smokeless tobacco: Never Used  Substance Use Topics  . Alcohol use: No  . Drug use: No    Home Medications Prior to Admission medications   Medication Sig Start Date End Date Taking? Authorizing Provider  gabapentin (NEURONTIN) 600 MG tablet Take 2 tablets (1,200 mg total) by mouth 3 (three) times daily. Patient taking differently: Take 600 mg by mouth 3 (three) times daily. Take with 800mg  tablet for 1400mg  total 12/04/19  Yes Johnson, Megan P, DO  gabapentin (NEURONTIN) 800 MG tablet Take 800 mg by mouth 3 (three) times daily. Take with 600 mg tablet to equal 1400 mg 3 times daily   Yes [provider]  Oxycodone HCl 10 MG TABS Take 10 mg by mouth every 6 (six) hours as needed for pain. 12/01/19  Yes [provider]  sulfamethoxazole-trimethoprim (BACTRIM) 400-80 MG tablet Take 1 tablet by mouth 2 (two) times daily. 12/12/19  Yes [name], MD  tiZANidine (ZANAFLEX) 4 MG tablet Take 1 tablet (4 mg total) by mouth every 6 (six) hours as  needed for muscle spasms. 11/18/19  Yes [name], MD  diphenhydramine-acetaminophen (TYLENOL PM) 25-500 MG TABS tablet Take 1 tablet by mouth at bedtime as needed (sleep).     [provider]  OXYCONTIN 15 MG 12 hr tablet Take 15 mg by mouth every 12 (twelve) hours as needed for pain. 12/02/19   [provider]  Triamcinolone Acetonide (GOODSENSE NASAL ALLERGY SPRAY NA) Place 1 spray into the nose daily as needed (allergies).    [provider]    Allergies    Otho Darner allergy], Flexeril [cyclobenzaprine], Codeine, Latex, Robaxin [methocarbamol], Sulfamethoxazole-trimethoprim, and Tramadol  Review of Systems   Review of Systems  All other systems reviewed and are negative.   Physical Exam Updated Vital Signs BP (!) 150/119 (BP Location: Right Arm)   Pulse 83   Temp 98.2 F (36.8 C) (Oral)   Resp 12   Ht 6' (1.829 m)   Wt 86.2 kg  SpO2 100%   BMI 25.77 kg/m   Physical Exam Vitals and nursing note reviewed.  Constitutional:      Appearance: He is well-developed.  HENT:     Head: Normocephalic and atraumatic.  Cardiovascular:     Rate and Rhythm: Normal rate and regular rhythm.     Heart sounds: No murmur.  Pulmonary:     Effort: Pulmonary effort is normal. No respiratory distress.  Abdominal:     Palpations: Abdomen is soft.     Tenderness: There is no guarding or rebound.     Comments: Mild lower abdominal tenderness  Musculoskeletal:        General: No tenderness.     Comments: Wound on lower lumbar spine with moderate local edema with mild erythema. There is focal tenderness. There is a small amount of serious drainage at the inferior portion of the wound.  Skin:    General: Skin is warm and dry.  Neurological:     Mental Status: He is alert and oriented to person, place, and time.     Comments: 4/5 strength and bilateral lower extremities, five out of five strength and bilateral upper extremities. Altered sensation to light  touch in all four extremities but there is sensation to light touch in all four extremities.  Psychiatric:        Behavior: Behavior normal.     ED Results / Procedures / Treatments   Labs (all labs ordered are listed, but only abnormal results are displayed) Labs Reviewed  CBC WITH DIFFERENTIAL/PLATELET - Abnormal; Notable for the following components:      Result Value   Abs Immature Granulocytes 0.13 (*)    All other components within normal limits  SARS CORONAVIRUS 2 (TAT 6-24 HRS)  BASIC METABOLIC PANEL  LACTIC ACID, PLASMA  APTT  PROTIME-INR  LACTIC ACID, PLASMA    EKG None  Radiology No results found.  Procedures Procedures (including critical care time)  Medications Ordered in ED Medications  0.9 %  sodium chloride infusion (has no administration in time range)  acetaminophen (TYLENOL) tablet 650 mg (has no administration in time range)    Or  acetaminophen (TYLENOL) suppository 650 mg (has no administration in time range)  oxyCODONE (Oxy IR/ROXICODONE) immediate release tablet 5-10 mg (has no administration in time range)  zolpidem (AMBIEN) tablet 5 mg (has no administration in time range)  docusate sodium (COLACE) capsule 100 mg (has no administration in time range)  polyethylene glycol (MIRALAX / GLYCOLAX) packet 17 g (has no administration in time range)  bisacodyl (DULCOLAX) suppository 10 mg (has no administration in time range)  sodium phosphate (FLEET) 7-19 GM/118ML enema 1 enema (has no administration in time range)  ondansetron (ZOFRAN) tablet 4 mg (has no administration in time range)    Or  ondansetron (ZOFRAN) injection 4 mg (has no administration in time range)  sodium chloride flush (NS) 0.9 % injection 3 mL (has no administration in time range)  HYDROmorphone (DILAUDID) injection 1 mg (has no administration in time range)  sulfamethoxazole-trimethoprim (BACTRIM) 400-80 MG per tablet 1 tablet (has no administration in time range)    diphenhydramine-acetaminophen (TYLENOL PM) 25-500 MG per tablet 1 tablet (has no administration in time range)  gabapentin (NEURONTIN) tablet 600 mg (has no administration in time range)  gabapentin (NEURONTIN) tablet 800 mg (has no administration in time range)  tiZANidine (ZANAFLEX) tablet 4 mg (has no administration in time range)    ED Course  I have reviewed the triage vital signs  and the nursing notes.  Pertinent labs & imaging results that were available during my care of the patient were reviewed by me and considered in my medical decision making (see chart for details).    MDM Rules/Calculators/A&P                      Patient here for evaluation of drainage from low back wound, recent admissions and surgeries for CSF leak. He was evaluated by neurosurgery shortly after ED arrival. Plan to admit for further management. Final Clinical Impression(s) / ED Diagnoses Final diagnoses:  None    Rx / DC Orders ED Discharge Orders    None       Quintella Reichert, MD 12/14/19 1526

## 2019-12-14 NOTE — ED Triage Notes (Signed)
PT returns from home with ongoing back surgery complications. Pt reports drainage and swelling at surgical site. Pt reports he feels weak but can stand for short periods of time.

## 2019-12-14 NOTE — H&P (Signed)
Chief Complaint   Chief Complaint  Patient presents with  . Post-op Problem    HPI   HPI: Johnny Kronk. is a 48 y.o. male who presents with persistent lumbar wound drainage. Patient has a fairly extensive history over the past month after undergoing L4-5 micodiscectomy on 11/24 by Dr Christella Noa.  11/24: admitted for L4-5 microdisc. Uncomplicated hospital course. D/C same day 12/4-12/7:  Readmitted after CSF repair. Uncomplicated hospital course.  12/12-12/18: presented to ED with drainage from lumbar wound. Thought to orginally be CSF leak. Intraop found purulent discharge. All superficial. Cultures obtained, grew Panteoa. ID consulted. Placed on Bactrim po x14 days.   Called on call service. Advised me he was again draining purulent discharge. Advised him to return to ED. Met patient upon arrival. Complains of severe lower back pain. Not well controlled with Oxy 62m q 6 hours. Pre op radicular pains have remained resolved. No new radicular pain/paresthesia. No bowel/bladder dysfunction. No fevers. Taking Batrim as rx. Last ate 1130am.   Patient Active Problem List   Diagnosis Date Noted  . Wound infection after surgery 12/06/2019  . CSF leak 11/28/2019  . Postoperative CSF leak 11/28/2019  . HNP (herniated nucleus pulposus), lumbar 11/18/2019  . Umbilical hernia without obstruction and without gangrene 10/23/2019  . Osteoarthritis of spine with radiculopathy, lumbar region 10/09/2019  . Chronic bilateral low back pain with bilateral sciatica 10/07/2019  . S/P cervical spinal fusion (C5-C6 ACDF) 07/15/2019  . Cervical radicular pain 07/15/2019  . Cervical facet joint syndrome 07/15/2019  . Degeneration of lumbar intervertebral disc 07/03/2019  . Lumbar radicular pain 07/03/2019  . Cervical spondylosis 04/17/2019  . Homeless single person 04/17/2019  . History of total knee replacement, right 04/25/2018  . Pancreatic mass 01/14/2018  . Tobacco abuse 01/14/2018  .  Chronic neck pain   . Benign neoplasm of descending colon   . Benign neoplasm of ascending colon   . Intractable vomiting with nausea   . Reflux esophagitis   . Duodenal ulcer without hemorrhage or perforation   . Chronic pain syndrome 08/07/2017  . Vitamin D deficiency 06/20/2017  . Insomnia 06/19/2017  . Right sided sciatica   . Presence of artificial knee joint 01/17/2013  . Knee pain 01/17/2013  . 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 Arthritis    hands, knees, lower back  . Bilateral sciatica   . Carpal tunnel syndrome   . Chronic back pain   . Chronic neck pain   . Chronic pain syndrome   . GERD (gastroesophageal reflux disease)   . Hand joint pain 07/02/2013  . Herniated disc   . Traumatic amputation of left index finger 2013  . Wears dentures    full upper and lower    PSH: Past Surgical History:  Procedure Laterality Date  . AMPUTATION FINGER / THUMB Left 2013  . COLONOSCOPY WITH PROPOFOL N/A 01/03/2018   Procedure: COLONOSCOPY WITH PROPOFOL;  Surgeon: WLucilla Lame MD;  Location: [facility]  Service: Endoscopy;  Laterality: N/A;  . ESOPHAGOGASTRODUODENOSCOPY (EGD) WITH PROPOFOL N/A 01/03/2018   Procedure: ESOPHAGOGASTRODUODENOSCOPY (EGD) WITH PROPOFOL;  Surgeon: WLucilla Lame MD;  [place] [facility]  Service: Endoscopy;  Laterality: N/A;  . HERNIA REPAIR Right    right inguinal hernia repaired twice  . HIP SURGERY    . JOINT REPLACEMENT Right    knee  . KNEE SURGERY Right   .  LUMBAR LAMINECTOMY/DECOMPRESSION MICRODISCECTOMY N/A 11/18/2019   Procedure: LUMBAR FOUR-FIVE LUMBAR LAMINECTOMY/DECOMPRESSION MICRODISCECTOMY;  Surgeon: [name], MD;  Location: [facility];  Service: Neurosurgery;  Laterality: N/A;  . LUMBAR WOUND DEBRIDEMENT N/A 12/06/2019   Procedure: LUMBAR WOUND REVISION;  Surgeon: [name], MD;  Location: [facility];  Service:  Neurosurgery;  Laterality: N/A;  . neck fusion    . POLYPECTOMY N/A 01/03/2018   Procedure: POLYPECTOMY;  Surgeon: [name], MD;  Location: [facility];  Service: Endoscopy;  Laterality: N/A;  . REPAIR OF CEREBROSPINAL FLUID LEAK N/A 11/28/2019   Procedure: REPAIR OF CEREBROSPINAL FLUID LEAK/LUMBAR;  Surgeon: [name], MD;  Location: [facility];  Service: Neurosurgery;  Laterality: N/A;  REPAIR OF CEREBROSPINAL FLUID LEAK/LUMBAR  . REPLACEMENT TOTAL KNEE Right   . SHOULDER SURGERY Right 2016 X 2  . SPINAL FUSION    . TONSILLECTOMY    . UMBILICAL HERNIA REPAIR N/A 11/07/2019   Procedure: HERNIA REPAIR UMBILICAL ADULT;  Surgeon: [name], MD;  Location: [facility];  Service: General;  Laterality: N/A;    (Not in a hospital admission)   SH: Social History   Tobacco Use  . Smoking status: Current Every Day Smoker    Packs/day: 0.50    Years: 25.00    Pack years: 12.50    Types: Cigarettes  . Smokeless tobacco: Never Used  Substance Use Topics  . Alcohol use: No  . Drug use: No    MEDS: Prior to Admission medications   Medication Sig Start Date End Date Taking? Authorizing Provider  diphenhydramine-acetaminophen (TYLENOL PM) 25-500 MG TABS tablet Take 1 tablet by mouth at bedtime as needed (sleep).     [provider]  gabapentin (NEURONTIN) 600 MG tablet Take 2 tablets (1,200 mg total) by mouth 3 (three) times daily. Patient taking differently: Take 600 mg by mouth 3 (three) times daily. Take with 8102m tablet for 14052mtotal 12/04/19   Johnson, Megan P, DO  gabapentin (NEURONTIN) 800 MG tablet Take 800 mg by mouth 3 (three) times daily. Take with 600 mg tablet to equal 1400 mg 3 times daily    [provider]  Oxycodone HCl 10 MG TABS Take 10 mg by mouth every 6 (six) hours as needed for pain. 12/01/19   [provider]  OXYCONTIN 15 MG 12 hr tablet Take 15 mg by mouth every 12 (twelve) hours as needed for pain. 12/02/19   [provider]  sulfamethoxazole-trimethoprim (BACTRIM) 400-80 MG tablet Take 1 tablet by mouth 2 (two) times daily. 12/12/19   CaAshok PallMD  tiZANidine (ZANAFLEX) 4 MG tablet Take 1 tablet (4 mg total) by mouth every 6 (six) hours as needed for muscle spasms. 11/18/19   CaAshok PallMD  Triamcinolone Acetonide (GOODSENSE NASAL ALLERGY SPRAY NA) Place 1 spray into the nose daily as needed (allergies).    [provider]    ALLERGY: Allergies  Allergen Reactions  . Crab [Shellfish Allergy] Itching and Swelling  . Flexeril [Cyclobenzaprine] Anaphylaxis  . Codeine Itching  . Latex Swelling    Gloves(when worn), condoms  . Robaxin [Methocarbamol] Itching  . Sulfamethoxazole-Trimethoprim Nausea Only and Other (See Comments)    Stomach pain   . Tramadol Other (See Comments)    Social History   Tobacco Use  . Smoking status: Current Every Day Smoker    Packs/day: 0.50    Years: 25.00    Pack years: 12.50    Types: Cigarettes  . Smokeless tobacco: Never Used  Substance Use Topics  . Alcohol use: No     Family History  Problem Relation Age of Onset  . Cancer Father        sarcoma  . Cirrhosis Paternal Grandmother   . Cancer Paternal Grandfather        Pancreatic  . Fibromyalgia Sister   . Deafness Sister      ROS   Review of Systems  Constitutional: Negative.   HENT: Negative.   Eyes: Negative.   Respiratory: Negative.   Cardiovascular: Negative.   Gastrointestinal: Negative for nausea and vomiting.  Genitourinary: Negative.   Musculoskeletal: Positive for back pain and myalgias. Negative for falls, joint pain and neck pain.  Skin: Negative.   Neurological: Negative for dizziness, tingling, tremors, sensory change, speech change, focal weakness, seizures, loss of consciousness, weakness and headaches.    Exam   Vitals:   12/14/19 1318 12/14/19 1321  BP: (!) 157/102   Pulse: 92   Resp: 12   Temp: 98.8 F (37.1 C)   SpO2: 97% 97%   General  appearance: WDWN, NAD Eyes: No scleral injection Cardiovascular: Regular rate and rhythm without murmurs, rubs, gallops. No edema or variciosities. Distal pulses normal. Pulmonary: Effort normal, non-labored breathing Musculoskeletal:     Muscle tone upper extremities: Normal    Muscle tone lower extremities: Normal    Motor exam: Upper Extremities Deltoid Bicep Tricep Grip  Right 5/5 5/5 5/5 5/5  Left 5/5 5/5 5/5 5/5   Lower Extremity IP Quad PF DF EHL  Right 4+/5 4+/5 5/5 5/5 5/5  Left 4+/5 4+/5 5/5 5/5 5/5   Pain mediated weakness proximal BLE  Neurological Mental Status:    - Patient is awake, alert, oriented to person, place, month, year, and situation    - Patient is able to give a clear and coherent history.    - No signs of aphasia or neglect Cranial Nerves    - II: Visual Fields are full. PERRL    - III/IV/VI: EOMI without ptosis or diploplia.     - V: Facial sensation is grossly normal    - VII: Facial movement is symmetric.     - VIII: hearing is intact to voice    - X: Uvula elevates symmetrically    - XI: Shoulder shrug is symmetric.    - XII: tongue is midline without atrophy or fasciculations.  Sensory: Sensation grossly intact to LT  Lumbar wound:  Moderate swelling surrounding incision. + redness and warmth. No streawking. Areas of granulation and minimal purulent discharge expressed. No significant opening to wound.  Results - Imaging/Labs   No results found for this or any previous visit (from the past 48 hour(s)).  No results found.  Impression/Plan   48 y.o. male with known Panteoa superficial lumbar wound infection, currently on Bactrim BID, with what looks like recurrent pus accumulation without any significant active drainage. Neuro intact with exception of pain mediated weakness in proximal BLE. Reviewed with Dr Kathyrn Sheriff. He will need to undergo wound exploration with likely placement of wound vac. While this is a necessity, it is not an  emergency - he is afebrile, not tachycardic/tachypneic, looks overall well. Will admit for pain control. Okay for diet now, but NPO at midnight in preparation for likely surgery tomorrow. Will advise Dr Christella Noa and leave surgical decision up to him.  - continue po Bactrim - pain control  Ferne Reus, PA-C [facility]

## 2019-12-14 NOTE — Chronic Care Management (AMB) (Signed)
  Care Management   Follow Up Note   12/14/2019 Name: Johnny [name]. MRN: [id number] DOB: [birth year]  Referred by: Valerie Roys, DO Reason for referral : Care Coordination   Johnny [name]. is a 48 y.o. year old male who is a primary care patient of Valerie Roys, DO. The care management team was consulted for assistance with care management and care coordination needs.    Review of patient status, including review of consultants reports, relevant laboratory and other test results, and collaboration with appropriate care team members and the patient's provider was performed as part of comprehensive patient evaluation and provision of chronic care management services.    SDOH (Social Determinants of Health) screening performed today: Financial Strain . See Care Plan for related entries.   Advanced Directives: See Care Plan and [employer] application for related entries.   Goals Addressed    . "I need ongoing financial support." (pt-stated)       Current Barriers:  . Financial constraints . Limited social support . Limited access to food . Housing barriers . Lack of transportation- Medicaid transportation enrollment  Clinical Social Work Clinical Goal(s):  Marland Kitchen Over the next 90 days, client will work with SW to address concerns related to food insecurity and financial strain. . Over the next 90 days, patient will be educated on available food support, financial support, crisis support resources within the area that will support patient.  Interventions: . Patient interviewed and appropriate assessments performed . Patient reports that he has had 3 surgeries in the past week. Patient is still in the hospital. . Patient reports that once he discharges from the hospital, he will relocate to a new apartment that he is completely satisfied with. Patient reports also having a job lined up for post discharge. He can work up to 7 day per week if desired. In addition, pt shares  that he is interested in getting a [employer] as his main source of transportation. . Provided patient with information about financial, crisis, food and housing support resources as patient continues to face financial strain.  . Discussed plans with patient for ongoing care management follow up and provided patient with direct contact information for care management team  . Assisted patient/caregiver with obtaining information about health plan benefits . Collaborated with [employer] agencies in the past: [employer], [employer], and [employer]  . Patient reports ongoing active communication with [employer]. .  Patient Self Care Activities:  . Attends all scheduled provider appointments . Calls provider office for new concerns or questions  Please see past updates related to this goal by clicking on the "Past Updates" button in the selected goal      The care management team will reach out to the patient again over the next 60 days.   [name], BSW, [name], [facility] Care Management [email or web address] Phone: [phone]

## 2019-12-14 NOTE — Progress Notes (Addendum)
CRITICAL VALUE ALERT  Critical Value:  Lactic Acid 3.6  Date & Time Notied:  12/14/19 @ 1730  Provider Notified: On call Costella Vincent/ Nundkumar via Amion  Orders Received/Actions taken: Awaiting for further instructions.  Will continue to monitor.

## 2019-12-15 ENCOUNTER — Encounter (HOSPITAL_COMMUNITY)
Admission: EM | Discharge status: Against Medical Advice | Payer: Self-pay | Source: Home / Self Care | Attending: Neurosurgery

## 2019-12-15 ENCOUNTER — Other Ambulatory Visit: Payer: Self-pay

## 2019-12-15 ENCOUNTER — Inpatient Hospital Stay (HOSPITAL_COMMUNITY): Payer: Medicaid Other | Admitting: Anesthesiology

## 2019-12-15 ENCOUNTER — Encounter (HOSPITAL_COMMUNITY): Payer: Self-pay | Admitting: Neurosurgery

## 2019-12-15 ENCOUNTER — Other Ambulatory Visit: Payer: Self-pay | Admitting: Neurosurgery

## 2019-12-15 HISTORY — PX: WOUND EXPLORATION: SHX6188

## 2019-12-15 SURGERY — WOUND EXPLORATION
Anesthesia: General | Site: Back

## 2019-12-15 MED ORDER — LIDOCAINE 2% (20 MG/ML) 5 ML SYRINGE
INTRAMUSCULAR | Status: AC
  Filled 2019-12-15: qty 15

## 2019-12-15 MED ORDER — 0.9 % SODIUM CHLORIDE (POUR BTL) OPTIME
TOPICAL | Status: DC | PRN
  Administered 2019-12-15: 1000 mL

## 2019-12-15 MED ORDER — SUGAMMADEX SODIUM 500 MG/5ML IV SOLN
INTRAVENOUS | Status: AC
  Filled 2019-12-15: qty 10

## 2019-12-15 MED ORDER — ROCURONIUM BROMIDE 10 MG/ML (PF) SYRINGE
PREFILLED_SYRINGE | INTRAVENOUS | Status: AC
  Filled 2019-12-15: qty 50

## 2019-12-15 MED ORDER — LIDOCAINE HCL (CARDIAC) PF 100 MG/5ML IV SOSY
PREFILLED_SYRINGE | INTRAVENOUS | Status: DC | PRN
  Administered 2019-12-15: 80 mg via INTRATRACHEAL

## 2019-12-15 MED ORDER — DIPHENHYDRAMINE HCL 50 MG/ML IJ SOLN
INTRAMUSCULAR | Status: DC | PRN
  Administered 2019-12-15: 12.5 mg via INTRAVENOUS

## 2019-12-15 MED ORDER — PROPOFOL 10 MG/ML IV BOLUS
INTRAVENOUS | Status: AC
  Filled 2019-12-15: qty 20

## 2019-12-15 MED ORDER — BUPIVACAINE HCL (PF) 0.5 % IJ SOLN
INTRAMUSCULAR | Status: AC
  Filled 2019-12-15: qty 30

## 2019-12-15 MED ORDER — ONDANSETRON HCL 4 MG/2ML IJ SOLN: 4.0000 mg | Freq: Once | INTRAMUSCULAR | Status: DC | PRN

## 2019-12-15 MED ORDER — MIDAZOLAM HCL 2 MG/2ML IJ SOLN
INTRAMUSCULAR | Status: AC
  Filled 2019-12-15: qty 2

## 2019-12-15 MED ORDER — EPHEDRINE 5 MG/ML INJ
INTRAVENOUS | Status: AC
  Filled 2019-12-15: qty 20

## 2019-12-15 MED ORDER — KETOROLAC TROMETHAMINE 30 MG/ML IJ SOLN
30.0000 mg | Freq: Once | INTRAMUSCULAR | Status: AC
  Administered 2019-12-15: 30 mg via INTRAVENOUS
  Filled 2019-12-15: qty 1

## 2019-12-15 MED ORDER — PHENYLEPHRINE 40 MCG/ML (10ML) SYRINGE FOR IV PUSH (FOR BLOOD PRESSURE SUPPORT)
PREFILLED_SYRINGE | INTRAVENOUS | Status: AC
  Filled 2019-12-15: qty 10

## 2019-12-15 MED ORDER — DEXAMETHASONE SODIUM PHOSPHATE 10 MG/ML IJ SOLN
INTRAMUSCULAR | Status: AC
  Filled 2019-12-15: qty 1

## 2019-12-15 MED ORDER — FENTANYL CITRATE (PF) 250 MCG/5ML IJ SOLN
INTRAMUSCULAR | Status: AC
  Filled 2019-12-15: qty 5

## 2019-12-15 MED ORDER — ROCURONIUM 10MG/ML (10ML) SYRINGE FOR MEDFUSION PUMP - OPTIME
INTRAVENOUS | Status: DC | PRN
  Administered 2019-12-15: 50 mg via INTRAVENOUS

## 2019-12-15 MED ORDER — LACTATED RINGERS IV SOLN: INTRAVENOUS | Status: DC | PRN

## 2019-12-15 MED ORDER — CHLORHEXIDINE GLUCONATE CLOTH 2 % EX PADS: 6.0000 | MEDICATED_PAD | Freq: Once | CUTANEOUS | Status: DC

## 2019-12-15 MED ORDER — PROPOFOL 10 MG/ML IV BOLUS
INTRAVENOUS | Status: DC | PRN
  Administered 2019-12-15: 180 ug via INTRAVENOUS

## 2019-12-15 MED ORDER — CHLORHEXIDINE GLUCONATE CLOTH 2 % EX PADS
6.0000 | MEDICATED_PAD | Freq: Once | CUTANEOUS | Status: AC
  Administered 2019-12-15: 6 via TOPICAL

## 2019-12-15 MED ORDER — MIDAZOLAM HCL 2 MG/2ML IJ SOLN
INTRAMUSCULAR | Status: DC | PRN
  Administered 2019-12-15: 2 mg via INTRAVENOUS

## 2019-12-15 MED ORDER — ONDANSETRON HCL 4 MG/2ML IJ SOLN
INTRAMUSCULAR | Status: DC | PRN
  Administered 2019-12-15: 4 mg via INTRAVENOUS

## 2019-12-15 MED ORDER — FENTANYL CITRATE (PF) 100 MCG/2ML IJ SOLN
INTRAMUSCULAR | Status: AC
  Filled 2019-12-15: qty 4

## 2019-12-15 MED ORDER — SUGAMMADEX SODIUM 500 MG/5ML IV SOLN
INTRAVENOUS | Status: DC | PRN
  Administered 2019-12-15: 300 mg via INTRAVENOUS

## 2019-12-15 MED ORDER — FENTANYL CITRATE (PF) 100 MCG/2ML IJ SOLN
25.0000 ug | INTRAMUSCULAR | Status: DC | PRN
  Administered 2019-12-15: 100 ug via INTRAVENOUS
  Administered 2019-12-15 (×2): 50 ug via INTRAVENOUS

## 2019-12-15 MED ORDER — SUCCINYLCHOLINE CHLORIDE 200 MG/10ML IV SOSY
PREFILLED_SYRINGE | INTRAVENOUS | Status: AC
  Filled 2019-12-15: qty 20

## 2019-12-15 MED ORDER — BUPIVACAINE HCL 0.5 % IJ SOLN
INTRAMUSCULAR | Status: DC | PRN
  Administered 2019-12-15: 30 mL

## 2019-12-15 SURGICAL SUPPLY — 43 items
APL SKNCLS STERI-STRIP NONHPOA (GAUZE/BANDAGES/DRESSINGS) ×1
BAG DECANTER FOR FLEXI CONT (MISCELLANEOUS) ×2 IMPLANT
BENZOIN TINCTURE PRP APPL 2/3 (GAUZE/BANDAGES/DRESSINGS) ×2 IMPLANT
BLADE CLIPPER SURG (BLADE) IMPLANT
CANISTER SUCT 3000ML PPV (MISCELLANEOUS) ×2 IMPLANT
CARTRIDGE OIL MAESTRO DRILL (MISCELLANEOUS) ×1 IMPLANT
COVER WAND RF STERILE (DRAPES) ×2 IMPLANT
DIFFUSER DRILL AIR PNEUMATIC (MISCELLANEOUS) ×2 IMPLANT
DRAPE LAPAROTOMY 100X72 PEDS (DRAPES) IMPLANT
DRAPE LAPAROTOMY 100X72X124 (DRAPES) IMPLANT
DRAPE SURG 17X23 STRL (DRAPES) ×8 IMPLANT
DRSG VAC ATS SM SENSATRAC (GAUZE/BANDAGES/DRESSINGS) ×1 IMPLANT
DRSG VERSA FOAM LRG 10X15 (GAUZE/BANDAGES/DRESSINGS) ×1 IMPLANT
ELECT REM PT RETURN 9FT ADLT (ELECTROSURGICAL) ×2
ELECTRODE REM PT RTRN 9FT ADLT (ELECTROSURGICAL) ×1 IMPLANT
GAUZE 4X4 16PLY RFD (DISPOSABLE) IMPLANT
GAUZE SPONGE 4X4 12PLY STRL (GAUZE/BANDAGES/DRESSINGS) ×2 IMPLANT
GLOVE ECLIPSE 6.5 STRL STRAW (GLOVE) ×1 IMPLANT
GLOVE EXAM NITRILE XL STR (GLOVE) IMPLANT
GLOVE INDICATOR 7.0 STRL GRN (GLOVE) ×2 IMPLANT
GLOVE SURG SS PI 6.5 STRL IVOR (GLOVE) ×3 IMPLANT
GLOVE SURG SS PI 7.0 STRL IVOR (GLOVE) ×1 IMPLANT
GOWN STRL REUS W/ TWL LRG LVL3 (GOWN DISPOSABLE) IMPLANT
GOWN STRL REUS W/ TWL XL LVL3 (GOWN DISPOSABLE) IMPLANT
GOWN STRL REUS W/TWL LRG LVL3 (GOWN DISPOSABLE)
GOWN STRL REUS W/TWL XL LVL3 (GOWN DISPOSABLE)
KIT BASIN OR (CUSTOM PROCEDURE TRAY) ×2 IMPLANT
KIT TURNOVER KIT B (KITS) ×2 IMPLANT
NEEDLE HYPO 22GX1.5 SAFETY (NEEDLE) ×2 IMPLANT
NS IRRIG 1000ML POUR BTL (IV SOLUTION) ×2 IMPLANT
OIL CARTRIDGE MAESTRO DRILL (MISCELLANEOUS) ×2
PACK LAMINECTOMY NEURO (CUSTOM PROCEDURE TRAY) ×2 IMPLANT
PAD ARMBOARD 7.5X6 YLW CONV (MISCELLANEOUS) ×6 IMPLANT
STRIP CLOSURE SKIN 1/2X4 (GAUZE/BANDAGES/DRESSINGS) ×2 IMPLANT
SUT VIC AB 1 CT1 18XBRD ANBCTR (SUTURE) ×1 IMPLANT
SUT VIC AB 1 CT1 8-18 (SUTURE) ×2
SUT VIC AB 2-0 CP2 18 (SUTURE) ×2 IMPLANT
SWAB COLLECTION DEVICE MRSA (MISCELLANEOUS) IMPLANT
SWAB CULTURE ESWAB REG 1ML (MISCELLANEOUS) IMPLANT
SYR CONTROL 10ML LL (SYRINGE) ×1 IMPLANT
TOWEL GREEN STERILE (TOWEL DISPOSABLE) ×2 IMPLANT
TOWEL GREEN STERILE FF (TOWEL DISPOSABLE) ×2 IMPLANT
WATER STERILE IRR 1000ML POUR (IV SOLUTION) ×2 IMPLANT

## 2019-12-15 NOTE — Transfer of Care (Signed)
Immediate Anesthesia Transfer of Care Note  Patient: Johnny [name].  Procedure(s) Performed: LUMBAR WOUND EXPLORATION (N/A Back)  Patient Location: PACU  Anesthesia Type:General  Level of Consciousness: awake  Airway & Oxygen Therapy: Patient Spontanous Breathing and Patient connected to face mask oxygen  Post-op Assessment: Report given to RN and Post -op Vital signs reviewed and stable  Post vital signs: Reviewed and stable  Last Vitals:  Vitals Value Taken Time  BP 159/117 12/15/19 2256  Temp    Pulse 75 12/15/19 2258  Resp 12 12/15/19 2258  SpO2 100 % 12/15/19 2258  Vitals shown include unvalidated device data.  Last Pain:  Vitals:   12/15/19 1943  TempSrc:   PainSc: 6       Patients Stated Pain Goal: 2 ([id number])  Complications: No apparent anesthesia complications

## 2019-12-15 NOTE — Op Note (Signed)
12/15/2019  11:01 PM  PATIENT:  Johnny [name].  48 y.o. male  PRE-OPERATIVE DIAGNOSIS:  Lumbar wound infection  POST-OPERATIVE DIAGNOSIS:  Lumbar wound infection  PROCEDURE:  Procedure(s): LUMBAR WOUND EXPLORATION  SURGEON: Surgeon(s): [name], MD  ASSISTANTS:none  ANESTHESIA:   general  EBL:  Total I/O In: 800 [I.V.:800] Out: 22 [Blood:50]  BLOOD ADMINISTERED:none  CELL SAVER GIVEN:none  COUNT:per nursing  DRAINS: none   SPECIMEN:  Source of Specimen:  wound, superficial  DICTATION: Johnny [name]. was taken to the operating room, intubated, and placed under a general anesthetic without difficulty. He was positioned prone on a Wilson frame with all pressure points properly padded. His lumbar area was prepped and draped in a sterile manner. I opened the incision with a hemostat and purulent fluid immediately drained. I cultured the fluid and samples were sent to microbiology. I opened the remaining incision with a 10 blade. I removed more purulent fluid. I then opened the fascial(thoracolumbar) closure and there was neither pus nor a csf leak. I irrigated the wound copiously. I then approximated the fascia. I did not close the suprafascial planes. I controlled bleeding from the skin edges which I trimmed to observe healthy tissue. I placed a wound vac dressing in the wound approximately 3 inches in length and one inch deep. I also infiltrated marcaine into the paraspinous musculature 30cc to control postop pain.  PLAN OF CARE: Admit to inpatient   PATIENT DISPOSITION:  PACU - hemodynamically stable.   Delay start of Pharmacological VTE agent (>24hrs) due to surgical blood loss or risk of bleeding:  yes

## 2019-12-15 NOTE — Progress Notes (Signed)
Patient ID: Johnny [name]., male   DOB: [birth year], 48 y.o.   MRN: [id number] BP 115/79 (BP Location: Right Arm)   Pulse 64   Temp 98.4 F (36.9 C) (Oral)   Resp 15   Ht 6' (1.829 m)   Wt 86.2 kg   SpO2 98%   BMI 25.77 kg/m  [name] presents with wound infection while on proper antibiotics. I will explore and debride the wound. I will consult ID post op. [name] understands and wishes to proceed. Risks and benefits were explained. To do nothing is not viable.

## 2019-12-15 NOTE — Care Management (Signed)
Placed home VAC paperwork and Medicaid DME form on chart, in case home VAC needed, will need both signed.   Magdalen Spatz RN

## 2019-12-15 NOTE — Anesthesia Procedure Notes (Signed)
Procedure Name: Intubation Date/Time: 12/15/2019 10:16 PM Performed by: Valetta Fuller, CRNA Pre-anesthesia Checklist: Patient identified, Emergency Drugs available, Suction available, Patient being monitored and Timeout performed Patient Re-evaluated:Patient Re-evaluated prior to induction Oxygen Delivery Method: Circle system utilized Preoxygenation: Pre-oxygenation with 100% oxygen Induction Type: IV induction Ventilation: Mask ventilation without difficulty Laryngoscope Size: Miller and 2 Grade View: Grade I Tube type: Oral Tube size: 7.5 mm Number of attempts: 1 Airway Equipment and Method: Stylet Placement Confirmation: ETT inserted through vocal cords under direct vision,  positive ETCO2 and breath sounds checked- equal and bilateral Secured at: 22 cm Tube secured with: Tape Dental Injury: Teeth and Oropharynx as per pre-operative assessment

## 2019-12-15 NOTE — Anesthesia Preprocedure Evaluation (Addendum)
Anesthesia Evaluation  Patient identified by MRN, date of birth, ID band Patient awake    Reviewed: Allergy & Precautions, NPO status , Patient's Chart, lab work & pertinent test results  Airway Mallampati: II  TM Distance: >3 FB Neck ROM: Full    Dental  (+) Edentulous Upper, Edentulous Lower   Pulmonary Current Smoker,    breath sounds clear to auscultation       Cardiovascular negative cardio ROS   Rhythm:Regular Rate:Normal  ECG: NSR, rate 75   Neuro/Psych negative neurological ROS     GI/Hepatic negative GI ROS, (+)     substance abuse  ,   Endo/Other  negative endocrine ROS  Renal/GU negative Renal ROS     Musculoskeletal  (+) Arthritis , Chronic neck pain Chronic pain syndrome Chronic back pain   Abdominal   Peds  Hematology negative hematology ROS (+)   Anesthesia Other Findings Lumbar wound infection  Reproductive/Obstetrics                            Anesthesia Physical Anesthesia Plan  ASA: II  Anesthesia Plan: General   Post-op Pain Management:    Induction: Intravenous  PONV Risk Score and Plan: 2 and Ondansetron, Dexamethasone, Midazolam and Treatment may vary due to age or medical condition  Airway Management Planned: Oral ETT  Additional Equipment:   Intra-op Plan:   Post-operative Plan: Extubation in OR  Informed Consent: I have reviewed the patients History and Physical, chart, labs and discussed the procedure including the risks, benefits and alternatives for the proposed anesthesia with the patient or authorized representative who has indicated his/her understanding and acceptance.       Plan Discussed with: CRNA and Anesthesiologist  Anesthesia Plan Comments:        Anesthesia Quick Evaluation

## 2019-12-16 MED ORDER — SULFAMETHOXAZOLE-TRIMETHOPRIM 800-160 MG PO TABS
1.0000 | ORAL_TABLET | Freq: Two times a day (BID) | ORAL | Status: DC
  Administered 2019-12-16 – 2019-12-17 (×3): 1 via ORAL
  Filled 2019-12-16 (×3): qty 1

## 2019-12-16 MED ORDER — DIAZEPAM 5 MG PO TABS
5.0000 mg | ORAL_TABLET | Freq: Four times a day (QID) | ORAL | Status: DC | PRN
  Administered 2019-12-16 – 2019-12-17 (×6): 5 mg via ORAL
  Filled 2019-12-16 (×6): qty 1

## 2019-12-16 MED ORDER — OXYCODONE HCL ER 15 MG PO T12A
15.0000 mg | EXTENDED_RELEASE_TABLET | Freq: Two times a day (BID) | ORAL | Status: DC
  Administered 2019-12-16 (×2): 15 mg via ORAL
  Filled 2019-12-16 (×2): qty 1

## 2019-12-16 MED ORDER — OXYCODONE HCL ER 20 MG PO T12A
20.0000 mg | EXTENDED_RELEASE_TABLET | Freq: Two times a day (BID) | ORAL | Status: DC
  Administered 2019-12-16 – 2019-12-17 (×3): 20 mg via ORAL
  Filled 2019-12-16 (×3): qty 1

## 2019-12-16 MED ORDER — KETOROLAC TROMETHAMINE 30 MG/ML IJ SOLN
30.0000 mg | Freq: Four times a day (QID) | INTRAMUSCULAR | Status: AC | PRN
  Administered 2019-12-16 – 2019-12-17 (×4): 30 mg via INTRAVENOUS
  Filled 2019-12-16 (×4): qty 1

## 2019-12-16 NOTE — TOC Initial Note (Signed)
Transition of Care (TOC) - Initial/Assessment Note    Patient Details  Name: Johnny [name]. MRN: [id number] Date of Birth: January 15, 1971  Transition of Care [facility]) CM/SW Contact:    Marilu Favre, RN Phone Number: 12/16/2019, 11:28 AM  Clinical Narrative:                 Spoke to patient at bedside. Patient from home alone. Explained Negative wound pressure system, if he goes home with it he will have a nurse three times a week to come change the dressing.  NCM will fax paperwork to [facility], if approved by Medicaid [employer] will deliver home [facility] and supplies to patient's room prior to discharge. Hospital nurse will change hospital VAC pump to home VAC pump on day of discharge. Home VAC is much smaller them hospital VAC . Patient voiced understanding.   NCM will call Dr Christella Noa to see if the home is home VAC. Home VAC papers remain in chart unsigned.   Spoke to [place] at Dr Lacy Duverney office. Explained need home VAC paper and DME Medicaid paper signed and wound measurements . In Op note 3 x 1. Need width, depth and length. Faxed paperwork to Mickel Baas at [phone] and also left copy in patient's shadow chart.  Expected Discharge Plan: [place]     Patient Goals and CMS Choice Patient states their goals for this hospitalization and ongoing recovery are:: to go home CMS Medicare.gov Compare Post Acute Care list provided to:: Patient Choice offered to / list presented to : Patient  Expected Discharge Plan and Services Expected Discharge Plan: [place]     Post Acute Care Choice: Home Health, Durable Medical Equipment Living arrangements for the past 2 months: Single Family Home                                      Prior Living Arrangements/Services Living arrangements for the past 2 months: Single Family Home Lives with:: Self Patient language and need for interpreter reviewed:: Yes          Care giver support system in place?: No  (comment)   Criminal Activity/Legal Involvement Pertinent to Current Situation/Hospitalization: No - Comment as needed  Activities of Daily Living Home Assistive Devices/Equipment: None ADL Screening (condition at time of admission) Patient's cognitive ability adequate to safely complete daily activities?: Yes Is the patient deaf or have difficulty hearing?: No Does the patient have difficulty seeing, even when wearing glasses/contacts?: No Does the patient have difficulty concentrating, remembering, or making decisions?: No Patient able to express need for assistance with ADLs?: Yes Does the patient have difficulty dressing or bathing?: No Independently performs ADLs?: Yes (appropriate for developmental age) Does the patient have difficulty walking or climbing stairs?: Yes Weakness of Legs: Both Weakness of Arms/Hands: None  Permission Sought/Granted   Permission granted to share information with : No              Emotional Assessment Appearance:: Appears stated age Attitude/Demeanor/Rapport: Angry Affect (typically observed): Blunt Orientation: : Oriented to Self, Oriented to Place, Oriented to  Time, Oriented to Situation Alcohol / Substance Use: Not Applicable Psych Involvement: No (comment)  Admission diagnosis:  Wound infection after surgery [T81.49XA] Patient Active Problem List   Diagnosis Date Noted  . Wound infection after surgery 12/06/2019  . CSF leak 11/28/2019  . Postoperative CSF leak  11/28/2019  . HNP (herniated nucleus pulposus), lumbar 11/18/2019  . Umbilical hernia without obstruction and without gangrene 10/23/2019  . Osteoarthritis of spine with radiculopathy, lumbar region 10/09/2019  . Chronic bilateral low back pain with bilateral sciatica 10/07/2019  . S/P cervical spinal fusion (C5-C6 ACDF) 07/15/2019  . Cervical radicular pain 07/15/2019  . Cervical facet joint syndrome 07/15/2019  . Degeneration of lumbar intervertebral disc 07/03/2019  .  Lumbar radicular pain 07/03/2019  . Cervical spondylosis 04/17/2019  . Homeless single person 04/17/2019  . History of total knee replacement, right 04/25/2018  . Pancreatic mass 01/14/2018  . Tobacco abuse 01/14/2018  . Chronic neck pain   . Benign neoplasm of descending colon   . Benign neoplasm of ascending colon   . Intractable vomiting with nausea   . Reflux esophagitis   . Duodenal ulcer without hemorrhage or perforation   . Chronic pain syndrome 08/07/2017  . Vitamin D deficiency 06/20/2017  . Insomnia 06/19/2017  . Right sided sciatica   . Presence of artificial knee joint 01/17/2013  . Knee pain 01/17/2013  . 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

## 2019-12-16 NOTE — Progress Notes (Signed)
Patient ID: Johnny [name]., male   DOB: Apr 03, 1971, 48 y.o.   MRN: [id number] Alert and oriented x 4 Speech is clear and fluent Moving all extremities well Continue wound vac. Will obtain id consult.

## 2019-12-16 NOTE — Progress Notes (Signed)
Patient relates none of his pain medication is working. He states Toradol has never helped him. This nurse paged neurosurgeon on call relating information

## 2019-12-16 NOTE — Anesthesia Postprocedure Evaluation (Signed)
Anesthesia Post Note  Patient: [name].  Procedure(s) Performed: LUMBAR WOUND EXPLORATION (N/A Back)     Patient location during evaluation: PACU Anesthesia Type: General Level of consciousness: awake and alert Pain management: pain level controlled Vital Signs Assessment: post-procedure vital signs reviewed and stable Respiratory status: spontaneous breathing, nonlabored ventilation, respiratory function stable and patient connected to nasal cannula oxygen Cardiovascular status: blood pressure returned to baseline and stable Postop Assessment: no apparent nausea or vomiting Anesthetic complications: no    Last Vitals:  Vitals:   12/15/19 2330 12/15/19 2345  BP:  (!) 148/104  Pulse: 69 68  Resp: 14 16  Temp:  36.9 C  SpO2: 100% 100%    Last Pain:  Vitals:   12/15/19 2345  TempSrc: Oral  PainSc: Asleep                 Trinitie Mcgirr COKER

## 2019-12-17 MED ORDER — VANCOMYCIN HCL 1500 MG/300ML IV SOLN
1500.0000 mg | Freq: Once | INTRAVENOUS | Status: AC
  Administered 2019-12-17: 1500 mg via INTRAVENOUS
  Filled 2019-12-17: qty 300

## 2019-12-17 MED ORDER — ALPRAZOLAM 0.25 MG PO TABS
0.5000 mg | ORAL_TABLET | Freq: Three times a day (TID) | ORAL | Status: DC | PRN
  Administered 2019-12-18: 0.5 mg via ORAL
  Filled 2019-12-17: qty 2

## 2019-12-17 MED ORDER — SODIUM CHLORIDE 0.9 % IV SOLN
2.0000 g | INTRAVENOUS | Status: DC
  Administered 2019-12-17: 2 g via INTRAVENOUS
  Filled 2019-12-17: qty 2
  Filled 2019-12-17: qty 20

## 2019-12-17 MED ORDER — VANCOMYCIN HCL 1250 MG/250ML IV SOLN
1250.0000 mg | Freq: Two times a day (BID) | INTRAVENOUS | Status: DC
  Administered 2019-12-18 (×2): 1250 mg via INTRAVENOUS
  Filled 2019-12-17 (×2): qty 250

## 2019-12-17 MED ORDER — DIAZEPAM 5 MG PO TABS
5.0000 mg | ORAL_TABLET | Freq: Four times a day (QID) | ORAL | Status: DC | PRN
  Administered 2019-12-18: 5 mg via ORAL
  Filled 2019-12-17: qty 1

## 2019-12-17 NOTE — Progress Notes (Signed)
Pt verbalized that he's upset about how his pain medications are not working. Pt also wanted to go out and smoke which was explained that he's not allowed to and at the same time, MD came to see and check on pt and was offered nicotine patch but pt declined.

## 2019-12-17 NOTE — Progress Notes (Signed)
Patient irate and yelling at primary nurse [place]. This nurse came in and intervened. Patient started accusing this nurse of being two hours late with his pain medication during his last admission, complaining he has no idea why he is getting IV antibiotics, and states the Dr Cyndy Freeze doesn't talk to him or explain anything new. This nurse reinforced the importance of being kind to staff, and offered assistance. Patient states penile swelling. This nurse assessed and does note swelling in the perineal area. He also states he has asked several times for the Dr to prescribe xanax. This nurse will reach out to Dr. on call and make requests known. Reinforced the importance of treating staff with dignity and respect as well as not yelling or cursing at staff.

## 2019-12-17 NOTE — Progress Notes (Signed)
Patient ID: Johnny [name]., male   DOB: [birth year], 48 y.o.   MRN: [id number] BP (!) 160/101 (BP Location: Right Arm)   Pulse 92   Temp 98.5 F (36.9 C) (Oral)   Resp 18   Ht 6' (1.829 m)   Wt 86.2 kg   SpO2 98%   BMI 25.77 kg/m  Mr. salt is alert and oriented x 4, speech is clear and fluent. While he denied the xanax last night, he would like to try it today. The valium is for muscle spasms, and he says it is ineffective for his nerves.  Wound vac is in place and operating normally.  ID has been consulted, awaiting their input.  Pharmacy was consulted, though not by myself for vancomycin dosing.  Not complaining of pain this evening. Moving all extremities well

## 2019-12-17 NOTE — Care Management (Addendum)
  VAC application signed and faxed to [facility].  Mateo Flow with [facility] has accepted referral for home health RN .    [id number] Spoke to [place] with [employer]. She is able to email paperwork to Dr Christella Noa to sign if that is better. Called Dr Christella Noa office back, spoke to [place] , she said the best way would be to fax forms to office. NCM faxed forms yesterday.     Received an order in Epic for home wound VAC. Will need Dr Lacy Duverney signature on Paper [employer] [facility] paperwork and [place] Medicaid DME form. Called office yesterday.   Called office again today spoke with Erin.She has messaged Dr Christella Noa.  Will need home health RN order for [facility] dressing changes.  Will await signature and home health orders. Entered home health orders , however will need MD to sign.  Also need full set of wound measurements.    Paperwork was faxed to Dr Lacy Duverney office yesterday and is in patient's chart.  Magdalen Spatz RN

## 2019-12-17 NOTE — Progress Notes (Signed)
Pharmacy Antibiotic Note  Johnny [name]. is a 48 y.o. male admitted on 12/14/2019 with persistent lumbar wound drainage (S/P L4-5 microdiscetomy on 11/18/19).  Pt was readmitted 12/4-12/7 for CSF repair. He presented to ED on 12/12-12/18 with drainage from lumbar wound; purulent discharge found intra-op. Cultures grew Pantoea species; pt has been receiving PO TMP/SMX from 12/20-12/23. Pt is S/P lumbar wound exploration on 12/21. Pharmacy has been consulted for vancomycin dosing for wound infection.  WBC 9.5, afebrile; Scr 1.00 (up from 0.83 in late 11/20), CrCl 99.2 ml/min  Plan: Vancomycin 1500 mg IV X 1, followed by vancomycin 1250 mg IV Q 12 hrs (estimated vancomycin AUC on this regimen, using Scr 1.0, is 464.6; goal vancomycin AUC is 400-550) Monitor WBC, temp, clinical improvement, cultures, steady-state vancomycin levels as indicated  Height: 6' (182.9 cm) Weight: 190 lb (86.2 kg) IBW/kg (Calculated) : 77.6  Temp (24hrs), Avg:98.2 F (36.8 C), Min:98.1 F (36.7 C), Max:98.4 F (36.9 C)  Recent Labs  Lab 12/14/19 1423 12/14/19 1653  WBC 9.5  --   CREATININE 1.00  --   LATICACIDVEN 1.2 3.6*    Estimated Creatinine Clearance: 99.2 mL/min (by C-G formula based on SCr of 1 mg/dL).    Allergies  Allergen Reactions  . Crab [Shellfish Allergy] Itching and Swelling  . Flexeril [Cyclobenzaprine] Anaphylaxis  . Codeine Itching  . Latex Swelling    Gloves(when worn), condoms  . Robaxin [Methocarbamol] Itching  . Sulfamethoxazole-Trimethoprim Nausea Only and Other (See Comments)    Stomach pain   . Tramadol Other (See Comments)    Antimicrobials this admission: 12/20 TMP/SMX >> 12/23 12/23 Ceftriaxone >>   Microbiology results: 12/4 MRSA PCR: negative 12/12 Lumbar purulent material (surgical cx): few Pantoea species (pan sensitive) 12/21 Lumbar surgical cx: moderate WBC present (PMNs), NOS; cx NG at 1 day  Thank you for allowing pharmacy to be a part of this  patient's care.  Gillermina Hu, PharmD, BCPS, [facility] Clinical Pharmacist 12/17/2019 4:29 PM

## 2019-12-18 ENCOUNTER — Other Ambulatory Visit: Payer: Self-pay

## 2019-12-18 ENCOUNTER — Encounter: Payer: Self-pay | Admitting: Intensive Care

## 2019-12-18 ENCOUNTER — Emergency Department: Payer: Medicaid Other

## 2019-12-18 ENCOUNTER — Emergency Department
Admission: EM | Admit: 2019-12-18 | Discharge: 2019-12-19 | Disposition: A | Discharge status: Other Acute Inpatient Hospital | Payer: Medicaid Other | Attending: Emergency Medicine | Admitting: Emergency Medicine

## 2019-12-18 DIAGNOSIS — R2241 Localized swelling, mass and lump, right lower limb: Secondary | ICD-10-CM | POA: Insufficient documentation

## 2019-12-18 DIAGNOSIS — Y658 Other specified misadventures during surgical and medical care: Secondary | ICD-10-CM | POA: Insufficient documentation

## 2019-12-18 DIAGNOSIS — R600 Localized edema: Secondary | ICD-10-CM

## 2019-12-18 DIAGNOSIS — Z96651 Presence of right artificial knee joint: Secondary | ICD-10-CM | POA: Insufficient documentation

## 2019-12-18 DIAGNOSIS — Z79899 Other long term (current) drug therapy: Secondary | ICD-10-CM | POA: Insufficient documentation

## 2019-12-18 DIAGNOSIS — T8141XA Infection following a procedure, superficial incisional surgical site, initial encounter: Secondary | ICD-10-CM | POA: Diagnosis not present

## 2019-12-18 DIAGNOSIS — F17210 Nicotine dependence, cigarettes, uncomplicated: Secondary | ICD-10-CM | POA: Insufficient documentation

## 2019-12-18 DIAGNOSIS — Z91040 Latex allergy status: Secondary | ICD-10-CM | POA: Insufficient documentation

## 2019-12-18 DIAGNOSIS — Z20828 Contact with and (suspected) exposure to other viral communicable diseases: Secondary | ICD-10-CM | POA: Insufficient documentation

## 2019-12-18 LAB — CBC WITH DIFFERENTIAL/PLATELET
Abs Immature Granulocytes: 0.06 10*3/uL (ref 0.00–0.07)
Basophils Absolute: 0.1 10*3/uL (ref 0.0–0.1)
Basophils Relative: 1 %
Eosinophils Absolute: 0.4 10*3/uL (ref 0.0–0.5)
Eosinophils Relative: 4 %
HCT: 35.3 % — ABNORMAL LOW (ref 39.0–52.0)
Hemoglobin: 12.5 g/dL — ABNORMAL LOW (ref 13.0–17.0)
Immature Granulocytes: 1 %
Lymphocytes Relative: 22 %
Lymphs Abs: 1.9 10*3/uL (ref 0.7–4.0)
MCH: 31.3 pg (ref 26.0–34.0)
MCHC: 35.4 g/dL (ref 30.0–36.0)
MCV: 88.3 fL (ref 80.0–100.0)
Monocytes Absolute: 0.8 10*3/uL (ref 0.1–1.0)
Monocytes Relative: 9 %
Neutro Abs: 5.3 10*3/uL (ref 1.7–7.7)
Neutrophils Relative %: 63 %
Platelets: 249 10*3/uL (ref 150–400)
RBC: 4 MIL/uL — ABNORMAL LOW (ref 4.22–5.81)
RDW: 12 % (ref 11.5–15.5)
WBC: 8.4 10*3/uL (ref 4.0–10.5)
nRBC: 0 % (ref 0.0–0.2)

## 2019-12-18 LAB — CREATININE, SERUM
Creatinine, Ser: 1.04 mg/dL (ref 0.61–1.24)
GFR calc Af Amer: >60 mL/min (ref >60)
GFR calc non Af Amer: >60 mL/min (ref >60)

## 2019-12-18 LAB — COMPREHENSIVE METABOLIC PANEL
ALT: 33 U/L (ref 0–44)
AST: 35 U/L (ref 15–41)
Albumin: 3.6 g/dL (ref 3.5–5.0)
Alkaline Phosphatase: 117 U/L (ref 38–126)
Anion gap: 6 (ref 5–15)
BUN: 11 mg/dL (ref 6–20)
CO2: 28 mmol/L (ref 22–32)
Calcium: 8.7 mg/dL — ABNORMAL LOW (ref 8.9–10.3)
Chloride: 102 mmol/L (ref 98–111)
Creatinine, Ser: 0.94 mg/dL (ref 0.61–1.24)
GFR calc Af Amer: >60 mL/min (ref >60)
GFR calc non Af Amer: >60 mL/min (ref >60)
Glucose, Bld: 110 mg/dL — ABNORMAL HIGH (ref 70–99)
Potassium: 4.1 mmol/L (ref 3.5–5.1)
Sodium: 136 mmol/L (ref 135–145)
Total Bilirubin: 0.4 mg/dL (ref 0.3–1.2)
Total Protein: 6.5 g/dL (ref 6.5–8.1)

## 2019-12-18 LAB — LACTIC ACID, PLASMA
Lactic Acid, Venous: 0.7 mmol/L (ref 0.5–1.9)
Lactic Acid, Venous: 0.8 mmol/L (ref 0.5–1.9)

## 2019-12-18 LAB — BRAIN NATRIURETIC PEPTIDE: B Natriuretic Peptide: 94 pg/mL (ref 0.0–100.0)

## 2019-12-18 LAB — TROPONIN I (HIGH SENSITIVITY): Troponin I (High Sensitivity): 4 ng/L (ref <18)

## 2019-12-18 LAB — FIBRIN DERIVATIVES D-DIMER (ARMC ONLY): Fibrin derivatives D-dimer (ARMC): 575.81 ng/mL (FEU) — ABNORMAL HIGH (ref 0.00–499.00)

## 2019-12-18 MED ORDER — HYDROMORPHONE HCL 1 MG/ML IJ SOLN
1.0000 mg | Freq: Once | INTRAMUSCULAR | Status: AC
  Administered 2019-12-18: 1 mg via INTRAVENOUS
  Filled 2019-12-18: qty 1

## 2019-12-18 MED ORDER — ALBUTEROL SULFATE HFA 108 (90 BASE) MCG/ACT IN AERS
2.0000 | INHALATION_SPRAY | RESPIRATORY_TRACT | Status: DC
  Filled 2019-12-18: qty 6.7

## 2019-12-18 MED ORDER — IOHEXOL 350 MG/ML SOLN
75.0000 mL | Freq: Once | INTRAVENOUS | Status: AC | PRN
  Administered 2019-12-18: 75 mL via INTRAVENOUS

## 2019-12-18 MED ORDER — SODIUM CHLORIDE 0.9 % IV SOLN
2.0000 g | Freq: Three times a day (TID) | INTRAVENOUS | Status: DC
  Filled 2019-12-18 (×2): qty 2

## 2019-12-18 NOTE — ED Notes (Signed)
On [facility] wait[facility] for transport - Johnny [name]

## 2019-12-18 NOTE — ED Triage Notes (Addendum)
First RN Note: Pt presents to ED via [facility] from home with c/o post-op pain s/p back surgery on 12/18. Per EMS pt had surgery at [facility] and was requesting to go to [facility] where surgery was performed, however EMS unable to transport to [place]. Per EMS pt also c/o R leg swelling at this time, EMS reports somewhat significant swelling to R leg, R leg warm to touch compared to L leg, no redness noted.    132/86 84 98% RA 98.1 18  Upon review of patient's chart, documentation listed as surgery at [facility], not [facility] as EMS stated.

## 2019-12-18 NOTE — ED Notes (Signed)
Pt refusing this RN to attempt IV start due to recent bad experience with IV starts. IV team messaged. Pt agrees to IV team start

## 2019-12-18 NOTE — ED Notes (Signed)
CT to [facility]hare to [facility]

## 2019-12-18 NOTE — Consult Note (Signed)
      INFECTIOUS DISEASE ATTENDING:   Date: 12/18/2019  Patient name: Johnny [name].  Medical record number: [id number]  Date of birth: [birth year]   Mr Amerman was fully dressed, maskless and leaving AMA when we tried to examine him this morning  I had started empiric ceftriaxone and IV vancomycin pending cultures last night  Based on prior culture data I would recommend DS Bactrim BID x 30 days  There was no charge for visit.  We will not see him in clinic not wearing a mask and certainly if he is belligerent that will also not be tolerated.   Alcide Evener 12/18/2019, 9:19 AM

## 2019-12-18 NOTE — ED Triage Notes (Signed)
Arrived by EMS from home for back pain. 11/19/19 back surgery. A week later 11/28/19 back surgery again for spinal fluid leakage per patient. 12/06/19 had back surgery again for pus leakage per patient. 12/14/19 back surgery again for pus leakage. Patient now presents with bilateral leg swelling and redness/swelling to testicles. Patient was told to come to ER for potential blood clots.

## 2019-12-18 NOTE — Progress Notes (Signed)
Called to patient room, when I went checked patient , patient saying he is ready to go home, asking this Probation officer to pull everything out. When asked, patient stated he is ready to go home , tired of all of these and just want to go home and take care of his animals. Encouraged patient to hold on andsee what the MD will say but he insist to go. MD on call made aware, Charge nurse aware as well as ADON.

## 2019-12-18 NOTE — Progress Notes (Signed)
DIscharge home against medical advise.

## 2019-12-18 NOTE — ED Notes (Signed)
Pt's dressing changed with EDP present. Non stick dressing and paper tape applied as pt states the stronger tape makes him itch.

## 2019-12-18 NOTE — ED Notes (Signed)
Patient transported to CT 

## 2019-12-18 NOTE — Progress Notes (Signed)
Pt was outraged from the start of shift and has been yelling and complaining that he does not have an idea why he is on antibiotics and that nothing was explained to him. Pt also asked why his doctor hasn't given him xanax and that he haven't seen his doctor. Also stated that his penis is swelling since earlier of the day. This RN tried to intervene and talk to pt but pt remained upset and cursed, charge nurse lovely came and talked to pt and made sure pt gives respect to all staff. Charge nurse reached out to MD on call around 2350, Dr. Cyndy Freeze went to see and check on pt. This RN, the charge nurse and MD went in the room together to make sure pt's concerns are addressed well so there is proper communication.

## 2019-12-18 NOTE — Progress Notes (Signed)
Patient ID: Johnny Jewett., male   DOB: 1971/02/11, 48 y.o.   MRN: [id number] Secondary to complaints that his penis was swollen I did examine him tonight. The penis is flaccid, there is no discernable mass, discoloration, or other visible abnormalities. It was not tender. I am unsure what Johnny [name] perceived but I am unable to at this time. He is urinating normally. He has not had an erection since admission.

## 2019-12-18 NOTE — ED Provider Notes (Signed)
Johnny [name]  ____________________________________________   First MD Initiated Contact with Patient 12/18/19 2005     (approximate)  I have reviewed the triage vital signs and the nursing notes.   HISTORY  Chief Complaint Leg swelling.    HPI Johnny [name]. is a 48 y.o. male who status post back surgery with Dr. Christella Noa.  Patient was admitted 11/24 for lumbar discectomy on the right side and L4-L5 semiembolectomy and discectomy.  Postop has been complicated by wound infection and CSF leak.  He was admitted on 12/12 and underwent debridement.  The bacteria was sensitive to Bactrim.  He was discharged on 30 days.  He then represented on 12/20 for continued purulent discharge but ended up leaving AMA.  Last time he was operated on was 12/21 where they did sound like another debridement and had a wound VAC put in.  However patient decided to leave  as they took the wound VAC on there is some packing inside the wound.  There is some drainage on the 4 x 4's that were initially covering the area.  He states that he was unhappy with the care there due to them not addressing his other concerns like some swelling in his legs.  He also does endorse some shortness of breath but says he does have COPD.  He is worried however that his back is still open without the wound VAC on place.  He is currently on Bactrim.  He states that he has worsening right leg weakness although when I last saw him he had right leg weakness as well.  He endorses some numbness in his rectum region but has been since his prior surgery.  He says his sciatica has gotten better from the surgery.  No urinary incontinence.  Patient also does endorse some tingling in his testicles as well  He called [facility] due to new leg swelling and painful testicles.  The [facility] neurosurgeon told to come to the ER to be evaluated for blood clots or PE.   However patient is under the impression that he will be transferred to [facility] afterwards for admission for his open back wound.           Past Medical History:  Diagnosis Date  . Arthritis    hands, knees, lower back  . Bilateral sciatica   . Carpal tunnel syndrome   . Chronic back pain   . Chronic neck pain   . Chronic pain syndrome   . GERD (gastroesophageal reflux disease)   . Hand joint pain 07/02/2013  . Herniated disc   . Traumatic amputation of left index finger 2013  . Wears dentures    full upper and lower    Patient Active Problem List   Diagnosis Date Noted  . Wound infection after surgery 12/06/2019  . CSF leak 11/28/2019  . Postoperative CSF leak 11/28/2019  . HNP (herniated nucleus pulposus), lumbar 11/18/2019  . Umbilical hernia without obstruction and without gangrene 10/23/2019  . Osteoarthritis of spine with radiculopathy, lumbar region 10/09/2019  . Chronic bilateral low back pain with bilateral sciatica 10/07/2019  . S/P cervical spinal fusion (C5-C6 ACDF) 07/15/2019  . Cervical radicular pain 07/15/2019  . Cervical facet joint syndrome 07/15/2019  . Degeneration of lumbar intervertebral disc 07/03/2019  . Lumbar radicular pain 07/03/2019  . Cervical spondylosis 04/17/2019  . Homeless single person 04/17/2019  . History of total knee replacement, right 04/25/2018  . Pancreatic mass  01/14/2018  . Tobacco abuse 01/14/2018  . Chronic neck pain   . Benign neoplasm of descending colon   . Benign neoplasm of ascending colon   . Intractable vomiting with nausea   . Reflux esophagitis   . Duodenal ulcer without hemorrhage or perforation   . Chronic pain syndrome 08/07/2017  . Vitamin D deficiency 06/20/2017  . Insomnia 06/19/2017  . Right sided sciatica   . Presence of artificial knee joint 01/17/2013  . Knee pain 01/17/2013  . 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 Traumatic amputation of finger 09/20/2012    Past Surgical History:  Procedure  Laterality Date  . AMPUTATION FINGER / THUMB Left 2013  . COLONOSCOPY WITH PROPOFOL N/A 01/03/2018   Procedure: COLONOSCOPY WITH PROPOFOL;  Surgeon: [name], MD;  Location: [place];  Service: Endoscopy;  Laterality: N/A;  . ESOPHAGOGASTRODUODENOSCOPY (EGD) WITH PROPOFOL N/A 01/03/2018   Procedure: ESOPHAGOGASTRODUODENOSCOPY (EGD) WITH PROPOFOL;  Surgeon: [name], MD;  Location: [place];  Service: Endoscopy;  Laterality: N/A;  . HERNIA REPAIR Right    right inguinal hernia repaired twice  . HIP SURGERY    . JOINT REPLACEMENT Right    knee  . KNEE SURGERY Right   . LUMBAR LAMINECTOMY/DECOMPRESSION MICRODISCECTOMY N/A 11/18/2019   Procedure: LUMBAR FOUR-FIVE LUMBAR LAMINECTOMY/DECOMPRESSION MICRODISCECTOMY;  Surgeon: [name], MD;  Location: [place];  Service: Neurosurgery;  Laterality: N/A;  . LUMBAR WOUND DEBRIDEMENT N/A 12/06/2019   Procedure: LUMBAR WOUND REVISION;  Surgeon: [name], MD;  Location: [place];  Service: Neurosurgery;  Laterality: N/A;  . neck fusion    . POLYPECTOMY N/A 01/03/2018   Procedure: POLYPECTOMY;  Surgeon: [name], MD;  Location: [place];  Service: Endoscopy;  Laterality: N/A;  . REPAIR OF CEREBROSPINAL FLUID LEAK N/A 11/28/2019   Procedure: REPAIR OF CEREBROSPINAL FLUID LEAK/LUMBAR;  Surgeon: [name], MD;  Location: [place];  Service: Neurosurgery;  Laterality: N/A;  REPAIR OF CEREBROSPINAL FLUID LEAK/LUMBAR  . REPLACEMENT TOTAL KNEE Right   . SHOULDER SURGERY Right 2016 X 2  . SPINAL FUSION    . TONSILLECTOMY    . UMBILICAL HERNIA REPAIR N/A 11/07/2019   Procedure: HERNIA REPAIR UMBILICAL ADULT;  Surgeon: [name], MD;  Location: [facility];  Service: General;  Laterality: N/A;  . WOUND EXPLORATION N/A 12/15/2019   Procedure: LUMBAR WOUND EXPLORATION;  Surgeon: [name], MD;  Location: [place];  Service: Neurosurgery;  Laterality: N/A;  LUMBAR WOUND EXPLORATION    Prior to Admission  medications   Medication Sig Start Date End Date Taking? Authorizing Provider  diphenhydramine-acetaminophen (TYLENOL PM) 25-500 MG TABS tablet Take 1 tablet by mouth at bedtime as needed (sleep).     [provider]  gabapentin (NEURONTIN) 600 MG tablet Take 2 tablets (1,200 mg total) by mouth 3 (three) times daily. Patient taking differently: Take 600 mg by mouth 3 (three) times daily. Take with 800mg  tablet for 1400mg  total 12/04/19   Johnson, Megan P, DO  gabapentin (NEURONTIN) 800 MG tablet Take 800 mg by mouth 3 (three) times daily. Take with 600 mg tablet to equal 1400 mg 3 times daily    [provider]  Oxycodone HCl 10 MG TABS Take 10 mg by mouth every 6 (six) hours as needed for pain. 12/01/19   [provider]  OXYCONTIN 15 MG 12 hr tablet Take 15 mg by mouth every 12 (twelve) hours as needed for pain. 12/02/19   [provider]  sulfamethoxazole-trimethoprim (BACTRIM)  400-80 MG tablet Take 1 tablet by mouth 2 (two) times daily. 12/12/19   [name], MD  tiZANidine (ZANAFLEX) 4 MG tablet Take 1 tablet (4 mg total) by mouth every 6 (six) hours as needed for muscle spasms. 11/18/19   [name], MD  Triamcinolone Acetonide (GOODSENSE NASAL ALLERGY SPRAY NA) Place 1 spray into the nose daily as needed (allergies).    [provider]    Allergies Otho Darner allergy], Flexeril [cyclobenzaprine], Codeine, Latex, Robaxin [methocarbamol], Sulfamethoxazole-trimethoprim, and Tramadol  Family History  Problem Relation Age of Onset  . Cancer Father        sarcoma  . Cirrhosis Paternal Grandmother   . Cancer Paternal Grandfather        Pancreatic  . Fibromyalgia Sister   . Deafness Sister     Social History Social History   Tobacco Use  . Smoking status: Current Every Day Smoker    Packs/day: 0.50    Years: 25.00    Pack years: 12.50    Types: Cigarettes  . Smokeless tobacco: Never Used  Substance Use Topics  . Alcohol use:  No  . Drug use: No      Review of Systems Constitutional: No fever/chills Eyes: No visual changes. ENT: No sore throat. Cardiovascular: Denies chest pain. Respiratory: Positive shortness of breath Gastrointestinal: No abdominal pain.  No nausea, no vomiting.  No diarrhea.  No constipation. Genitourinary: Negative for dysuria.  Discomfort in his testicles. Musculoskeletal: Positive back pain, open wound, right leg weakness, positive leg swelling Skin: Negative for rash. Neurological: Negative for headaches, positive right leg weakness  all other ROS negative ____________________________________________   PHYSICAL EXAM:  VITAL SIGNS: ED Triage Vitals  Enc Vitals Group     BP 12/18/19 1828 116/72     Pulse Rate 12/18/19 1828 (!) 103     Resp 12/18/19 1828 16     Temp 12/18/19 1828 99.1 F (37.3 C)     Temp Source 12/18/19 1828 Oral     SpO2 12/18/19 1828 94 %     Weight 12/18/19 1829 190 lb (86.2 kg)     Height 12/18/19 1829 6' (1.829 m)     Head Circumference --      Peak Flow --      Pain Score 12/18/19 1829 10     Pain Loc --      Pain Edu? --      Excl. in [place]? --     Constitutional: Alert and oriented. Well appearing and in no acute distress. Eyes: Conjunctivae are normal. EOMI. Head: Atraumatic. Nose: No congestion/rhinnorhea. Mouth/Throat: Mucous membranes are moist.   Neck: No stridor. Trachea Midline. FROM Cardiovascular: Normal rate, regular rhythm. Grossly normal heart sounds.  Good peripheral circulation. Respiratory: Mild bilateral wheezing.  No retractions.  Gastrointestinal: Soft and nontender. No distention. No abdominal bruits.  Musculoskeletal: 1+ edema bilaterally.  No joint effusions. Neurologic:  Normal speech and language. No gross focal neurologic deficits are appreciated.  Decreased rate from the right leg compared to the left leg.  Sensation intact. Skin:  Skin is warm, dry and intact. No rash noted. Psychiatric: Mood and affect are normal.  Speech and behavior are normal. GU: Normal testicles bilaterally, no erythema. Back: 2 inch open wound with some drainage from it seen on the image below with packing inside of it.  Packing was not removed.  No erythema or warmth around the outside.  Looks like a reaction to the tape that was holding the bandages on.  ____________________________________________   LABS (all labs ordered are listed, but only abnormal results are displayed)  Labs Reviewed  COMPREHENSIVE METABOLIC PANEL - Abnormal; Notable for the following components:      Result Value   Glucose, Bld 110 (*)    Calcium 8.7 (*)    All other components within normal limits  CBC WITH DIFFERENTIAL/PLATELET - Abnormal; Notable for the following components:   RBC 4.00 (*)    Hemoglobin 12.5 (*)    HCT 35.3 (*)    All other components within normal limits  FIBRIN DERIVATIVES D-DIMER ([facility] ONLY) - Abnormal; Notable for the following components:   Fibrin derivatives D-dimer ([facility]) 575.81 (*)    All other components within normal limits  LACTIC ACID, PLASMA  LACTIC ACID, PLASMA   ____________________________________________   ED ECG REPORT I, Vanessa Hanford, the attending physician, personally viewed and interpreted this ECG.   ____________________________________________  RADIOLOGY   Official radiology report(s): CT Angio Chest PE W and/or Wo Contrast  Result Date: 12/18/2019 CLINICAL DATA:  Shortness of breath and back pain. Back surgery 11/19/2019. Subsequent CSF leak. Question pulmonary embolism. EXAM: CT ANGIOGRAPHY CHEST WITH CONTRAST TECHNIQUE: Multidetector CT imaging of the chest was performed using the standard protocol during bolus administration of intravenous contrast. Multiplanar CT image reconstructions and MIPs were obtained to evaluate the vascular anatomy. CONTRAST:  47mL OMNIPAQUE IOHEXOL 350 MG/ML SOLN COMPARISON:  Radiographs 04/01/2019.  Abdominal CT 12/26/2017. FINDINGS: Cardiovascular: The  pulmonary arteries are well opacified with contrast to the level of the subsegmental branches. There is no evidence of acute pulmonary embolism. No significant systemic arterial abnormalities. The heart size is normal. There is no pericardial effusion. Mediastinum/Nodes: Scattered prominent mediastinal lymph nodes, including a 9 mm AP window node on image 34/4 and subcarinal nodes measuring 9 mm on image 41/4 and 9 mm on image 52/4. No axillary or hilar adenopathy. The thyroid gland, trachea and esophagus demonstrate no significant findings. Lungs/Pleura: There is no pleural effusion or pneumothorax. There is mild subsegmental atelectasis at both lung bases and in the right middle lobe. No confluent airspace opacity. Upper abdomen: The visualized upper abdomen appears stable with mild pancreatic ductal dilatation, similar to previous abdominal CT from 12/26/2017. No surrounding inflammatory changes. The pancreas is incompletely visualized. Musculoskeletal/Chest wall: There is no chest wall mass or suspicious osseous finding. Previous lower cervical fusion. Review of the MIP images confirms the above findings. IMPRESSION: 1. No evidence of acute pulmonary embolism or other acute chest process. 2. Mild subsegmental atelectasis at both lung bases. 3. Nonspecific mildly prominent mediastinal lymph nodes, likely reactive. 4. Prominent pancreatic duct, unchanged from previous CT 12/26/2017. Electronically Signed   By: Richardean Sale M.D.   On: 12/18/2019 21:43   US Venous Img Lower Bilateral  Result Date: 12/18/2019 CLINICAL DATA:  Leg swelling for 3 days EXAM: BILATERAL LOWER EXTREMITY VENOUS DOPPLER ULTRASOUND TECHNIQUE: Gray-scale sonography with graded compression, as well as color Doppler and duplex ultrasound were performed to evaluate the lower extremity deep venous systems from the level of the common femoral vein and including the common femoral, femoral, profunda femoral, popliteal and calf veins including  the posterior tibial, peroneal and gastrocnemius veins when visible. Spectral Doppler was utilized to evaluate flow at rest and with distal augmentation maneuvers in the common femoral, femoral and popliteal veins. COMPARISON:  Right lower extremity ultrasound July 29, 2012 FINDINGS: RIGHT LOWER EXTREMITY Common Femoral Vein: No evidence of thrombus. Normal compressibility, respiratory phasicity and response to augmentation. Saphenofemoral  Junction: No evidence of thrombus. Normal compressibility and flow on color Doppler imaging. Profunda Femoral Vein: No evidence of thrombus. Normal compressibility and flow on color Doppler imaging. Femoral Vein: No evidence of thrombus. Normal compressibility, respiratory phasicity and response to augmentation. Popliteal Vein: No evidence of thrombus. Normal compressibility, respiratory phasicity and response to augmentation. Calf Veins: No evidence of thrombus. Normal compressibility and flow on color Doppler imaging. Venous Reflux:  None. Other Findings:  Mild soft tissue edema. LEFT LOWER EXTREMITY Common Femoral Vein: No evidence of thrombus. Normal compressibility, respiratory phasicity and response to augmentation. Saphenofemoral Junction: No evidence of thrombus. Normal compressibility and flow on color Doppler imaging. Profunda Femoral Vein: No evidence of thrombus. Normal compressibility and flow on color Doppler imaging. Femoral Vein: No evidence of thrombus. Normal compressibility, respiratory phasicity and response to augmentation. Popliteal Vein: No evidence of thrombus. Normal compressibility, respiratory phasicity and response to augmentation. Calf Veins: No evidence of thrombus. Normal compressibility and flow on color Doppler imaging. Venous Reflux:  None. Other Findings:  Mild soft tissue edema. IMPRESSION: No evidence of deep venous thrombosis in either lower extremity. Soft tissue edema, most pronounced towards the calves. Electronically Signed   By: Lovena Le M.D.   On: 12/18/2019 20:58    ____________________________________________   PROCEDURES  Procedure(s) performed (including Critical Care):  Procedures   ____________________________________________   INITIAL IMPRESSION / ASSESSMENT AND PLAN / ED COURSE  Johnny [name]. was evaluated in Emergency Department on 12/18/2019 for the symptoms described in the history of present illness. He was evaluated in the context of the global COVID-19 pandemic, which necessitated consideration that the patient might be at risk for infection with the SARS-CoV-2 virus that causes COVID-19. Institutional protocols and algorithms that pertain to the evaluation of patients at risk for COVID-19 are in a state of rapid change based on information released by regulatory bodies including the CDC and federal and state organizations. These policies and algorithms were followed during the patient's care in the ED.    Patient is a 48 year old who has a very complicated neurosurgical history over at [facility] who comes in with increasing leg swelling.  Will get labs to evaluate for DVT PE.  D-dimer was elevated so we will get ultrasound.  Get labs to evaluate for heart failure.  She states that shortness of breath is very minimal and I suspect that it is more likely from his COPD given he does have some minimal wheezing but given he is recently postop with elevated D-dimer we will get a CT PE to rule out pulmonary embolism.  CT PE without evidence of PE.  We will give him an inhaler since he is not been taking his.  I suspect that his leg swelling is mildly from some chronic venous stasis from being in the hospital so frequently.   The second concern is his open back wound with prior CSF leak as well as wound infection.  There is clearly some current drainage on the dressing that was covering the wound.  I did not remove the packing at this time but will discuss with neurosurgery for further recommendations.   A lot of his right leg weakness in his tingling sensation in his bottom seems more chronic since his surgery however I will discuss with them if we need to do any other imaging.  I discussed with patient he would prefer that I talked to the [facility] neurosurgery team and hopefully be transferred over to [facility].  Discussed  with Dr. Izora Ribas.  Obviously this is a complicated situation given patient is currently a [facility] patient however patient is refusing to get further care there.  At this time patient will be transferred over to the [facility] ED they will evaluate him there for the next steps.  We can hold off imaging at this time.  ____________________________________________   FINAL CLINICAL IMPRESSION(S) / ED DIAGNOSES   Final diagnoses:  Infection of superficial incisional surgical site after procedure, initial encounter  Leg edema      MEDICATIONS GIVEN DURING THIS VISIT:  Medications  albuterol (VENTOLIN HFA) 108 (90 Base) MCG/ACT inhaler 2 puff (has no administration in time range)  HYDROmorphone (DILAUDID) injection 1 mg (has no administration in time range)  iohexol (OMNIPAQUE) 350 MG/ML injection 75 mL (75 mLs Intravenous Contrast Given 12/18/19 2119)  HYDROmorphone (DILAUDID) injection 1 mg (1 mg Intravenous Given 12/18/19 2113)     ED Discharge Orders    None       [name]:  This document was prepared using Dragon voice recognition software and may include unintentional dictation errors.   Vanessa , MD 12/18/19 2257

## 2019-12-18 NOTE — Care Management (Signed)
Patient left AMA this morning. [facility] VAC was discontinued prior to patient leaving. Spoke with Olivia Mackie with [facility] and cancelled order for home [facility] V[facility]. Spoke to Dr Christella Noa on phone. Will ask Mateo Flow with [facility] if they can still attempt to see patient at home for wet to dry dressing changes. Spoke with Mateo Flow she will check branch to see if they can still accept. Await call back.

## 2019-12-18 NOTE — Care Management (Signed)
Patient has left AMA. VAC was removed prior to patient discharging. Spoke with Johnny [name] with [facility], [place] home VAC.  Spoke with Dr Christella Noa who wanted to see if home health could still see patient for wet to dry dressing changes.   Spoke with Mateo Flow with [facility] , since patient left AMA home health is not able to see patient. Will message Dr Christella Noa.     Magdalen Spatz RN

## 2019-12-18 NOTE — Progress Notes (Signed)
Hooked pt's IV Vancomycin at 0509 in the morning, later then received a call from pt that he messed up the antibiotics when he woke up and moved around. This RN came to check pt and IV antibiotics was indeed all over the floor, pharmacy was notified and was told that they're gonna send another bag. Will continue to monitor pt with remaining of shift.

## 2019-12-18 NOTE — ED Notes (Signed)
Pt to be transferred to [facility] ED- Main

## 2019-12-19 DIAGNOSIS — T8130XA Disruption of wound, unspecified, initial encounter: Secondary | ICD-10-CM | POA: Insufficient documentation

## 2019-12-19 LAB — RESPIRATORY PANEL BY RT PCR (FLU A&B, COVID)
Influenza A by PCR: NEGATIVE
Influenza B by PCR: NEGATIVE
SARS Coronavirus 2 by RT PCR: NEGATIVE

## 2019-12-19 LAB — TROPONIN I (HIGH SENSITIVITY): Troponin I (High Sensitivity): 7 ng/L (ref <18)

## 2019-12-19 NOTE — ED Notes (Signed)
Pt signed consent for transfer, report was called by previous RN. VSS, patient NAD. [facility] to transport

## 2019-12-19 NOTE — ED Notes (Signed)
EMTALA and Medical Necessity documentation reviewed at this time and found to be complete per policy. 

## 2019-12-19 NOTE — ED Notes (Signed)
Patient waiting for transport, no complaints at present

## 2019-12-21 LAB — AEROBIC/ANAEROBIC CULTURE W GRAM STAIN (SURGICAL/DEEP WOUND)

## 2019-12-22 ENCOUNTER — Telehealth: Payer: Self-pay | Admitting: Family Medicine

## 2019-12-22 NOTE — Telephone Encounter (Signed)
It appears that patient is admitted to the hospital- can we confirm this

## 2019-12-22 NOTE — Telephone Encounter (Signed)
Patient is admitted at [facility].

## 2019-12-22 NOTE — Telephone Encounter (Signed)
Copied from [place] [id number]. Topic: General - Inquiry >> Dec 18, 2019 11:53 AM Scherrie Gerlach wrote: Reason for CRM:  pt would like a call back to discuss getting a different surgeon .  He states he did not want Dr Christella Noa to operate on him, and that is who they sent. Pt left [facility] today from [facility].  Pt called this am to get a ride home from [facility] hospital and I had transferred him to [facility] transportation. Pt aware dr may be gone today, and it may be Monday before he gets a return call.  Pt verbalized uderstanding.

## 2019-12-22 NOTE — Telephone Encounter (Signed)
° ° °  Returning phone call from landlord stating that he had not received check payment, [place] [employer] ??[email or web address]   ??[id number]

## 2019-12-25 MED ORDER — SENNOSIDES-DOCUSATE SODIUM 8.6-50 MG PO TABS
1.00 | ORAL_TABLET | ORAL | Status: DC
Start: 2019-12-25 — End: 2019-12-25

## 2019-12-25 MED ORDER — BISACODYL 10 MG RE SUPP
10.00 | RECTAL | Status: DC
Start: ? — End: 2019-12-25

## 2019-12-25 MED ORDER — GENERIC EXTERNAL MEDICATION
Status: DC
Start: ? — End: 2019-12-25

## 2019-12-25 MED ORDER — GENERIC EXTERNAL MEDICATION
1.75 | Status: DC
Start: 2019-12-25 — End: 2019-12-25

## 2019-12-25 MED ORDER — SIMETHICONE 80 MG PO CHEW
80.00 | CHEWABLE_TABLET | ORAL | Status: DC
Start: ? — End: 2019-12-25

## 2019-12-25 MED ORDER — GABAPENTIN 400 MG PO CAPS
800.00 | ORAL_CAPSULE | ORAL | Status: DC
Start: 2019-12-25 — End: 2019-12-25

## 2019-12-25 MED ORDER — LIDOCAINE HCL 1 % IJ SOLN
0.50 | INTRAMUSCULAR | Status: DC
Start: ? — End: 2019-12-25

## 2019-12-25 MED ORDER — CARBOXYMETHYLCELLULOSE SODIUM 0.5 % OP SOLN
1.00 | OPHTHALMIC | Status: DC
Start: ? — End: 2019-12-25

## 2019-12-25 MED ORDER — ACETAMINOPHEN 325 MG PO TABS
975.00 | ORAL_TABLET | ORAL | Status: DC
Start: 2019-12-25 — End: 2019-12-25

## 2019-12-25 MED ORDER — ENOXAPARIN SODIUM 40 MG/0.4ML ~~LOC~~ SOLN
40.00 | SUBCUTANEOUS | Status: DC
Start: 2019-12-26 — End: 2019-12-25

## 2019-12-25 MED ORDER — HYDROMORPHONE HCL 1 MG/ML IJ SOLN
0.50 | INTRAMUSCULAR | Status: DC
Start: ? — End: 2019-12-25

## 2019-12-25 MED ORDER — TIZANIDINE HCL 4 MG PO TABS
4.00 | ORAL_TABLET | ORAL | Status: DC
Start: 2019-12-25 — End: 2019-12-25

## 2019-12-25 MED ORDER — GENERIC EXTERNAL MEDICATION
750.00 | Status: DC
Start: 2019-12-25 — End: 2019-12-25

## 2019-12-25 MED ORDER — POLYETHYLENE GLYCOL 3350 17 GM/SCOOP PO POWD
17.00 | ORAL | Status: DC
Start: 2019-12-26 — End: 2019-12-25

## 2019-12-25 MED ORDER — FAMOTIDINE 20 MG PO TABS
20.00 | ORAL_TABLET | ORAL | Status: DC
Start: 2019-12-25 — End: 2019-12-25

## 2019-12-25 MED ORDER — MULTI-VITAMIN PO TABS
1.00 | ORAL_TABLET | ORAL | Status: DC
Start: 2019-12-26 — End: 2019-12-25

## 2019-12-25 MED ORDER — OXYCODONE HCL 5 MG PO TABS
5.00 | ORAL_TABLET | ORAL | Status: DC
Start: ? — End: 2019-12-25

## 2019-12-25 MED ORDER — TRAZODONE HCL 50 MG PO TABS
50.00 | ORAL_TABLET | ORAL | Status: DC
Start: ? — End: 2019-12-25

## 2019-12-25 NOTE — Discharge Summary (Signed)
Physician Discharge Summary  Patient ID: Johnny [name]. MRN: [id number] DOB/AGE: July 23, 1971 48 y.o.  Admit date: 12/14/2019 Discharge date: 12/25/2019  Admission Diagnoses:wound infection  Discharge Diagnoses: same Active Problems:   Wound infection after surgery   Discharged Condition: unknown, patient left ama, on his last exam he had a wound vac, and was afebrile  Hospital Course: Johnny [name] was admitted due to drainage from his wound. He was taken to the operating room had his wound debrided. I placed a wound vac and he was admitted for IV antibiotics and an ID consult. He decided he would not stay at the hospital and left against medical advice.   Treatments: surgery: as Above  Discharge Exam: Blood pressure 111/78, pulse 71, temperature 98.6 F (37 C), temperature source Oral, resp. rate 18, height 6' (1.829 m), weight 86.2 kg, SpO2 96 %. left ama  Disposition:  There are no questions and answers to display.       Lumbar wound infection  Allergies as of 12/18/2019      Reactions   Crab [shellfish Allergy] Itching, Swelling   Flexeril [cyclobenzaprine] Anaphylaxis   Codeine Itching   Latex Swelling   Gloves(when worn), condoms   Robaxin [methocarbamol] Itching   Sulfamethoxazole-trimethoprim Nausea Only, Other (See Comments)   Stomach pain    Tramadol Other (See Comments)      Medication List    ASK your doctor about these medications   diphenhydramine-acetaminophen 25-500 MG Tabs tablet Commonly known as: TYLENOL PM Take 1 tablet by mouth at bedtime as needed (sleep).   gabapentin 800 MG tablet Commonly known as: NEURONTIN Take 800 mg by mouth 3 (three) times daily. Take with 600 mg tablet to equal 1400 mg 3 times daily   gabapentin 600 MG tablet Commonly known as: NEURONTIN Take 2 tablets (1,200 mg total) by mouth 3 (three) times daily.   GOODSENSE NASAL ALLERGY SPRAY NA Place 1 spray into the nose daily as needed (allergies).    Oxycodone HCl 10 MG Tabs Take 10 mg by mouth every 6 (six) hours as needed for pain.   OxyCONTIN 15 mg 12 hr tablet Generic drug: oxyCODONE Take 15 mg by mouth every 12 (twelve) hours as needed for pain.   sulfamethoxazole-trimethoprim 400-80 MG tablet Commonly known as: BACTRIM Take 1 tablet by mouth 2 (two) times daily.   tiZANidine 4 MG tablet Commonly known as: ZANAFLEX Take 1 tablet (4 mg total) by mouth every 6 (six) hours as needed for muscle spasms.      Follow-up [place], [facility] Follow up.   Contact information: [place] [address] [phone]           Signed: [name] 12/25/2019, 12:13 PM

## 2019-12-29 ENCOUNTER — Telehealth: Payer: Medicaid Other

## 2019-12-30 MED ORDER — ALUM & MAG HYDROXIDE-SIMETH 400-400-40 MG/5ML PO SUSP
30.00 | ORAL | Status: DC
Start: ? — End: 2019-12-30

## 2019-12-30 MED ORDER — LIDOCAINE HCL 1 % IJ SOLN
0.50 | INTRAMUSCULAR | Status: DC
Start: ? — End: 2019-12-30

## 2019-12-30 MED ORDER — GABAPENTIN 300 MG PO CAPS
600.00 | ORAL_CAPSULE | ORAL | Status: DC
Start: 2019-12-30 — End: 2019-12-30

## 2019-12-30 MED ORDER — GENERIC EXTERNAL MEDICATION
Status: DC
Start: ? — End: 2019-12-30

## 2019-12-30 MED ORDER — SENNOSIDES-DOCUSATE SODIUM 8.6-50 MG PO TABS
1.00 | ORAL_TABLET | ORAL | Status: DC
Start: 2019-12-30 — End: 2019-12-30

## 2019-12-30 MED ORDER — ACETAMINOPHEN 325 MG PO TABS
975.00 | ORAL_TABLET | ORAL | Status: DC
Start: 2019-12-30 — End: 2019-12-30

## 2019-12-30 MED ORDER — TIZANIDINE HCL 4 MG PO TABS
4.00 | ORAL_TABLET | ORAL | Status: DC
Start: ? — End: 2019-12-30

## 2019-12-30 MED ORDER — OXYCODONE HCL 5 MG PO TABS
5.00 | ORAL_TABLET | ORAL | Status: DC
Start: ? — End: 2019-12-30

## 2019-12-30 MED ORDER — RA PROBIOTIC DIGESTIVE CARE PO CAPS
1.00 | ORAL_CAPSULE | ORAL | Status: DC
Start: 2019-12-31 — End: 2019-12-30

## 2019-12-30 MED ORDER — GENERIC EXTERNAL MEDICATION
5.00 | Status: DC
Start: 2019-12-30 — End: 2019-12-30

## 2019-12-30 MED ORDER — POLYETHYLENE GLYCOL 3350 17 GM/SCOOP PO POWD
17.00 | ORAL | Status: DC
Start: 2019-12-31 — End: 2019-12-30

## 2019-12-30 MED ORDER — GENERIC EXTERNAL MEDICATION
750.00 | Status: DC
Start: 2019-12-30 — End: 2019-12-30

## 2019-12-30 MED ORDER — DIPHENHYDRAMINE HCL 25 MG PO CAPS
25.00 | ORAL_CAPSULE | ORAL | Status: DC
Start: ? — End: 2019-12-30

## 2019-12-30 MED ORDER — GENERIC EXTERNAL MEDICATION
1.75 | Status: DC
Start: 2019-12-30 — End: 2019-12-30

## 2020-01-04 ENCOUNTER — Other Ambulatory Visit
Admission: RE | Admit: 2020-01-04 | Discharge: 2020-01-04 | Disposition: A | Discharge status: Home / Self Care | Payer: Medicaid Other | Attending: Orthopedic Surgery | Admitting: Orthopedic Surgery

## 2020-01-04 DIAGNOSIS — M4636 Infection of intervertebral disc (pyogenic), lumbar region: Secondary | ICD-10-CM | POA: Diagnosis present

## 2020-01-04 LAB — BASIC METABOLIC PANEL
Anion gap: 12 (ref 5–15)
BUN: 7 mg/dL (ref 6–20)
CO2: 22 mmol/L (ref 22–32)
Calcium: 8.9 mg/dL (ref 8.9–10.3)
Chloride: 107 mmol/L (ref 98–111)
Creatinine, Ser: 1.07 mg/dL (ref 0.61–1.24)
GFR calc Af Amer: >60 mL/min (ref >60)
GFR calc non Af Amer: >60 mL/min (ref >60)
Glucose, Bld: 98 mg/dL (ref 70–99)
Potassium: 3.8 mmol/L (ref 3.5–5.1)
Sodium: 141 mmol/L (ref 135–145)

## 2020-01-04 LAB — CBC WITH DIFFERENTIAL/PLATELET
Abs Immature Granulocytes: 0.02 10*3/uL (ref 0.00–0.07)
Basophils Absolute: 0.1 10*3/uL (ref 0.0–0.1)
Basophils Relative: 1 %
Eosinophils Absolute: 0.2 10*3/uL (ref 0.0–0.5)
Eosinophils Relative: 2 %
HCT: 37.3 % — ABNORMAL LOW (ref 39.0–52.0)
Hemoglobin: 12.7 g/dL — ABNORMAL LOW (ref 13.0–17.0)
Immature Granulocytes: 0 %
Lymphocytes Relative: 32 %
Lymphs Abs: 2.4 10*3/uL (ref 0.7–4.0)
MCH: 31.1 pg (ref 26.0–34.0)
MCHC: 34 g/dL (ref 30.0–36.0)
MCV: 91.2 fL (ref 80.0–100.0)
Monocytes Absolute: 0.7 10*3/uL (ref 0.1–1.0)
Monocytes Relative: 10 %
Neutro Abs: 4.2 10*3/uL (ref 1.7–7.7)
Neutrophils Relative %: 55 %
Platelets: 349 10*3/uL (ref 150–400)
RBC: 4.09 MIL/uL — ABNORMAL LOW (ref 4.22–5.81)
RDW: 12.4 % (ref 11.5–15.5)
WBC: 7.6 10*3/uL (ref 4.0–10.5)
nRBC: 0 % (ref 0.0–0.2)

## 2020-01-04 LAB — VANCOMYCIN, TROUGH: Vancomycin Tr: 29 ug/mL (ref 15–20)

## 2020-01-07 ENCOUNTER — Encounter: Payer: Self-pay | Admitting: Emergency Medicine

## 2020-01-07 ENCOUNTER — Emergency Department: Payer: Medicaid Other

## 2020-01-07 ENCOUNTER — Emergency Department
Admission: EM | Admit: 2020-01-07 | Discharge: 2020-01-08 | Disposition: A | Discharge status: Home / Self Care | Payer: Medicaid Other | Attending: Emergency Medicine | Admitting: Emergency Medicine

## 2020-01-07 ENCOUNTER — Other Ambulatory Visit: Payer: Self-pay

## 2020-01-07 DIAGNOSIS — Z91040 Latex allergy status: Secondary | ICD-10-CM | POA: Insufficient documentation

## 2020-01-07 DIAGNOSIS — G8918 Other acute postprocedural pain: Secondary | ICD-10-CM | POA: Diagnosis present

## 2020-01-07 DIAGNOSIS — Z79899 Other long term (current) drug therapy: Secondary | ICD-10-CM | POA: Diagnosis not present

## 2020-01-07 DIAGNOSIS — Z96651 Presence of right artificial knee joint: Secondary | ICD-10-CM | POA: Diagnosis not present

## 2020-01-07 DIAGNOSIS — F17210 Nicotine dependence, cigarettes, uncomplicated: Secondary | ICD-10-CM | POA: Insufficient documentation

## 2020-01-07 LAB — COMPREHENSIVE METABOLIC PANEL
ALT: 31 U/L (ref 0–44)
AST: 26 U/L (ref 15–41)
Albumin: 4.2 g/dL (ref 3.5–5.0)
Alkaline Phosphatase: 109 U/L (ref 38–126)
Anion gap: 11 (ref 5–15)
BUN: 6 mg/dL (ref 6–20)
CO2: 25 mmol/L (ref 22–32)
Calcium: 9 mg/dL (ref 8.9–10.3)
Chloride: 103 mmol/L (ref 98–111)
Creatinine, Ser: 1.06 mg/dL (ref 0.61–1.24)
GFR calc Af Amer: >60 mL/min (ref >60)
GFR calc non Af Amer: >60 mL/min (ref >60)
Glucose, Bld: 108 mg/dL — ABNORMAL HIGH (ref 70–99)
Potassium: 3.8 mmol/L (ref 3.5–5.1)
Sodium: 139 mmol/L (ref 135–145)
Total Bilirubin: 0.6 mg/dL (ref 0.3–1.2)
Total Protein: 7.2 g/dL (ref 6.5–8.1)

## 2020-01-07 LAB — CBC WITH DIFFERENTIAL/PLATELET
Abs Immature Granulocytes: 0.03 10*3/uL (ref 0.00–0.07)
Basophils Absolute: 0.1 10*3/uL (ref 0.0–0.1)
Basophils Relative: 1 %
Eosinophils Absolute: 0.6 10*3/uL — ABNORMAL HIGH (ref 0.0–0.5)
Eosinophils Relative: 7 %
HCT: 38.4 % — ABNORMAL LOW (ref 39.0–52.0)
Hemoglobin: 13.3 g/dL (ref 13.0–17.0)
Immature Granulocytes: 0 %
Lymphocytes Relative: 32 %
Lymphs Abs: 2.7 10*3/uL (ref 0.7–4.0)
MCH: 31 pg (ref 26.0–34.0)
MCHC: 34.6 g/dL (ref 30.0–36.0)
MCV: 89.5 fL (ref 80.0–100.0)
Monocytes Absolute: 0.7 10*3/uL (ref 0.1–1.0)
Monocytes Relative: 8 %
Neutro Abs: 4.3 10*3/uL (ref 1.7–7.7)
Neutrophils Relative %: 52 %
Platelets: 307 10*3/uL (ref 150–400)
RBC: 4.29 MIL/uL (ref 4.22–5.81)
RDW: 12.4 % (ref 11.5–15.5)
WBC: 8.3 10*3/uL (ref 4.0–10.5)
nRBC: 0 % (ref 0.0–0.2)

## 2020-01-07 LAB — LACTIC ACID, PLASMA: Lactic Acid, Venous: 1.1 mmol/L (ref 0.5–1.9)

## 2020-01-07 LAB — VANCOMYCIN, RANDOM: Vancomycin Rm: 16

## 2020-01-07 LAB — SEDIMENTATION RATE: Sed Rate: 8 mm/hr (ref 0–15)

## 2020-01-07 LAB — C-REACTIVE PROTEIN: CRP: 1 mg/dL — ABNORMAL HIGH (ref <1.0)

## 2020-01-07 MED ORDER — OXYCODONE HCL 10 MG PO TABS: 10.0000 mg | ORAL_TABLET | Freq: Four times a day (QID) | ORAL | 0 refills | Status: DC | PRN

## 2020-01-07 MED ORDER — HEPARIN SOD (PORK) LOCK FLUSH 10 UNIT/ML IV SOLN
10.0000 [IU] | Freq: Once | INTRAVENOUS | Status: AC
  Administered 2020-01-07: 10 [IU]
  Filled 2020-01-07: qty 1

## 2020-01-07 MED ORDER — HYDROMORPHONE HCL 1 MG/ML IJ SOLN
1.0000 mg | Freq: Once | INTRAMUSCULAR | Status: AC
  Administered 2020-01-07: 1 mg via INTRAVENOUS
  Filled 2020-01-07: qty 1

## 2020-01-07 MED ORDER — KETOROLAC TROMETHAMINE 30 MG/ML IJ SOLN
30.0000 mg | Freq: Once | INTRAMUSCULAR | Status: AC
  Administered 2020-01-07: 30 mg via INTRAVENOUS
  Filled 2020-01-07: qty 1

## 2020-01-07 MED ORDER — CIPROFLOXACIN IN D5W 400 MG/200ML IV SOLN
400.0000 mg | Freq: Once | INTRAVENOUS | Status: AC
  Administered 2020-01-07: 20:00:00 400 mg via INTRAVENOUS
  Filled 2020-01-07: qty 200

## 2020-01-07 MED ORDER — VANCOMYCIN HCL 1750 MG/350ML IV SOLN
1750.0000 mg | Freq: Once | INTRAVENOUS | Status: AC
  Administered 2020-01-07: 1750 mg via INTRAVENOUS
  Filled 2020-01-07: qty 350

## 2020-01-07 MED ORDER — GADOBUTROL 1 MMOL/ML IV SOLN
8.0000 mL | Freq: Once | INTRAVENOUS | Status: AC | PRN
  Administered 2020-01-07: 8 mL via INTRAVENOUS

## 2020-01-07 MED ORDER — OXYCODONE HCL 5 MG PO TABS
15.0000 mg | ORAL_TABLET | Freq: Once | ORAL | Status: AC
  Administered 2020-01-07: 15 mg via ORAL
  Filled 2020-01-07: qty 3

## 2020-01-07 NOTE — ED Notes (Signed)
Report given to Sarah RN

## 2020-01-07 NOTE — ED Notes (Signed)
EDP at bedside updating pt on current status

## 2020-01-07 NOTE — ED Notes (Signed)
Pt given leftover dinner tray and sprite. Pt reports abx cause stomach upset if he does not eat

## 2020-01-07 NOTE — ED Notes (Signed)
Pt states he does not have a ride due to the change of plans (not being transferred to [facility]).

## 2020-01-07 NOTE — ED Triage Notes (Signed)
Says back pain 1 month ago at [facility].  Says last 2 days redness and swelling at incision site.  His doctor told him tocome here and be transferred to [facility].  Says he has picc line and is already on IV antibiotics.  Also increase in pain. No fever.

## 2020-01-07 NOTE — ED Notes (Addendum)
Pt speaking with this RN in NAD, pt reports back swelling, redness and heat x 2 days. Pt requesting something for pain, states he has been taking 10mg  oxycodone q3hrs for pain without relief. Pt reports he needs to get to [facility] quick because he is due for abx and cannot miss any doses

## 2020-01-07 NOTE — ED Provider Notes (Addendum)
[facility] Emergency Department Provider Note  ____________________________________________   First MD Initiated Contact with Patient 01/07/20 1827     (approximate)  I have reviewed the triage vital signs and the nursing notes.   HISTORY  Chief Complaint Back Pain and Post-op Problem    HPI Johnny [name]. is a 49 y.o. male  Here with significant lower back pain. Pt recently underwent surgery at [facility], s/p multiple revisions for infected prosthesis, on Vanc/Cipro. Most recent surgery was 12/25 at [facility]. He states that he has been recovering fairly well, until his pain has acutely worsened over the past 2 days. Denies any specific trauma or injuries. He states that over past 2 days, his pain, particularly just to the R of the incision, has been increasing with some occasional shooting pain down R leg, worse sitting up. No new weakness, numbness. No loss of bowel or bladder continence. No fevers. He feels like the area around his incision is more swollen as well, which is new. No drainage from his op site. He has been adherent with his ABX regimen.    Past Medical History:  Diagnosis Date  . Arthritis    hands, knees, lower back  . Bilateral sciatica   . Carpal tunnel syndrome   . Chronic back pain   . Chronic neck pain   . Chronic pain syndrome   . GERD (gastroesophageal reflux disease)   . Hand joint pain 07/02/2013  . Herniated disc   . Traumatic amputation of left index finger 2013  . Wears dentures    full upper and lower    Patient Active Problem List   Diagnosis Date Noted  . Wound infection after surgery 12/06/2019  . CSF leak 11/28/2019  . Postoperative CSF leak 11/28/2019  . HNP (herniated nucleus pulposus), lumbar 11/18/2019  . Umbilical hernia without obstruction and without gangrene 10/23/2019  . Osteoarthritis of spine with radiculopathy, lumbar region 10/09/2019  . Chronic bilateral low back pain with bilateral sciatica  10/07/2019  . S/P cervical spinal fusion (C5-C6 ACDF) 07/15/2019  . Cervical radicular pain 07/15/2019  . Cervical facet joint syndrome 07/15/2019  . Degeneration of lumbar intervertebral disc 07/03/2019  . Lumbar radicular pain 07/03/2019  . Cervical spondylosis 04/17/2019  . Homeless single person 04/17/2019  . History of total knee replacement, right 04/25/2018  . Pancreatic mass 01/14/2018  . Tobacco abuse 01/14/2018  . Chronic neck pain   . Benign neoplasm of descending colon   . Benign neoplasm of ascending colon   . Intractable vomiting with nausea   . Reflux esophagitis   . Duodenal ulcer without hemorrhage or perforation   . Chronic pain syndrome 08/07/2017  . Vitamin D deficiency 06/20/2017  . Insomnia 06/19/2017  . Right sided sciatica   . Presence of artificial knee joint 01/17/2013  . Knee pain 01/17/2013  . 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 AMPUTATION FINGER / THUMB Left 2013  . COLONOSCOPY WITH PROPOFOL N/A 01/03/2018   Procedure: COLONOSCOPY WITH PROPOFOL;  Surgeon: [name], MD;  Location: [place];  Service: Endoscopy;  Laterality: N/A;  . ESOPHAGOGASTRODUODENOSCOPY (EGD) WITH PROPOFOL N/A 01/03/2018   Procedure: ESOPHAGOGASTRODUODENOSCOPY (EGD) WITH PROPOFOL;  Surgeon: [name], MD;  Location: [place];  Service: Endoscopy;  Laterality: N/A;  . HERNIA REPAIR Right    right inguinal hernia repaired twice  . HIP SURGERY    .  JOINT REPLACEMENT Right    knee  . KNEE SURGERY Right   . LUMBAR LAMINECTOMY/DECOMPRESSION MICRODISCECTOMY N/A 11/18/2019   Procedure: LUMBAR FOUR-FIVE LUMBAR LAMINECTOMY/DECOMPRESSION MICRODISCECTOMY;  Surgeon: [name], MD;  Location: [place];  Service: Neurosurgery;  Laterality: N/A;  . LUMBAR WOUND DEBRIDEMENT N/A 12/06/2019   Procedure: LUMBAR WOUND REVISION;  Surgeon: [name], MD;   Location: [place];  Service: Neurosurgery;  Laterality: N/A;  . neck fusion    . POLYPECTOMY N/A 01/03/2018   Procedure: POLYPECTOMY;  Surgeon: [name], MD;  Location: [place];  Service: Endoscopy;  Laterality: N/A;  . REPAIR OF CEREBROSPINAL FLUID LEAK N/A 11/28/2019   Procedure: REPAIR OF CEREBROSPINAL FLUID LEAK/LUMBAR;  Surgeon: [name], MD;  Location: [place];  Service: Neurosurgery;  Laterality: N/A;  REPAIR OF CEREBROSPINAL FLUID LEAK/LUMBAR  . REPLACEMENT TOTAL KNEE Right   . SHOULDER SURGERY Right 2016 X 2  . SPINAL FUSION    . TONSILLECTOMY    . UMBILICAL HERNIA REPAIR N/A 11/07/2019   Procedure: HERNIA REPAIR UMBILICAL ADULT;  Surgeon: [name], MD;  Location: [facility];  Service: General;  Laterality: N/A;  . WOUND EXPLORATION N/A 12/15/2019   Procedure: LUMBAR WOUND EXPLORATION;  Surgeon: [name], MD;  Location: [place];  Service: Neurosurgery;  Laterality: N/A;  LUMBAR WOUND EXPLORATION    Prior to Admission medications   Medication Sig Start Date End Date Taking? Authorizing Provider  diphenhydramine-acetaminophen (TYLENOL PM) 25-500 MG TABS tablet Take 1 tablet by mouth at bedtime as needed (sleep).     [provider]  gabapentin (NEURONTIN) 600 MG tablet Take 2 tablets (1,200 mg total) by mouth 3 (three) times daily. Patient taking differently: Take 600 mg by mouth 3 (three) times daily. Take with '800mg'$  tablet for '1400mg'$  total 12/04/19   Johnson, Megan P, DO  gabapentin (NEURONTIN) 800 MG tablet Take 800 mg by mouth 3 (three) times daily. Take with 600 mg tablet to equal 1400 mg 3 times daily    [provider]  oxyCODONE 10 MG TABS Take 1 tablet (10 mg total) by mouth every 6 (six) hours as needed for severe pain. 01/07/20   Duffy Bruce, MD  sulfamethoxazole-trimethoprim (BACTRIM) 400-80 MG tablet Take 1 tablet by mouth 2 (two) times daily. 12/12/19   [name], MD  tiZANidine (ZANAFLEX) 4 MG tablet Take 1 tablet (4 mg  total) by mouth every 6 (six) hours as needed for muscle spasms. 11/18/19   [name], MD  Triamcinolone Acetonide (GOODSENSE NASAL ALLERGY SPRAY NA) Place 1 spray into the nose daily as needed (allergies).    [provider]    Allergies Otho Darner allergy], Flexeril [cyclobenzaprine], Codeine, Latex, Robaxin [methocarbamol], Sulfamethoxazole-trimethoprim, and Tramadol  Family History  Problem Relation Age of Onset  . Cancer Father        sarcoma  . Cirrhosis Paternal Grandmother   . Cancer Paternal Grandfather        Pancreatic  . Fibromyalgia Sister   . Deafness Sister     Social History Social History   Tobacco Use  . Smoking status: Current Every Day Smoker    Packs/day: 0.50    Years: 25.00    Pack years: 12.50    Types: Cigarettes  . Smokeless tobacco: Never Used  Substance Use Topics  . Alcohol use: No  . Drug use: No    Review of Systems  Review of Systems  Constitutional: Negative for chills and fever.  HENT: Negative for sore throat.  Respiratory: Negative for shortness of breath.   Cardiovascular: Negative for chest pain.  Gastrointestinal: Negative for abdominal pain.  Genitourinary: Negative for flank pain.  Musculoskeletal: Positive for arthralgias, back pain and gait problem. Negative for neck pain.  Skin: Negative for rash and wound.  Allergic/Immunologic: Negative for immunocompromised state.  Neurological: Negative for weakness and numbness.  Hematological: Does not bruise/bleed easily.     ____________________________________________  PHYSICAL EXAM:      VITAL SIGNS: ED Triage Vitals  Enc Vitals Group     BP 01/07/20 1243 (!) 168/110     Pulse Rate 01/07/20 1243 87     Resp 01/07/20 1243 14     Temp 01/07/20 1243 98.7 F (37.1 C)     Temp Source 01/07/20 1243 Oral     SpO2 01/07/20 1243 98 %     Weight 01/07/20 1244 189 lb 13.1 oz (86.1 kg)     Height 01/07/20 1244 6' (1.829 m)     Head Circumference --      Peak  Flow --      Pain Score 01/07/20 1243 10     Pain Loc --      Pain Edu? --      Excl. in [place]? --      Physical Exam Vitals and nursing note reviewed.  Constitutional:      General: He is not in acute distress.    Appearance: He is well-developed.  HENT:     Head: Normocephalic and atraumatic.  Eyes:     Conjunctiva/sclera: Conjunctivae normal.  Cardiovascular:     Rate and Rhythm: Normal rate and regular rhythm.     Heart sounds: Normal heart sounds.  Pulmonary:     Effort: Pulmonary effort is normal. No respiratory distress.     Breath sounds: No wheezing.  Abdominal:     General: There is no distension.  Musculoskeletal:     Cervical back: Neck supple.     Comments: Midline surgical incision on lower back is c/d/i. There is mild edema noted at the site but no erythema, drainage. Minimal warmth noted. No streaking. No dehiscence.  Skin:    General: Skin is warm.     Capillary Refill: Capillary refill takes less than 2 seconds.     Findings: No rash.  Neurological:     Mental Status: He is alert and oriented to person, place, and time.     Motor: No abnormal muscle tone.     Comments: Strength 5/5 bilateral LE, able to ambulate, normal sensation to light touch       ____________________________________________   LABS (all labs ordered are listed, but only abnormal results are displayed)  Labs Reviewed  COMPREHENSIVE METABOLIC PANEL - Abnormal; Notable for the following components:      Result Value   Glucose, Bld 108 (*)    All other components within normal limits  CBC WITH DIFFERENTIAL/PLATELET - Abnormal; Notable for the following components:   HCT 38.4 (*)    Eosinophils Absolute 0.6 (*)    All other components within normal limits  C-REACTIVE PROTEIN - Abnormal; Notable for the following components:   CRP 1.0 (*)    All other components within normal limits  CULTURE, BLOOD (ROUTINE X 2)  CULTURE, BLOOD (ROUTINE X 2)  SEDIMENTATION RATE  LACTIC ACID,  PLASMA  VANCOMYCIN, RANDOM    ____________________________________________  EKG: None ________________________________________  RADIOLOGY All imaging, including plain films, CT scans, and ultrasounds, independently reviewed by me, and interpretations confirmed  via formal radiology reads.  ED MD interpretation:   MRI L Spine: Post-op seroma, no complications, no signs of active or new infection, no new fx  Official radiology report(s): MR Lumbar Spine W Wo Contrast  Result Date: 01/07/2020 CLINICAL DATA:  Initial evaluation for acute low back pain, history of recent surgery 3 weeks ago. EXAM: MRI LUMBAR SPINE WITHOUT AND WITH CONTRAST TECHNIQUE: Multiplanar and multiecho pulse sequences of the lumbar spine were obtained without and with intravenous contrast. CONTRAST:  60m GADAVIST GADOBUTROL 1 MMOL/ML IV SOLN COMPARISON:  Comparison made with previous MRI from 11/22/2019. FINDINGS: Segmentation: Standard. Lowest well-formed disc space labeled the L5-S1 level. Alignment:  Physiologic. Vertebrae: Vertebral body height maintained without evidence for acute or interval fracture. Bone marrow signal intensity somewhat diffusely decreased on T1 weighted imaging, nonspecific, but most commonly related to anemia, smoking, or obesity. No discrete or worrisome osseous lesions. Mild reactive marrow edema and enhancement about the L4-5 interspace extending about the right L4-5 facet favored to be reactive and/or degenerative, similar to previous. No evidence for osteomyelitis discitis. Conus medullaris and cauda equina: Conus extends to the T12-L1 level. Conus and cauda equina appear normal. Paraspinal and other soft tissues: Postoperative changes from recent repeat right laminotomy with micro discectomy at L4-5. Small collection involving the subcutaneous fat along the right paramidline incision measures 2.2 x 4.0 x 5.3 cm (series 11, image 29). Additional smaller collection at the laminectomy defect measures  1.8 x 0.9 x 1.4 cm. Findings favored to reflect benign postoperative seromas. Normal expected postoperative edema and enhancement seen within the adjacent paraspinous soft tissues. Paraspinous soft tissues otherwise within normal limits. Disc levels: T12-L1: Mild annular disc bulge with disc desiccation.  No stenosis. L1-2:  Unremarkable. L2-3: Mild annular disc bulging. Mild facet degeneration. No canal or foraminal stenosis. L3-4: Chronic intervertebral disc space narrowing with diffuse disc bulge and disc desiccation. Mild facet hypertrophy. Small central annular fissure. No significant spinal stenosis. Mild bilateral L3 foraminal narrowing. L4-5: Chronic intervertebral disc space narrowing with diffuse disc bulge and disc desiccation. Mild reactive endplate changes. Postoperative changes from recent right laminotomy with micro discectomy. Postoperative enhancing granulation tissue surrounds the descending right L5 nerve root in the right lateral recess. No residual or recurrent disc herniation. No adverse features. Thecal sac widely patent without stenosis. Moderate right L4 foraminal narrowing, stable. L5-S1: Chronic intervertebral disc space narrowing with diffuse disc bulge and disc desiccation. Reactive endplate changes with marginal endplate osteophytic spurring. Mild facet degeneration. No spinal stenosis. Moderate left L5 foraminal narrowing, stable. IMPRESSION: 1. Postoperative changes from recent right laminotomy with micro discectomy without adverse features. No residual or recurrent disc herniation. 2. 2 small collections along the right paramidline incision and at the laminotomy defect as above, felt to be most compatible with benign postoperative seromas. 3. Additional mild degenerative spondylosis elsewhere within the lumbar spine, stable. Electronically Signed   By: BJeannine BogaM.D.   On: 01/07/2020 23:03    ____________________________________________  PROCEDURES   Procedure(s)  performed (including Critical Care):  Procedures  ____________________________________________  INITIAL IMPRESSION / [place] / [place]/ ED COURSE  As part of my medical decision making, I reviewed the following data within the [place] reviewed and incorporated, Old chart reviewed, Notes from prior ED visits, and [place] Controlled Substance Database       *PJadakiss Barish was evaluated in Emergency Department on 01/08/2020 for the symptoms described in the history of present illness.  He was evaluated in the context of the global COVID-19 pandemic, which necessitated consideration that the patient might be at risk for infection with the SARS-CoV-2 virus that causes COVID-19. Institutional protocols and algorithms that pertain to the evaluation of patients at risk for COVID-19 are in a state of rapid change based on information released by regulatory bodies including the CDC and federal and state organizations. These policies and algorithms were followed during the patient's care in the ED.  Some ED evaluations and interventions may be delayed as a result of limited staffing during the pandemic.*    Medical Decision Making:  49 yo M here with ongoing significant R back pain, s/p multiple recent surgeries on IV Vanc/Cipro. Fortunately, the operative site appears clean, dry, with no erythema or objective evidence of infection and pt has normal WBC, normal ESR, and no signs to suggest ongoing infection. MRI obtained given his complex history and recent surgery and shows no acute inflammation or post-op complication. No new fx or disc herniations. Discussed case with NSGY on call at [place] [facility] (had dw Dr. Linus Mako prior to arrival, [place] resident), who feels that with pain control, pt can safely f/u as outpatient which I think is reasonable. Pt does have significant ongoing pain and recently had his oxycodone dose decreased from 15 TID to 5 TID. He did have a short  course called in for increased dosage but he is highly concerned about his ongoing pain and inability to ambulate due to this. I think it's reasonable to have him go back to 15 mg TID as he had done previously. Will give him a very short course of 10 mg tablets to add to his 5 mg tablets given his recent surgery with acute worsening of pain in setting of recently decreasing meds, with strict instructions to call his pain specialist tomorrow to discuss ongoing management and ideally, weaning of opiates to prevent dependence.  ADDENDUM: 1/14 - Notified that pt already had a script that hd not yet been filled. I asked them to cancel/discontinue the short-term script provided, to prevent double prescribing and reduce MEDD. Script not filled. ____________________________________________  FINAL CLINICAL IMPRESSION(S) / ED DIAGNOSES  Final diagnoses:  Post-operative pain     MEDICATIONS GIVEN DURING THIS VISIT:  Medications  heparin flush 10 UNIT/ML injection 10 Units (10 Units Intracatheter Given 01/07/20 1307)  vancomycin (VANCOREADY) IVPB 1750 mg/350 mL (0 mg Intravenous Stopped 01/07/20 2336)  HYDROmorphone (DILAUDID) injection 1 mg (1 mg Intravenous Given 01/07/20 1910)  ciprofloxacin (CIPRO) IVPB 400 mg (0 mg Intravenous Stopped 01/07/20 2130)  ketorolac (TORADOL) 30 MG/ML injection 30 mg (30 mg Intravenous Given 01/07/20 2041)  HYDROmorphone (DILAUDID) injection 1 mg (1 mg Intravenous Given 01/07/20 2056)  gadobutrol (GADAVIST) 1 MMOL/ML injection 8 mL (8 mLs Intravenous Contrast Given 01/07/20 2150)  oxyCODONE (Oxy IR/ROXICODONE) immediate release tablet 15 mg (15 mg Oral Given 01/07/20 2334)     ED Discharge Orders         Ordered    oxyCODONE 10 MG TABS  Every 6 hours PRN     01/07/20 2333           Note:  This document was prepared using Dragon voice recognition software and may include unintentional dictation errors.   Duffy Bruce, MD 01/08/20 4536    Duffy Bruce,  MD 01/08/20 1258

## 2020-01-07 NOTE — ED Notes (Signed)
Pt otf for MRI 

## 2020-01-07 NOTE — ED Notes (Signed)
Pt lying in bed c/o increased back pain after lying down flat after the MRI, (8 on a 0-10 scale). Pt exhibiting no signs of acute distress. RR equal and unlabored at this time.

## 2020-01-07 NOTE — ED Triage Notes (Signed)
Pt in via Johnny [name] from home with c/o back pain. [name] reports pt had back surgery at [facility] 3 weeks ago. Pt with PICC line in place and some swelling and possible infection to it. Pts MD at [facility] advised pt to come to the ED for possible infection.

## 2020-01-07 NOTE — Discharge Instructions (Signed)
For your pain:  If you need to go back to 15 mg, I've prescribed a very short course of 10 mg.   You can take the 10 mg tablet with the 5 mg tablets prescribed to you, every 6 hours as needed. For moderate pain, take one 10 mg tab. For mild pain, take 1 5 mg tab.

## 2020-01-08 ENCOUNTER — Telehealth: Payer: Self-pay | Admitting: Family Medicine

## 2020-01-08 ENCOUNTER — Other Ambulatory Visit (HOSPITAL_COMMUNITY)
Admission: RE | Admit: 2020-01-08 | Discharge: 2020-01-08 | Disposition: A | Discharge status: Home / Self Care | Payer: Medicaid Other | Source: Other Acute Inpatient Hospital | Attending: Orthopedic Surgery | Admitting: Orthopedic Surgery

## 2020-01-08 DIAGNOSIS — X58XXXA Exposure to other specified factors, initial encounter: Secondary | ICD-10-CM | POA: Diagnosis not present

## 2020-01-08 DIAGNOSIS — T8130XA Disruption of wound, unspecified, initial encounter: Secondary | ICD-10-CM | POA: Diagnosis not present

## 2020-01-08 LAB — BASIC METABOLIC PANEL
Anion gap: 12 (ref 5–15)
BUN: 8 mg/dL (ref 6–20)
CO2: 27 mmol/L (ref 22–32)
Calcium: 9.2 mg/dL (ref 8.9–10.3)
Chloride: 100 mmol/L (ref 98–111)
Creatinine, Ser: 1.06 mg/dL (ref 0.61–1.24)
GFR calc Af Amer: >60 mL/min (ref >60)
GFR calc non Af Amer: >60 mL/min (ref >60)
Glucose, Bld: 93 mg/dL (ref 70–99)
Potassium: 4 mmol/L (ref 3.5–5.1)
Sodium: 139 mmol/L (ref 135–145)

## 2020-01-08 LAB — VANCOMYCIN, TROUGH: Vancomycin Tr: 18 ug/mL (ref 15–20)

## 2020-01-08 LAB — CBC WITH DIFFERENTIAL/PLATELET
Abs Immature Granulocytes: 0.03 10*3/uL (ref 0.00–0.07)
Basophils Absolute: 0.1 10*3/uL (ref 0.0–0.1)
Basophils Relative: 1 %
Eosinophils Absolute: 0.4 10*3/uL (ref 0.0–0.5)
Eosinophils Relative: 6 %
HCT: 38.6 % — ABNORMAL LOW (ref 39.0–52.0)
Hemoglobin: 13.1 g/dL (ref 13.0–17.0)
Immature Granulocytes: 0 %
Lymphocytes Relative: 30 %
Lymphs Abs: 2.4 10*3/uL (ref 0.7–4.0)
MCH: 31.8 pg (ref 26.0–34.0)
MCHC: 33.9 g/dL (ref 30.0–36.0)
MCV: 93.7 fL (ref 80.0–100.0)
Monocytes Absolute: 0.7 10*3/uL (ref 0.1–1.0)
Monocytes Relative: 9 %
Neutro Abs: 4.4 10*3/uL (ref 1.7–7.7)
Neutrophils Relative %: 54 %
Platelets: 306 10*3/uL (ref 150–400)
RBC: 4.12 MIL/uL — ABNORMAL LOW (ref 4.22–5.81)
RDW: 12.6 % (ref 11.5–15.5)
WBC: 8 10*3/uL (ref 4.0–10.5)
nRBC: 0 % (ref 0.0–0.2)

## 2020-01-08 NOTE — Telephone Encounter (Signed)
Patient notified that we can not do a PA on this medication, since this was written by an ER doctor.

## 2020-01-08 NOTE — Telephone Encounter (Signed)
Pt went to [facility] er last night and dr Lysbeth Galas isaac prescribed oxycodone and that med needs PA . Pt  called er and the recording said he  can not speak to any nurse or md.  [facility]

## 2020-01-09 ENCOUNTER — Other Ambulatory Visit (HOSPITAL_COMMUNITY)
Admit: 2020-01-09 | Discharge: 2020-01-09 | Disposition: A | Discharge status: Home / Self Care | Payer: Medicaid Other | Source: Ambulatory Visit | Attending: Orthopedic Surgery | Admitting: Orthopedic Surgery

## 2020-01-09 DIAGNOSIS — M4636 Infection of intervertebral disc (pyogenic), lumbar region: Secondary | ICD-10-CM | POA: Diagnosis not present

## 2020-01-09 LAB — C DIFFICILE QUICK SCREEN W PCR REFLEX
C Diff antigen: NEGATIVE
C Diff interpretation: NOT DETECTED
C Diff toxin: NEGATIVE

## 2020-01-09 LAB — CREATININE, SERUM
Creatinine, Ser: 1.07 mg/dL (ref 0.61–1.24)
GFR calc Af Amer: >60 mL/min (ref >60)
GFR calc non Af Amer: >60 mL/min (ref >60)

## 2020-01-11 ENCOUNTER — Other Ambulatory Visit (HOSPITAL_COMMUNITY)
Admission: RE | Admit: 2020-01-11 | Discharge: 2020-01-11 | Disposition: A | Discharge status: Home / Self Care | Payer: Medicaid Other | Source: Other Acute Inpatient Hospital | Attending: Orthopedic Surgery | Admitting: Orthopedic Surgery

## 2020-01-11 DIAGNOSIS — M4636 Infection of intervertebral disc (pyogenic), lumbar region: Secondary | ICD-10-CM | POA: Diagnosis not present

## 2020-01-11 LAB — CBC WITH DIFFERENTIAL/PLATELET
Abs Immature Granulocytes: 0.04 10*3/uL (ref 0.00–0.07)
Basophils Absolute: 0 10*3/uL (ref 0.0–0.1)
Basophils Relative: 0 %
Eosinophils Absolute: 0.1 10*3/uL (ref 0.0–0.5)
Eosinophils Relative: 2 %
HCT: 41.5 % (ref 39.0–52.0)
Hemoglobin: 14 g/dL (ref 13.0–17.0)
Immature Granulocytes: 0 %
Lymphocytes Relative: 25 %
Lymphs Abs: 2.2 10*3/uL (ref 0.7–4.0)
MCH: 31.3 pg (ref 26.0–34.0)
MCHC: 33.7 g/dL (ref 30.0–36.0)
MCV: 92.8 fL (ref 80.0–100.0)
Monocytes Absolute: 0.7 10*3/uL (ref 0.1–1.0)
Monocytes Relative: 8 %
Neutro Abs: 5.8 10*3/uL (ref 1.7–7.7)
Neutrophils Relative %: 65 %
Platelets: 301 10*3/uL (ref 150–400)
RBC: 4.47 MIL/uL (ref 4.22–5.81)
RDW: 12.9 % (ref 11.5–15.5)
WBC: 9 10*3/uL (ref 4.0–10.5)
nRBC: 0 % (ref 0.0–0.2)

## 2020-01-11 LAB — BASIC METABOLIC PANEL
Anion gap: 10 (ref 5–15)
BUN: 10 mg/dL (ref 6–20)
CO2: 24 mmol/L (ref 22–32)
Calcium: 9.2 mg/dL (ref 8.9–10.3)
Chloride: 102 mmol/L (ref 98–111)
Creatinine, Ser: 0.9 mg/dL (ref 0.61–1.24)
GFR calc Af Amer: >60 mL/min (ref >60)
GFR calc non Af Amer: >60 mL/min (ref >60)
Glucose, Bld: 101 mg/dL — ABNORMAL HIGH (ref 70–99)
Potassium: 3.8 mmol/L (ref 3.5–5.1)
Sodium: 136 mmol/L (ref 135–145)

## 2020-01-11 LAB — VANCOMYCIN, TROUGH: Vancomycin Tr: 16 ug/mL (ref 15–20)

## 2020-01-12 ENCOUNTER — Ambulatory Visit: Payer: Self-pay | Admitting: General Practice

## 2020-01-12 ENCOUNTER — Telehealth: Payer: Self-pay | Admitting: Family Medicine

## 2020-01-12 DIAGNOSIS — Z5900 Homelessness unspecified: Secondary | ICD-10-CM

## 2020-01-12 LAB — CULTURE, BLOOD (ROUTINE X 2)
Culture: NO GROWTH
Culture: NO GROWTH
Specimen Description: ADEQUATE
Specimen Description: ADEQUATE

## 2020-01-12 NOTE — Telephone Encounter (Signed)
    Housing: Called pt Programmer, applications Referral for emergency housing. [place] helped pt with paying rent previously and pt is wanting to move from his rented apt in [place]. He stated that he received his $700 stimulus payment and gave it to his landlord for payment on a truck and he has paid to put two new tires on the truck as well. Pt is still waiting to hear back from landlord about when the truck will be ready for him (it was in the shop) so he is stuck because he lives too far out to call a cab and his friends haven't been willing to help take him to get things he needs. His next door neighbor has been taking him to pick up groceries and run errands. Concerned about house being built next door that may be a "drug house"  I discussed housing options in [place] and pt stated that he would have to fill out an application and was not willing to do that. He is feeling desperate and wants to move his cell service on his phone is lousy where he is living and really wants to be closer into [place]. Stated that he lived in a homeless shelter many years ago and was able to get into housing quickly, was discussing perhaps moving into a shelter so that he could get into housing and I stated that Jerene Pitch would discuss that with him on their call on Wed.  Transportation: He is worried about appts coming up in [facility] where [place] will not travel outside of the county so he cannot get to them. Has one appt at [facility] he is concerned about as well.  Called DSS to find out about Art gallery manager for Medicaid pts who are traveling out of the county and they were closed for [facility] holiday. Will try again on 01/13/20   I discussed housing options in [place] and pt stated that he would have to fill out an application and was not willing to do that. He is feeling desperate and wants to move his cell service on his phone is lousy  where he is living and really wants to be closer into [place].  LCSW is scheduled to call him on 1/20 and will route this encounter so she is aware of our conversation.   [place] . Embedded Care Coordination [place]  Care Management ??[email or web address]  ??[phone]

## 2020-01-12 NOTE — Patient Instructions (Signed)
Mr. Halliday as promised I have notified Curt Bears and also Chief Financial Officer that you need help with housing and transportation. As I offered on the phone if I can be of further assistance to you please reach out to me.  Noreene Larsson RN, MSN, [facility] Mobile: [phone]

## 2020-01-12 NOTE — Chronic Care Management (AMB) (Signed)
°   Care Management   Note  01/12/2020 Name: Johnny [name]. MRN: [id number] DOB: 10-Feb-1971  Call received from [name] requesting assistance with contact information for our care guide team.  Care guide team referral made and LCSW notified of patient request as she is actively working with this patient.   Follow up plan: Telephone follow up appointment with care management team member scheduled for: 01-14-20 (pharmacy)  Noreene Larsson RN, MSN, [facility] Mobile: [phone]

## 2020-01-13 NOTE — Telephone Encounter (Signed)
LM for DSS Patty Bristow - [facility] transportation is closed until next week due to [place] lm to find out other options for pt

## 2020-01-13 NOTE — Telephone Encounter (Signed)
Notified pt that DSS for [place] would not be available to provide transportation for his upcoming appts as he now resides in [place]. Gave pt the # for [place] [phone] and he stated that he would call to be set up for future trips.  Closing referral pending any other needs of patient.  [place] . Embedded Care Coordination [place]  Care Management ??[email or web address]  ??[phone]

## 2020-01-13 NOTE — ED Provider Notes (Signed)
EKG my interpretation:  12/24 Normal sinus, st elev more likely repol given multiple leads no reciprocal changes. Normal intervals, no t wave inversions.   Vanessa Millard, MD 01/13/20 [phone]

## 2020-01-14 ENCOUNTER — Ambulatory Visit: Payer: Medicaid Other | Admitting: Pharmacist

## 2020-01-14 DIAGNOSIS — M792 Neuralgia and neuritis, unspecified: Secondary | ICD-10-CM

## 2020-01-14 DIAGNOSIS — G894 Chronic pain syndrome: Secondary | ICD-10-CM

## 2020-01-14 DIAGNOSIS — M5431 Sciatica, right side: Secondary | ICD-10-CM

## 2020-01-14 NOTE — Patient Instructions (Signed)
Visit Information  Goals Addressed            This Visit's Progress     Patient Stated   . PharmD "I'm worried about my pain" (pt-stated)       Current Barriers:  . Polypharmacy; complex patient with multiple comorbidities including chronic pain, multiple recent surgeries w/ post operative CSF leak, osteoarthritis . Financial concerns: reports that he found a new housing arrangement that is cheaper and likely safer, and he should be moving in the next few weeks. 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 o Biggest concern is that his personal truck needs some repairs, and he does not have the money. He would try to do the repairs himself, but worried that his physical limitations would prevent him from doing that. Notes some recent legal expenses, as his license expired while he was in the hospital in December . Self-manages medications.  . Notes he is in significant pain today. Marin Olp, PA at [facility] managing his pain medications. He notes he was just reduced to oxycodone 5 mg, 1-2 tabs Q6H, and he notes that he cannot make it 6 hours. Continues gabapentin 1200 mg BID, tizanidine PRN o Apt w/ Dr. Quay Burow (ID) on 1/25. Notes that he has a PIC line, getting vanc outpatient BID and ciprofloxacin PO, which is causing significant stomach upset.  o Apt w/ Dr. Cari Caraway 2/4. Notes pain management appointment scheduled 02/18/20.   Pharmacist Clinical Goal(s):  Marland Kitchen Over the next 90 days, patient will work with PharmD and provider towards optimized medication management  Interventions: . Comprehensive medication review performed; medication list updated in electronic medical record . Reviewed to contact Coralie Keens at [facility] if he feels this regimen does not adequately control his pain, before he establishes w/ pain management . Will collaborate w/ Care Guide to see if any resources available to help with vehicle repair costs . Reiterated phone number to contact for [facility] transportation, as he does not have a functioning vehicle or license. He is to call them to set up transportation for his ID appointment next week.  Patient Self Care Activities:  . Patient will take medications as prescribed  Please see past updates related to this goal by clicking on the "Past Updates" button in the selected goal          The patient verbalized understanding of instructions provided today and declined a print copy of patient instruction materials.    Plan:  - Will collaborate w/ CCM team as above - Scheduled f/u call 02/25/20  Catie Darnelle Maffucci, PharmD, [place] [phone]

## 2020-01-14 NOTE — Chronic Care Management (AMB) (Signed)
Chronic Care Management   Follow Up Note   01/14/2020 Name: Robie Vititoe. MRN: [id number] DOB: 04-16-1971  Referred by: Valerie Roys, DO Reason for referral : Chronic Care Management (Medication Management)   [name] Lyndon Loos. is a 49 y.o. year old male who is a primary care patient of Valerie Roys, DO. The CCM team was consulted for assistance with chronic disease management and care coordination needs.    Contacted patient for medication management review.   Review of patient status, including review of consultants reports, relevant laboratory and other test results, and collaboration with appropriate care team members and the patient's provider was performed as part of comprehensive patient evaluation and provision of chronic care management services.    SDOH (Social Determinants of Health) screening performed today: Financial Strain  Stress. See Care Plan for related entries.   Outpatient Encounter Medications as of 01/14/2020  Medication Sig  . ciprofloxacin (CIPRO) 750 MG tablet Take 750 mg by mouth 2 (two) times daily.  Marland Kitchen gabapentin (NEURONTIN) 600 MG tablet Take 2 tablets (1,200 mg total) by mouth 3 (three) times daily. (Patient taking differently: Take 600 mg by mouth 3 (three) times daily. Take with 800mg  tablet for 1400mg  total)  . oxyCODONE 10 MG TABS Take 1 tablet (10 mg total) by mouth every 6 (six) hours as needed for severe pain. (Patient taking differently: Take 10 mg by mouth every 6 (six) hours as needed for severe pain. Per Coralie Keens at [facility])  . tiZANidine (ZANAFLEX) 4 MG tablet Take 1 tablet (4 mg total) by mouth every 6 (six) hours as needed for muscle spasms.  Marland Kitchen VANCOMYCIN HCL IV Inject 1.75 g into the vein every 12 (twelve) hours.  . diphenhydramine-acetaminophen (TYLENOL PM) 25-500 MG TABS tablet Take 1 tablet by mouth at bedtime as needed (sleep).   . gabapentin (NEURONTIN) 800 MG tablet Take 800 mg by mouth 3 (three) times daily. Take with 600 mg  tablet to equal 1400 mg 3 times daily  . Triamcinolone Acetonide (GOODSENSE NASAL ALLERGY SPRAY NA) Place 1 spray into the nose daily as needed (allergies).  . [DISCONTINUED] sulfamethoxazole-trimethoprim (BACTRIM) 400-80 MG tablet Take 1 tablet by mouth 2 (two) times daily.   No facility-administered encounter medications on file as of 01/14/2020.     Objective:   Goals Addressed            This Visit's Progress     Patient Stated   . PharmD "I'm worried about my pain" (pt-stated)       Current Barriers:  . Polypharmacy; complex patient with multiple comorbidities including chronic pain, multiple recent surgeries w/ post operative CSF leak, osteoarthritis . Financial concerns: reports that he found a new housing arrangement that is cheaper and likely safer, and he should be moving in the next few weeks. 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 o Biggest concern is that his personal truck needs some repairs, and he does not have the money. He would try to do the repairs himself, but worried that his physical limitations would prevent him from doing that. Notes some recent legal expenses, as his license expired while he was in the hospital in December . Self-manages medications.  . Notes he is in significant pain today. Marin Olp, PA at [facility] managing his pain medications. He notes he was just reduced to oxycodone 5 mg, 1-2 tabs Q6H, and he notes that he cannot make it  6 hours. Continues gabapentin 1200 mg BID, tizanidine PRN o Apt w/ Dr. Quay Burow (ID) on 1/25. Notes that he has a PIC line, getting vanc outpatient BID and ciprofloxacin PO, which is causing significant stomach upset.  o Apt w/ Dr. Cari Caraway 2/4. Notes pain management appointment scheduled 02/18/20.   Pharmacist Clinical Goal(s):  Marland Kitchen Over the next 90 days, patient will work with PharmD and provider towards optimized medication management  Interventions: . Comprehensive medication review  performed; medication list updated in electronic medical record . Reviewed to contact Coralie Keens at [facility] if he feels this regimen does not adequately control his pain, before he establishes w/ pain management . Will collaborate w/ Care Guide to see if any resources available to help with vehicle repair costs . Reiterated phone number to contact for [facility] transportation, as he does not have a functioning vehicle or license. He is to call them to set up transportation for his ID appointment next week.  Patient Self Care Activities:  . Patient will take medications as prescribed  Please see past updates related to this goal by clicking on the "Past Updates" button in the selected goal           Plan:  - Will collaborate w/ CCM team as above - Scheduled f/u call 02/25/20  Catie Darnelle Maffucci, PharmD, [place] [phone]

## 2020-01-18 ENCOUNTER — Other Ambulatory Visit (HOSPITAL_COMMUNITY)
Admission: RE | Admit: 2020-01-18 | Discharge: 2020-01-18 | Disposition: A | Discharge status: Home / Self Care | Payer: Medicaid Other | Source: Other Acute Inpatient Hospital | Attending: Orthopedic Surgery | Admitting: Orthopedic Surgery

## 2020-01-18 DIAGNOSIS — M4636 Infection of intervertebral disc (pyogenic), lumbar region: Secondary | ICD-10-CM | POA: Insufficient documentation

## 2020-01-18 LAB — BASIC METABOLIC PANEL
Anion gap: 8 (ref 5–15)
BUN: 9 mg/dL (ref 6–20)
CO2: 29 mmol/L (ref 22–32)
Calcium: 8.7 mg/dL — ABNORMAL LOW (ref 8.9–10.3)
Chloride: 104 mmol/L (ref 98–111)
Creatinine, Ser: 1.12 mg/dL (ref 0.61–1.24)
GFR calc Af Amer: >60 mL/min (ref >60)
GFR calc non Af Amer: >60 mL/min (ref >60)
Glucose, Bld: 92 mg/dL (ref 70–99)
Potassium: 3.8 mmol/L (ref 3.5–5.1)
Sodium: 141 mmol/L (ref 135–145)

## 2020-01-18 LAB — CBC WITH DIFFERENTIAL/PLATELET
Abs Immature Granulocytes: 0.02 10*3/uL (ref 0.00–0.07)
Basophils Absolute: 0.1 10*3/uL (ref 0.0–0.1)
Basophils Relative: 1 %
Eosinophils Absolute: 0.3 10*3/uL (ref 0.0–0.5)
Eosinophils Relative: 5 %
HCT: 36.3 % — ABNORMAL LOW (ref 39.0–52.0)
Hemoglobin: 12.2 g/dL — ABNORMAL LOW (ref 13.0–17.0)
Immature Granulocytes: 0 %
Lymphocytes Relative: 32 %
Lymphs Abs: 2.1 10*3/uL (ref 0.7–4.0)
MCH: 31.6 pg (ref 26.0–34.0)
MCHC: 33.6 g/dL (ref 30.0–36.0)
MCV: 94 fL (ref 80.0–100.0)
Monocytes Absolute: 0.8 10*3/uL (ref 0.1–1.0)
Monocytes Relative: 12 %
Neutro Abs: 3.2 10*3/uL (ref 1.7–7.7)
Neutrophils Relative %: 50 %
Platelets: 218 10*3/uL (ref 150–400)
RBC: 3.86 MIL/uL — ABNORMAL LOW (ref 4.22–5.81)
RDW: 13.5 % (ref 11.5–15.5)
WBC: 6.4 10*3/uL (ref 4.0–10.5)
nRBC: 0 % (ref 0.0–0.2)

## 2020-01-18 LAB — SEDIMENTATION RATE: Sed Rate: 5 mm/hr (ref 0–16)

## 2020-01-19 LAB — VANCOMYCIN, TROUGH: Vancomycin Tr: 19 ug/mL (ref 15–20)

## 2020-01-19 NOTE — Telephone Encounter (Signed)
Pt called asking to get Jill Alexanders to give him a call back regarding a place for him to live.  He said Curt Bears is assisting him/ CB#  [phone]

## 2020-01-20 ENCOUNTER — Telehealth: Payer: Self-pay | Admitting: Family Medicine

## 2020-01-20 ENCOUNTER — Ambulatory Visit: Payer: Self-pay | Admitting: General Practice

## 2020-01-20 DIAGNOSIS — Z5900 Homelessness unspecified: Secondary | ICD-10-CM

## 2020-01-20 NOTE — Telephone Encounter (Signed)
error 

## 2020-01-20 NOTE — Telephone Encounter (Signed)
Spoke with Johnny [name] and gave him the information regarding [employer] and [employer]. He stated he would call both to follow up. He said that his only concern with [facility] was the restrictions placed on the residents in regards to cellphone use and coming and going at the residence. He did say he would like their # so he could call and ask more questions. Stated he had been trying to call [employer] and I let him know that they are only open until 4pm he would call them this afternoon. knb   [email or web address]  [email or web address]

## 2020-01-20 NOTE — Chronic Care Management (AMB) (Signed)
Care Management   Follow Up Note   01/20/2020 Name: Johnny [name]. MRN: [id number] DOB: February 16, 1971  Referred by: Valerie Roys, DO Reason for referral : Care Coordination (homeless after 01-26-2020 asking for assistance)   Cole Furrh. is a 49 y.o. year old male who is a primary care patient of Valerie Roys, DO. The care management team was consulted for assistance with care management and care coordination needs.    Review of patient status, including review of consultants reports, relevant laboratory and other test results, and collaboration with appropriate care team members and the patient's provider was performed as part of comprehensive patient evaluation and provision of chronic care management services.    SDOH (Social Determinants of Health) screening performed today: Biomedical engineer . See Care Plan for related entries.   Advanced Directives: See Care Plan and Vynca application for related entries.   Goals Addressed            This Visit's Progress    RNCM: "I need to find a new place to live as soon as possible"        Care Management   Follow Up Note   01/20/2020 Name: Johnny [name]. MRN: [id number] DOB: Oct 24, 1971  Referred by: Valerie Roys, DO Reason for referral : Care Coordination (homeless after 01-26-2020 asking for assistance)   Johnny [name]. is a 49 y.o. year old male who is a primary care patient of Valerie Roys, DO. The care management team was consulted for assistance with care management and care coordination needs.    Review of patient status, including review of consultants reports, relevant laboratory and other test results, and collaboration with appropriate care team members and the patient's provider was performed as part of comprehensive patient evaluation and provision of chronic care management services.    SDOH (Social Determinants of Health) screening performed today:  Biomedical engineer . See Care Plan for related entries.   Advanced Directives: See Care Plan and Vynca application for related entries.   The patient had called and left a message on the [facility] voice mail asking for a call back.  The patient called back. The patient verbalized he needed help with finding a new place to live because after 01-26-2020 he would be homeless.  The patient expressed that the house beside him (100 feet away) has "prostitution" and "drugs".  The patient states he cannot continue to live there.  The patient educated on the care guide calling last week and offering him paperwork to fill out but he declined. The patient states he will need housing before the paperwork can be returned and he has no way of returning the paperwork due to lack of transportation.  The patient is currently living in [facility] and wants to come back to [facility].  Education and support given. A new care guide referral made for assistance with housing and transportation.  The patient also expressed concern over the PICC line that he has in place. Currently home health is coming out one time a week to assess.  The patient verbalized after 01-26-2020 they will not know where to find him.  Recommended the patient not move from his existing house until new housing can be found.  The patient verbalized he would talk to the care guide for resources to help with finding acceptable housing.     The care management team will reach out to the patient  again over the next 30 days.   Noreene Larsson RN, MSN, [facility] Mobile: [phone]         The care management team will reach out to the patient again over the next 30 days.   Noreene Larsson RN, MSN, [facility] Mobile: [phone]

## 2020-01-20 NOTE — Patient Instructions (Signed)
Visit Information  Goals Addressed            This Visit's Progress   . RNCM: "I need to find a new place to live as soon as possible"        Care Management   Follow Up Note   01/20/2020 Name: Johnny [name]. MRN: [id number] DOB: Jun 08, 1971  Referred by: Valerie Roys, DO Reason for referral : Care Coordination (homeless after 01-26-2020 asking for assistance)   Johnny [name]. is a 49 y.o. year old male who is a primary care patient of Valerie Roys, DO. The care management team was consulted for assistance with care management and care coordination needs.    Review of patient status, including review of consultants reports, relevant laboratory and other test results, and collaboration with appropriate care team members and the patient's provider was performed as part of comprehensive patient evaluation and provision of chronic care management services.    SDOH (Social Determinants of Health) screening performed today: Biomedical engineer . See Care Plan for related entries.   Advanced Directives: See Care Plan and Vynca application for related entries.   The patient had called and left a message on the [facility] voice mail asking for a call back.  The patient called back. The patient verbalized he needed help with finding a new place to live because after 01-26-2020 he would be homeless.  The patient expressed that the house beside him (100 feet away) has "prostitution" and "drugs".  The patient states he cannot continue to live there.  The patient educated on the care guide calling last week and offering him paperwork to fill out but he declined. The patient states he will need housing before the paperwork can be returned and he has no way of returning the paperwork due to lack of transportation.  The patient is currently living in [facility] and wants to come back to [facility].  Education and support given. A new care guide referral made for  assistance with housing and transportation.  The patient also expressed concern over the PICC line that he has in place. Currently home health is coming out one time a week to assess.  The patient verbalized after 01-26-2020 they will not know where to find him.  Recommended the patient not move from his existing house until new housing can be found.  The patient verbalized he would talk to the care guide for resources to help with finding acceptable housing.     The care management team will reach out to the patient again over the next 30 days.   Noreene Larsson RN, MSN, [facility] Mobile: [phone]        The patient verbalized understanding of instructions provided today and declined a print copy of patient instruction materials.   The care management team will reach out to the patient again over the next 30 days.   Noreene Larsson RN, MSN, [facility] Mobile: [phone]

## 2020-01-20 NOTE — Telephone Encounter (Signed)
    Called pt regarding Johnny [name] Referral for . Patient is a 49 y.o. male  and is under the care of Valerie Roys, DO  01/20/20 lvm for [employer] in [place] to find out if patient would be eligible for their services or if they can refer out to another org to help with [name] [id number] - Nada Boozer or [place] . Embedded Care Coordination [place]  Care Management ??[email or web address]  ??[phone]

## 2020-01-22 ENCOUNTER — Telehealth: Payer: Self-pay | Admitting: Family Medicine

## 2020-01-22 NOTE — Telephone Encounter (Signed)
Spoke with Johnny [name] regarding the resources I gave him for housing and he stated the called [employer] and there is no availability and the income based housing list that he had he called 40 places all with a 1 year waiting list. The [employer] indicated that he would have to work at the facility (even though he had back surgery), would not be able to have a phone, and could not leave the facility. Pt indicated that he had a contact with [facility] in [place] and he would try to connect with them about emergency housing.  His current situation is not suiting him only two usable workable outlets and he has to heat his apt with the oven and the well water is not clean.  Looked to [facility] 360 referral and no options for emergency housing in [facility], will reach out to the [employer] for inclusive healthcare to see if others are aware of options for Johnny [name].  knb

## 2020-01-23 NOTE — Telephone Encounter (Signed)
° ° °  Called pt regarding [employer] Referral for housing, lmtcb  [place] Management ??[email or web address]   ??[id number]

## 2020-01-23 NOTE — Telephone Encounter (Signed)
Scientist, physiological is full, [employer] is not taking applications at this time and [employer] has a year and 1/2 wait list  I have sent a follow up email to the coordinator of the [employer] for [employer] to find other solutions and hoping we haven't exhausted our options.  Routing encounter to SW and RN for assistance    From: Jill Alexanders [facility])  Sent: Friday, January 23, 2020 9:55 AM To: Robie Ridge [email or web address]> Subject: Emergency Housing Referral Importance: High  Hi Lily,  The [employer] is not currently taking any applications and I am working to get a patient of ours into housing in [place].I believe the [employer] has a year and  waitlist.  Do you happen to have any updates from any of the members of the Network in regards to leads on housing. He stated that he has called over 40 places on the Income based housing list that our Education officer, museum provided.  He is currently renting in [facility] and is having to move out by Feb 1) his physician is in [place] and he also has appts coming up in [place] and would prefer to live in [place].  [employer] [employer] is full as well.  Thanks for any help!  [place] . Embedded Care Coordination [place]  Care Management ??[email or web address]  ??[phone]

## 2020-01-26 ENCOUNTER — Ambulatory Visit: Payer: Self-pay | Admitting: General Practice

## 2020-01-26 NOTE — Telephone Encounter (Signed)
° ° °  Called pt regarding [employer] Referral for emergency housing . Patient is a 49 y.o. male  and is under the care of Johnson, Megan P, DO  Pt stated that his landlord has not talked about evicting him yet but he said he cannot stay where he is living Gave him resources from [facility] that I received from [employer], pt reiterated that he could not stay in [facility] as his appts are in [place] and in [place].  Pt stated that he did not follow up with [employer] as discussed last week as he said the Geophysical data processor had health concerns. Pt asked about a motel voucher b/c he would not be able to pay for a room by himself, he has other bills including a lawyer fee due for a traffic violation for driving without car insurance. Told pt I would be sending this back to CCM to see if there are any other options as I I have exhausted all resources that I have. Phone call with Noreene Larsson to discuss next steps, I told pt that he has an appt phone call with Jerene Pitch on 2/22 to follow up and he asked for a c/b today. She stated that Alisa may need to intervene as this pt has been in a cycle of housing problems for awhile.  Closing referral pending any other needs of patient.  [place] Management ??[email or web address]   ??[id number]

## 2020-01-26 NOTE — Chronic Care Management (AMB) (Signed)
  Chronic Care Management   Outreach Note  01/26/2020 Name: Johnny [name]. MRN: [id number] DOB: October 09, 1971  Referred by: Valerie Roys, DO Reason for referral : Care Coordination (Housing referral and follow up on resources/VM left by the patient)   An unsuccessful telephone outreach was attempted today. The patient was referred to the case management team by for assistance with care management and care coordination. The patient has made multiple calls to the CCM team to assist with housing. Resources have been provided.   Follow Up Plan: The patient has been provided with contact information for the care management team and has been advised to call with any health related questions or concerns.   Noreene Larsson RN, MSN, [facility] Mobile: [phone]

## 2020-01-27 ENCOUNTER — Telehealth: Payer: Self-pay | Admitting: Family Medicine

## 2020-01-27 NOTE — Telephone Encounter (Signed)
Pt called me directly to find out if there was anything else we could do to help him he stated he would be living on the streets tonight, he did say he talked to a church about help and is waiting to hear back.  Pt knows that I cannot assist as I have exhausted all of my resources.  Routing to CCM team .

## 2020-01-28 ENCOUNTER — Ambulatory Visit: Payer: Self-pay | Admitting: Licensed Clinical Social Worker

## 2020-01-28 DIAGNOSIS — Z5900 Homelessness unspecified: Secondary | ICD-10-CM

## 2020-01-28 NOTE — Chronic Care Management (AMB) (Signed)
  Care Management   Follow Up Note   01/28/2020 Name: Johnny [name]. MRN: [id number] DOB: Feb 05, 1971  Referred by: Valerie Roys, DO Reason for referral : Care Coordination   Johnny [name] Johnny [name]. is a 49 y.o. year old male who is a primary care patient of Valerie Roys, DO. The care management team was consulted for assistance with care management and care coordination needs.    Review of patient status, including review of consultants reports, relevant laboratory and other test results, and collaboration with appropriate care team members and the patient's provider was performed as part of comprehensive patient evaluation and provision of chronic care management services.    Goals Addressed    . "I need safe and stable housing" (pt-stated)       Current Barriers:  . Financial constraints . Limited social support . Limited access to food . Housing barriers . Limited education about housing resources available during COVID-19*  Clinical Social Work Clinical Goal(s):  Marland Kitchen Over the next 90 days, client will work with SW to address concerns related to lack of stable housing. . Over the next 90 days, client will follow up with crisis support resources * as directed by SW  Interventions: . Patient interviewed and appropriate assessments performed . LCSW sent text message to patient with a list of financial support resources and low incoming housing resources and homeless shelters for him to refer back to whenever needed . Provided patient with information about housing resources available during [place]-  . Patient reports that he wishes to relocate out of the boarding house he's in. He reports that this boarding house is actually very nice and updated but reports that there are two other male roommates living there with him and one causing constant conflict. He shares that one roommate often has arguments with his girlfriend which causes a disturbance throughout the nights and  early mornings.  Marland Kitchen LCSW will completed C3 Guide and will update CCM team and PCP. Marland Kitchen Advised patient to contact CCM team directly if any urgent needs present . Assisted patient/caregiver with obtaining information about health plan benefits . Patient reports ongoing chronic pain . Patient was encouraged to go to [employer] today to inquire where he is at on the wait list for [employer]. Marland Kitchen LCSW completed call to patient's Medicaid Representative and seeing if an occupational therapy referral could be made to have Johnny [name] evaluated and possibly consider a Representative payee that pays his bills and gives him an allotment since it appears he can not manage his care/finances effectively. LCSW was unable to reach [facility] caseworker [name] but left a detailed voice message and requested a return call back once available. Patient's Medicaid caseworker's number is [phone].  Patient Self Care Activities:  . Attends all scheduled provider appointments . Calls provider office for new concerns or questions  Please see past updates related to this goal by clicking on the "Past Updates" button in the selected goal      A HIPPA compliant phone message was left for the patient's Medicaid caseworker providing contact information and requesting a return call.   [name], BSW, [name], [facility] Care Management [email or web address] Phone: [phone]

## 2020-01-30 ENCOUNTER — Ambulatory Visit: Payer: Self-pay | Admitting: Licensed Clinical Social Worker

## 2020-01-30 DIAGNOSIS — Z5900 Homelessness unspecified: Secondary | ICD-10-CM

## 2020-01-30 NOTE — Chronic Care Management (AMB) (Signed)
Chronic Care Management    Clinical Social Work Follow Up Note  01/30/2020 Name: Johnny [name]. MRN: [id number] DOB: 1971-04-01  Johnny [name] Johnny [name]. is a 49 y.o. year old male who is a primary care patient of Johnny Roys, DO. The CCM team was consulted for assistance with Financial Difficulties related to housing.   Review of patient status, including review of consultants reports, other relevant assessments, and collaboration with appropriate care team members and the patient's provider was performed as part of comprehensive patient evaluation and provision of chronic care management services.    SDOH (Social Determinants of Health) screening performed today: Housing . See Care Plan for related entries.   Advanced Directives Status: <no information> See Care Plan for related entries.   Outpatient Encounter Medications as of 01/30/2020  Medication Sig  . ciprofloxacin (CIPRO) 750 MG tablet Take 750 mg by mouth 2 (two) times daily.  . diphenhydramine-acetaminophen (TYLENOL PM) 25-500 MG TABS tablet Take 1 tablet by mouth at bedtime as needed (sleep).   . gabapentin (NEURONTIN) 600 MG tablet Take 2 tablets (1,200 mg total) by mouth 3 (three) times daily. (Patient taking differently: Take 600 mg by mouth 3 (three) times daily. Take with 800mg  tablet for 1400mg  total)  . gabapentin (NEURONTIN) 800 MG tablet Take 800 mg by mouth 3 (three) times daily. Take with 600 mg tablet to equal 1400 mg 3 times daily  . oxyCODONE 10 MG TABS Take 1 tablet (10 mg total) by mouth every 6 (six) hours as needed for severe pain. (Patient taking differently: Take 10 mg by mouth every 6 (six) hours as needed for severe pain. Per [name] at [facility])  . tiZANidine (ZANAFLEX) 4 MG tablet Take 1 tablet (4 mg total) by mouth every 6 (six) hours as needed for muscle spasms.  . Triamcinolone Acetonide (GOODSENSE NASAL ALLERGY SPRAY NA) Place 1 spray into the nose daily as needed (allergies).  Johnny [name] HCL IV Inject  1.75 g into the vein every 12 (twelve) hours.   No facility-administered encounter medications on file as of 01/30/2020.     Goals Addressed    . "I need safe and stable housing" (pt-stated)       Current Barriers:  . Financial constraints . Limited social support . Limited access to food . Housing barriers . Limited education about housing resources available during COVID-19*  Clinical Social Work Clinical Goal(s):  Marland Kitchen Over the next 90 days, client will work with SW to address concerns related to lack of stable housing. . Over the next 90 days, client will follow up with crisis support resources * as directed by SW  Interventions: . Patient interviewed and appropriate assessments performed . LCSW sent text message to patient with a list of financial support resources and low incoming housing resources and homeless shelters for him to refer back to whenever needed . Provided patient with information about housing resources available during [place]-  . Patient reports that he is currently homeless  . LCSW has collaborated with entire CCM team, C3 Guide and PCP. Marland Kitchen Advised patient to contact CCM team directly if any urgent needs present . Assisted patient/caregiver with obtaining information about health plan benefits . Patient reports ongoing chronic pain . Patient reports being on the wait list for [employer]  . LCSW completed call to patient's Medicaid Representative to see if Reed could get evaluated and possibly consider a Representative payee that pays his bills and gives him an allotment since it  appears that he can not manage his care/finances effectively. LCSW was unable to reach Johnny [name] but left a detailed voice message and requested a return call back once available. Patient's Medicaid caseworker's number is [phone]. Patient was encouraged to contact his Medicaid caseworker. Patient agreeable to contact him for emergency resource support as  patient is unable to find housing. . Patient was encouraged to contact [facility] daily to see about bed availability. Marland Kitchen LCSW provided emotional supprot throughout enitre phone call. Marland Kitchen LCSW provided crisis supprt resource education to patient again.  Patient Self Care Activities:  . Attends all scheduled provider appointments . Calls provider office for new concerns or questions  Please see past updates related to this goal by clicking on the "Past Updates" button in the selected goal      Follow Up Plan: SW will follow up with patient by phone over the next quarter  Johnny [name], [place], [name], [name] Phone: [phone]

## 2020-02-10 ENCOUNTER — Telehealth: Payer: Self-pay | Admitting: Family Medicine

## 2020-02-10 NOTE — Telephone Encounter (Signed)
Call patient. He states that he needs his 600 MG gabapentin from [facility]. According to chart, that's where it was sent to. But patient states that they told him that they do not have it.   [facility] not [facility] as it was transferred to the [place]. They are having it transferred back to their store so patient can pick it up.   Called patient back and let him know what is going on. Should be able to get RX for the 600 mg from [place] shortly.

## 2020-02-10 NOTE — Telephone Encounter (Signed)
Medication Refill - Medication:gabapentin (NEURONTIN) 800 MG tablet [id number]  Pt stated that he recently moved and would like to know if Dr Wynetta Emery could call in another script for him?  He is aware he may need an appt.    Has the patient contacted their pharmacy? No. (Agent: If no, request that the patient contact the pharmacy for the refill.) (Agent: If yes, when and what did the pharmacy advise?)  Preferred Pharm[facility] (with phone [facility] [place] Please be advised that RX refills may take up to 3 business days. We ask that you follow-up with your pharmacy.

## 2020-02-10 NOTE — Telephone Encounter (Signed)
I do not write the 800. I cannot give him those. The 600mg  had a 6 month supply called in in December. It's at the pharmacy, he just needs to have it transferred.

## 2020-02-10 NOTE — Telephone Encounter (Signed)
*  correction*   Pt called and stated that he needs the 600Mg  and not the 800Mg .   Please advise

## 2020-02-10 NOTE — Telephone Encounter (Signed)
LVM that refills already at pharmacy. Advised pt to call back if needed.

## 2020-02-13 ENCOUNTER — Other Ambulatory Visit: Payer: Self-pay

## 2020-02-13 ENCOUNTER — Encounter: Payer: Self-pay | Admitting: Family Medicine

## 2020-02-13 ENCOUNTER — Telehealth (INDEPENDENT_AMBULATORY_CARE_PROVIDER_SITE_OTHER): Payer: Medicaid Other | Admitting: Family Medicine

## 2020-02-13 VITALS — Ht 72.0 in | Wt 192.0 lb

## 2020-02-13 DIAGNOSIS — R040 Epistaxis: Secondary | ICD-10-CM | POA: Diagnosis not present

## 2020-02-13 DIAGNOSIS — J029 Acute pharyngitis, unspecified: Secondary | ICD-10-CM | POA: Diagnosis not present

## 2020-02-13 NOTE — Progress Notes (Signed)
Ht 6' (1.829 m)   Wt 192 lb (87.1 kg)   BMI 26.04 kg/m    Subjective:    Patient ID: Johnny Jewett., male    DOB: 09-03-1971, 49 y.o.   MRN: [id number]  HPI: Johnny Godkin. is a 49 y.o. male  Chief Complaint  Patient presents with  . Nasal Congestion    pt states he has had nose bleed x the last 2 days   . This visit was completed via MyChart due to the restrictions of the COVID-19 pandemic. All issues as above were discussed and addressed. Physical exam was done as above through visual confirmation on MyChart. If it was felt that the patient should be evaluated in the office, they were directed there. The patient verbally consented to this visit. . Location of the patient: in parked car . Location of the provider: work . Those involved with this call:  . Provider: Merrie Roof, PA-C . CMA: Lesle Chris, [place] . Front Desk/Registration: Jill Side  . Time spent on call: 15 minutes with patient face to face via video conference. More than 50% of this time was spent in counseling and coordination of care. 5 minutes total spent in review of patient's record and preparation of their chart.  I verified patient identity using two factors (patient name and date of birth). Patient consents verbally to being seen via telemedicine visit today.   Having right sided nasal pain and nose bleeds, sore throat with pain with swallowing. Denies cough, SOB, fevers, chills, sweats, CP, SOB. Taking tylenol with minimal relief. Hx of deviated septum  Relevant past medical, surgical, family and social history reviewed and updated as indicated. Interim medical history since our last visit reviewed. Allergies and medications reviewed and updated.  Review of Systems  Per HPI unless specifically indicated above     Objective:    Ht 6' (1.829 m)   Wt 192 lb (87.1 kg)   BMI 26.04 kg/m   Wt Readings from Last 3 Encounters:  02/13/20 192 lb (87.1 kg)  01/07/20 189 lb 13.1 oz (86.1  kg)  12/18/19 190 lb (86.2 kg)    Physical Exam Vitals and nursing note reviewed.  Constitutional:      General: He is not in acute distress.    Appearance: Normal appearance.  HENT:     Head: Atraumatic.     Right Ear: External ear normal.     Left Ear: External ear normal.     Nose: No congestion.     Comments: Nasal mucosa erythematous. No obvious discharge    Mouth/Throat:     Mouth: Mucous membranes are moist.     Pharynx: Oropharynx is clear.  Eyes:     Extraocular Movements: Extraocular movements intact.     Conjunctiva/sclera: Conjunctivae normal.  Pulmonary:     Effort: Pulmonary effort is normal. No respiratory distress.  Musculoskeletal:        General: Normal range of motion.     Cervical back: Normal range of motion.  Skin:    General: Skin is dry.     Findings: No erythema or rash.  Neurological:     Mental Status: He is oriented to person, place, and time.  Psychiatric:        Mood and Affect: Mood normal.        Thought Content: Thought content normal.        Judgment: Judgment normal.     Results for orders placed or performed during  the hospital encounter of [id number]  Basic metabolic panel  Result Value Ref Range   Sodium 141 135 - 145 mmol/L   Potassium 3.8 3.5 - 5.1 mmol/L   Chloride 104 98 - 111 mmol/L   CO2 29 22 - 32 mmol/L   Glucose, Bld 92 70 - 99 mg/dL   BUN 9 6 - 20 mg/dL   Creatinine, Ser 1.12 0.61 - 1.24 mg/dL   Calcium 8.7 (L) 8.9 - 10.3 mg/dL   GFR calc non Af Amer >60 >60 mL/min   GFR calc Af Amer >60 >60 mL/min   Anion gap 8 5 - 15  CBC with Differential/Platelet  Result Value Ref Range   WBC 6.4 4.0 - 10.5 K/uL   RBC 3.86 (L) 4.22 - 5.81 MIL/uL   Hemoglobin 12.2 (L) 13.0 - 17.0 g/dL   HCT 36.3 (L) 39.0 - 52.0 %   MCV 94.0 80.0 - 100.0 fL   MCH 31.6 26.0 - 34.0 pg   MCHC 33.6 30.0 - 36.0 g/dL   RDW 13.5 11.5 - 15.5 %   Platelets 218 150 - 400 K/uL   nRBC 0.0 0.0 - 0.2 %   Neutrophils Relative % 50 %   Neutro Abs 3.2 1.7  - 7.7 K/uL   Lymphocytes Relative 32 %   Lymphs Abs 2.1 0.7 - 4.0 K/uL   Monocytes Relative 12 %   Monocytes Absolute 0.8 0.1 - 1.0 K/uL   Eosinophils Relative 5 %   Eosinophils Absolute 0.3 0.0 - 0.5 K/uL   Basophils Relative 1 %   Basophils Absolute 0.1 0.0 - 0.1 K/uL   Immature Granulocytes 0 %   Abs Immature Granulocytes 0.02 0.00 - 0.07 K/uL  Sedimentation rate  Result Value Ref Range   Sed Rate 5 0 - 16 mm/hr  Vancomycin, trough  Result Value Ref Range   Vancomycin Tr 19 15 - 20 ug/mL      Assessment & Plan:   Problem List Items Addressed This Visit    None    Visit Diagnoses    Nosebleed    -  Primary   Apply vaseline or neosporin, run a humidifier, start allergy regimen. F/u if not improving   Sore throat       Suspect post-nasal drainage vs viral, start allergy regimen, humidifier. F/u if not improving       Follow up plan: Return if symptoms worsen or fail to improve.

## 2020-02-16 ENCOUNTER — Other Ambulatory Visit: Payer: Self-pay | Admitting: Family Medicine

## 2020-02-16 ENCOUNTER — Telehealth: Payer: Self-pay | Admitting: Family Medicine

## 2020-02-16 ENCOUNTER — Ambulatory Visit: Payer: Medicaid Other | Admitting: Licensed Clinical Social Worker

## 2020-02-16 DIAGNOSIS — Z5900 Homelessness unspecified: Secondary | ICD-10-CM

## 2020-02-16 DIAGNOSIS — R040 Epistaxis: Secondary | ICD-10-CM

## 2020-02-16 DIAGNOSIS — M792 Neuralgia and neuritis, unspecified: Secondary | ICD-10-CM

## 2020-02-16 DIAGNOSIS — H539 Unspecified visual disturbance: Secondary | ICD-10-CM

## 2020-02-16 DIAGNOSIS — G894 Chronic pain syndrome: Secondary | ICD-10-CM

## 2020-02-16 MED ORDER — TIZANIDINE HCL 4 MG PO TABS: 4.0000 mg | ORAL_TABLET | Freq: Four times a day (QID) | ORAL | 0 refills | Status: DC | PRN

## 2020-02-16 MED ORDER — AMOXICILLIN-POT CLAVULANATE 875-125 MG PO TABS: 1.0000 | ORAL_TABLET | Freq: Two times a day (BID) | ORAL | 0 refills | Status: DC

## 2020-02-16 NOTE — Telephone Encounter (Signed)
Patient notified

## 2020-02-16 NOTE — Telephone Encounter (Signed)
Requested medication (s) are due for refill today: Yes  Requested medication (s) are on the active medication list: Yes  Last refill:  11/18/19  Future visit scheduled: No  Notes to clinic:  See request    Requested Prescriptions  Pending Prescriptions Disp Refills   tiZANidine (ZANAFLEX) 4 MG tablet 60 tablet 0    Sig: Take 1 tablet (4 mg total) by mouth every 6 (six) hours as needed for muscle spasms.      Not Delegated - Cardiovascular:  Alpha-2 Agonists - tizanidine Failed - 02/16/2020  8:31 AM      Failed - This refill cannot be delegated      Passed - Valid encounter within last 6 months    Recent Outpatient Visits           3 days ago [place], Lilia Argue, [place]   2 months ago Appointment canceled by hospital   [facility], Megan P, DO   3 months ago Chronic pain syndrome   [place], Jud, DO   3 months ago [place]   Hardy P, DO   4 months ago Osteoarthritis of spine with radiculopathy, lumbar region   [facility], [place], DO

## 2020-02-16 NOTE — Telephone Encounter (Signed)
Medication Refill - Medication: tiZANidine (ZANAFLEX) 4 MG tablet    Has the patient contacted their pharmacy? Yes.   (Agent: If no, request that the patient contact the pharmacy for the refill.) (Agent: If yes, when and what did the pharmacy advise?)  Preferred Pharm[facility] (with phone [id number] [place], [address] Phone:  [phone]  Fax:  [phone]       Agent: Please be advised that RX refills may take up to 3 business days. We ask that you follow-up with your pharmacy.

## 2020-02-16 NOTE — Telephone Encounter (Signed)
Referral generated  Copied from [place] [phone]. Topic: Referral - Request for Referral >> Feb 16, 2020  2:49 PM Reyne Dumas L wrote: Has patient seen PCP for this complaint? yes *If NO, is insurance requiring patient see PCP for this issue before PCP can refer them? Referral for which specialty: eye doctor Preferred provider/office:none - just that they take Medicaid Reason for referral: went to [facility] today and had issues seeing the signs.

## 2020-02-16 NOTE — Telephone Encounter (Signed)
Routing to provider  

## 2020-02-16 NOTE — Chronic Care Management (AMB) (Signed)
Care Management   Follow Up Note   02/16/2020 Name: Johnny [name]. MRN: [id number] DOB: 1971-09-11  Referred by: Valerie Roys, DO Reason for referral : Care Coordination   Johnny [name]. is a 49 y.o. year old male who is a primary care patient of Valerie Roys, DO. The care management team was consulted for assistance with care management and care coordination needs.    Review of patient status, including review of consultants reports, relevant laboratory and other test results, and collaboration with appropriate care team members and the patient's provider was performed as part of comprehensive patient evaluation and provision of chronic care management services.    SDOH (Social Determinants of Health) assessments performed: Yes    Advanced Directives: See Care Plan and [place] application for related entries.   Goals Addressed    . "I need safe and stable housing" (pt-stated)       Current Barriers:  . Financial constraints . Limited social support . Limited access to food . Housing barriers . Limited education about housing resources available during COVID-19*  Clinical Social Work Clinical Goal(s):  Marland Kitchen Over the next 90 days, client will work with SW to address concerns related to lack of stable housing. . Over the next 90 days, client will follow up with crisis support resources * as directed by SW  Interventions: . Patient interviewed and appropriate assessments performed . LCSW sent text message to patient with a list of financial support resources and low incoming housing resources and homeless shelters for him to refer back to whenever needed . Provided patient with information about housing resources available during [place]-  . Patient reports that he is currently homeless and staying at the [employer] but he is only able to reside there for another month. Patient reports that the shelter has assisted him with paying for him to get his  license renewed. He shares that they have also assisted him with starting the disability enrollment process as patient reports that his back continues to cause chronic pain and his knees give out when he attempts to work.  Marland Kitchen LCSW has collaborated with entire CCM team, C3 Guide and PCP. Marland Kitchen Advised patient to contact CCM team directly if any urgent needs present . Assisted patient/caregiver with obtaining information about health plan benefits . Patient reports ongoing chronic pain . Patient reports being on the wait list for [employer]  . LCSW completed call to patient's Medicaid Representative to see if [name] could get evaluated and possibly consider a Representative payee that pays his bills and gives him an allotment since it appears that he can not manage his care/finances effectively. LCSW was unable to reach [facility] caseworker [name] but left a detailed voice message and requested a return call back once available. Patient's Medicaid caseworker's number is [phone]. Patient was encouraged to contact his Medicaid caseworker again today. Patient agreeable to contact him for emergency resource support as patient is unable to find stable housing. . Patient was encouraged to use vaseline around his nose as he reports ongoing dryness and irritation.  Marland Kitchen LCSW provided emotional supprot throughout enitre phone call. Marland Kitchen LCSW provided crisis supprt resource education to patient again.  Patient Self Care Activities:  . Attends all scheduled provider appointments . Calls provider office for new concerns or questions  Please see past updates related to this goal by clicking on the "Past Updates" button in the selected goal      The  care management team will reach out to the patient again over the next quarter.  [name], BSW, [name], [facility] Care Management [name] Phone: [phone]

## 2020-02-16 NOTE — Telephone Encounter (Signed)
Pt stated he did a virtual appt with Apolonio Schneiders on Friday and was told to call back Monday if he was not any better and his nose is very very sore. Pt stated he feels worse and would like rx to help. Please advise.  [id number] [place] [place] [facility] [address] Phone:  [phone]  Fax:  [phone]

## 2020-02-16 NOTE — Telephone Encounter (Signed)
Sent over antibiotics, he should continue what was discussed at [place]

## 2020-02-23 DIAGNOSIS — Z590 Homelessness: Secondary | ICD-10-CM

## 2020-02-23 DIAGNOSIS — Z5901 Sheltered homelessness: Secondary | ICD-10-CM

## 2020-02-23 HISTORY — DX: Homelessness: Z59.0

## 2020-02-23 HISTORY — DX: Sheltered homelessness: Z59.01

## 2020-02-24 ENCOUNTER — Other Ambulatory Visit: Payer: Self-pay | Admitting: Family Medicine

## 2020-02-24 NOTE — Telephone Encounter (Signed)
Medication is not due. Given 02/16/20 for #60. Please refuse.

## 2020-02-25 ENCOUNTER — Telehealth: Payer: Medicaid Other

## 2020-02-26 ENCOUNTER — Telehealth: Payer: Self-pay | Admitting: Family Medicine

## 2020-02-26 ENCOUNTER — Encounter: Payer: Self-pay | Admitting: Emergency Medicine

## 2020-02-26 ENCOUNTER — Ambulatory Visit: Payer: Medicaid Other | Admitting: General Surgery

## 2020-02-26 ENCOUNTER — Encounter: Payer: Self-pay | Admitting: General Surgery

## 2020-02-26 ENCOUNTER — Other Ambulatory Visit: Payer: Self-pay

## 2020-02-26 VITALS — BP 132/94 | HR 83 | Temp 97.7°F | Resp 12 | Ht 72.0 in | Wt 206.4 lb

## 2020-02-26 DIAGNOSIS — G894 Chronic pain syndrome: Secondary | ICD-10-CM

## 2020-02-26 DIAGNOSIS — K429 Umbilical hernia without obstruction or gangrene: Secondary | ICD-10-CM | POA: Diagnosis not present

## 2020-02-26 NOTE — Patient Instructions (Addendum)
You are scheduled for your CT Abdomen and Pelvis with Contrast on: 03/05/20 at 11:30am at the [facility]. Arrival 11:15am . Nothing to eat or drink 4 hours prior to appointment.  Please pick up your prep kit at the Outpatient Imaging center. Drink 1st bottle at 9:30am and second bottle at 10:30 am.  Dr [name] will give you a call with your results and discuss with you the next step to what needs to be done.   Please call the office if you have any questions or concerns.

## 2020-02-26 NOTE — Telephone Encounter (Signed)
Copied from [place] [phone]. Topic: General - Other >> Feb 25, 2020  1:49 PM Leward Quan A wrote: Reason for CRM: Patient called to inform [place] that he was put out of the pain clinic recently referred to and need a referral to a new one. States that he was short on his medication due to taking extra for the pain he is having. Please advise Ph# [phone] >> Feb 26, 2020  1:46 PM [place], Jacinto Reap wrote: Pt stated he just saw the hernia surgeon today and he was advised that he will have to have surgery again. Pt stated he really needs pain medication and requests a call back asap.

## 2020-02-26 NOTE — H&P (View-Only) (Signed)
Patient ID: Johnny Jewett., male   DOB: 07/16/1971, 49 y.o.   MRN: [id number]  Chief Complaint  Patient presents with  . Follow-up    Umbilical pain     HPI Johnny Bouder Xandyr Delacueva. is a 48 y.o. male.   I performed an open umbilical hernia repair on him in November 2020.  After that operation, he underwent a number of back surgeries that were rife with complications.  He says that after these surgeries, he gained a fair amount of weight.  He says that he has recently noticed a knot at his umbilicus. He says that it seems to be getting bigger.  He says it is tender.  He has also noticed a bulge just cranial to the incision from his hernia repair.  He states that he is unable to perform any heavy lifting secondary to his back surgeries.  He denies any fevers or chills.  No nausea or vomiting.  The tenderness is localized to the umbilicus.   Past Medical History:  Diagnosis Date  . Arthritis    hands, knees, lower back  . Bilateral sciatica   . Carpal tunnel syndrome   . Chronic back pain   . Chronic neck pain   . Chronic pain syndrome   . GERD (gastroesophageal reflux disease)   . Hand joint pain 07/02/2013  . Herniated disc   . Traumatic amputation of left index finger 2013  . Wears dentures    full upper and lower    Past Surgical History:  Procedure Laterality Date  . AMPUTATION FINGER / THUMB Left 2013  . COLONOSCOPY WITH PROPOFOL N/A 01/03/2018   Procedure: COLONOSCOPY WITH PROPOFOL;  Surgeon: [name], MD;  Location: [place];  Service: Endoscopy;  Laterality: N/A;  . ESOPHAGOGASTRODUODENOSCOPY (EGD) WITH PROPOFOL N/A 01/03/2018   Procedure: ESOPHAGOGASTRODUODENOSCOPY (EGD) WITH PROPOFOL;  Surgeon: [name], MD;  Location: [place];  Service: Endoscopy;  Laterality: N/A;  . HERNIA REPAIR Right    right inguinal hernia repaired twice  . HIP SURGERY    . JOINT REPLACEMENT Right    knee  . KNEE SURGERY Right   . LUMBAR  LAMINECTOMY/DECOMPRESSION MICRODISCECTOMY N/A 11/18/2019   Procedure: LUMBAR FOUR-FIVE LUMBAR LAMINECTOMY/DECOMPRESSION MICRODISCECTOMY;  Surgeon: [name], MD;  Location: [place];  Service: Neurosurgery;  Laterality: N/A;  . LUMBAR WOUND DEBRIDEMENT N/A 12/06/2019   Procedure: LUMBAR WOUND REVISION;  Surgeon: [name], MD;  Location: [place];  Service: Neurosurgery;  Laterality: N/A;  . neck fusion    . POLYPECTOMY N/A 01/03/2018   Procedure: POLYPECTOMY;  Surgeon: [name], MD;  Location: [place];  Service: Endoscopy;  Laterality: N/A;  . REPAIR OF CEREBROSPINAL FLUID LEAK N/A 11/28/2019   Procedure: REPAIR OF CEREBROSPINAL FLUID LEAK/LUMBAR;  Surgeon: [name], MD;  Location: [place];  Service: Neurosurgery;  Laterality: N/A;  REPAIR OF CEREBROSPINAL FLUID LEAK/LUMBAR  . REPLACEMENT TOTAL KNEE Right   . SHOULDER SURGERY Right 2016 X 2  . SPINAL FUSION    . TONSILLECTOMY    . UMBILICAL HERNIA REPAIR N/A 11/07/2019   Procedure: HERNIA REPAIR UMBILICAL ADULT;  Surgeon: [name], MD;  Location: [facility];  Service: General;  Laterality: N/A;  . WOUND EXPLORATION N/A 12/15/2019   Procedure: LUMBAR WOUND EXPLORATION;  Surgeon: [name], MD;  Location: [place];  Service: Neurosurgery;  Laterality: N/A;  LUMBAR WOUND EXPLORATION    Family History  Problem Relation Age of Onset  . Cancer Father  sarcoma  . Cirrhosis Paternal Grandmother   . Cancer Paternal Grandfather        Pancreatic  . Fibromyalgia Sister   . Deafness Sister     Social History Social History   Tobacco Use  . Smoking status: Current Every Day Smoker    Packs/day: 0.50    Years: 25.00    Pack years: 12.50    Types: Cigarettes  . Smokeless tobacco: Never Used  Substance Use Topics  . Alcohol use: No  . Drug use: No    Allergies  Allergen Reactions  . Crab [Shellfish Allergy] Itching and Swelling  . Flexeril [Cyclobenzaprine] Anaphylaxis  . Codeine Itching  .  Latex Swelling    Gloves(when worn), condoms  . Robaxin [Methocarbamol] Itching  . Sulfamethoxazole-Trimethoprim Nausea Only and Other (See Comments)    Stomach pain   . Tramadol Other (See Comments)    Current Outpatient Medications  Medication Sig Dispense Refill  . diphenhydramine-acetaminophen (TYLENOL PM) 25-500 MG TABS tablet Take 1 tablet by mouth at bedtime as needed (sleep).     . gabapentin (NEURONTIN) 600 MG tablet Take 2 tablets (1,200 mg total) by mouth 3 (three) times daily. (Patient taking differently: Take 600 mg by mouth 3 (three) times daily. Take with 800mg  tablet for 1400mg  total) 180 tablet 6  . gabapentin (NEURONTIN) 800 MG tablet Take 800 mg by mouth 3 (three) times daily. Take with 600 mg tablet to equal 1400 mg 3 times daily    . tiZANidine (ZANAFLEX) 4 MG tablet Take 1 tablet (4 mg total) by mouth every 6 (six) hours as needed for muscle spasms. 60 tablet 0  . Triamcinolone Acetonide (GOODSENSE NASAL ALLERGY SPRAY NA) Place 1 spray into the nose daily as needed (allergies).     No current facility-administered medications for this visit.    Review of Systems Review of Systems  Gastrointestinal: Positive for abdominal pain.  Musculoskeletal: Positive for back pain and neck pain.  All other systems reviewed and are negative.   Blood pressure (!) 132/94, pulse 83, temperature 97.7 F (36.5 C), resp. rate 12, height 6' (1.829 m), weight 206 lb 6.4 oz (93.6 kg), SpO2 97 %. Body mass index is 27.99 kg/m.  Physical Exam Physical Exam Constitutional:      General: He is not in acute distress.    Appearance: Normal appearance.  HENT:     Head: Normocephalic and atraumatic.     Nose:     Comments: Covered with a mask secondary to COVID-19 precautions    Mouth/Throat:     Comments: Covered with a mask secondary to COVID-19 precautions Eyes:     General: No scleral icterus.       Right eye: No discharge.        Left eye: No discharge.  Cardiovascular:      Rate and Rhythm: Normal rate and regular rhythm.     Pulses: Normal pulses.  Pulmonary:     Effort: Pulmonary effort is normal. No respiratory distress.     Breath sounds: Normal breath sounds.  Abdominal:     Palpations: Abdomen is soft.       Comments: Scar from prior umbilical hernia repair.  I am unable to appreciate a fascial defect in this site, however the exam is limited by patient tenderness.  He has a large diastasis rectus.  Genitourinary:    Comments: Deferred Musculoskeletal:        General: No swelling, tenderness or deformity.     Cervical  back: No rigidity.  Lymphadenopathy:     Cervical: No cervical adenopathy.  Skin:    General: Skin is warm and dry.          Comments: Extensive tattoos. Scar on the lower spine from prior surgical interventions.  Neurological:     General: No focal deficit present.     Mental Status: He is alert and oriented to person, place, and time.  Psychiatric:        Mood and Affect: Mood normal.        Behavior: Behavior normal.     Data Reviewed I reviewed my prior operative report from November 07, 2019.  At that time, I identified 2 small hernia defects and repaired each of them primarily.  Assessment This is a 49 year old man who had an open umbilical hernia repair in November 2020.  He has tenderness in his umbilicus and I am suspicious that he may have a recurrence, however my exam is limited by his discomfort.  Plan We will obtain a CT scan of the abdomen to evaluate for any hernia defects.  He was reassured that the diastasis rectus is of no clinical significance at this time and does not require surgical intervention.  Once I have the CT scan results, I will contact Johnny [name] and discuss the findings and any potential surgical intervention at that time.    [name] 02/26/2020, 3:21 PM

## 2020-02-26 NOTE — Telephone Encounter (Signed)
Unfortunately, if he took more of his medicine than was prescribed even if he was in pain that means he broke his pain contract. That means that they will not give him any more pain medicine, and neither will we. He has been referred multiple places. I'm not sure where else to send him. He is also aware that we do not prescribe pain medicine over the phone.

## 2020-02-26 NOTE — Progress Notes (Signed)
Patient ID: Johnny Jewett., male   DOB: 1971-01-16, 49 y.o.   MRN: [id number]  Chief Complaint  Patient presents with  . Follow-up    Umbilical pain     HPI Johnny Wardrop Benno Shangraw. is a 49 y.o. male.   I performed an open umbilical hernia repair on him in November 2020.  After that operation, he underwent a number of back surgeries that were rife with complications.  He says that after these surgeries, he gained a fair amount of weight.  He says that he has recently noticed a knot at his umbilicus. He says that it seems to be getting bigger.  He says it is tender.  He has also noticed a bulge just cranial to the incision from his hernia repair.  He states that he is unable to perform any heavy lifting secondary to his back surgeries.  He denies any fevers or chills.  No nausea or vomiting.  The tenderness is localized to the umbilicus.   Past Medical History:  Diagnosis Date  . Arthritis    hands, knees, lower back  . Bilateral sciatica   . Carpal tunnel syndrome   . Chronic back pain   . Chronic neck pain   . Chronic pain syndrome   . GERD (gastroesophageal reflux disease)   . Hand joint pain 07/02/2013  . Herniated disc   . Traumatic amputation of left index finger 2013  . Wears dentures    full upper and lower    Past Surgical History:  Procedure Laterality Date  . AMPUTATION FINGER / THUMB Left 2013  . COLONOSCOPY WITH PROPOFOL N/A 01/03/2018   Procedure: COLONOSCOPY WITH PROPOFOL;  Surgeon: [name], MD;  Location: [place];  Service: Endoscopy;  Laterality: N/A;  . ESOPHAGOGASTRODUODENOSCOPY (EGD) WITH PROPOFOL N/A 01/03/2018   Procedure: ESOPHAGOGASTRODUODENOSCOPY (EGD) WITH PROPOFOL;  Surgeon: [name], MD;  Location: [place];  Service: Endoscopy;  Laterality: N/A;  . HERNIA REPAIR Right    right inguinal hernia repaired twice  . HIP SURGERY    . JOINT REPLACEMENT Right    knee  . KNEE SURGERY Right   . LUMBAR  LAMINECTOMY/DECOMPRESSION MICRODISCECTOMY N/A 11/18/2019   Procedure: LUMBAR FOUR-FIVE LUMBAR LAMINECTOMY/DECOMPRESSION MICRODISCECTOMY;  Surgeon: [name], MD;  Location: [place];  Service: Neurosurgery;  Laterality: N/A;  . LUMBAR WOUND DEBRIDEMENT N/A 12/06/2019   Procedure: LUMBAR WOUND REVISION;  Surgeon: [name], MD;  Location: [place];  Service: Neurosurgery;  Laterality: N/A;  . neck fusion    . POLYPECTOMY N/A 01/03/2018   Procedure: POLYPECTOMY;  Surgeon: [name], MD;  Location: [place];  Service: Endoscopy;  Laterality: N/A;  . REPAIR OF CEREBROSPINAL FLUID LEAK N/A 11/28/2019   Procedure: REPAIR OF CEREBROSPINAL FLUID LEAK/LUMBAR;  Surgeon: [name], MD;  Location: [place];  Service: Neurosurgery;  Laterality: N/A;  REPAIR OF CEREBROSPINAL FLUID LEAK/LUMBAR  . REPLACEMENT TOTAL KNEE Right   . SHOULDER SURGERY Right 2016 X 2  . SPINAL FUSION    . TONSILLECTOMY    . UMBILICAL HERNIA REPAIR N/A 11/07/2019   Procedure: HERNIA REPAIR UMBILICAL ADULT;  Surgeon: [name], MD;  Location: [facility];  Service: General;  Laterality: N/A;  . WOUND EXPLORATION N/A 12/15/2019   Procedure: LUMBAR WOUND EXPLORATION;  Surgeon: [name], MD;  Location: [place];  Service: Neurosurgery;  Laterality: N/A;  LUMBAR WOUND EXPLORATION    Family History  Problem Relation Age of Onset  . Cancer Father  sarcoma  . Cirrhosis Paternal Grandmother   . Cancer Paternal Grandfather        Pancreatic  . Fibromyalgia Sister   . Deafness Sister     Social History Social History   Tobacco Use  . Smoking status: Current Every Day Smoker    Packs/day: 0.50    Years: 25.00    Pack years: 12.50    Types: Cigarettes  . Smokeless tobacco: Never Used  Substance Use Topics  . Alcohol use: No  . Drug use: No    Allergies  Allergen Reactions  . Crab [Shellfish Allergy] Itching and Swelling  . Flexeril [Cyclobenzaprine] Anaphylaxis  . Codeine Itching  .  Latex Swelling    Gloves(when worn), condoms  . Robaxin [Methocarbamol] Itching  . Sulfamethoxazole-Trimethoprim Nausea Only and Other (See Comments)    Stomach pain   . Tramadol Other (See Comments)    Current Outpatient Medications  Medication Sig Dispense Refill  . diphenhydramine-acetaminophen (TYLENOL PM) 25-500 MG TABS tablet Take 1 tablet by mouth at bedtime as needed (sleep).     . gabapentin (NEURONTIN) 600 MG tablet Take 2 tablets (1,200 mg total) by mouth 3 (three) times daily. (Patient taking differently: Take 600 mg by mouth 3 (three) times daily. Take with 800mg  tablet for 1400mg  total) 180 tablet 6  . gabapentin (NEURONTIN) 800 MG tablet Take 800 mg by mouth 3 (three) times daily. Take with 600 mg tablet to equal 1400 mg 3 times daily    . tiZANidine (ZANAFLEX) 4 MG tablet Take 1 tablet (4 mg total) by mouth every 6 (six) hours as needed for muscle spasms. 60 tablet 0  . Triamcinolone Acetonide (GOODSENSE NASAL ALLERGY SPRAY NA) Place 1 spray into the nose daily as needed (allergies).     No current facility-administered medications for this visit.    Review of Systems Review of Systems  Gastrointestinal: Positive for abdominal pain.  Musculoskeletal: Positive for back pain and neck pain.  All other systems reviewed and are negative.   Blood pressure (!) 132/94, pulse 83, temperature 97.7 F (36.5 C), resp. rate 12, height 6' (1.829 m), weight 206 lb 6.4 oz (93.6 kg), SpO2 97 %. Body mass index is 27.99 kg/m.  Physical Exam Physical Exam Constitutional:      General: He is not in acute distress.    Appearance: Normal appearance.  HENT:     Head: Normocephalic and atraumatic.     Nose:     Comments: Covered with a mask secondary to COVID-19 precautions    Mouth/Throat:     Comments: Covered with a mask secondary to COVID-19 precautions Eyes:     General: No scleral icterus.       Right eye: No discharge.        Left eye: No discharge.  Cardiovascular:      Rate and Rhythm: Normal rate and regular rhythm.     Pulses: Normal pulses.  Pulmonary:     Effort: Pulmonary effort is normal. No respiratory distress.     Breath sounds: Normal breath sounds.  Abdominal:     Palpations: Abdomen is soft.       Comments: Scar from prior umbilical hernia repair.  I am unable to appreciate a fascial defect in this site, however the exam is limited by patient tenderness.  He has a large diastasis rectus.  Genitourinary:    Comments: Deferred Musculoskeletal:        General: No swelling, tenderness or deformity.     Cervical  back: No rigidity.  Lymphadenopathy:     Cervical: No cervical adenopathy.  Skin:    General: Skin is warm and dry.          Comments: Extensive tattoos. Scar on the lower spine from prior surgical interventions.  Neurological:     General: No focal deficit present.     Mental Status: He is alert and oriented to person, place, and time.  Psychiatric:        Mood and Affect: Mood normal.        Behavior: Behavior normal.     Data Reviewed I reviewed my prior operative report from November 07, 2019.  At that time, I identified 2 small hernia defects and repaired each of them primarily.  Assessment This is a 49 year old man who had an open umbilical hernia repair in November 2020.  He has tenderness in his umbilicus and I am suspicious that he may have a recurrence, however my exam is limited by his discomfort.  Plan We will obtain a CT scan of the abdomen to evaluate for any hernia defects.  He was reassured that the diastasis rectus is of no clinical significance at this time and does not require surgical intervention.  Once I have the CT scan results, I will contact Johnny [name] and discuss the findings and any potential surgical intervention at that time.    [name] 02/26/2020, 3:21 PM

## 2020-02-26 NOTE — Telephone Encounter (Signed)
Patient is calling to request a referral for another pain clinic. Patient was dismissed from Pain clinic for being 5 pills short. Patient is requesting medication for pain. [place], [place] Please advise [phone]

## 2020-02-27 NOTE — Telephone Encounter (Signed)
Referral generated today.

## 2020-02-27 NOTE — Telephone Encounter (Signed)
Already sent to provider

## 2020-02-27 NOTE — Telephone Encounter (Signed)
Called and spoke with patient, he states that he needs to be referred to another pain clinic, can he be sent to [facility], he states that he has not been there yet.

## 2020-02-29 ENCOUNTER — Other Ambulatory Visit: Payer: Self-pay | Admitting: Family Medicine

## 2020-02-29 NOTE — Telephone Encounter (Signed)
Requested medication (s) are due for refill today: yes  Requested medication (s) are on the active medication list: yes  Last refill:  11/17/19  Future visit scheduled: no  Notes to clinic:  historical med and provider   Requested Prescriptions  Pending Prescriptions Disp Refills   gabapentin (NEURONTIN) 800 MG tablet [Pharmacy Med Name: GABAPENTIN 800 MG TABLET] 270 tablet     Sig: TAKE 1 TABLET BY [place]      Neurology: Anticonvulsants - gabapentin Passed - 02/29/2020 10:40 AM      Passed - Valid encounter within last 12 months    Recent Outpatient Visits           2 weeks ago [place], Lilia Argue, [place]   2 months ago Appointment canceled by hospital   [facility], Megan P, DO   3 months ago Chronic pain syndrome   [place], [place], DO   4 months ago Cough   [place] P, DO   4 months ago Osteoarthritis of spine with radiculopathy, lumbar region   [facility], [place], DO

## 2020-03-01 ENCOUNTER — Telehealth: Payer: Self-pay | Admitting: Family Medicine

## 2020-03-01 ENCOUNTER — Other Ambulatory Visit: Payer: Self-pay | Admitting: Family Medicine

## 2020-03-01 MED ORDER — TIZANIDINE HCL 4 MG PO TABS: 4.0000 mg | ORAL_TABLET | Freq: Four times a day (QID) | ORAL | 0 refills | Status: DC | PRN

## 2020-03-01 NOTE — Telephone Encounter (Signed)
Requested medication (s) are due for refill today: yes  Requested medication (s) are on the active medication list: yes  Last refill: 02/16/20  Future visit scheduled: no  Notes to clinic:  not delegated    Requested Prescriptions  Pending Prescriptions Disp Refills   tiZANidine (ZANAFLEX) 4 MG tablet [Pharmacy Med Name: TIZANIDINE HCL 4 MG TABLET] 60 tablet 0    Sig: Take 1 tablet (4 mg total) by mouth every 6 (six) hours as needed for muscle spasms.      Not Delegated - Cardiovascular:  Alpha-2 Agonists - tizanidine Failed - 03/01/2020  9:43 AM      Failed - This refill cannot be delegated      Passed - Valid encounter within last 6 months    Recent Outpatient Visits           2 weeks ago [place], Lilia Argue, [place]   2 months ago Appointment canceled by hospital   [facility], Megan P, DO   3 months ago Chronic pain syndrome   [place], [place], DO   4 months ago Cough   [place] P, DO   4 months ago Osteoarthritis of spine with radiculopathy, lumbar region   [facility], [place], DO

## 2020-03-01 NOTE — Telephone Encounter (Signed)
Copied from [place] [id number]. Topic: General - Other >> Mar 01, 2020  9:56 AM Keene Breath wrote: Reason for CRM: Patient is requesting something to help him stop smoking.  CB# [phone]

## 2020-03-01 NOTE — Telephone Encounter (Signed)
Requested medication (s) are due for refill today: yes  Requested medication (s) are on the active medication list: yes  Last refill:  02/16/20  Future visit scheduled: no  Notes to clinic: not delegated    Requested Prescriptions  Pending Prescriptions Disp Refills   tiZANidine (ZANAFLEX) 4 MG tablet [Pharmacy Med Name: TIZANIDINE HCL 4 MG TABLET] 60 tablet 0    Sig: Take 1 tablet (4 mg total) by mouth every 6 (six) hours as needed for muscle spasms.      Not Delegated - Cardiovascular:  Alpha-2 Agonists - tizanidine Failed - 03/01/2020  9:43 AM      Failed - This refill cannot be delegated      Passed - Valid encounter within last 6 months    Recent Outpatient Visits           2 weeks ago [place], Lilia Argue, [place]   2 months ago Appointment canceled by hospital   [facility], Megan P, DO   3 months ago Chronic pain syndrome   [place], [place], DO   4 months ago Cough   [place] P, DO   4 months ago Osteoarthritis of spine with radiculopathy, lumbar region   [employer], [place], DO

## 2020-03-01 NOTE — Telephone Encounter (Signed)
Scheduled appt 03/02/20 per Dr Wynetta Emery

## 2020-03-01 NOTE — Addendum Note (Signed)
Addended by: Georgina Peer on: 03/01/2020 11:40 AM   Modules accepted: Orders

## 2020-03-01 NOTE — Telephone Encounter (Signed)
Pt calling check status and would like to know why medication was denied. Please advise

## 2020-03-01 NOTE — Telephone Encounter (Signed)
LOV: 11/11/2019

## 2020-03-01 NOTE — Telephone Encounter (Signed)
Duplicate refill provider has already declined

## 2020-03-01 NOTE — Telephone Encounter (Signed)
Patient notified

## 2020-03-01 NOTE — Telephone Encounter (Signed)
Routing to provider. Dr. Wynetta Emery, I know we refused before for it being to early to fill. Patient given a 15 day supply 02/16/20. But it should be due now. Can we send in a new script for the patient?

## 2020-03-02 ENCOUNTER — Encounter: Payer: Self-pay | Admitting: Family Medicine

## 2020-03-02 ENCOUNTER — Ambulatory Visit: Payer: Medicaid Other | Admitting: Family Medicine

## 2020-03-02 ENCOUNTER — Telehealth (INDEPENDENT_AMBULATORY_CARE_PROVIDER_SITE_OTHER): Payer: Medicaid Other | Admitting: Family Medicine

## 2020-03-02 DIAGNOSIS — Z720 Tobacco use: Secondary | ICD-10-CM | POA: Diagnosis not present

## 2020-03-02 MED ORDER — CHANTIX STARTING MONTH PAK 0.5 MG X 11 & 1 MG X 42 PO TABS: ORAL_TABLET | ORAL | 0 refills | Status: DC

## 2020-03-02 MED ORDER — VARENICLINE TARTRATE 1 MG PO TABS: 1.0000 mg | ORAL_TABLET | Freq: Two times a day (BID) | ORAL | 1 refills | Status: DC

## 2020-03-02 NOTE — Assessment & Plan Note (Signed)
Would like some help to quit smoking. Will start chantix. Recheck 1 month. Call with any concerns.

## 2020-03-02 NOTE — Progress Notes (Signed)
There were no vitals taken for this visit.   Subjective:    Patient ID: Johnny Jewett., male    DOB: 06-Feb-1971, 49 y.o.   MRN: [id number]  HPI: Johnny Terracina. is a 49 y.o. male  Chief Complaint  Patient presents with  . Nicotine Dependence    Would like to discuss options   SMOKING CESSATION Smoking Status: current every day smoker Smoking Amount: 0.5-1ppd Smoking Onset: 49yo Smoking Quit Date: Not set Smoking triggers: stress Type of tobacco use: cigarettes Children in the house: no Other household members who smoke: yes Treatments attempted: wellbutrin (didn't work), cold [place] Pneumovax: not up to date  Relevant past medical, surgical, family and social history reviewed and updated as indicated. Interim medical history since our last visit reviewed. Allergies and medications reviewed and updated.  Review of Systems  Constitutional: Negative.   Respiratory: Negative.   Cardiovascular: Negative.   Gastrointestinal: Negative.   Neurological: Negative.   Psychiatric/Behavioral: Negative.     Per HPI unless specifically indicated above     Objective:    There were no vitals taken for this visit.  Wt Readings from Last 3 Encounters:  02/26/20 206 lb 6.4 oz (93.6 kg)  02/13/20 192 lb (87.1 kg)  01/07/20 189 lb 13.1 oz (86.1 kg)    Physical Exam Vitals and nursing note reviewed.  Constitutional:      General: He is not in acute distress.    Appearance: Normal appearance. He is not ill-appearing, toxic-appearing or diaphoretic.  HENT:     Head: Normocephalic and atraumatic.     Right Ear: External ear normal.     Left Ear: External ear normal.     Nose: Nose normal.     Mouth/Throat:     Mouth: Mucous membranes are moist.     Pharynx: Oropharynx is clear.  Eyes:     General: No scleral icterus.       Right eye: No discharge.        Left eye: No discharge.     Conjunctiva/sclera: Conjunctivae normal.     Pupils: Pupils are equal, round,  and reactive to light.  Pulmonary:     Effort: Pulmonary effort is normal. No respiratory distress.     Comments: Speaking in full sentences Musculoskeletal:        General: Normal range of motion.     Cervical back: Normal range of motion.  Skin:    Coloration: Skin is not jaundiced or pale.     Findings: No bruising, erythema, lesion or rash.  Neurological:     Mental Status: He is alert and oriented to person, place, and time. Mental status is at baseline.  Psychiatric:        Mood and Affect: Mood normal.        Behavior: Behavior normal.        Thought Content: Thought content normal.        Judgment: Judgment normal.     Results for orders placed or performed during the hospital encounter of [id number]  Basic metabolic panel  Result Value Ref Range   Sodium 141 135 - 145 mmol/L   Potassium 3.8 3.5 - 5.1 mmol/L   Chloride 104 98 - 111 mmol/L   CO2 29 22 - 32 mmol/L   Glucose, Bld 92 70 - 99 mg/dL   BUN 9 6 - 20 mg/dL   Creatinine, Ser 1.12 0.61 - 1.24 mg/dL   Calcium 8.7 (L) 8.9 - 10.3  mg/dL   GFR calc non Af Amer >60 >60 mL/min   GFR calc Af Amer >60 >60 mL/min   Anion gap 8 5 - 15  CBC with Differential/Platelet  Result Value Ref Range   WBC 6.4 4.0 - 10.5 K/uL   RBC 3.86 (L) 4.22 - 5.81 MIL/uL   Hemoglobin 12.2 (L) 13.0 - 17.0 g/dL   HCT 36.3 (L) 39.0 - 52.0 %   MCV 94.0 80.0 - 100.0 fL   MCH 31.6 26.0 - 34.0 pg   MCHC 33.6 30.0 - 36.0 g/dL   RDW 13.5 11.5 - 15.5 %   Platelets 218 150 - 400 K/uL   nRBC 0.0 0.0 - 0.2 %   Neutrophils Relative % 50 %   Neutro Abs 3.2 1.7 - 7.7 K/uL   Lymphocytes Relative 32 %   Lymphs Abs 2.1 0.7 - 4.0 K/uL   Monocytes Relative 12 %   Monocytes Absolute 0.8 0.1 - 1.0 K/uL   Eosinophils Relative 5 %   Eosinophils Absolute 0.3 0.0 - 0.5 K/uL   Basophils Relative 1 %   Basophils Absolute 0.1 0.0 - 0.1 K/uL   Immature Granulocytes 0 %   Abs Immature Granulocytes 0.02 0.00 - 0.07 K/uL  Sedimentation rate  Result Value Ref  Range   Sed Rate 5 0 - 16 mm/hr  Vancomycin, trough  Result Value Ref Range   Vancomycin Tr 19 15 - 20 ug/mL      Assessment & Plan:   Problem List Items Addressed This Visit      Other   Tobacco abuse - Primary    Would like some help to quit smoking. Will start chantix. Recheck 1 month. Call with any concerns.           Follow up plan: Return in about 4 weeks (around 03/30/2020).   . This visit was completed via MyChart due to the restrictions of the COVID-19 pandemic. All issues as above were discussed and addressed. Physical exam was done as above through visual confirmation on MyChart. If it was felt that the patient should be evaluated in the office, they were directed there. The patient verbally consented to this visit. . Location of the patient: home . Location of the provider: work . Those involved with this call:  . Provider: Park Liter, DO . CMA: Tiffany Reel, CMA . Front Desk/Registration: Don Perking  . Time spent on call: 15 minutes with patient face to face via video conference. More than 50% of this time was spent in counseling and coordination of care. 23 minutes total spent in review of patient's record and preparation of their chart.

## 2020-03-04 ENCOUNTER — Telehealth: Payer: Self-pay | Admitting: Family Medicine

## 2020-03-04 NOTE — Telephone Encounter (Signed)
Phone call to pt.  Noted that he was seen at [facility] today.  Stated he just left the appt. at [facility], and was advised to contact his PCP for pain medication at present time.  Per patient, he was advised that the Pain Clinic will not be able to give him any Rx for pain for one month, until he returns to their office, and that he should contact his PCP. Reported his pain in in his lower back and right knee.  Also reported he has an umbilical hernia that has recurred, and is scheduled for a CT scan tomorrow, to further evaluate this problem.  Advised that Dr. Wynetta Emery would not be able to prescribe pain medication without seeing pt. In office.  Pt. In agreement to make an appt..  Scheduled OV with PCP 03/05/20 @ 1:30 PM.  Agreed with plan.

## 2020-03-04 NOTE — Telephone Encounter (Signed)
Pt called stating that he saw pain management and they advised they would not be able to prescribe him with any medication for a month and tod him to reach out to pcp to get something until then. Please advise.    [id number] [place], [place] - [address]  [address] [place] [address]  Phone: [phone] Fax: [phone]  Not a 24 hour pharmacy; exact hours not known.

## 2020-03-05 ENCOUNTER — Ambulatory Visit
Admission: RE | Admit: 2020-03-05 | Discharge: 2020-03-05 | Disposition: A | Discharge status: Home / Self Care | Payer: Medicaid Other | Source: Ambulatory Visit | Attending: General Surgery | Admitting: General Surgery

## 2020-03-05 ENCOUNTER — Other Ambulatory Visit: Payer: Self-pay

## 2020-03-05 ENCOUNTER — Ambulatory Visit: Payer: Medicaid Other | Admitting: Family Medicine

## 2020-03-05 DIAGNOSIS — K429 Umbilical hernia without obstruction or gangrene: Secondary | ICD-10-CM | POA: Insufficient documentation

## 2020-03-05 MED ORDER — IOHEXOL 300 MG/ML  SOLN
100.0000 mL | Freq: Once | INTRAMUSCULAR | Status: AC | PRN
  Administered 2020-03-05: 100 mL via INTRAVENOUS

## 2020-03-08 ENCOUNTER — Other Ambulatory Visit: Payer: Self-pay | Admitting: Family Medicine

## 2020-03-08 ENCOUNTER — Telehealth: Payer: Self-pay | Admitting: General Surgery

## 2020-03-08 NOTE — Telephone Encounter (Signed)
Requested medication (s) are due for refill today: yes  Requested medication (s) are on the active medication list: yes  Last refill:  03/01/20  Future visit scheduled: yes  Notes to clinic:  not delegated    Requested Prescriptions  Pending Prescriptions Disp Refills   tiZANidine (ZANAFLEX) 4 MG tablet [Pharmacy Med Name: TIZANIDINE HCL 4 MG TABLET] 60 tablet 0    Sig: Take 1 tablet (4 mg total) by mouth every 6 (six) hours as needed for muscle spasms.      Not Delegated - Cardiovascular:  Alpha-2 Agonists - tizanidine Failed - 03/08/2020 10:34 AM      Failed - This refill cannot be delegated      Passed - Valid encounter within last 6 months    Recent Outpatient Visits           6 days ago Tobacco abuse   [place], [place], DO   3 weeks ago [place], [place], [place]   3 months ago Appointment canceled by hospital   [facility], Megan P, DO   3 months ago Chronic pain syndrome   [place], DO   4 months ago Cough   [place], Barb Merino, DO       Future Appointments             In 3 weeks Wynetta Emery, Barb Merino, DO [facility], [facility]

## 2020-03-08 NOTE — Telephone Encounter (Signed)
Refill requested to soon, given 15 day supply 03/01/20

## 2020-03-08 NOTE — Telephone Encounter (Signed)
Discussed CT results w/patient. Umbilical hernia recurrence.  Also has small left inguinal hernia.  Will make arrangements to repair both.

## 2020-03-09 ENCOUNTER — Other Ambulatory Visit: Payer: Self-pay | Admitting: Family Medicine

## 2020-03-09 ENCOUNTER — Telehealth: Payer: Self-pay | Admitting: General Surgery

## 2020-03-09 NOTE — Telephone Encounter (Signed)
Requested medication (s) are due for refill today: yes  Requested medication (s) are on the active medication list: yes  Last refill: 03/01/20  Future visit scheduled: yes  Notes to clinic:  not delegated    Requested Prescriptions  Pending Prescriptions Disp Refills   tiZANidine (ZANAFLEX) 4 MG tablet [Pharmacy Med Name: TIZANIDINE HCL 4 MG TABLET] 60 tablet 0    Sig: Take 1 tablet (4 mg total) by mouth every 6 (six) hours as needed for muscle spasms.      Not Delegated - Cardiovascular:  Alpha-2 Agonists - tizanidine Failed - 03/09/2020  9:25 AM      Failed - This refill cannot be delegated      Passed - Valid encounter within last 6 months    Recent Outpatient Visits           1 week ago Tobacco abuse   [place], [place], DO   3 weeks ago [place], [place], [place]   3 months ago Appointment canceled by hospital   [facility], Megan P, DO   3 months ago Chronic pain syndrome   [place], DO   4 months ago Cough   [place], Barb Merino, DO       Future Appointments             In 3 weeks Wynetta Emery, Barb Merino, DO [facility], [facility]

## 2020-03-09 NOTE — Telephone Encounter (Signed)
Outbound call made to the pt advising of changes still being made to potential surgery dates/times; therefore an official call will be made once surgery date, pre-admit & COVID testing dates are confirmed. Pt acknowledged understanding & thanked.  He will await the call.  Thank you

## 2020-03-09 NOTE — Telephone Encounter (Signed)
Patient called and was given all surgery information.

## 2020-03-10 NOTE — Telephone Encounter (Signed)
Pt has been advised of pre admission date/time, Covid Testing date and Surgery date.  Surgery Date: 03/24/20 Preadmission Testing Date: 03/12/20 (phone [id number]) Covid Testing Date: 03/22/20 - patient advised to go to the [place] ([place])  Patient has been made aware to call [phone], between 1-3:00pm the day before surgery, to find out what time to arrive.

## 2020-03-12 ENCOUNTER — Other Ambulatory Visit: Payer: Self-pay

## 2020-03-12 ENCOUNTER — Other Ambulatory Visit: Payer: Self-pay | Admitting: General Surgery

## 2020-03-12 ENCOUNTER — Other Ambulatory Visit
Admission: RE | Admit: 2020-03-12 | Discharge: 2020-03-12 | Disposition: A | Discharge status: Home / Self Care | Payer: Medicaid Other | Source: Ambulatory Visit | Attending: General Surgery | Admitting: General Surgery

## 2020-03-12 DIAGNOSIS — Z01818 Encounter for other preprocedural examination: Secondary | ICD-10-CM | POA: Insufficient documentation

## 2020-03-12 DIAGNOSIS — K429 Umbilical hernia without obstruction or gangrene: Secondary | ICD-10-CM

## 2020-03-12 DIAGNOSIS — G8929 Other chronic pain: Secondary | ICD-10-CM

## 2020-03-12 HISTORY — DX: Other chronic pain: G89.29

## 2020-03-12 NOTE — Patient Instructions (Signed)
INSTRUCTIONS FOR SURGERY     Your surgery is scheduled for:   Wednesday, MARCH 31ST     To find out your arrival time for the day of surgery,          please call [phone] between 1 pm and 3 pm on :  Tuesday, MARCH 30TH     When you arrive for surgery, report to the [facility].       Do NOT stop on the first floor to register.    REMEMBER: Instructions that are not followed completely may result in serious medical risk,  up to and including death, or upon the discretion of your surgeon and anesthesiologist,            your surgery may need to be rescheduled.  __X__ 1. Do not eat food after midnight the night before your procedure.                    No gum, candy, lozenger, tic tacs, tums or hard candies.                  ABSOLUTELY NOTHING SOLID IN YOUR MOUTH AFTER MIDNIGHT                    You may drink unlimited clear liquids up to 2 hours before you are scheduled to arrive for surgery.                   Do not drink anything within those 2 hours unless you need to take medicine, then take the                   smallest amount you need.  Clear liquids include:  water, apple juice without pulp,                   any flavor Gatorade, Black coffee, black tea.  Sugar may be added but no dairy/ honey /lemon.                        Broth and jello is not considered a clear liquid.  __x__  2. On the morning of surgery, please brush your teeth with toothpaste and water. You may rinse with                  mouthwash if you wish but DO NOT SWALLOW TOOTHPASTE OR MOUTHWASH  __X___3. NO alcohol for 24 hours before or after surgery.  __x___ 4.  Do NOT smoke or use e-cigarettes for 24 HOURS PRIOR TO SURGERY.                      DO NOT Use any chewable tobacco products for at least 6 hours prior to surgery.  __x___ 5. If you start any new medication after this appointment and prior to surgery, please       Bring it with you on the day of surgery.  ___x__ 6. Notify your doctor if there is any change in your medical condition, such as fever,  infection, vomitting, diarrhea or any open sores.  __x___ 7.  USE the CHG SOAP as instructed, the night before surgery and the day of surgery.                   Once you have washed with this soap, do NOT use any of the following: Powders, perfumes                    or lotions. Please do not wear make up, hairpins, clips or nail polish. You MAY  wear deodorant.                   Men may shave their face and neck.  Women need to shave 48 hours prior to surgery.                   DO NOT wear ANY jewelry on the day of surgery. If there are rings that are too tight to                    remove easily, please address this prior to the surgery day. Piercings need to be removed.                                                                     NO METAL ON YOUR BODY.                    Do NOT bring any valuables.  If you came to Pre-Admit testing then you will not need license,                     insurance card or credit card.  If you will be staying overnight, please either leave your things in                     the car or have your family be responsible for these items.                     [facility] IS NOT RESPONSIBLE FOR BELONGINGS OR VALUABLES.  ___X__ 8. DO NOT wear contact lenses on surgery day.  You may not have dentures,                     Hearing aides, contacts or glasses in the operating room. These items can be                    Placed in the Recovery Room to receive immediately after surgery.  __x___ 9. IF YOU ARE SCHEDULED TO GO HOME ON THE SAME DAY, YOU MUST                   Have someone to drive you home and to stay with you  for the first 24 hours.                    Have an arrangement prior to arriving on surgery day.  ___x__ 10. Take the following medications on the morning of surgery with a sip of water:  1.  GABAPENTIN                     2.  ZANAFLEX                     3.  CHANTIX, if you have started it and can tolerate on an empty stomach                     4.                                ___x__ 11.  Follow any instructions provided to you by your surgeon.                        Such as enema, clear liquid bowel prep                      ##PLEASE DRINK ALL OF THE SURGICAL CARB DRINK ON THE DAY OF                        SURGERY.  HAVE IT COMPLETED 2 HOURS PRIOR TO ARRIVAL TO                         [place].##  __X__  12. STOP ALL  ASPIRIN PRODUCTS AS OF: TODAY, MARCH 19TH                       THIS INCLUDES BC POWDERS / GOODIES POWDER  __x___ 13. STOP Anti-inflammatories as of: TODAY, MARCH 19TH                      This includes IBUPROFEN / MOTRIN / ADVIL / ALEVE/ NAPROXYN                    YOU MAY TAKE TYLENOL ANY TIME PRIOR TO SURGERY.   _____18.   Wear clean, comfortable AND LOOSE FITTING clothing to the hospital.  HAVE PHONE NUMBERS FOR ANY CONTACTS THAT WE WILL NEED TO CALL.

## 2020-03-12 NOTE — Pre-Procedure Instructions (Signed)
Spoke with patient for preop interview.  He states that his homeless shelter runs out as of April 1st. His plan for day of surgery is to stay in a hotel with a friend, Johnny [name]. This is what he did after previous general surgery.  He also stated that he has not signed a contract with the [facility] as he must see a psychiatrist first. That appointment is scheduled for May. Therefore, he does not have a pain med contract and is without any pain medication.  Dr. [name] may order post op meds for him. Patient also states that his back incision has finally closed.

## 2020-03-15 ENCOUNTER — Other Ambulatory Visit: Payer: Medicaid Other

## 2020-03-17 ENCOUNTER — Telehealth: Payer: Medicaid Other

## 2020-03-18 ENCOUNTER — Other Ambulatory Visit: Payer: Self-pay

## 2020-03-18 NOTE — Congregational Nurse Program (Signed)
Client here to discuss follow-up plans for surgery on 03/24/20,He has been placed in a boarding house from [place]. He had a person to take him to his surgical appt. But he has not been able to get in touch with her. He c/o pain and has been in Pain clinic but is no longer going there. His B/P was 152/104. Umbilical hernia visible. His lower back scar in healed and intact.

## 2020-03-22 ENCOUNTER — Other Ambulatory Visit
Admission: RE | Admit: 2020-03-22 | Discharge: 2020-03-22 | Disposition: A | Discharge status: Home / Self Care | Payer: Medicaid Other | Source: Ambulatory Visit | Attending: General Surgery | Admitting: General Surgery

## 2020-03-22 ENCOUNTER — Other Ambulatory Visit: Payer: Self-pay | Admitting: Family Medicine

## 2020-03-22 DIAGNOSIS — Z01812 Encounter for preprocedural laboratory examination: Secondary | ICD-10-CM | POA: Insufficient documentation

## 2020-03-22 DIAGNOSIS — Z20822 Contact with and (suspected) exposure to covid-19: Secondary | ICD-10-CM | POA: Insufficient documentation

## 2020-03-22 LAB — SARS CORONAVIRUS 2 (TAT 6-24 HRS): SARS Coronavirus 2: NEGATIVE

## 2020-03-22 MED ORDER — TIZANIDINE HCL 4 MG PO TABS: 4.0000 mg | ORAL_TABLET | Freq: Four times a day (QID) | ORAL | 0 refills | Status: DC | PRN

## 2020-03-22 NOTE — Telephone Encounter (Signed)
Pt called. Pt states that he takes Gabapentin 800 mg and 600 mg, three times a day. Gabapentin 800 mg last refilled by historical provider.Patient notified that refills for Gabapentin 600 mg are available at the pharmacy. Understanding verbalized.  Last refill of Tizanidine on 03/01/20 #60.

## 2020-03-22 NOTE — Telephone Encounter (Signed)
Medication Refill - Medication: gabapentin (NEURONTIN) 600 MG tablet gabapentin (NEURONTIN) 800 MG tablet tiZANidine (ZANAFLEX) 4 MG tablet   Preferred Pharmacy (with phone number or street name):  [id number] - [place], [place] [address] Phone:  [phone]  Fax:  [phone]       Agent: Please be advised that RX refills may take up to 3 business days. We ask that you follow-up with your pharmacy.

## 2020-03-22 NOTE — Telephone Encounter (Signed)
Routing to provider  

## 2020-03-24 ENCOUNTER — Other Ambulatory Visit: Payer: Self-pay

## 2020-03-24 ENCOUNTER — Ambulatory Visit
Admission: RE | Admit: 2020-03-24 | Discharge: 2020-03-24 | Disposition: A | Discharge status: Home / Self Care | Payer: Medicaid Other | Attending: General Surgery | Admitting: General Surgery

## 2020-03-24 ENCOUNTER — Encounter: Payer: Self-pay | Admitting: Certified Registered"

## 2020-03-24 ENCOUNTER — Encounter: Payer: Self-pay | Admitting: General Surgery

## 2020-03-24 ENCOUNTER — Encounter
Admission: RE | Disposition: A | Discharge status: Home / Self Care | Payer: Self-pay | Source: Home / Self Care | Attending: General Surgery

## 2020-03-24 DIAGNOSIS — Z5309 Procedure and treatment not carried out because of other contraindication: Secondary | ICD-10-CM | POA: Diagnosis not present

## 2020-03-24 DIAGNOSIS — K429 Umbilical hernia without obstruction or gangrene: Secondary | ICD-10-CM | POA: Diagnosis not present

## 2020-03-24 LAB — URINE DRUG SCREEN, QUALITATIVE (ARMC ONLY)
Amphetamines, Ur Screen: POSITIVE — AB
Barbiturates, Ur Screen: NOT DETECTED
Benzodiazepine, Ur Scrn: NOT DETECTED
Cannabinoid 50 Ng, Ur ~~LOC~~: POSITIVE — AB
Cocaine Metabolite,Ur ~~LOC~~: POSITIVE — AB
MDMA (Ecstasy)Ur Screen: NOT DETECTED
Methadone Scn, Ur: NOT DETECTED
Opiate, Ur Screen: NOT DETECTED
Phencyclidine (PCP) Ur S: NOT DETECTED
Tricyclic, Ur Screen: NOT DETECTED

## 2020-03-24 SURGERY — REPAIR, HERNIA, UMBILICAL, ADULT
Anesthesia: General

## 2020-03-24 MED ORDER — CELECOXIB 200 MG PO CAPS: 200.0000 mg | ORAL_CAPSULE | ORAL | Status: DC

## 2020-03-24 MED ORDER — FAMOTIDINE 20 MG PO TABS
ORAL_TABLET | ORAL | Status: AC
  Filled 2020-03-24: qty 1

## 2020-03-24 MED ORDER — PROPOFOL 10 MG/ML IV BOLUS
INTRAVENOUS | Status: AC
  Filled 2020-03-24: qty 40

## 2020-03-24 MED ORDER — FENTANYL CITRATE (PF) 100 MCG/2ML IJ SOLN
INTRAMUSCULAR | Status: AC
  Filled 2020-03-24: qty 2

## 2020-03-24 MED ORDER — MIDAZOLAM HCL 2 MG/2ML IJ SOLN
INTRAMUSCULAR | Status: AC
  Filled 2020-03-24: qty 2

## 2020-03-24 MED ORDER — LACTATED RINGERS IV SOLN: INTRAVENOUS | Status: DC

## 2020-03-24 MED ORDER — FAMOTIDINE 20 MG PO TABS: 20.0000 mg | ORAL_TABLET | Freq: Once | ORAL | Status: DC

## 2020-03-24 MED ORDER — BUPIVACAINE LIPOSOME 1.3 % IJ SUSP: 20.0000 mL | Freq: Once | INTRAMUSCULAR | Status: DC

## 2020-03-24 MED ORDER — CHLORHEXIDINE GLUCONATE CLOTH 2 % EX PADS: 6.0000 | MEDICATED_PAD | Freq: Once | CUTANEOUS | Status: DC

## 2020-03-24 MED ORDER — BUPIVACAINE HCL (PF) 0.25 % IJ SOLN
INTRAMUSCULAR | Status: AC
  Filled 2020-03-24: qty 30

## 2020-03-24 MED ORDER — CEFAZOLIN SODIUM-DEXTROSE 2-4 GM/100ML-% IV SOLN: 2.0000 g | INTRAVENOUS | Status: DC

## 2020-03-24 MED ORDER — LIDOCAINE-EPINEPHRINE 1 %-1:100000 IJ SOLN
INTRAMUSCULAR | Status: AC
  Filled 2020-03-24: qty 1

## 2020-03-24 MED ORDER — CEFAZOLIN SODIUM-DEXTROSE 2-4 GM/100ML-% IV SOLN
INTRAVENOUS | Status: AC
  Filled 2020-03-24: qty 100

## 2020-03-24 MED ORDER — BUPIVACAINE LIPOSOME 1.3 % IJ SUSP
INTRAMUSCULAR | Status: AC
  Filled 2020-03-24: qty 40

## 2020-03-24 MED ORDER — CELECOXIB 200 MG PO CAPS
ORAL_CAPSULE | ORAL | Status: AC
  Filled 2020-03-24: qty 1

## 2020-03-24 MED ORDER — ACETAMINOPHEN 500 MG PO TABS: 1000.0000 mg | ORAL_TABLET | ORAL | Status: DC

## 2020-03-24 MED ORDER — ACETAMINOPHEN 500 MG PO TABS
ORAL_TABLET | ORAL | Status: AC
  Filled 2020-03-24: qty 2

## 2020-03-24 SURGICAL SUPPLY — 41 items
ADH SKN CLS APL DERMABOND .7 (GAUZE/BANDAGES/DRESSINGS) ×2
APL PRP STRL LF DISP 70% ISPRP (MISCELLANEOUS) ×2
BLADE CLIPPER SURG (BLADE) ×4 IMPLANT
BLADE SURG 15 STRL LF DISP TIS (BLADE) ×3 IMPLANT
BLADE SURG 15 STRL SS (BLADE) ×3
CANISTER SUCT 1200ML W/VALVE (MISCELLANEOUS) ×4 IMPLANT
CHLORAPREP W/TINT 26 (MISCELLANEOUS) ×4 IMPLANT
COVER WAND RF STERILE (DRAPES) ×4 IMPLANT
DERMABOND ADVANCED (GAUZE/BANDAGES/DRESSINGS) ×1
DERMABOND ADVANCED .7 DNX12 (GAUZE/BANDAGES/DRESSINGS) ×3 IMPLANT
DRAIN PENROSE 5/8X18 LTX STRL (DRAIN) ×4 IMPLANT
DRAPE LAPAROTOMY 77X122 PED (DRAPES) ×4 IMPLANT
ELECT CAUTERY BLADE TIP 2.5 (TIP) ×3
ELECT REM PT RETURN 9FT ADLT (ELECTROSURGICAL) ×3
ELECTRODE CAUTERY BLDE TIP 2.5 (TIP) ×3 IMPLANT
ELECTRODE REM PT RTRN 9FT ADLT (ELECTROSURGICAL) ×3 IMPLANT
GLOVE BIO SURGEON STRL SZ 6.5 (GLOVE) ×4 IMPLANT
GLOVE INDICATOR 7.0 STRL GRN (GLOVE) ×8 IMPLANT
GOWN STRL REUS W/ TWL LRG LVL3 (GOWN DISPOSABLE) ×6 IMPLANT
GOWN STRL REUS W/TWL LRG LVL3 (GOWN DISPOSABLE) ×6
KIT TURNOVER KIT A (KITS) ×4 IMPLANT
LABEL OR SOLS (LABEL) ×4 IMPLANT
NEEDLE HYPO 22GX1.5 SAFETY (NEEDLE) ×8 IMPLANT
NS IRRIG 500ML POUR BTL (IV SOLUTION) ×4 IMPLANT
PACK BASIN MINOR ARMC (MISCELLANEOUS) ×4 IMPLANT
SPONGE KITTNER 5P (MISCELLANEOUS) ×4 IMPLANT
SPONGE LAP 18X18 RF (DISPOSABLE) ×4 IMPLANT
STRIP CLOSURE SKIN 1/2X4 (GAUZE/BANDAGES/DRESSINGS) ×4 IMPLANT
SUT ETHIBOND NAB MO 7 #0 18IN (SUTURE) IMPLANT
SUT MNCRL 4-0 (SUTURE) ×3
SUT MNCRL 4-0 27XMFL (SUTURE) ×2
SUT PROLENE 2 0 SH DA (SUTURE) IMPLANT
SUT VIC AB 2-0 SH 27 (SUTURE)
SUT VIC AB 2-0 SH 27XBRD (SUTURE) IMPLANT
SUT VIC AB 3-0 SH 27 (SUTURE) ×3
SUT VIC AB 3-0 SH 27X BRD (SUTURE) ×3 IMPLANT
SUT VICRYL 2-0 SH 8X27 (SUTURE) IMPLANT
SUTURE MNCRL 4-0 27XMF (SUTURE) ×3 IMPLANT
SYR 10ML LL (SYRINGE) ×4 IMPLANT
SYR 20ML LL LF (SYRINGE) ×4 IMPLANT
SYR BULB IRRIG 60ML STRL (SYRINGE) ×4 IMPLANT

## 2020-03-24 NOTE — Interval H&P Note (Signed)
History and Physical Interval Note:  03/24/2020 7:16 AM  Johnny [name].  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  The various methods of treatment have been discussed with the patient and family. After consideration of risks, benefits and other options for treatment, the patient has consented to  Procedure(s) with comments: HERNIA REPAIR UMBILICAL ADULT (N/A) - requesting RNFA [name] (Left) as a surgical intervention.  Urine drug screen positive for cocaine.  Will cancel surgery today and reschedule.  Patient was warned that a second positive drug test may result in him needing to find alternate care.  [name]

## 2020-03-28 ENCOUNTER — Emergency Department
Admission: EM | Admit: 2020-03-28 | Discharge: 2020-03-30 | Disposition: A | Discharge status: Other Acute Inpatient Hospital | Payer: Medicaid Other | Attending: Emergency Medicine | Admitting: Emergency Medicine

## 2020-03-28 ENCOUNTER — Other Ambulatory Visit: Payer: Self-pay

## 2020-03-28 ENCOUNTER — Encounter: Payer: Self-pay | Admitting: Emergency Medicine

## 2020-03-28 ENCOUNTER — Emergency Department: Payer: Medicaid Other

## 2020-03-28 DIAGNOSIS — Z96651 Presence of right artificial knee joint: Secondary | ICD-10-CM | POA: Insufficient documentation

## 2020-03-28 DIAGNOSIS — F333 Major depressive disorder, recurrent, severe with psychotic symptoms: Secondary | ICD-10-CM | POA: Insufficient documentation

## 2020-03-28 DIAGNOSIS — Z79899 Other long term (current) drug therapy: Secondary | ICD-10-CM | POA: Insufficient documentation

## 2020-03-28 DIAGNOSIS — R45851 Suicidal ideations: Secondary | ICD-10-CM | POA: Insufficient documentation

## 2020-03-28 DIAGNOSIS — F1410 Cocaine abuse, uncomplicated: Secondary | ICD-10-CM | POA: Diagnosis not present

## 2020-03-28 DIAGNOSIS — Z20822 Contact with and (suspected) exposure to covid-19: Secondary | ICD-10-CM | POA: Insufficient documentation

## 2020-03-28 DIAGNOSIS — Z91040 Latex allergy status: Secondary | ICD-10-CM | POA: Diagnosis not present

## 2020-03-28 DIAGNOSIS — F17210 Nicotine dependence, cigarettes, uncomplicated: Secondary | ICD-10-CM | POA: Insufficient documentation

## 2020-03-28 DIAGNOSIS — R440 Auditory hallucinations: Secondary | ICD-10-CM | POA: Diagnosis present

## 2020-03-28 LAB — URINE DRUG SCREEN, QUALITATIVE (ARMC ONLY)
Amphetamines, Ur Screen: NOT DETECTED
Barbiturates, Ur Screen: NOT DETECTED
Benzodiazepine, Ur Scrn: NOT DETECTED
Cannabinoid 50 Ng, Ur ~~LOC~~: NOT DETECTED
Cocaine Metabolite,Ur ~~LOC~~: POSITIVE — AB
MDMA (Ecstasy)Ur Screen: NOT DETECTED
Methadone Scn, Ur: NOT DETECTED
Opiate, Ur Screen: NOT DETECTED
Phencyclidine (PCP) Ur S: NOT DETECTED
Tricyclic, Ur Screen: NOT DETECTED

## 2020-03-28 LAB — COMPREHENSIVE METABOLIC PANEL
ALT: 22 U/L (ref 0–44)
AST: 28 U/L (ref 15–41)
Albumin: 4.6 g/dL (ref 3.5–5.0)
Alkaline Phosphatase: 125 U/L (ref 38–126)
Anion gap: 11 (ref 5–15)
BUN: 7 mg/dL (ref 6–20)
CO2: 23 mmol/L (ref 22–32)
Calcium: 9.1 mg/dL (ref 8.9–10.3)
Chloride: 102 mmol/L (ref 98–111)
Creatinine, Ser: 1.03 mg/dL (ref 0.61–1.24)
GFR calc Af Amer: >60 mL/min (ref >60)
GFR calc non Af Amer: >60 mL/min (ref >60)
Glucose, Bld: 104 mg/dL — ABNORMAL HIGH (ref 70–99)
Potassium: 3.5 mmol/L (ref 3.5–5.1)
Sodium: 136 mmol/L (ref 135–145)
Total Bilirubin: 0.8 mg/dL (ref 0.3–1.2)
Total Protein: 7.8 g/dL (ref 6.5–8.1)

## 2020-03-28 LAB — ACETAMINOPHEN LEVEL: Acetaminophen (Tylenol), Serum: <10 ug/mL — ABNORMAL LOW (ref 10–30)

## 2020-03-28 LAB — CBC
HCT: 44.1 % (ref 39.0–52.0)
Hemoglobin: 16.1 g/dL (ref 13.0–17.0)
MCH: 31.6 pg (ref 26.0–34.0)
MCHC: 36.5 g/dL — ABNORMAL HIGH (ref 30.0–36.0)
MCV: 86.5 fL (ref 80.0–100.0)
Platelets: 246 10*3/uL (ref 150–400)
RBC: 5.1 MIL/uL (ref 4.22–5.81)
RDW: 13.2 % (ref 11.5–15.5)
WBC: 10.4 10*3/uL (ref 4.0–10.5)
nRBC: 0 % (ref 0.0–0.2)

## 2020-03-28 LAB — RESPIRATORY PANEL BY RT PCR (FLU A&B, COVID)
Influenza A by PCR: NEGATIVE
Influenza B by PCR: NEGATIVE
SARS Coronavirus 2 by RT PCR: NEGATIVE

## 2020-03-28 LAB — SALICYLATE LEVEL: Salicylate Lvl: <7 mg/dL — ABNORMAL LOW (ref 7.0–30.0)

## 2020-03-28 LAB — ETHANOL: Alcohol, Ethyl (B): <10 mg/dL (ref <10)

## 2020-03-28 MED ORDER — GABAPENTIN 600 MG PO TABS
1200.0000 mg | ORAL_TABLET | Freq: Three times a day (TID) | ORAL | Status: DC
  Administered 2020-03-29 – 2020-03-30 (×5): 1200 mg via ORAL
  Filled 2020-03-28 (×5): qty 2

## 2020-03-28 MED ORDER — LORAZEPAM 2 MG PO TABS
2.0000 mg | ORAL_TABLET | Freq: Once | ORAL | Status: AC
  Administered 2020-03-28: 2 mg via ORAL
  Filled 2020-03-28: qty 1

## 2020-03-28 MED ORDER — TIZANIDINE HCL 4 MG PO TABS
4.0000 mg | ORAL_TABLET | Freq: Four times a day (QID) | ORAL | Status: DC | PRN
  Filled 2020-03-28: qty 1

## 2020-03-28 NOTE — ED Triage Notes (Signed)
Pt to ER with [facility] voluntarily.  Pt states does not remember the last time he ate or slept.  States he smoked crack all last night.  States he has been  Hearing voices and seeing things.  States he has anxiety and anger issues.  PT calm and cooperative in triage.

## 2020-03-28 NOTE — ED Notes (Signed)
Patient evaluated by tele psych recommends patient admission for SI.

## 2020-03-28 NOTE — ED Notes (Signed)
TTS at bedside to consult patient.

## 2020-03-28 NOTE — ED Notes (Signed)
Pt reports to this RN that the camera cut off while he was talking to the Psych MD and that he was not finished talking. Feels like he has more to say to the doctor. Will attempt to get [facility] MD to reconnect and talk with pt or ask Dr Ellender Hose if ok to order second [place].

## 2020-03-28 NOTE — ED Provider Notes (Signed)
[facility] Emergency Department Provider Note  ____________________________________________   First MD Initiated Contact with Patient 03/28/20 1242     (approximate)  I have reviewed the triage vital signs and the nursing notes.   HISTORY  Chief Complaint Psychiatric Evaluation    HPI Johnny Curren. is a 49 y.o. male chronic pain who comes in voluntary.  Patient last used crack last night.  He has been hearing voices.  States he started hearing voices before he was using drugs.  He is unsure exactly what they are telling him.  He does report SI, does not have a plan, moderate, nothing makes it better, nothing makes it worse.  He reports feeling depressed for the last few months.  He also endorses a little anxiety as well.  He denies any chest pain, shortness of breath, fevers.  Otherwise has been feeling well.  He states that he just feels like he wants to burst out at people.          Past Medical History:  Diagnosis Date  . Arthritis    hands, knees, lower back  . Bilateral sciatica   . Carpal tunnel syndrome   . Chronic back pain   . Chronic neck pain   . Chronic pain 03/12/2020   has been connected to pain management clinics. currently waiting to see psychiatrist before he can join [facility]  . Chronic pain syndrome   . GERD (gastroesophageal reflux disease)   . Hand joint pain 07/02/2013  . Herniated disc   . Lives in homeless shelter 02/2020   housing ends as of 03/25/20. going to hotel after surgery  . Traumatic amputation of left index finger 2013  . Wears dentures    full upper and lower    Patient Active Problem List   Diagnosis Date Noted  . Wound dehiscence 12/19/2019  . Wound infection after surgery 12/06/2019  . CSF leak 11/28/2019  . Postoperative CSF leak 11/28/2019  . HNP (herniated nucleus pulposus), lumbar 11/18/2019  . Umbilical hernia without obstruction and without gangrene 10/23/2019  . Osteoarthritis of  spine with radiculopathy, lumbar region 10/09/2019  . Chronic bilateral low back pain with bilateral sciatica 10/07/2019  . S/P cervical spinal fusion (C5-C6 ACDF) 07/15/2019  . Cervical radicular pain 07/15/2019  . Cervical facet joint syndrome 07/15/2019  . Degeneration of lumbar intervertebral disc 07/03/2019  . Lumbar radicular pain 07/03/2019  . Cervical spondylosis 04/17/2019  . Homeless single person 04/17/2019  . History of total knee replacement, right 04/25/2018  . Pancreatic mass 01/14/2018  . Tobacco abuse 01/14/2018  . Chronic neck pain   . Benign neoplasm of descending colon   . Benign neoplasm of ascending colon   . Intractable vomiting with nausea   . Reflux esophagitis   . Duodenal ulcer without hemorrhage or perforation   . Chronic pain syndrome 08/07/2017  . Vitamin D deficiency 06/20/2017  . Insomnia 06/19/2017  . Right sided sciatica   . Presence of artificial knee joint 01/17/2013  . Knee pain 01/17/2013  . 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 AMPUTATION FINGER / THUMB Left 2013  . COLONOSCOPY WITH PROPOFOL N/A 01/03/2018   Procedure: COLONOSCOPY WITH PROPOFOL;  Surgeon: [name], MD;  Location: [place];  Service: Endoscopy;  Laterality: N/A;  . ESOPHAGOGASTRODUODENOSCOPY (EGD) WITH PROPOFOL N/A 01/03/2018   Procedure: ESOPHAGOGASTRODUODENOSCOPY (EGD) WITH PROPOFOL;  Surgeon: [name], MD;  Location: [place];  Service: Endoscopy;  Laterality: N/A;  . HERNIA REPAIR Right    right inguinal hernia repaired twice  . HIP SURGERY  2006   took bone out of hip to put into neck  . JOINT REPLACEMENT Right    knee  . KNEE SURGERY Right   . LUMBAR LAMINECTOMY/DECOMPRESSION MICRODISCECTOMY N/A 11/18/2019   Procedure: LUMBAR FOUR-FIVE LUMBAR LAMINECTOMY/DECOMPRESSION MICRODISCECTOMY;  Surgeon: [name], MD;  Location: [place];   Service: Neurosurgery;  Laterality: N/A;  . LUMBAR WOUND DEBRIDEMENT N/A 12/06/2019   Procedure: LUMBAR WOUND REVISION;  Surgeon: [name], MD;  Location: [place];  Service: Neurosurgery;  Laterality: N/A;  . neck fusion    . POLYPECTOMY N/A 01/03/2018   Procedure: POLYPECTOMY;  Surgeon: [name], MD;  Location: [place];  Service: Endoscopy;  Laterality: N/A;  . REPAIR OF CEREBROSPINAL FLUID LEAK N/A 11/28/2019   Procedure: REPAIR OF CEREBROSPINAL FLUID LEAK/LUMBAR;  Surgeon: [name], MD;  Location: [place];  Service: Neurosurgery;  Laterality: N/A;  REPAIR OF CEREBROSPINAL FLUID LEAK/LUMBAR  . REPLACEMENT TOTAL KNEE Right   . SHOULDER SURGERY Right 2016 X 2  . SPINAL FUSION    . TONSILLECTOMY    . UMBILICAL HERNIA REPAIR N/A 11/07/2019   Procedure: HERNIA REPAIR UMBILICAL ADULT;  Surgeon: [name], MD;  Location: [facility];  Service: General;  Laterality: N/A;  . WOUND EXPLORATION N/A 12/15/2019   Procedure: LUMBAR WOUND EXPLORATION;  Surgeon: [name], MD;  Location: [place];  Service: Neurosurgery;  Laterality: N/A;  LUMBAR WOUND EXPLORATION    Prior to Admission medications   Medication Sig Start Date End Date Taking? Authorizing Provider  diphenhydramine-acetaminophen (TYLENOL PM) 25-500 MG TABS tablet Take 1 tablet by mouth at bedtime as needed (sleep).     [provider]  gabapentin (NEURONTIN) 600 MG tablet Take 2 tablets (1,200 mg total) by mouth 3 (three) times daily. Patient taking differently: Take 600 mg by mouth 3 (three) times daily.  12/04/19   Johnson, Megan P, DO  gabapentin (NEURONTIN) 800 MG tablet Take 800 mg by mouth 3 (three) times daily.     [provider]  tiZANidine (ZANAFLEX) 4 MG tablet Take 1 tablet (4 mg total) by mouth every 6 (six) hours as needed for muscle spasms. 03/22/20   Johnson, Megan P, DO  Triamcinolone Acetonide (GOODSENSE NASAL ALLERGY SPRAY NA) Place 1 spray into the nose daily as needed  (allergies).    [provider]  varenicline (CHANTIX CONTINUING MONTH PAK) 1 MG tablet Take 1 tablet (1 mg total) by mouth 2 (two) times daily. 03/02/20   Johnson, Megan P, DO  varenicline (CHANTIX STARTING MONTH PAK) 0.5 MG X 11 & 1 MG X 42 tablet Take one 0.5 mg tablet by mouth once daily for 3 days, then increase to one 0.5 mg tablet twice daily for 4 days, then increase to one 1 mg tablet twice daily. 03/02/20   Valerie Roys, DO    Allergies Crab [shellfish allergy], Flexeril [cyclobenzaprine], Codeine, Latex, Robaxin [methocarbamol], Sulfamethoxazole-trimethoprim, and Tramadol  Family History  Problem Relation Age of Onset  . Cancer Father        sarcoma  . Cirrhosis Paternal Grandmother   . Cancer Paternal Grandfather        Pancreatic  . Fibromyalgia Sister   . Deafness Sister     Social History Social History   Tobacco Use  . Smoking status: Current Every  Day Smoker    Packs/day: 0.50    Years: 25.00    Pack years: 12.50    Types: Cigarettes  . Smokeless tobacco: Never Used  . Tobacco comment: will start Chantix soon.  Substance Use Topics  . Alcohol use: No  . Drug use: No      Review of Systems Constitutional: No fever/chills Eyes: No visual changes. ENT: No sore throat. Cardiovascular: Denies chest pain. Respiratory: Denies shortness of breath. Gastrointestinal: No abdominal pain.  No nausea, no vomiting.  No diarrhea.  No constipation. Genitourinary: Negative for dysuria. Musculoskeletal: Negative for back pain. Skin: Negative for rash. Neurological: Negative for headaches, focal weakness or numbness. Psych: Positive auditory hallucinations, SI All other ROS negative ____________________________________________   PHYSICAL EXAM:  VITAL SIGNS: ED Triage Vitals [03/28/20 1207]  Enc Vitals Group     BP (!) 179/112     Pulse Rate (!) 113     Resp 18     Temp 98.9 F (37.2 C)     Temp Source Oral     SpO2 94 %     Weight 200 lb (90.7  kg)     Height 6' (1.829 m)     Head Circumference      Peak Flow      Pain Score 0     Pain Loc      Pain Edu?      Excl. in [place]?     Constitutional: Alert and oriented. Well appearing and in no acute distress. Eyes: Conjunctivae are normal. EOMI. Head: Atraumatic. Nose: No congestion/rhinnorhea. Mouth/Throat: Mucous membranes are moist.   Neck: No stridor. Trachea Midline. FROM Cardiovascular: Tachycardic, regular rhythm. Grossly normal heart sounds.  Good peripheral circulation. Respiratory: Normal respiratory effort.  No retractions. Lungs CTAB. Gastrointestinal: Soft and nontender. No distention. No abdominal bruits.  Musculoskeletal: No lower extremity tenderness nor edema.  No joint effusions. Neurologic:  Normal speech and language. No gross focal neurologic deficits are appreciated.  Skin:  Skin is warm, dry and intact. No rash noted. Psychiatric: Mood and affect are normal. Speech and behavior are normal. GU: Deferred   ____________________________________________   LABS (all labs ordered are listed, but only abnormal results are displayed)  Labs Reviewed  COMPREHENSIVE METABOLIC PANEL - Abnormal; Notable for the following components:      Result Value   Glucose, Bld 104 (*)    All other components within normal limits  SALICYLATE LEVEL - Abnormal; Notable for the following components:   Salicylate Lvl [id number] (*)    All other components within normal limits  ACETAMINOPHEN LEVEL - Abnormal; Notable for the following components:   Acetaminophen (Tylenol), Serum <10 (*)    All other components within normal limits  CBC - Abnormal; Notable for the following components:   MCHC 36.5 (*)    All other components within normal limits  RESPIRATORY PANEL BY RT PCR (FLU A&B, COVID)  ETHANOL  URINE DRUG SCREEN, QUALITATIVE ([facility] ONLY)   ____________________________________________  INITIAL IMPRESSION / ASSESSMENT AND PLAN / ED COURSE  Johnny Ary. was evaluated  in Emergency Department on 03/28/2020 for the symptoms described in the history of present illness. He was evaluated in the context of the global COVID-19 pandemic, which necessitated consideration that the patient might be at risk for infection with the SARS-CoV-2 virus that causes COVID-19. Institutional protocols and algorithms that pertain to the evaluation of patients at risk for COVID-19 are in a state of rapid change based on information released  by regulatory bodies including the CDC and federal and state organizations. These policies and algorithms were followed during the patient's care in the ED.     Pt is without any acute medical complaints. No exam findings to suggest medical cause of current presentation. Will order psychiatric screening labs and discuss further w/ psychiatric service.  D/d includes but is not limited to psychiatric disease, behavioral/personality disorder, inadequate socioeconomic support, medical.  Based on HPI, exam, unremarkable labs, no concern for acute medical problem at this time. No rigidity, clonus, hyperthermia, focal neurologic deficit, diaphoresis, tachycardia, meningismus, ataxia, gait abnormality or other finding to suggest this visit represents a non-psychiatric problem. Screening labs reviewed.    Given this, pt medically cleared, to be dispositioned per Psych.  Patient is willing to stay voluntary.  The patient has been placed in psychiatric observation due to the need to provide a safe environment for the patient while obtaining psychiatric consultation and evaluation, as well as ongoing medical and medication management to treat the patient's condition.  The patient has not been placed under full IVC at this time.       ____________________________________________   FINAL CLINICAL IMPRESSION(S) / ED DIAGNOSES   Final diagnoses:  Suicidal ideation      MEDICATIONS GIVEN DURING THIS VISIT:  Medications - No data to display   ED  Discharge Orders    None       Note:  This document was prepared using Dragon voice recognition software and may include unintentional dictation errors.   Vanessa Sublette, MD 03/28/20 1311

## 2020-03-28 NOTE — BH Assessment (Signed)
Assessment Note  Johnny [name]. is an 49 y.o. male who presented to [facility] ED voluntarily for treatment. Pt presented oriented x 3. Pt reports to be upset with [facility] staff for watching and laughing about video located online. Pt was unable to provide details regarding video. When asked reason for visit, pt stated "It don't matter why I am here, I rather be dead anyway". Pt stated "I thought about driving my van into tree at 147mph". Pt identified friends changing on him and his baby mama bothering him as his triggers for relapse and current SI. Pt reported auditory and visual hallucinations that started yesterday morning after using cocaine starting at 9-10am yesterday morning and stopping early this morning. Pt reported endorsing symptoms off and on for 6 months and symptoms increasing since recent drug use. Pt reported thoughts of hearing people trying to break into his room and talking at his window. Pt confirmed checking his bedroom door/window and seeing no one. Pt confirmed living alone in a boarding home and the voices to be telling him to kill himself. When asked about sleep and eating habits, pt stated "I am lucky if I sleep at all and I can't remember the last time I ate". Pt denied ay changes in his weight and continued to express SI.   Pt denied any past inpatient treatment or to be currently taking medications. Pt confirmed seeing a psychiatrist 3-4 years ago at [facility] for sleep struggles and denied any current active involvement. Pt was unable to confirm his safety.   Per Sharlene Motts NP, Pt is recommended for overnight observations to be reassessed in the morning.   Diagnosis: unspecified psychosis   Past Medical History:  Past Medical History:  Diagnosis Date  . Arthritis    hands, knees, lower back  . Bilateral sciatica   . Carpal tunnel syndrome   . Chronic back pain   . Chronic neck pain   . Chronic pain 03/12/2020   has been connected to pain management clinics. currently  waiting to see psychiatrist before he can join [facility]  . Chronic pain syndrome   . GERD (gastroesophageal reflux disease)   . Hand joint pain 07/02/2013  . Herniated disc   . Lives in homeless shelter 02/2020   housing ends as of 03/25/20. going to hotel after surgery  . Traumatic amputation of left index finger 2013  . Wears dentures    full upper and lower    Past Surgical History:  Procedure Laterality Date  . AMPUTATION FINGER / THUMB Left 2013  . COLONOSCOPY WITH PROPOFOL N/A 01/03/2018   Procedure: COLONOSCOPY WITH PROPOFOL;  Surgeon: [name], MD;  Location: [place];  Service: Endoscopy;  Laterality: N/A;  . ESOPHAGOGASTRODUODENOSCOPY (EGD) WITH PROPOFOL N/A 01/03/2018   Procedure: ESOPHAGOGASTRODUODENOSCOPY (EGD) WITH PROPOFOL;  Surgeon: [name], MD;  Location: [place];  Service: Endoscopy;  Laterality: N/A;  . HERNIA REPAIR Right    right inguinal hernia repaired twice  . HIP SURGERY  2006   took bone out of hip to put into neck  . JOINT REPLACEMENT Right    knee  . KNEE SURGERY Right   . LUMBAR LAMINECTOMY/DECOMPRESSION MICRODISCECTOMY N/A 11/18/2019   Procedure: LUMBAR FOUR-FIVE LUMBAR LAMINECTOMY/DECOMPRESSION MICRODISCECTOMY;  Surgeon: [name], MD;  Location: [place];  Service: Neurosurgery;  Laterality: N/A;  . LUMBAR WOUND DEBRIDEMENT N/A 12/06/2019   Procedure: LUMBAR WOUND REVISION;  Surgeon: [name], MD;  Location: [place];  Service: Neurosurgery;  Laterality: N/A;  . neck fusion    . POLYPECTOMY N/A 01/03/2018   Procedure: POLYPECTOMY;  Surgeon: [name], MD;  Location: [place];  Service: Endoscopy;  Laterality: N/A;  . REPAIR OF CEREBROSPINAL FLUID LEAK N/A 11/28/2019   Procedure: REPAIR OF CEREBROSPINAL FLUID LEAK/LUMBAR;  Surgeon: [name], MD;  Location: [place];  Service: Neurosurgery;  Laterality: N/A;  REPAIR OF CEREBROSPINAL FLUID LEAK/LUMBAR  . REPLACEMENT TOTAL KNEE Right   . SHOULDER  SURGERY Right 2016 X 2  . SPINAL FUSION    . TONSILLECTOMY    . UMBILICAL HERNIA REPAIR N/A 11/07/2019   Procedure: HERNIA REPAIR UMBILICAL ADULT;  Surgeon: [name], MD;  Location: [facility];  Service: General;  Laterality: N/A;  . WOUND EXPLORATION N/A 12/15/2019   Procedure: LUMBAR WOUND EXPLORATION;  Surgeon: [name], MD;  Location: [place];  Service: Neurosurgery;  Laterality: N/A;  LUMBAR WOUND EXPLORATION    Family History:  Family History  Problem Relation Age of Onset  . Cancer Father        sarcoma  . Cirrhosis Paternal Grandmother   . Cancer Paternal Grandfather        Pancreatic  . Fibromyalgia Sister   . Deafness Sister     Social History:  reports that he has been smoking cigarettes. He has a 12.50 pack-year smoking history. He has never used smokeless tobacco. He reports that he does not drink alcohol or use drugs.  Additional Social History:  Alcohol / Drug Use Pain Medications: see mar Prescriptions: see mar Over the Counter: see mar History of alcohol / drug use?: Yes Substance #1 Name of Substance 1: Cocaine 1 - Last Use / Amount: 1st relapse after 8-10 years clean  CIWA: CIWA-Ar BP: (!) 179/112 Pulse Rate: (!) 113 COWS:    Allergies:  Allergies  Allergen Reactions  . Crab [Shellfish Allergy] Itching and Swelling  . Flexeril [Cyclobenzaprine] Anaphylaxis  . Codeine Itching  . Latex Swelling    Gloves(when worn), condoms  . Robaxin [Methocarbamol] Itching  . Sulfamethoxazole-Trimethoprim Nausea Only and Other (See Comments)    Stomach pain   . Tramadol Other (See Comments)    Gives headaches    Home Medications: (Not in a hospital admission)   OB/GYN Status:  No LMP for male patient.  General Assessment Data Location of Assessment: [facility] ED TTS Assessment: In system Is this a Tele or Face-to-Face Assessment?: Face-to-Face Is this an Initial Assessment or a Re-assessment for this encounter?: Initial Assessment Patient  Accompanied by:: N/A Language Other than English: No Living Arrangements: Other (Comment)(Boarding home ) What gender do you identify as?: Male Marital status: Single Pregnancy Status: No Living Arrangements: Other (Comment)(boarding home ) Can pt return to current living arrangement?: Yes Admission Status: Voluntary Is patient capable of signing voluntary admission?: Yes Referral Source: Self/Family/Friend Insurance type: Medicaid   Medical Screening Exam ([place]) Medical Exam completed: Yes  Crisis Care Plan Living Arrangements: Other (Comment)(boarding home )  Education Status Is patient currently in school?: No Is the patient employed, unemployed or receiving disability?: (Unsure )  Risk to self with the past 6 months Suicidal Ideation: Yes-Currently Present Has patient been a risk to self within the past 6 months prior to admission? : No Suicidal Intent: Yes-Currently Present Has patient had any suicidal intent within the past 6 months prior to admission? : No Is patient at risk for suicide?: Yes Suicidal Plan?: Yes-Currently Present(driving car into traffic 139mph ) Has patient had any  suicidal plan within the past 6 months prior to admission? : Yes Specify Current Suicidal Plan: driving car into traffic 125mph  Access to Means: (unsure ) What has been your use of drugs/alcohol within the last 12 months?: Cocaine  Previous Attempts/Gestures: No Triggers for Past Attempts: Other (Comment)(baby mama bothering him) Intentional Self Injurious Behavior: None Family Suicide History: Unknown Recent stressful life event(s): Other (Comment)(Baby mama bothering home ) Persecutory voices/beliefs?: Yes Depression: Yes Depression Symptoms: Insomnia Substance abuse history and/or treatment for substance abuse?: Yes Suicide prevention information given to non-admitted patients: Yes  Risk to Others within the past 6 months Homicidal Ideation: No Does patient have any  lifetime risk of violence toward others beyond the six months prior to admission? : Unknown Thoughts of Harm to Others: No Current Homicidal Intent: No Current Homicidal Plan: No Access to Homicidal Means: No History of harm to others?: No Assessment of Violence: None Noted Does patient have access to weapons?: (unsure ) Criminal Charges Pending?: Yes Does patient have a court date: Yes Is patient on probation?: Unknown  Psychosis Hallucinations: Auditory, Visual Delusions: None noted  Mental Status Report Appearance/Hygiene: In scrubs Eye Contact: Fair Motor Activity: Unremarkable Speech: Logical/coherent Level of Consciousness: Alert Mood: Depressed, Anxious, Sad Affect: Anxious, Depressed, Sad, Flat Anxiety Level: Moderate Thought Processes: Coherent, Circumstantial Judgement: Partial Orientation: Person, Situation, Appropriate for developmental age Obsessive Compulsive Thoughts/Behaviors: None  Cognitive Functioning Concentration: Good Memory: Recent Intact, Remote Intact Is patient IDD: No Insight: Good Impulse Control: Good Appetite: Poor Have you had any weight changes? : No Change Sleep: Decreased Vegetative Symptoms: None  ADLScreening [facility] Assessment Services) Patient able to express need for assistance with ADLs?: No Independently performs ADLs?: Yes (appropriate for developmental age)  Prior Inpatient Therapy Prior Inpatient Therapy: No  Prior Outpatient Therapy Prior Outpatient Therapy: No Does patient have an ACCT team?: No Does patient have Intensive In-House Services?  : No Does patient have [facility] services? : No Does patient have P4CC services?: No  ADL Screening (condition at time of admission) Is the patient deaf or have difficulty hearing?: No Does the patient have difficulty seeing, even when wearing glasses/contacts?: No Does the patient have difficulty concentrating, remembering, or making decisions?: No Patient able to express need  for assistance with ADLs?: No Does the patient have difficulty dressing or bathing?: No Independently performs ADLs?: Yes (appropriate for developmental age) Does the patient have difficulty walking or climbing stairs?: No Weakness of Legs: None Weakness of Arms/Hands: None       Abuse/Neglect Assessment (Assessment to be complete while patient is alone) Abuse/Neglect Assessment Can Be Completed: Unable to assess, patient is non-responsive or altered mental status(None reported) Values / Beliefs Cultural Requests During Hospitalization: None Spiritual Requests During Hospitalization: None Consults Spiritual Care Consult Needed: No Transition of Care Team Consult Needed: No Advance Directives (For Healthcare) Does Patient Have a Medical Advance Directive?: No Would patient like information on creating a medical advance directive?: No - Patient declined          Disposition: Per Roselyn Reef L. NP, Pt is recommended for overnight observations to be reassessed in the morning.   Disposition Initial Assessment Completed for this Encounter: Yes  On Site Evaluation by:   Reviewed with Physician:    Shanon Ace 03/28/2020 6:07 PM

## 2020-03-28 NOTE — ED Notes (Signed)
[facility] consult in progress.

## 2020-03-28 NOTE — ED Notes (Signed)
Patient tells the writer he feels like killing himself, was not specific with his plan. Patient was repetitive with stating he was up all night smoking crack and sniffing meth. Reports he did not smoke crake for about 10 years and was hanging out with a friend that was smoking crack and started smoking to. Patient feels like he does not wnt to live. Patient soft spoken and cooperative. Awaiting psych eval.

## 2020-03-28 NOTE — ED Notes (Signed)
Patient sitting on edge of bed quite and calm. provided urine tox, specimen sent to lab.

## 2020-03-29 MED ORDER — ACETAMINOPHEN 500 MG PO TABS
1000.0000 mg | ORAL_TABLET | Freq: Four times a day (QID) | ORAL | Status: DC | PRN
  Administered 2020-03-29 – 2020-03-30 (×2): 1000 mg via ORAL
  Filled 2020-03-29 (×2): qty 2

## 2020-03-29 MED ORDER — LIDOCAINE 5 % EX PTCH
1.0000 | MEDICATED_PATCH | CUTANEOUS | Status: DC
  Administered 2020-03-29: 1 via TRANSDERMAL
  Filled 2020-03-29 (×2): qty 1

## 2020-03-29 NOTE — ED Notes (Signed)
Patient ate 100% of supper and beverage, no signs of distress.  

## 2020-03-29 NOTE — ED Notes (Signed)
Nurse talked with patient and He states that He receives SSI monthly and lives in a boarding house that has no heat, and that He has three children that He does not get to see, and He has been spiraling down-hill the last 6 months, started hearing voices and has felt suicidal, states that he typically does not do drugs, but will on occasion. Patient is safe at this time, will continue to monitor.

## 2020-03-29 NOTE — ED Notes (Signed)
Patient received lunch tray, patient wanted to sleep.

## 2020-03-29 NOTE — ED Provider Notes (Signed)
Emergency Medicine Observation Re-evaluation Note  Johnny [name]. is a 49 y.o. male, seen on rounds today.  Pt initially presented to the ED for complaints of Psychiatric Evaluation Currently, the patient is calm.  Physical Exam  BP (!) 159/120 (BP Location: Right Arm)   Pulse 80   Temp 98.9 F (37.2 C) (Oral)   Resp 17   Ht 6' (1.829 m)   Wt 90.7 kg   SpO2 95%   BMI 27.12 kg/m  Physical Exam  ED Course / MDM  EKG:    I have reviewed the labs performed to date as well as medications administered while in observation.  Recent changes in the last 24 hours include chest x-ray obtained which is unremarkable. Plan  Current plan is for psychiatric hospitalization. Patient is not under full IVC at this time.   Carrie Mew, MD 03/29/20 [phone]

## 2020-03-29 NOTE — Progress Notes (Signed)
Patient meets inpatient criteria per Waylan Boga, NP. Patient has been faxed out to the following facilities for review:   [place] [facility]  [place]  [facility]  CSW will continue to follow and assist with disposition planning.   Domenic Schwab, [name], LCSW-A Clinical Disposition Social Worker [facility]/TTS [phone]

## 2020-03-29 NOTE — ED Notes (Signed)
Pt. Transferred to [place] from ED to room 4 after screening for contraband. Report to include Situation, Background, Assessment and Recommendations from [place]. Pt. Oriented to unit including Q15 minute rounds as well as the security cameras for their protection. Patient is alert and oriented, warm and dry in no acute distress. Patient denies SI, HI, and AVH. Pt. Encouraged to let me know if needs arise.

## 2020-03-29 NOTE — ED Notes (Signed)
Pt asleep, breakfast tray placed on sink in rm.  

## 2020-03-29 NOTE — Progress Notes (Signed)
Pt accepted to [name], [facility]. Room to be determined.      Dr. Jonelle Sports is the accepting provider and the attending provider.    Call report to [phone].    Abigail Butts @ [name] ED notified.     Pt is Voluntary.    Pt may be transported by [place].   Pt scheduled to arrive on 03/30/20 after 7:00am.   Domenic Schwab, [name], [place] Disposition Social Worker [facility]/TTS [phone]

## 2020-03-29 NOTE — ED Notes (Signed)
Patient ate 100% of breakfast, He received pain medication that was ordered per MD for His back, he is cooperative, and remains calm. Nurse did let MD know about elevated b/p, will recheck after pain meds have had time to hopefully be effective for pain, Patient denies Hi, but states that He does have suicidal thoughts, He has contracted verbally for safety here, will continue to monitor.

## 2020-03-29 NOTE — ED Notes (Signed)
PT  VOL  GOING  TO  [place]  ON 03/30/20  PT  VOL  WILL  GO  BY  [facility]

## 2020-03-30 ENCOUNTER — Telehealth: Payer: Self-pay | Admitting: Family Medicine

## 2020-03-30 ENCOUNTER — Telehealth: Payer: Self-pay | Admitting: General Surgery

## 2020-03-30 NOTE — ED Notes (Signed)
Hourly rounding reveals patient sleeping in room. No complaints, stable, in no acute distress. Q15 minute rounds and monitoring via Security Cameras to continue. 

## 2020-03-30 NOTE — Telephone Encounter (Signed)
I have never written that. I have told him multiple times that I will only fill the max dose of his gabapentin, which is his 600mg . I don't know when he last got the 800mg  or who gave it to him, but I will not give him more than the recommended amount of 3600mg  daily

## 2020-03-30 NOTE — Telephone Encounter (Signed)
Tried to call Johnny [name], phone continues to ring, unable to leave a message, will try again.

## 2020-03-30 NOTE — Telephone Encounter (Signed)
Reviewed patients chart. There is one rx for 600 mg, take TID. Then another one listed for 800 mg, TID, which has not been sent in in a while. Dr. Wynetta Emery, do you know how much is he supposed to be taking? There is a note on the 800 mg that states he takes with the 600 mg. So is he supposed to be doing 1400 mg TID or is that over max dose?

## 2020-03-30 NOTE — Telephone Encounter (Signed)
Melissa called from [facility] called in reference to pts current medication dosage   gabapentin   Call back [phone]

## 2020-03-30 NOTE — Telephone Encounter (Signed)
Outgoing call is made again and left message for patient to call the office so that we can advise of the following below:   Surgery Date: 04/12/20 Preadmission Testing completed on  03/12/20  Covid Testing Date: 04/08/20 - patient advised to go to the [place] ([place]) between 8a-1p   Also, patient needs to call [phone], between 1-3:00pm the day before surgery, to find out what time to arrive for surgery.     Patient also needs to be informed that any future positive drug test will result in being discharged from Dr. Glenford Peers care  ~~Per Dr. [name] the patient is currently admitted for behavioral health treatment; surgery will be cancelled until further notice.

## 2020-03-30 NOTE — ED Notes (Signed)
[facility]  CALLED  FOR  TRANSPORT  TO  [facility]

## 2020-03-31 NOTE — Telephone Encounter (Signed)
Pharmacy notified of medication dosage.

## 2020-04-01 ENCOUNTER — Ambulatory Visit: Payer: Medicaid Other | Admitting: Family Medicine

## 2020-04-03 ENCOUNTER — Other Ambulatory Visit: Payer: Self-pay | Admitting: Family Medicine

## 2020-04-08 ENCOUNTER — Other Ambulatory Visit: Payer: Medicaid Other

## 2020-04-12 ENCOUNTER — Other Ambulatory Visit: Payer: Self-pay | Admitting: Family Medicine

## 2020-04-12 ENCOUNTER — Encounter: Admission: RE | Payer: Self-pay | Source: Home / Self Care

## 2020-04-12 ENCOUNTER — Telehealth: Payer: Self-pay | Admitting: Family Medicine

## 2020-04-12 ENCOUNTER — Encounter: Payer: Self-pay | Admitting: Family Medicine

## 2020-04-12 ENCOUNTER — Telehealth: Payer: Self-pay | Admitting: General Surgery

## 2020-04-12 ENCOUNTER — Other Ambulatory Visit: Payer: Self-pay

## 2020-04-12 ENCOUNTER — Ambulatory Visit (INDEPENDENT_AMBULATORY_CARE_PROVIDER_SITE_OTHER): Payer: Medicaid Other | Admitting: Family Medicine

## 2020-04-12 ENCOUNTER — Ambulatory Visit: Admission: RE | Admit: 2020-04-12 | Payer: Medicaid Other | Source: Home / Self Care | Admitting: General Surgery

## 2020-04-12 VITALS — BP 153/93 | HR 111 | Temp 99.1°F | Wt 204.6 lb

## 2020-04-12 DIAGNOSIS — M5416 Radiculopathy, lumbar region: Secondary | ICD-10-CM

## 2020-04-12 DIAGNOSIS — Z5900 Homelessness unspecified: Secondary | ICD-10-CM

## 2020-04-12 DIAGNOSIS — G894 Chronic pain syndrome: Secondary | ICD-10-CM

## 2020-04-12 DIAGNOSIS — K429 Umbilical hernia without obstruction or gangrene: Secondary | ICD-10-CM | POA: Diagnosis not present

## 2020-04-12 DIAGNOSIS — Z96651 Presence of right artificial knee joint: Secondary | ICD-10-CM

## 2020-04-12 SURGERY — REPAIR, HERNIA, UMBILICAL, ADULT
Anesthesia: General

## 2020-04-12 MED ORDER — KETOROLAC TROMETHAMINE 60 MG/2ML IM SOLN
60.0000 mg | Freq: Once | INTRAMUSCULAR | Status: AC
  Administered 2020-04-12: 60 mg via INTRAMUSCULAR

## 2020-04-12 MED ORDER — TIZANIDINE HCL 4 MG PO CAPS: 8.0000 mg | ORAL_CAPSULE | Freq: Three times a day (TID) | ORAL | 1 refills | Status: DC | PRN

## 2020-04-12 MED ORDER — GABAPENTIN 600 MG PO TABS: 1200.0000 mg | ORAL_TABLET | Freq: Three times a day (TID) | ORAL | 6 refills | Status: DC

## 2020-04-12 MED ORDER — KETOROLAC TROMETHAMINE 10 MG PO TABS: 10.0000 mg | ORAL_TABLET | Freq: Four times a day (QID) | ORAL | 0 refills | Status: DC | PRN

## 2020-04-12 NOTE — Assessment & Plan Note (Signed)
See discussion under chronic pain. 

## 2020-04-12 NOTE — Assessment & Plan Note (Signed)
Discussed that unfortunately with him breaking his pain contract, we cannot prescribe him controlled substances. Referral to ortho for knee pain placed. Scheduled to see psychology for [facility] on 04/28/20. Continue max dose 3600mg  gabapentin daily. Refill of tizanidine given today. Toradol shot given today. Rx for toradol given as well. Follow up with pain management as scheduled. Call with any concerns.

## 2020-04-12 NOTE — Telephone Encounter (Signed)
Requested medication (s) are due for refill today: no  Requested medication (s) are on the active medication list: yes  Last refill:  03/22/2020  Future visit scheduled: no  Notes to clinic:  verify dose and if refill is needed for this medication  Last filled by historical provider    Requested Prescriptions  Pending Prescriptions Disp Refills   gabapentin (NEURONTIN) 800 MG tablet [Pharmacy Med Name: GABAPENTIN 800 MG TABLET] 270 tablet     Sig: TAKE 1 TABLET BY [place]      Neurology: Anticonvulsants - gabapentin Passed - 04/12/2020  1:05 PM      Passed - Valid encounter within last 12 months    Recent Outpatient Visits           1 month ago Tobacco abuse   [place], [place], DO   1 month ago [place], [place], [place]   4 months ago Appointment canceled by hospital   [facility], [place] P, DO   5 months ago Chronic pain syndrome   [place], DO   5 months ago Cough   [place], [place], DO

## 2020-04-12 NOTE — Telephone Encounter (Signed)
Called pharmacy. Patient is requesting to have 800 mg gabapentin refilled. Just picked up 600 mg 03/27/20 for 180 tablets, 30 day supply per the pharmacy and still has refills left on this. Isn't patient on max dose?

## 2020-04-12 NOTE — Telephone Encounter (Signed)
Attempted to contact the patient, no answer. Message left for the patient to call back.

## 2020-04-12 NOTE — Assessment & Plan Note (Signed)
Unfortunately, has exhausted the resources of our Education officer, museum and Tourist information centre manager. Continue to monitor. Call with any concerns.

## 2020-04-12 NOTE — Telephone Encounter (Signed)
Patient came in for an appointment and was notified of this message.

## 2020-04-12 NOTE — Assessment & Plan Note (Signed)
To have surgery with Dr. [name] shortly. Call with any concerns.
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
## 2020-04-12 NOTE — Telephone Encounter (Signed)
Please tell the pharmacy that I will NOT refill this and to please stop asking. I will give him 3600mg  daily- which he gets with his 600mg . I have not and will not write the 800mg . He has been asking for it for months. I've never written it and I'm not comfortable giving him more than the recommended max dose.

## 2020-04-12 NOTE — Assessment & Plan Note (Signed)
Now with worsening pain. Needs to see ortho again. Referral generated today. Call with any concerns.

## 2020-04-12 NOTE — Telephone Encounter (Signed)
Patient notified that he does not have to have someone with him for surgery, but does have to have someone responsible to pick him up after surgery from the hospital. He says that he will be able to have someone pick him up after surgery. He would like to go ahead and schedule. I let him know I would notify our surgery scheduler and she would be in contact with him.

## 2020-04-12 NOTE — Telephone Encounter (Signed)
Patient called to see if his request could be expedited

## 2020-04-12 NOTE — Telephone Encounter (Signed)
Received call from the patient.  He states is now out of the hospital.  He wants to pursue with surgery, but he also states "I have no one to accompany me to the hospital, I have no friends".  He doesn't understand why he has to have some there.  This is explained to him.  Please advise.  Thank you.

## 2020-04-13 ENCOUNTER — Telehealth: Payer: Self-pay | Admitting: General Surgery

## 2020-04-13 NOTE — Telephone Encounter (Signed)
Updated surgery information, patient has been advised of Pre-Admission date/time, COVID Testing date and Surgery date.  Surgery Date: 04/23/20 Preadmission Testing Date: 03/12/20 (completed) Covid Testing Date: 04/21/20 - patient advised to go to the [place] ([place]) between 8a-1p   Patient has been made aware to call [phone], between 1-3:00pm the day before surgery, to find out what time to arrive for surgery.

## 2020-04-13 NOTE — Telephone Encounter (Signed)
errounous error

## 2020-04-21 ENCOUNTER — Other Ambulatory Visit: Payer: Self-pay

## 2020-04-21 ENCOUNTER — Other Ambulatory Visit
Admission: RE | Admit: 2020-04-21 | Discharge: 2020-04-21 | Disposition: A | Discharge status: Home / Self Care | Payer: Medicaid Other | Source: Ambulatory Visit | Attending: General Surgery | Admitting: General Surgery

## 2020-04-21 DIAGNOSIS — Z20822 Contact with and (suspected) exposure to covid-19: Secondary | ICD-10-CM | POA: Insufficient documentation

## 2020-04-21 DIAGNOSIS — Z01812 Encounter for preprocedural laboratory examination: Secondary | ICD-10-CM | POA: Diagnosis present

## 2020-04-21 LAB — SARS CORONAVIRUS 2 (TAT 6-24 HRS): SARS Coronavirus 2: NEGATIVE

## 2020-04-23 ENCOUNTER — Ambulatory Visit
Admission: RE | Admit: 2020-04-23 | Discharge: 2020-04-23 | Disposition: A | Discharge status: Home / Self Care | Payer: Medicaid Other | Attending: General Surgery | Admitting: General Surgery

## 2020-04-23 ENCOUNTER — Encounter: Payer: Self-pay | Admitting: General Surgery

## 2020-04-23 ENCOUNTER — Ambulatory Visit: Payer: Medicaid Other | Admitting: Anesthesiology

## 2020-04-23 ENCOUNTER — Other Ambulatory Visit: Payer: Self-pay

## 2020-04-23 ENCOUNTER — Encounter
Admission: RE | Disposition: A | Discharge status: Home / Self Care | Payer: Self-pay | Source: Home / Self Care | Attending: General Surgery

## 2020-04-23 DIAGNOSIS — K219 Gastro-esophageal reflux disease without esophagitis: Secondary | ICD-10-CM | POA: Insufficient documentation

## 2020-04-23 DIAGNOSIS — Z79899 Other long term (current) drug therapy: Secondary | ICD-10-CM | POA: Insufficient documentation

## 2020-04-23 DIAGNOSIS — K4090 Unilateral inguinal hernia, without obstruction or gangrene, not specified as recurrent: Secondary | ICD-10-CM | POA: Diagnosis not present

## 2020-04-23 DIAGNOSIS — K429 Umbilical hernia without obstruction or gangrene: Secondary | ICD-10-CM | POA: Insufficient documentation

## 2020-04-23 DIAGNOSIS — G894 Chronic pain syndrome: Secondary | ICD-10-CM | POA: Insufficient documentation

## 2020-04-23 DIAGNOSIS — K432 Incisional hernia without obstruction or gangrene: Secondary | ICD-10-CM

## 2020-04-23 DIAGNOSIS — F17210 Nicotine dependence, cigarettes, uncomplicated: Secondary | ICD-10-CM | POA: Insufficient documentation

## 2020-04-23 DIAGNOSIS — Z96651 Presence of right artificial knee joint: Secondary | ICD-10-CM | POA: Insufficient documentation

## 2020-04-23 DIAGNOSIS — M1990 Unspecified osteoarthritis, unspecified site: Secondary | ICD-10-CM | POA: Insufficient documentation

## 2020-04-23 HISTORY — PX: INGUINAL HERNIA REPAIR: SHX194

## 2020-04-23 HISTORY — PX: UMBILICAL HERNIA REPAIR: SHX196

## 2020-04-23 LAB — URINE DRUG SCREEN, QUALITATIVE (ARMC ONLY)
Amphetamines, Ur Screen: NOT DETECTED
Barbiturates, Ur Screen: NOT DETECTED
Benzodiazepine, Ur Scrn: NOT DETECTED
Cannabinoid 50 Ng, Ur ~~LOC~~: NOT DETECTED
Cocaine Metabolite,Ur ~~LOC~~: NOT DETECTED
MDMA (Ecstasy)Ur Screen: NOT DETECTED
Methadone Scn, Ur: NOT DETECTED
Opiate, Ur Screen: NOT DETECTED
Phencyclidine (PCP) Ur S: NOT DETECTED
Tricyclic, Ur Screen: NOT DETECTED

## 2020-04-23 SURGERY — REPAIR, HERNIA, UMBILICAL, ADULT
Anesthesia: General

## 2020-04-23 MED ORDER — CELECOXIB 200 MG PO CAPS
ORAL_CAPSULE | ORAL | Status: AC
  Filled 2020-04-23: qty 1

## 2020-04-23 MED ORDER — SEVOFLURANE IN SOLN
RESPIRATORY_TRACT | Status: AC
  Filled 2020-04-23: qty 250

## 2020-04-23 MED ORDER — OXYCODONE HCL 5 MG PO TABS
ORAL_TABLET | ORAL | Status: AC
  Filled 2020-04-23: qty 1

## 2020-04-23 MED ORDER — MIDAZOLAM HCL 2 MG/2ML IJ SOLN
INTRAMUSCULAR | Status: DC | PRN
  Administered 2020-04-23: 2 mg via INTRAVENOUS

## 2020-04-23 MED ORDER — EPHEDRINE 5 MG/ML INJ
INTRAVENOUS | Status: AC
  Filled 2020-04-23: qty 10

## 2020-04-23 MED ORDER — FENTANYL CITRATE (PF) 100 MCG/2ML IJ SOLN
INTRAMUSCULAR | Status: AC
  Filled 2020-04-23: qty 2

## 2020-04-23 MED ORDER — DEXMEDETOMIDINE HCL 200 MCG/2ML IV SOLN
INTRAVENOUS | Status: DC | PRN
  Administered 2020-04-23: 16 ug via INTRAVENOUS
  Administered 2020-04-23 (×2): 8 ug via INTRAVENOUS
  Administered 2020-04-23 (×2): 12 ug via INTRAVENOUS

## 2020-04-23 MED ORDER — ONDANSETRON HCL 4 MG/2ML IJ SOLN
INTRAMUSCULAR | Status: DC | PRN
  Administered 2020-04-23: 4 mg via INTRAVENOUS

## 2020-04-23 MED ORDER — PHENYLEPHRINE HCL (PRESSORS) 10 MG/ML IV SOLN
INTRAVENOUS | Status: DC | PRN
  Administered 2020-04-23: 100 ug via INTRAVENOUS
  Administered 2020-04-23: 200 ug via INTRAVENOUS
  Administered 2020-04-23 (×3): 100 ug via INTRAVENOUS

## 2020-04-23 MED ORDER — ONDANSETRON HCL 4 MG/2ML IJ SOLN: 4.0000 mg | Freq: Once | INTRAMUSCULAR | Status: DC | PRN

## 2020-04-23 MED ORDER — LIDOCAINE-EPINEPHRINE 1 %-1:100000 IJ SOLN
INTRAMUSCULAR | Status: DC | PRN
  Administered 2020-04-23: 9 mL via INTRAMUSCULAR
  Administered 2020-04-23: 10 mL via INTRAMUSCULAR

## 2020-04-23 MED ORDER — FENTANYL CITRATE (PF) 100 MCG/2ML IJ SOLN
25.0000 ug | INTRAMUSCULAR | Status: DC | PRN
  Administered 2020-04-23 (×3): 25 ug via INTRAVENOUS

## 2020-04-23 MED ORDER — FAMOTIDINE 20 MG PO TABS
ORAL_TABLET | ORAL | Status: AC
  Filled 2020-04-23: qty 1

## 2020-04-23 MED ORDER — LACTATED RINGERS IV SOLN: INTRAVENOUS | Status: DC

## 2020-04-23 MED ORDER — ACETAMINOPHEN 500 MG PO TABS
1000.0000 mg | ORAL_TABLET | Freq: Once | ORAL | Status: AC
  Administered 2020-04-23: 10:00:00 1000 mg via ORAL

## 2020-04-23 MED ORDER — LIDOCAINE-EPINEPHRINE 1 %-1:100000 IJ SOLN
INTRAMUSCULAR | Status: AC
  Filled 2020-04-23: qty 1

## 2020-04-23 MED ORDER — DEXMEDETOMIDINE HCL IN NACL 80 MCG/20ML IV SOLN
INTRAVENOUS | Status: AC
  Filled 2020-04-23: qty 20

## 2020-04-23 MED ORDER — CELECOXIB 200 MG PO CAPS
200.0000 mg | ORAL_CAPSULE | Freq: Once | ORAL | Status: AC
  Administered 2020-04-23: 10:00:00 200 mg via ORAL

## 2020-04-23 MED ORDER — FENTANYL CITRATE (PF) 100 MCG/2ML IJ SOLN
INTRAMUSCULAR | Status: AC
  Administered 2020-04-23: 17:00:00 25 ug via INTRAVENOUS
  Filled 2020-04-23: qty 2

## 2020-04-23 MED ORDER — ONDANSETRON HCL 4 MG/2ML IJ SOLN
INTRAMUSCULAR | Status: AC
  Filled 2020-04-23: qty 2

## 2020-04-23 MED ORDER — SUCCINYLCHOLINE CHLORIDE 20 MG/ML IJ SOLN
INTRAMUSCULAR | Status: DC | PRN
  Administered 2020-04-23: 100 mg via INTRAVENOUS

## 2020-04-23 MED ORDER — BUPIVACAINE HCL (PF) 0.25 % IJ SOLN
INTRAMUSCULAR | Status: AC
  Filled 2020-04-23: qty 30

## 2020-04-23 MED ORDER — OXYCODONE HCL 5 MG PO TABS
5.0000 mg | ORAL_TABLET | Freq: Four times a day (QID) | ORAL | 0 refills | Status: DC | PRN
Start: 2020-04-23 — End: 2020-05-04

## 2020-04-23 MED ORDER — OXYCODONE HCL 5 MG PO TABS
5.0000 mg | ORAL_TABLET | Freq: Once | ORAL | Status: AC
  Administered 2020-04-23: 17:00:00 5 mg via ORAL

## 2020-04-23 MED ORDER — MIDAZOLAM HCL 2 MG/2ML IJ SOLN
INTRAMUSCULAR | Status: AC
  Filled 2020-04-23: qty 2

## 2020-04-23 MED ORDER — ACETAMINOPHEN 500 MG PO TABS
1000.0000 mg | ORAL_TABLET | Freq: Three times a day (TID) | ORAL | 0 refills | Status: DC
Start: 2020-04-23 — End: 2020-05-04

## 2020-04-23 MED ORDER — FAMOTIDINE 20 MG PO TABS
20.0000 mg | ORAL_TABLET | Freq: Once | ORAL | Status: AC
  Administered 2020-04-23: 10:00:00 20 mg via ORAL

## 2020-04-23 MED ORDER — DEXAMETHASONE SODIUM PHOSPHATE 10 MG/ML IJ SOLN
INTRAMUSCULAR | Status: AC
  Filled 2020-04-23: qty 1

## 2020-04-23 MED ORDER — ROCURONIUM BROMIDE 10 MG/ML (PF) SYRINGE
PREFILLED_SYRINGE | INTRAVENOUS | Status: AC
  Filled 2020-04-23: qty 10

## 2020-04-23 MED ORDER — BUPIVACAINE LIPOSOME 1.3 % IJ SUSP
INTRAMUSCULAR | Status: DC | PRN
  Administered 2020-04-23 (×2): 10 mL

## 2020-04-23 MED ORDER — GLYCOPYRROLATE 0.2 MG/ML IJ SOLN
INTRAMUSCULAR | Status: AC
  Filled 2020-04-23: qty 1

## 2020-04-23 MED ORDER — CEFAZOLIN SODIUM-DEXTROSE 2-4 GM/100ML-% IV SOLN
INTRAVENOUS | Status: AC
  Filled 2020-04-23: qty 100

## 2020-04-23 MED ORDER — BUPIVACAINE HCL 0.25 % IJ SOLN
INTRAMUSCULAR | Status: DC | PRN
  Administered 2020-04-23 (×2): 10 mL

## 2020-04-23 MED ORDER — EPHEDRINE SULFATE 50 MG/ML IJ SOLN
INTRAMUSCULAR | Status: DC | PRN
  Administered 2020-04-23: 10 mg via INTRAVENOUS

## 2020-04-23 MED ORDER — PROPOFOL 10 MG/ML IV BOLUS
INTRAVENOUS | Status: DC | PRN
  Administered 2020-04-23: 200 mg via INTRAVENOUS

## 2020-04-23 MED ORDER — GLYCOPYRROLATE 0.2 MG/ML IJ SOLN
INTRAMUSCULAR | Status: DC | PRN
  Administered 2020-04-23: .2 mg via INTRAVENOUS

## 2020-04-23 MED ORDER — FENTANYL CITRATE (PF) 100 MCG/2ML IJ SOLN
INTRAMUSCULAR | Status: DC | PRN
  Administered 2020-04-23 (×2): 25 ug via INTRAVENOUS
  Administered 2020-04-23: 50 ug via INTRAVENOUS
  Administered 2020-04-23 (×2): 25 ug via INTRAVENOUS
  Administered 2020-04-23: 50 ug via INTRAVENOUS

## 2020-04-23 MED ORDER — ACETAMINOPHEN 500 MG PO TABS
ORAL_TABLET | ORAL | Status: AC
  Filled 2020-04-23: qty 2

## 2020-04-23 MED ORDER — PHENYLEPHRINE HCL-NACL 10-0.9 MG/250ML-% IV SOLN
INTRAVENOUS | Status: DC | PRN
  Administered 2020-04-23: 50 ug/min via INTRAVENOUS

## 2020-04-23 MED ORDER — IBUPROFEN 800 MG PO TABS
800.0000 mg | ORAL_TABLET | Freq: Three times a day (TID) | ORAL | 0 refills | Status: DC | PRN
Start: 2020-04-23 — End: 2020-04-24

## 2020-04-23 MED ORDER — DEXAMETHASONE SODIUM PHOSPHATE 10 MG/ML IJ SOLN
INTRAMUSCULAR | Status: DC | PRN
  Administered 2020-04-23: 10 mg via INTRAVENOUS

## 2020-04-23 MED ORDER — KETOROLAC TROMETHAMINE 30 MG/ML IJ SOLN
INTRAMUSCULAR | Status: AC
  Filled 2020-04-23: qty 1

## 2020-04-23 MED ORDER — PHENYLEPHRINE HCL (PRESSORS) 10 MG/ML IV SOLN
INTRAVENOUS | Status: AC
  Filled 2020-04-23: qty 1

## 2020-04-23 MED ORDER — BUPIVACAINE LIPOSOME 1.3 % IJ SUSP
INTRAMUSCULAR | Status: AC
  Filled 2020-04-23: qty 20

## 2020-04-23 MED ORDER — CEFAZOLIN SODIUM-DEXTROSE 2-4 GM/100ML-% IV SOLN
2.0000 g | Freq: Once | INTRAVENOUS | Status: AC
  Administered 2020-04-23: 2 g via INTRAVENOUS

## 2020-04-23 MED ORDER — BUPIVACAINE LIPOSOME 1.3 % IJ SUSP: 10.0000 mL | Freq: Once | INTRAMUSCULAR | Status: DC

## 2020-04-23 MED ORDER — ROCURONIUM BROMIDE 100 MG/10ML IV SOLN
INTRAVENOUS | Status: DC | PRN
  Administered 2020-04-23: 15 mg via INTRAVENOUS
  Administered 2020-04-23: 20 mg via INTRAVENOUS
  Administered 2020-04-23: 5 mg via INTRAVENOUS
  Administered 2020-04-23: 10 mg via INTRAVENOUS
  Administered 2020-04-23: 20 mg via INTRAVENOUS
  Administered 2020-04-23: 35 mg via INTRAVENOUS

## 2020-04-23 MED ORDER — LIDOCAINE HCL (CARDIAC) PF 100 MG/5ML IV SOSY
PREFILLED_SYRINGE | INTRAVENOUS | Status: DC | PRN
  Administered 2020-04-23: 50 mg via INTRAVENOUS

## 2020-04-23 SURGICAL SUPPLY — 43 items
ADH SKN CLS APL DERMABOND .7 (GAUZE/BANDAGES/DRESSINGS) ×4
APL PRP STRL LF DISP 70% ISPRP (MISCELLANEOUS) ×2
BLADE CLIPPER SURG (BLADE) ×3 IMPLANT
BLADE SURG 15 STRL LF DISP TIS (BLADE) ×2 IMPLANT
BLADE SURG 15 STRL SS (BLADE) ×3
CANISTER SUCT 1200ML W/VALVE (MISCELLANEOUS) ×3 IMPLANT
CHLORAPREP W/TINT 26 (MISCELLANEOUS) ×3 IMPLANT
COVER WAND RF STERILE (DRAPES) ×3 IMPLANT
DERMABOND ADVANCED (GAUZE/BANDAGES/DRESSINGS) ×2
DERMABOND ADVANCED .7 DNX12 (GAUZE/BANDAGES/DRESSINGS) ×2 IMPLANT
DRAIN PENROSE 5/8X18 LTX STRL (DRAIN) ×3 IMPLANT
DRAPE LAPAROTOMY 77X122 PED (DRAPES) ×3 IMPLANT
ELECT CAUTERY BLADE TIP 2.5 (TIP) ×3
ELECT REM PT RETURN 9FT ADLT (ELECTROSURGICAL) ×3
ELECTRODE CAUTERY BLDE TIP 2.5 (TIP) ×2 IMPLANT
ELECTRODE REM PT RTRN 9FT ADLT (ELECTROSURGICAL) ×2 IMPLANT
GLOVE BIO SURGEON STRL SZ 6.5 (GLOVE) ×3 IMPLANT
GLOVE INDICATOR 7.0 STRL GRN (GLOVE) ×6 IMPLANT
GOWN STRL REUS W/ TWL LRG LVL3 (GOWN DISPOSABLE) ×4 IMPLANT
GOWN STRL REUS W/TWL LRG LVL3 (GOWN DISPOSABLE) ×6
KIT TURNOVER KIT A (KITS) ×3 IMPLANT
LABEL OR SOLS (LABEL) ×2 IMPLANT
MESH HERNIA SYS ULTRAPRO LRG (Mesh General) ×1 IMPLANT
MESH VENTRALIGHT ST 4X6IN (Mesh General) ×1 IMPLANT
NEEDLE HYPO 22GX1.5 SAFETY (NEEDLE) ×6 IMPLANT
NS IRRIG 500ML POUR BTL (IV SOLUTION) ×3 IMPLANT
PACK BASIN MINOR (MISCELLANEOUS) ×3 IMPLANT
SPONGE KITTNER 5P (MISCELLANEOUS) ×3 IMPLANT
SPONGE LAP 18X18 RF (DISPOSABLE) ×3 IMPLANT
STRIP CLOSURE SKIN 1/2X4 (GAUZE/BANDAGES/DRESSINGS) ×3 IMPLANT
SUT ETHIBOND NAB MO 7 #0 18IN (SUTURE) ×4 IMPLANT
SUT MNCRL 4-0 (SUTURE) ×3
SUT MNCRL 4-0 27XMFL (SUTURE) ×2
SUT PROLENE 2 0 SH DA (SUTURE) IMPLANT
SUT VIC AB 2-0 SH 27 (SUTURE)
SUT VIC AB 2-0 SH 27XBRD (SUTURE) IMPLANT
SUT VIC AB 3-0 SH 27 (SUTURE) ×3
SUT VIC AB 3-0 SH 27X BRD (SUTURE) ×2 IMPLANT
SUT VICRYL 2-0 SH 8X27 (SUTURE) IMPLANT
SUTURE MNCRL 4-0 27XMF (SUTURE) ×2 IMPLANT
SYR 10ML LL (SYRINGE) ×3 IMPLANT
SYR 20ML LL LF (SYRINGE) ×3 IMPLANT
SYR BULB IRRIG 60ML STRL (SYRINGE) ×3 IMPLANT

## 2020-04-23 NOTE — Discharge Instructions (Signed)

## 2020-04-23 NOTE — Transfer of Care (Signed)
Immediate Anesthesia Transfer of Care Note  Patient: Johnny [name].  Procedure(s) Performed: HERNIA REPAIR UMBILICAL ADULT (N/A ) HERNIA REPAIR INGUINAL ADULT (Left )  Patient Location: PACU  Anesthesia Type:General  Level of Consciousness: drowsy  Airway & Oxygen Therapy: Patient Spontanous Breathing and Patient connected to face mask oxygen  Post-op Assessment: Report given to RN and Post -op Vital signs reviewed and stable  Post vital signs: Reviewed and stable  Last Vitals:  Vitals Value Taken Time  BP 119/85 04/23/20 1550  Temp 36.6 C 04/23/20 1549  Pulse 95 04/23/20 1600  Resp 14 04/23/20 1600  SpO2 96 % 04/23/20 1600  Vitals shown include unvalidated device data.  Last Pain:  Vitals:   04/23/20 1549  PainSc: 0-No pain         Complications: No apparent anesthesia complications

## 2020-04-23 NOTE — H&P (Signed)
HPI Johnny [name]. is a 49 y.o. male.   I performed an open umbilical hernia repair on him in November 2020.  After that operation, he underwent a number of back surgeries that were rife with complications.  He says that after these surgeries, he gained a fair amount of weight.  He says that he has recently noticed a knot at his umbilicus. He says that it seems to be getting bigger.  He says it is tender.  He has also noticed a bulge just cranial to the incision from his hernia repair.  He states that he is unable to perform any heavy lifting secondary to his back surgeries.  He denies any fevers or chills.  No nausea or vomiting.  The tenderness is localized to the umbilicus.       Past Medical History:  Diagnosis Date  . Arthritis    hands, knees, lower back  . Bilateral sciatica   . Carpal tunnel syndrome   . Chronic back pain   . Chronic neck pain   . Chronic pain syndrome   . GERD (gastroesophageal reflux disease)   . Hand joint pain 07/02/2013  . Herniated disc   . Traumatic amputation of left index finger 2013  . Wears dentures    full upper and lower         Past Surgical History:  Procedure Laterality Date  . AMPUTATION FINGER / THUMB Left 2013  . COLONOSCOPY WITH PROPOFOL N/A 01/03/2018   Procedure: COLONOSCOPY WITH PROPOFOL;  Surgeon: [name], MD;  Location: [place];  Service: Endoscopy;  Laterality: N/A;  . ESOPHAGOGASTRODUODENOSCOPY (EGD) WITH PROPOFOL N/A 01/03/2018   Procedure: ESOPHAGOGASTRODUODENOSCOPY (EGD) WITH PROPOFOL;  Surgeon: [name], MD;  Location: [place];  Service: Endoscopy;  Laterality: N/A;  . HERNIA REPAIR Right    right inguinal hernia repaired twice  . HIP SURGERY    . JOINT REPLACEMENT Right    knee  . KNEE SURGERY Right   . LUMBAR LAMINECTOMY/DECOMPRESSION MICRODISCECTOMY N/A 11/18/2019   Procedure: LUMBAR FOUR-FIVE LUMBAR LAMINECTOMY/DECOMPRESSION MICRODISCECTOMY;  Surgeon:  [name], MD;  Location: [place];  Service: Neurosurgery;  Laterality: N/A;  . LUMBAR WOUND DEBRIDEMENT N/A 12/06/2019   Procedure: LUMBAR WOUND REVISION;  Surgeon: [name], MD;  Location: [place];  Service: Neurosurgery;  Laterality: N/A;  . neck fusion    . POLYPECTOMY N/A 01/03/2018   Procedure: POLYPECTOMY;  Surgeon: [name], MD;  Location: [place];  Service: Endoscopy;  Laterality: N/A;  . REPAIR OF CEREBROSPINAL FLUID LEAK N/A 11/28/2019   Procedure: REPAIR OF CEREBROSPINAL FLUID LEAK/LUMBAR;  Surgeon: [name], MD;  Location: [place];  Service: Neurosurgery;  Laterality: N/A;  REPAIR OF CEREBROSPINAL FLUID LEAK/LUMBAR  . REPLACEMENT TOTAL KNEE Right   . SHOULDER SURGERY Right 2016 X 2  . SPINAL FUSION    . TONSILLECTOMY    . UMBILICAL HERNIA REPAIR N/A 11/07/2019   Procedure: HERNIA REPAIR UMBILICAL ADULT;  Surgeon: [name], MD;  Location: [facility];  Service: General;  Laterality: N/A;  . WOUND EXPLORATION N/A 12/15/2019   Procedure: LUMBAR WOUND EXPLORATION;  Surgeon: [name], MD;  Location: [place];  Service: Neurosurgery;  Laterality: N/A;  LUMBAR WOUND EXPLORATION         Family History  Problem Relation Age of Onset  . Cancer Father        sarcoma  . Cirrhosis Paternal Grandmother   . Cancer Paternal Grandfather  Pancreatic  . Fibromyalgia Sister   . Deafness Sister     Social History Social History        Tobacco Use  . Smoking status: Current Every Day Smoker    Packs/day: 0.50    Years: 25.00    Pack years: 12.50    Types: Cigarettes  . Smokeless tobacco: Never Used  Substance Use Topics  . Alcohol use: No  . Drug use: No         Allergies  Allergen Reactions  . Crab [Shellfish Allergy] Itching and Swelling  . Flexeril [Cyclobenzaprine] Anaphylaxis  . Codeine Itching  . Latex Swelling    Gloves(when worn), condoms  . Robaxin [Methocarbamol] Itching  .  Sulfamethoxazole-Trimethoprim Nausea Only and Other (See Comments)    Stomach pain   . Tramadol Other (See Comments)          Current Outpatient Medications  Medication Sig Dispense Refill  . diphenhydramine-acetaminophen (TYLENOL PM) 25-500 MG TABS tablet Take 1 tablet by mouth at bedtime as needed (sleep).     . gabapentin (NEURONTIN) 600 MG tablet Take 2 tablets (1,200 mg total) by mouth 3 (three) times daily. (Patient taking differently: Take 600 mg by mouth 3 (three) times daily. Take with 800mg  tablet for 1400mg  total) 180 tablet 6  . gabapentin (NEURONTIN) 800 MG tablet Take 800 mg by mouth 3 (three) times daily. Take with 600 mg tablet to equal 1400 mg 3 times daily    . tiZANidine (ZANAFLEX) 4 MG tablet Take 1 tablet (4 mg total) by mouth every 6 (six) hours as needed for muscle spasms. 60 tablet 0  . Triamcinolone Acetonide (GOODSENSE NASAL ALLERGY SPRAY NA) Place 1 spray into the nose daily as needed (allergies).     No current facility-administered medications for this visit.    Review of Systems Review of Systems  Gastrointestinal: Positive for abdominal pain.  Musculoskeletal: Positive for back pain and neck pain.  All other systems reviewed and are negative.   Today's Vitals   04/23/20 0957  BP: (!) 147/104  Pulse: 91  Resp: 18  Temp: 98.3 F (36.8 C)  SpO2: 100%  Weight: 92.8 kg  Height: 6' (1.829 m)  PainSc: 8    Body mass index is 27.75 kg/m.   Physical Exam Physical Exam Constitutional:      General: He is not in acute distress.    Appearance: Normal appearance.  HENT:     Head: Normocephalic and atraumatic.     Nose:     Comments: Covered with a mask secondary to COVID-19 precautions    Mouth/Throat:     Comments: Covered with a mask secondary to COVID-19 precautions Eyes:     General: No scleral icterus.       Right eye: No discharge.        Left eye: No discharge.  Cardiovascular:     Rate and Rhythm: Normal rate and  regular rhythm.     Pulses: Normal pulses.  Pulmonary:     Effort: Pulmonary effort is normal. No respiratory distress.     Breath sounds: Normal breath sounds.  Abdominal:     Palpations: Abdomen is soft.       Comments: Scar from prior umbilical hernia repair.  I am unable to appreciate a fascial defect in this site, however the exam is limited by patient tenderness.  He has a large diastasis rectus.  Genitourinary:    Comments: Deferred Musculoskeletal:        General:  No swelling, tenderness or deformity.     Cervical back: No rigidity.  Lymphadenopathy:     Cervical: No cervical adenopathy.  Skin:    General: Skin is warm and dry.          Comments: Extensive tattoos. Scar on the lower spine from prior surgical interventions.  Neurological:     General: No focal deficit present.     Mental Status: He is alert and oriented to person, place, and time.  Psychiatric:        Mood and Affect: Mood normal.        Behavior: Behavior normal.     Data Reviewed I reviewed my prior operative report from November 07, 2019.  At that time, I identified 2 small hernia defects and repaired each of them primarily.  Assessment This is a 49 year old man who had an open umbilical hernia repair in November 2020.  He has tenderness in his umbilicus and I am suspicious that he may have a recurrence, however my exam is limited by his discomfort.  Plan We will obtain a CT scan of the abdomen to evaluate for any hernia defects.  He was reassured that the diastasis rectus is of no clinical significance at this time and does not require surgical intervention.  Once I have the CT scan results, I will contact Johnny [name] and discuss the findings and any potential surgical intervention at that time.  The scan was performed and identified a recurrence of the umbilical hernia and also found a left inguinal hernia.  I offered him repair of both defects. The risks of surgery were discussed with him in  detail.  He presented for surgery earlier this month, but was positive for cocaine and the operation was cancelled.  He has been rescheduled for today and is negative for cocaine.  We will proceed with open repair of a recurrent umbilical hernia and left inguinal hernia.

## 2020-04-23 NOTE — Op Note (Signed)
Herniorrhaphy Procedure Note  Indications: The patient has an asymptomatic left inguinal hernia.   Pre-operative Diagnosis: left inguinal hernia  Post-operative Diagnosis: left inguinal hernia  Surgeon: [name]   Assistants: Arvilla Meres, RNFA  Anesthesia: General endotracheal anesthesia  ASA Class: 2  Procedure Details  The patient was seen in the Holding Room. The risks, benefits, complications, treatment options, and expected outcomes were discussed with the patient. The possibilities of reaction to medication, pain, infection, bleeding, heart attack, death, injury to internal organs, recurrence, testicular damage, infertility, or need for further surgery were discussed with the patient. The patient concurred with the proposed plan, giving informed consent.  The site of surgery properly noted/marked. The patient was taken to Operating Room # 3, identified as Johnny [name]. and the procedure verified as left inguinal hernia repair. A Time Out was held and the above information confirmed.  The patient was placed supine.  After the patient was anesthetized, the left groin was prepped and draped in the standard fashion.  A one-to-one mixture of 0.25% bupivacaine and 1% lidocaine with epinephrine was used to anesthetize the skin over the inguinal ligament.    A low transverse incision was created in the left groin, carried down throught the subcutaneious tissues with cautery.  The external oblique fascia was identified and incised parallel to its fibers. It was opened through the external ring. The ilioinguinal nerve was dissected free, protected, and preserved throughout the procedure. Cord structures were encircled at the level of the pubic tubercle and a penrose drain passed. An direct hernia was identified.  A large Ultra Pro mesh was used for the repair.  The disc component was placed into the direct hernia defect and the mesh aspect secured to the pubic tubercle with 0  Ethibond suture.  It was tacked circumferentially to the conjoined tendon medially and the shelving edge of the inguinal ligament laterally. The spermatic cord was passed through an opening in the mesh laterally and moved freely in the mesh opening which admitted the tip of the index finger.the external oblique fascia was then closed with running 2-0 Vicryl.  Liposomal bupivacaine was infiltrated along the fascial planes.  Scarpa's fascia was closed with 3-0 Vicryl and the skin was closed with running subcuticular 4-0 Vicryl.  Dermabond and Steri-Strips were applied.  Findings: left direct hernia  Estimated Blood Loss:  Minimal         Drains: None         Total IV Fluids: See anesthesia record         Specimens: None         Implants: Large Ultra Pro mesh         Complications:  None; patient tolerated the procedure well.         Disposition: PACU - hemodynamically stable.  Plan to discharge home.         Condition: stable  Attending Attestation: I was present and scrubbed for all key portions of the procedure.  Ventral Herniorrhaphy Procedure Note (recurrent ventral hernia repair with mesh); excision of skin and soft tissue measuring 4.5 x 0.5 cm    Indications: Symptomatic ventral hernia, recurrent  Pre-operative Diagnosis: ventral hernia, recurrent  Post-operative Diagnosis: Same  Surgeon: [name]   Assistants: Arvilla Meres, RNFA  Anesthesia: General endotracheal anesthesia  ASA Class: 2  Procedure Details  The patient was seen in the Holding Room. The risks, benefits, complications, treatment options, and expected outcomes were discussed with the patient. The possibilities  of reaction to medication, pulmonary aspiration, perforation of viscus, bleeding, recurrent infection, the need for additional procedures, failure to diagnose a condition, and creating a complication requiring transfusion or operation were discussed with the patient. The patient concurred with  the proposed plan, giving informed consent.  The site of surgery properly noted/marked. The patient was taken to Operating Room # 3, identified as Johnny [name]. and the procedure verified as Ventral Herniorrhaphy. A Time Out was held and the above information confirmed.  The patient was placed supine.  After establishing general anesthesia, the abdomen was prepped and draped in standard fashion.  A one-to-one mixture of 0.25% bupivacaine and 1% lidocaine with epinephrine was used to infiltrate the skin and subcutaneous tissues around the patient's prior hernia repair scar.  The scar was then excised using combination of sharp dissection and electrocautery.  The total area of tissue removed was 2.25 cm (4.5 cm long by 0.5 cm wide)/ Dissection was carried down to the hernia sac located above the fascia and mobilized from surrounding structures.  Sutures from the prior repair were identified.  There were multiple defects identified.  Each was dissected out and hernia sac excised, along with a small amount of preperitoneal fat.  All of the defects were joined together to create a single hernia defect.  Intact fascia was identified circumferentially around the defect.  A 10 cm Ventralight ST mesh was then brought into the field.  It was trimmed slightly to allow for appropriate underlay within the defect.  It was secured to the fascia with 0 Ethibond suture.  There was no undue tension and the mesh lay flat against the underlying abdominal structures.  Care was taken to ensure that the film side abutted the bowel.  The fascia was then reapproximated over the mesh using interrupted 0 Ethibond suture.  Liposomal bupivacaine was infiltrated along the fascial planes.  The soft tissue was irrigated and closed in layers.  Hemostasis was confirmed.  The skin incision was closed in layers with a 4-0 Vicryl subcuticular closure.  Dermabond and Steri-Strips were applied at the end of the operation.    Instrument,  sponge, and needle counts were correct prior to closure and at the conclusion of the case.   Findings: Multiple hernia defects along the site of a prior umbilical hernia repair.  Estimated Blood Loss:  Minimal         Drains: None         Total IV Fluids: See anesthesia record          Specimens: None         Implants: 10 cm Ventralight ST mesh         Complications:  None; patient tolerated the procedure well.         Disposition: PACU - hemodynamically stable.  Plan for discharge home.         Condition: stable  Attending Attestation: I was present and scrubbed for the key portions of the procedure.

## 2020-04-23 NOTE — Anesthesia Preprocedure Evaluation (Signed)
Anesthesia Evaluation  Patient identified by MRN, date of birth, ID band Patient awake    Reviewed: Allergy & Precautions, NPO status , Patient's Chart, lab work & pertinent test results  History of Anesthesia Complications Negative for: history of anesthetic complications  Airway Mallampati: II       Dental   Pulmonary neg sleep apnea, neg COPD, Current Smoker,           Cardiovascular (-) hypertension(-) Past MI and (-) CHF (-) dysrhythmias (-) Valvular Problems/Murmurs     Neuro/Psych neg Seizures    GI/Hepatic Neg liver ROS, PUD, GERD  Medicated and Controlled,  Endo/Other  neg diabetes  Renal/GU negative Renal ROS     Musculoskeletal   Abdominal   Peds  Hematology   Anesthesia Other Findings   Reproductive/Obstetrics                             Anesthesia Physical Anesthesia Plan  ASA: II  Anesthesia Plan: General   Post-op Pain Management:    Induction: Intravenous  PONV Risk Score and Plan: 1 and Ondansetron  Airway Management Planned: Oral ETT  Additional Equipment:   Intra-op Plan:   Post-operative Plan:   Informed Consent: I have reviewed the patients History and Physical, chart, labs and discussed the procedure including the risks, benefits and alternatives for the proposed anesthesia with the patient or authorized representative who has indicated his/her understanding and acceptance.       Plan Discussed with:   Anesthesia Plan Comments:         Anesthesia Quick Evaluation

## 2020-04-23 NOTE — Anesthesia Procedure Notes (Signed)
Procedure Name: Intubation Performed by: Zaul Hubers, CRNA Pre-anesthesia Checklist: Patient identified, Patient being monitored, Timeout performed, Emergency Drugs available and Suction available Patient Re-evaluated:Patient Re-evaluated prior to induction Oxygen Delivery Method: Circle system utilized Preoxygenation: Pre-oxygenation with 100% oxygen Induction Type: IV induction Ventilation: Mask ventilation without difficulty Laryngoscope Size: McGraph and 4 Grade View: Grade I Tube type: Oral Tube size: 7.0 mm Number of attempts: 1 Airway Equipment and Method: Stylet and Video-laryngoscopy Placement Confirmation: ETT inserted through vocal cords under direct vision,  positive ETCO2 and breath sounds checked- equal and bilateral Secured at: 22 cm Tube secured with: Tape Dental Injury: Teeth and Oropharynx as per pre-operative assessment        

## 2020-04-24 ENCOUNTER — Emergency Department
Admission: EM | Admit: 2020-04-24 | Discharge: 2020-04-24 | Disposition: A | Discharge status: Home / Self Care | Payer: Medicaid Other | Attending: Emergency Medicine | Admitting: Emergency Medicine

## 2020-04-24 ENCOUNTER — Inpatient Hospital Stay
Admission: EM | Admit: 2020-04-24 | Discharge: 2020-04-29 | DRG: 863 | Disposition: A | Discharge status: Home / Self Care | Payer: Medicaid Other | Attending: Internal Medicine | Admitting: Internal Medicine

## 2020-04-24 ENCOUNTER — Other Ambulatory Visit: Payer: Self-pay

## 2020-04-24 DIAGNOSIS — Z91040 Latex allergy status: Secondary | ICD-10-CM

## 2020-04-24 DIAGNOSIS — G8929 Other chronic pain: Secondary | ICD-10-CM

## 2020-04-24 DIAGNOSIS — Z96651 Presence of right artificial knee joint: Secondary | ICD-10-CM | POA: Diagnosis present

## 2020-04-24 DIAGNOSIS — Z91013 Allergy to seafood: Secondary | ICD-10-CM

## 2020-04-24 DIAGNOSIS — D122 Benign neoplasm of ascending colon: Secondary | ICD-10-CM | POA: Diagnosis not present

## 2020-04-24 DIAGNOSIS — Z880 Allergy status to penicillin: Secondary | ICD-10-CM

## 2020-04-24 DIAGNOSIS — Z79899 Other long term (current) drug therapy: Secondary | ICD-10-CM | POA: Diagnosis not present

## 2020-04-24 DIAGNOSIS — F17210 Nicotine dependence, cigarettes, uncomplicated: Secondary | ICD-10-CM | POA: Insufficient documentation

## 2020-04-24 DIAGNOSIS — F315 Bipolar disorder, current episode depressed, severe, with psychotic features: Secondary | ICD-10-CM

## 2020-04-24 DIAGNOSIS — K219 Gastro-esophageal reflux disease without esophagitis: Secondary | ICD-10-CM | POA: Diagnosis present

## 2020-04-24 DIAGNOSIS — M961 Postlaminectomy syndrome, not elsewhere classified: Secondary | ICD-10-CM | POA: Diagnosis present

## 2020-04-24 DIAGNOSIS — G8918 Other acute postprocedural pain: Secondary | ICD-10-CM | POA: Insufficient documentation

## 2020-04-24 DIAGNOSIS — R45851 Suicidal ideations: Secondary | ICD-10-CM | POA: Diagnosis present

## 2020-04-24 DIAGNOSIS — F1120 Opioid dependence, uncomplicated: Secondary | ICD-10-CM | POA: Diagnosis present

## 2020-04-24 DIAGNOSIS — G5600 Carpal tunnel syndrome, unspecified upper limb: Secondary | ICD-10-CM | POA: Diagnosis present

## 2020-04-24 DIAGNOSIS — Z882 Allergy status to sulfonamides status: Secondary | ICD-10-CM

## 2020-04-24 DIAGNOSIS — G894 Chronic pain syndrome: Secondary | ICD-10-CM | POA: Diagnosis present

## 2020-04-24 DIAGNOSIS — Z89012 Acquired absence of left thumb: Secondary | ICD-10-CM | POA: Insufficient documentation

## 2020-04-24 DIAGNOSIS — Z639 Problem related to primary support group, unspecified: Secondary | ICD-10-CM

## 2020-04-24 DIAGNOSIS — F329 Major depressive disorder, single episode, unspecified: Secondary | ICD-10-CM | POA: Diagnosis not present

## 2020-04-24 DIAGNOSIS — T8149XA Infection following a procedure, other surgical site, initial encounter: Secondary | ICD-10-CM

## 2020-04-24 DIAGNOSIS — L03311 Cellulitis of abdominal wall: Secondary | ICD-10-CM | POA: Diagnosis present

## 2020-04-24 DIAGNOSIS — Z590 Homelessness: Secondary | ICD-10-CM

## 2020-04-24 DIAGNOSIS — R1033 Periumbilical pain: Secondary | ICD-10-CM

## 2020-04-24 DIAGNOSIS — D124 Benign neoplasm of descending colon: Secondary | ICD-10-CM | POA: Diagnosis not present

## 2020-04-24 DIAGNOSIS — Z981 Arthrodesis status: Secondary | ICD-10-CM

## 2020-04-24 DIAGNOSIS — Z20822 Contact with and (suspected) exposure to covid-19: Secondary | ICD-10-CM | POA: Diagnosis present

## 2020-04-24 DIAGNOSIS — M549 Dorsalgia, unspecified: Secondary | ICD-10-CM | POA: Diagnosis present

## 2020-04-24 DIAGNOSIS — T8141XA Infection following a procedure, superficial incisional surgical site, initial encounter: Principal | ICD-10-CM | POA: Diagnosis present

## 2020-04-24 DIAGNOSIS — Z91048 Other nonmedicinal substance allergy status: Secondary | ICD-10-CM

## 2020-04-24 DIAGNOSIS — Z885 Allergy status to narcotic agent status: Secondary | ICD-10-CM

## 2020-04-24 DIAGNOSIS — Z888 Allergy status to other drugs, medicaments and biological substances status: Secondary | ICD-10-CM

## 2020-04-24 DIAGNOSIS — Z89022 Acquired absence of left finger(s): Secondary | ICD-10-CM

## 2020-04-24 LAB — ACETAMINOPHEN LEVEL: Acetaminophen (Tylenol), Serum: <10 ug/mL — ABNORMAL LOW (ref 10–30)

## 2020-04-24 LAB — CBC
HCT: 43.5 % (ref 39.0–52.0)
Hemoglobin: 15.1 g/dL (ref 13.0–17.0)
MCH: 31.1 pg (ref 26.0–34.0)
MCHC: 34.7 g/dL (ref 30.0–36.0)
MCV: 89.5 fL (ref 80.0–100.0)
Platelets: 284 10*3/uL (ref 150–400)
RBC: 4.86 MIL/uL (ref 4.22–5.81)
RDW: 13.5 % (ref 11.5–15.5)
WBC: 15.5 10*3/uL — ABNORMAL HIGH (ref 4.0–10.5)
nRBC: 0 % (ref 0.0–0.2)

## 2020-04-24 LAB — URINE DRUG SCREEN, QUALITATIVE (ARMC ONLY)
Amphetamines, Ur Screen: NOT DETECTED
Barbiturates, Ur Screen: NOT DETECTED
Benzodiazepine, Ur Scrn: POSITIVE — AB
Cannabinoid 50 Ng, Ur ~~LOC~~: NOT DETECTED
Cocaine Metabolite,Ur ~~LOC~~: POSITIVE — AB
MDMA (Ecstasy)Ur Screen: NOT DETECTED
Methadone Scn, Ur: NOT DETECTED
Opiate, Ur Screen: NOT DETECTED
Phencyclidine (PCP) Ur S: NOT DETECTED
Tricyclic, Ur Screen: NOT DETECTED

## 2020-04-24 LAB — COMPREHENSIVE METABOLIC PANEL
ALT: 19 U/L (ref 0–44)
AST: 45 U/L — ABNORMAL HIGH (ref 15–41)
Albumin: 4.2 g/dL (ref 3.5–5.0)
Alkaline Phosphatase: 121 U/L (ref 38–126)
Anion gap: 8 (ref 5–15)
BUN: 19 mg/dL (ref 6–20)
CO2: 25 mmol/L (ref 22–32)
Calcium: 8.4 mg/dL — ABNORMAL LOW (ref 8.9–10.3)
Chloride: 102 mmol/L (ref 98–111)
Creatinine, Ser: 1.35 mg/dL — ABNORMAL HIGH (ref 0.61–1.24)
GFR calc Af Amer: >60 mL/min (ref >60)
GFR calc non Af Amer: >60 mL/min (ref >60)
Glucose, Bld: 103 mg/dL — ABNORMAL HIGH (ref 70–99)
Potassium: 3.6 mmol/L (ref 3.5–5.1)
Sodium: 135 mmol/L (ref 135–145)
Total Bilirubin: 0.7 mg/dL (ref 0.3–1.2)
Total Protein: 7.8 g/dL (ref 6.5–8.1)

## 2020-04-24 LAB — RESPIRATORY PANEL BY RT PCR (FLU A&B, COVID)
Influenza A by PCR: NEGATIVE
Influenza B by PCR: NEGATIVE
SARS Coronavirus 2 by RT PCR: NEGATIVE

## 2020-04-24 LAB — ETHANOL: Alcohol, Ethyl (B): <10 mg/dL (ref <10)

## 2020-04-24 LAB — SALICYLATE LEVEL: Salicylate Lvl: <7 mg/dL — ABNORMAL LOW (ref 7.0–30.0)

## 2020-04-24 MED ORDER — NICOTINE 14 MG/24HR TD PT24: 14.0000 mg | MEDICATED_PATCH | Freq: Every day | TRANSDERMAL | Status: DC

## 2020-04-24 MED ORDER — CEPHALEXIN 500 MG PO CAPS
500.0000 mg | ORAL_CAPSULE | Freq: Three times a day (TID) | ORAL | Status: DC
  Administered 2020-04-24 – 2020-04-25 (×4): 500 mg via ORAL
  Filled 2020-04-24 (×7): qty 1

## 2020-04-24 MED ORDER — TIZANIDINE HCL 4 MG PO TABS
4.0000 mg | ORAL_TABLET | Freq: Four times a day (QID) | ORAL | Status: DC | PRN
  Administered 2020-04-26 – 2020-04-29 (×3): 4 mg via ORAL
  Filled 2020-04-24 (×6): qty 1

## 2020-04-24 MED ORDER — OXYCODONE-ACETAMINOPHEN 5-325 MG PO TABS
2.0000 | ORAL_TABLET | Freq: Once | ORAL | Status: AC
  Administered 2020-04-24: 2 via ORAL
  Filled 2020-04-24: qty 2

## 2020-04-24 MED ORDER — OXYCODONE HCL 5 MG PO TABS
5.0000 mg | ORAL_TABLET | Freq: Four times a day (QID) | ORAL | Status: DC | PRN
  Administered 2020-04-24 – 2020-04-26 (×5): 5 mg via ORAL
  Filled 2020-04-24 (×5): qty 1

## 2020-04-24 MED ORDER — SULFAMETHOXAZOLE-TRIMETHOPRIM 800-160 MG PO TABS
1.0000 | ORAL_TABLET | Freq: Two times a day (BID) | ORAL | Status: DC
  Administered 2020-04-24 – 2020-04-26 (×4): 1 via ORAL
  Filled 2020-04-24 (×7): qty 1

## 2020-04-24 MED ORDER — IBUPROFEN 400 MG PO TABS
800.0000 mg | ORAL_TABLET | Freq: Three times a day (TID) | ORAL | Status: DC | PRN
  Administered 2020-04-24 – 2020-04-26 (×5): 800 mg via ORAL
  Filled 2020-04-24 (×5): qty 1

## 2020-04-24 MED ORDER — HYDROXYZINE HCL 25 MG PO TABS
25.0000 mg | ORAL_TABLET | ORAL | Status: DC | PRN
  Administered 2020-04-24 – 2020-04-28 (×9): 25 mg via ORAL
  Filled 2020-04-24 (×9): qty 1

## 2020-04-24 MED ORDER — NICOTINE 14 MG/24HR TD PT24
14.0000 mg | MEDICATED_PATCH | Freq: Every day | TRANSDERMAL | Status: DC
  Administered 2020-04-24 – 2020-04-29 (×6): 14 mg via TRANSDERMAL
  Filled 2020-04-24 (×6): qty 1

## 2020-04-24 MED ORDER — GABAPENTIN 600 MG PO TABS
1200.0000 mg | ORAL_TABLET | Freq: Three times a day (TID) | ORAL | Status: DC
  Administered 2020-04-24 – 2020-04-29 (×14): 1200 mg via ORAL
  Filled 2020-04-24 (×14): qty 2

## 2020-04-24 MED ORDER — OXYCODONE HCL 5 MG PO TABS
5.0000 mg | ORAL_TABLET | Freq: Once | ORAL | Status: AC
  Administered 2020-04-24: 21:00:00 5 mg via ORAL
  Filled 2020-04-24: qty 1

## 2020-04-24 MED ORDER — OXYCODONE-ACETAMINOPHEN 5-325 MG PO TABS
1.0000 | ORAL_TABLET | ORAL | Status: AC
  Administered 2020-04-24: 1 via ORAL
  Filled 2020-04-24: qty 1

## 2020-04-24 NOTE — ED Notes (Signed)
Pt continues to complain of abd pain due to his surgical procedure yesterday. ABD is distended and firm with BS+ x 4 quads. Slight redness with warmth noted around the area. Area of redness marked.  MD informed and will come to see pt in a little bit.

## 2020-04-24 NOTE — ED Provider Notes (Signed)
[facility] Emergency Department Provider Note  ____________________________________________   First MD Initiated Contact with Patient 04/24/20 [phone]     (approximate)  I have reviewed the triage vital signs and the nursing notes.   HISTORY  Chief Complaint Abdominal Pain and Post-op Problem    HPI Johnny [name]. is a 49 y.o. male  Here with pain aroudn surgical site. Pt just underwent scar excision and hernia repair of umbilical hernia with Dr. [name] yesterday. Tolerated well and went home. He was turning to get something in his car today when he felt something "pop." He has since had ongoing aching, throbbing pain in his abdomen with worsening swelling. No nausea, vomiting. No alleviating factors. No other complaints. Denies any bleeding or oozing through his steristrips.        Past Medical History:  Diagnosis Date  . Arthritis    hands, knees, lower back  . Bilateral sciatica   . Carpal tunnel syndrome   . Chronic back pain   . Chronic neck pain   . Chronic pain 03/12/2020   has been connected to pain management clinics. currently waiting to see psychiatrist before he can join [facility]  . Chronic pain syndrome   . GERD (gastroesophageal reflux disease)   . Hand joint pain 07/02/2013  . Herniated disc   . Lives in homeless shelter 02/2020   housing ends as of 03/25/20. going to hotel after surgery  . Traumatic amputation of left index finger 2013  . Wears dentures    full upper and lower    Patient Active Problem List   Diagnosis Date Noted  . Recurrent ventral hernia   . Direct left inguinal hernia   . Wound dehiscence 12/19/2019  . Wound infection after surgery 12/06/2019  . CSF leak 11/28/2019  . Postoperative CSF leak 11/28/2019  . HNP (herniated nucleus pulposus), lumbar 11/18/2019  . Umbilical hernia without obstruction and without gangrene 10/23/2019  . Osteoarthritis of spine with radiculopathy, lumbar region  10/09/2019  . Chronic bilateral low back pain with bilateral sciatica 10/07/2019  . S/P cervical spinal fusion (C5-C6 ACDF) 07/15/2019  . Cervical radicular pain 07/15/2019  . Cervical facet joint syndrome 07/15/2019  . Degeneration of lumbar intervertebral disc 07/03/2019  . Lumbar radicular pain 07/03/2019  . Cervical spondylosis 04/17/2019  . Homeless single person 04/17/2019  . History of total knee replacement, right 04/25/2018  . Pancreatic mass 01/14/2018  . Tobacco abuse 01/14/2018  . Chronic neck pain   . Benign neoplasm of descending colon   . Benign neoplasm of ascending colon   . Intractable vomiting with nausea   . Reflux esophagitis   . Duodenal ulcer without hemorrhage or perforation   . Chronic pain syndrome 08/07/2017  . Vitamin D deficiency 06/20/2017  . Insomnia 06/19/2017  . Right sided sciatica   . Presence of artificial knee joint 01/17/2013  . Knee pain 01/17/2013  . 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 AMPUTATION FINGER / THUMB Left 2013  . COLONOSCOPY WITH PROPOFOL N/A 01/03/2018   Procedure: COLONOSCOPY WITH PROPOFOL;  Surgeon: [name], MD;  Location: [place];  Service: Endoscopy;  Laterality: N/A;  . ESOPHAGOGASTRODUODENOSCOPY (EGD) WITH PROPOFOL N/A 01/03/2018   Procedure: ESOPHAGOGASTRODUODENOSCOPY (EGD) WITH PROPOFOL;  Surgeon: [name], MD;  Location: [place];  Service: Endoscopy;  Laterality: N/A;  . HERNIA REPAIR Right  right inguinal hernia repaired twice  . HIP SURGERY  2006   took bone out of hip to put into neck  . JOINT REPLACEMENT Right    knee  . KNEE SURGERY Right   . LUMBAR LAMINECTOMY/DECOMPRESSION MICRODISCECTOMY N/A 11/18/2019   Procedure: LUMBAR FOUR-FIVE LUMBAR LAMINECTOMY/DECOMPRESSION MICRODISCECTOMY;  Surgeon: [name], MD;  Location: [place];  Service: Neurosurgery;  Laterality: N/A;  .  LUMBAR WOUND DEBRIDEMENT N/A 12/06/2019   Procedure: LUMBAR WOUND REVISION;  Surgeon: [name], MD;  Location: [place];  Service: Neurosurgery;  Laterality: N/A;  . neck fusion    . POLYPECTOMY N/A 01/03/2018   Procedure: POLYPECTOMY;  Surgeon: [name], MD;  Location: [place];  Service: Endoscopy;  Laterality: N/A;  . REPAIR OF CEREBROSPINAL FLUID LEAK N/A 11/28/2019   Procedure: REPAIR OF CEREBROSPINAL FLUID LEAK/LUMBAR;  Surgeon: [name], MD;  Location: [place];  Service: Neurosurgery;  Laterality: N/A;  REPAIR OF CEREBROSPINAL FLUID LEAK/LUMBAR  . REPLACEMENT TOTAL KNEE Right   . SHOULDER SURGERY Right 2016 X 2  . SPINAL FUSION    . TONSILLECTOMY    . UMBILICAL HERNIA REPAIR N/A 11/07/2019   Procedure: HERNIA REPAIR UMBILICAL ADULT;  Surgeon: [name], MD;  Location: [facility];  Service: General;  Laterality: N/A;  . WOUND EXPLORATION N/A 12/15/2019   Procedure: LUMBAR WOUND EXPLORATION;  Surgeon: [name], MD;  Location: [place];  Service: Neurosurgery;  Laterality: N/A;  LUMBAR WOUND EXPLORATION    Prior to Admission medications   Medication Sig Start Date End Date Taking? Authorizing Provider  acetaminophen (TYLENOL) 500 MG tablet Take 2 tablets (1,000 mg total) by mouth every 8 (eight) hours. 04/23/20 05/23/20  [name], MD  diphenhydramine-acetaminophen (TYLENOL PM) 25-500 MG TABS tablet Take 2 tablets by mouth at bedtime as needed (sleep).     [provider]  gabapentin (NEURONTIN) 600 MG tablet Take 2 tablets (1,200 mg total) by mouth 3 (three) times daily. 04/12/20   Johnson, Megan P, DO  hydrOXYzine (VISTARIL) 25 MG capsule Take 25 mg by mouth every 4 (four) hours as needed for anxiety. 04/07/20   [provider]  ibuprofen (ADVIL) 800 MG tablet Take 1 tablet (800 mg total) by mouth every 8 (eight) hours as needed for fever, moderate pain or cramping. 04/23/20   [name], MD  lithium carbonate (ESKALITH) 450 MG CR  tablet Take 450 mg by mouth 2 (two) times daily. 04/07/20   [provider]  OLANZapine (ZYPREXA) 10 MG tablet Take 10 mg by mouth at bedtime. 04/07/20   [provider]  OLANZapine (ZYPREXA) 5 MG tablet Take 5 mg by mouth daily. 04/07/20   [provider]  oxyCODONE (OXY IR/ROXICODONE) 5 MG immediate release tablet Take 1 tablet (5 mg total) by mouth every 6 (six) hours as needed for severe pain. 04/23/20   [name], MD  tiZANidine (ZANAFLEX) 4 MG capsule Take 2 capsules (8 mg total) by mouth 3 (three) times daily as needed for muscle spasms. Patient taking differently: Take 8 mg by mouth every 6 (six) hours as needed for muscle spasms.  04/12/20   Johnson, Megan P, DO  Triamcinolone Acetonide (GOODSENSE NASAL ALLERGY SPRAY NA) Place 1 spray into the nose daily as needed (allergies).    [provider]  varenicline (CHANTIX CONTINUING MONTH PAK) 1 MG tablet Take 1 tablet (1 mg total) by mouth 2 (two) times daily. Patient not taking: Reported on 04/23/2020 03/02/20   Valerie Roys, DO  Allergies Crab [shellfish allergy], Flexeril [cyclobenzaprine], Codeine, Latex, Robaxin [methocarbamol], Sulfamethoxazole-trimethoprim, and Tramadol  Family History  Problem Relation Age of Onset  . Cancer Father        sarcoma  . Cirrhosis Paternal Grandmother   . Cancer Paternal Grandfather        Pancreatic  . Fibromyalgia Sister   . Deafness Sister     Social History Social History   Tobacco Use  . Smoking status: Current Every Day Smoker    Packs/day: 0.50    Years: 25.00    Pack years: 12.50    Types: Cigarettes  . Smokeless tobacco: Never Used  . Tobacco comment: will start Chantix soon.  Substance Use Topics  . Alcohol use: No  . Drug use: No    Review of Systems  Review of Systems  Constitutional: Positive for fatigue. Negative for chills and fever.  HENT: Negative for sore throat.   Respiratory: Negative for shortness of breath.     Cardiovascular: Negative for chest pain.  Gastrointestinal: Positive for abdominal pain.  Genitourinary: Negative for flank pain.  Musculoskeletal: Negative for neck pain.  Skin: Positive for wound. Negative for rash.  Allergic/Immunologic: Negative for immunocompromised state.  Neurological: Negative for weakness and numbness.  Hematological: Does not bruise/bleed easily.  All other systems reviewed and are negative.    ____________________________________________  PHYSICAL EXAM:      VITAL SIGNS: ED Triage Vitals [04/24/20 0913]  Enc Vitals Group     BP (!) 168/97     Pulse Rate (!) 101     Resp 16     Temp 98.4 F (36.9 C)     Temp Source Oral     SpO2 98 %     Weight 200 lb (90.7 kg)     Height 6' (1.829 m)     Head Circumference      Peak Flow      Pain Score 10     Pain Loc      Pain Edu?      Excl. in [place]?      Physical Exam Vitals and nursing note reviewed.  Constitutional:      General: He is not in acute distress.    Appearance: He is well-developed.  HENT:     Head: Normocephalic and atraumatic.  Eyes:     Conjunctiva/sclera: Conjunctivae normal.  Cardiovascular:     Rate and Rhythm: Normal rate and regular rhythm.     Heart sounds: Normal heart sounds.  Pulmonary:     Effort: Pulmonary effort is normal. No respiratory distress.     Breath sounds: No wheezing.  Abdominal:     General: There is no distension.     Comments: See below for image. Steristrips intact superior to umbilicus, with surrounding ecchymoses and tenderness.  Musculoskeletal:     Cervical back: Neck supple.  Skin:    General: Skin is warm.     Capillary Refill: Capillary refill takes less than 2 seconds.     Findings: No rash.  Neurological:     Mental Status: He is alert and oriented to person, place, and time.     Motor: No abnormal muscle tone.           ____________________________________________   LABS (all labs ordered are listed, but only abnormal results  are displayed)  Labs Reviewed - No data to display  ____________________________________________  EKG:  ________________________________________  RADIOLOGY All imaging, including plain films, CT scans, and ultrasounds, independently reviewed by  me, and interpretations confirmed via formal radiology reads.  ED MD interpretation:     Official radiology report(s): No results found.  ____________________________________________  PROCEDURES   Procedure(s) performed (including Critical Care):  Procedures  ____________________________________________  INITIAL IMPRESSION / MDM / [place] / ED COURSE  As part of my medical decision making, I reviewed the following data within the [place] [facility] [facility] notes reviewed and incorporated, Old chart reviewed, Notes from prior ED visits, and [place] Controlled Substance Database       *Johnny [name]. was evaluated in Emergency Department on 04/24/2020 for the symptoms described in the history of present illness. He was evaluated in the context of the global COVID-19 pandemic, which necessitated consideration that the patient might be at risk for infection with the SARS-CoV-2 virus that causes COVID-19. Institutional protocols and algorithms that pertain to the evaluation of patients at risk for COVID-19 are in a state of rapid change based on information released by regulatory bodies including the CDC and federal and state organizations. These policies and algorithms were followed during the patient's care in the ED.  Some ED evaluations and interventions may be delayed as a result of limited staffing during the pandemic.*     Medical Decision Making: 49 year old male here with pain around his umbilical hernia repair site.  On exam, his incision is clean, dry, and intact.  He has some ecchymosis around the area which is likely expected after his recent operation.  Suspect he may have a mild seroma at his umbilicus as  well.  Discussed with Dr. [name], who reviewed images of the patient's abdomen today.  Patient is otherwise afebrile, well-appearing, with no other abdominal tenderness.  He is moving his bowels regularly.  No vomiting.  No signs to suggest intra-abdominal pathology.  Suspect he may have small contusion or injury secondary to his twisting.  I encouraged him to adhere strictly to Dr. Glenford Peers precautions, and follow-up in the clinic.  No emergent indication for imaging or further work-up. No signs of infection or wound dehiscence. Given his level of pain, will give a dose of additional pain medications here.  ____________________________________________  FINAL CLINICAL IMPRESSION(S) / ED DIAGNOSES  Final diagnoses:  Periumbilical abdominal pain  Post-operative pain     MEDICATIONS GIVEN DURING THIS VISIT:  Medications  oxyCODONE-acetaminophen (PERCOCET/ROXICET) 5-325 MG per tablet 2 tablet (2 tablets Oral Given 04/24/20 0955)     ED Discharge Orders    None       Note:  This document was prepared using Dragon voice recognition software and may include unintentional dictation errors.   Duffy Bruce, MD 04/24/20 1046
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
## 2020-04-24 NOTE — Anesthesia Postprocedure Evaluation (Signed)
Anesthesia Post Note  Patient: Lajuan Paschen.  Procedure(s) Performed: HERNIA REPAIR UMBILICAL ADULT (N/A ) HERNIA REPAIR INGUINAL ADULT (Left )  Patient location during evaluation: PACU Anesthesia Type: General Level of consciousness: awake and alert Pain management: pain level controlled Vital Signs Assessment: post-procedure vital signs reviewed and stable Respiratory status: spontaneous breathing, nonlabored ventilation, respiratory function stable and patient connected to nasal cannula oxygen Cardiovascular status: blood pressure returned to baseline and stable Postop Assessment: no apparent nausea or vomiting Anesthetic complications: no     Last Vitals:  Vitals:   04/23/20 1700 04/23/20 1723  BP: 124/84 125/74  Pulse: 90 88  Resp: 17 17  Temp: 36.6 C 36.4 C  SpO2:      Last Pain:  Vitals:   04/23/20 1723  PainSc: Asleep                 Precious Haws Laine Giovanetti

## 2020-04-24 NOTE — ED Notes (Signed)
Dr Joni Fears in to see pt.

## 2020-04-24 NOTE — ED Triage Notes (Signed)
Pt arrives to ER for SI thoughts x few weeks. States "I have 2 plans." pt was seen this AM for pain at incision site after hernia repair. Pt states "Donnald Garre been losing my temper a lot and been really depressed." states he's having visual hallucinations. A&O, ambulatory.   Pt states was sent to [facility] and started on new meds recently.

## 2020-04-24 NOTE — Discharge Instructions (Addendum)
Wrap a thin towel around a bag of ice and apply to the area of pain/swelling to help with pain. Do not leave ice on for >15 min at a time.  Continue your Oxycodone prescribed by Dr. [name]. Given that she just prescribed you 15, we cannot give additional medications. Call on Monday to discuss.  Take Tylenol 1000 mg every 8 hours in addition to the oxycodone

## 2020-04-24 NOTE — ED Provider Notes (Signed)
[facility] Emergency Department Provider Note   ____________________________________________   First MD Initiated Contact with Patient 04/24/20 1344     (approximate)  I have reviewed the triage vital signs and the nursing notes.   HISTORY  Chief Complaint Suicidal    HPI Johnny [name]. is a 49 y.o. male here for evaluation for suicidal thoughts  Patient reports that he is had for several days now paranoia.  Point yesterday drove around [facility] stopped on [address], and felt like a tractor trailer and 4 cars were there just a plan to block him in to come on to him down  Reports the symptoms have been present and worsening over the last several months after the death of a family member and also a previous back surgery that required several revisions  He had surgery yesterday, and umbilical surgery he still having pain discomfort from that was seen in the ER earlier today for same.  He decided he better check back in given the paranoid symptoms he has been experiencing recently, reports they did not start immediately yesterday but he has been experiencing them now for several weeks including some suicidal thoughts at times think he might just take his [name] and driving into a tree  Denies overdose or ingestion  Reports previous admission for psychiatric care in the past   Past Medical History:  Diagnosis Date  . Arthritis    hands, knees, lower back  . Bilateral sciatica   . Carpal tunnel syndrome   . Chronic back pain   . Chronic neck pain   . Chronic pain 03/12/2020   has been connected to pain management clinics. currently waiting to see psychiatrist before he can join [facility]  . Chronic pain syndrome   . GERD (gastroesophageal reflux disease)   . Hand joint pain 07/02/2013  . Herniated disc   . Lives in homeless shelter 02/2020   housing ends as of 03/25/20. going to hotel after surgery  . Traumatic amputation of left index  finger 2013  . Wears dentures    full upper and lower    Patient Active Problem List   Diagnosis Date Noted  . Recurrent ventral hernia   . Direct left inguinal hernia   . Wound dehiscence 12/19/2019  . Wound infection after surgery 12/06/2019  . CSF leak 11/28/2019  . Postoperative CSF leak 11/28/2019  . HNP (herniated nucleus pulposus), lumbar 11/18/2019  . Umbilical hernia without obstruction and without gangrene 10/23/2019  . Osteoarthritis of spine with radiculopathy, lumbar region 10/09/2019  . Chronic bilateral low back pain with bilateral sciatica 10/07/2019  . S/P cervical spinal fusion (C5-C6 ACDF) 07/15/2019  . Cervical radicular pain 07/15/2019  . Cervical facet joint syndrome 07/15/2019  . Degeneration of lumbar intervertebral disc 07/03/2019  . Lumbar radicular pain 07/03/2019  . Cervical spondylosis 04/17/2019  . Homeless single person 04/17/2019  . History of total knee replacement, right 04/25/2018  . Pancreatic mass 01/14/2018  . Tobacco abuse 01/14/2018  . Chronic neck pain   . Benign neoplasm of descending colon   . Benign neoplasm of ascending colon   . Intractable vomiting with nausea   . Reflux esophagitis   . Duodenal ulcer without hemorrhage or perforation   . Chronic pain syndrome 08/07/2017  . Vitamin D deficiency 06/20/2017  . Insomnia 06/19/2017  . Right sided sciatica   . Presence of artificial knee joint 01/17/2013  . Knee pain 01/17/2013  . Neuropathic pain  of hand 11/29/2012  . 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 AMPUTATION FINGER / THUMB Left 2013  . COLONOSCOPY WITH PROPOFOL N/A 01/03/2018   Procedure: COLONOSCOPY WITH PROPOFOL;  Surgeon: [name], MD;  Location: [place];  Service: Endoscopy;  Laterality: N/A;  . ESOPHAGOGASTRODUODENOSCOPY (EGD) WITH PROPOFOL N/A 01/03/2018   Procedure: ESOPHAGOGASTRODUODENOSCOPY (EGD) WITH PROPOFOL;  Surgeon: [name],  MD;  Location: [place];  Service: Endoscopy;  Laterality: N/A;  . HERNIA REPAIR Right    right inguinal hernia repaired twice  . HIP SURGERY  2006   took bone out of hip to put into neck  . JOINT REPLACEMENT Right    knee  . KNEE SURGERY Right   . LUMBAR LAMINECTOMY/DECOMPRESSION MICRODISCECTOMY N/A 11/18/2019   Procedure: LUMBAR FOUR-FIVE LUMBAR LAMINECTOMY/DECOMPRESSION MICRODISCECTOMY;  Surgeon: [name], MD;  Location: [place];  Service: Neurosurgery;  Laterality: N/A;  . LUMBAR WOUND DEBRIDEMENT N/A 12/06/2019   Procedure: LUMBAR WOUND REVISION;  Surgeon: [name], MD;  Location: [place];  Service: Neurosurgery;  Laterality: N/A;  . neck fusion    . POLYPECTOMY N/A 01/03/2018   Procedure: POLYPECTOMY;  Surgeon: [name], MD;  Location: [place];  Service: Endoscopy;  Laterality: N/A;  . REPAIR OF CEREBROSPINAL FLUID LEAK N/A 11/28/2019   Procedure: REPAIR OF CEREBROSPINAL FLUID LEAK/LUMBAR;  Surgeon: [name], MD;  Location: [place];  Service: Neurosurgery;  Laterality: N/A;  REPAIR OF CEREBROSPINAL FLUID LEAK/LUMBAR  . REPLACEMENT TOTAL KNEE Right   . SHOULDER SURGERY Right 2016 X 2  . SPINAL FUSION    . TONSILLECTOMY    . UMBILICAL HERNIA REPAIR N/A 11/07/2019   Procedure: HERNIA REPAIR UMBILICAL ADULT;  Surgeon: [name], MD;  Location: [facility];  Service: General;  Laterality: N/A;  . WOUND EXPLORATION N/A 12/15/2019   Procedure: LUMBAR WOUND EXPLORATION;  Surgeon: [name], MD;  Location: [place];  Service: Neurosurgery;  Laterality: N/A;  LUMBAR WOUND EXPLORATION    Prior to Admission medications   Medication Sig Start Date End Date Taking? Authorizing Provider  acetaminophen (TYLENOL) 500 MG tablet Take 2 tablets (1,000 mg total) by mouth every 8 (eight) hours. 04/23/20 05/23/20 Yes [name], MD  diphenhydramine-acetaminophen (TYLENOL PM) 25-500 MG TABS tablet Take 2 tablets by mouth at bedtime as needed (sleep).     Yes [provider]  gabapentin (NEURONTIN) 600 MG tablet Take 2 tablets (1,200 mg total) by mouth 3 (three) times daily. 04/12/20  Yes Johnson, Megan P, DO  hydrOXYzine (VISTARIL) 25 MG capsule Take 25 mg by mouth every 4 (four) hours as needed for anxiety. 04/07/20  Yes [provider]  lithium carbonate (ESKALITH) 450 MG CR tablet Take 450 mg by mouth 2 (two) times daily. 04/07/20  Yes [provider]  OLANZapine (ZYPREXA) 10 MG tablet Take 10 mg by mouth at bedtime. 04/07/20  Yes [provider]  OLANZapine (ZYPREXA) 5 MG tablet Take 5 mg by mouth daily. 04/07/20  Yes [provider]  oxyCODONE (OXY IR/ROXICODONE) 5 MG immediate release tablet Take 1 tablet (5 mg total) by mouth every 6 (six) hours as needed for severe pain. 04/23/20  Yes [name], MD  tiZANidine (ZANAFLEX) 4 MG tablet Take 4 mg by mouth every 6 (six) hours as needed for muscle spasms.   Yes [provider]  Triamcinolone Acetonide (GOODSENSE NASAL ALLERGY SPRAY NA) Place 1 spray into the nose daily as  needed (allergies).   Yes [provider]    Allergies Crab [shellfish allergy], Flexeril [cyclobenzaprine], Codeine, Latex, Robaxin [methocarbamol], Sulfamethoxazole-trimethoprim, and Tramadol  Family History  Problem Relation Age of Onset  . Cancer Father        sarcoma  . Cirrhosis Paternal Grandmother   . Cancer Paternal Grandfather        Pancreatic  . Fibromyalgia Sister   . Deafness Sister    Patient self reports he rarely will use alcohol.  Does smoke.   Social History Social History   Tobacco Use  . Smoking status: Current Every Day Smoker    Packs/day: 0.50    Years: 25.00    Pack years: 12.50    Types: Cigarettes  . Smokeless tobacco: Never Used  . Tobacco comment: will start Chantix soon.  Substance Use Topics  . Alcohol use: No  . Drug use: No    Review of Systems Constitutional: No fever/chills Eyes: No visual changes. ENT:  No sore throat. Cardiovascular: Denies chest pain. Respiratory: Denies shortness of breath. Gastrointestinal: Soreness around his bellybutton already had surgery.  Bowels are moving.  No vomiting.  Occasional nausea. Genitourinary: Negative for dysuria. Musculoskeletal: Negative for back pain except for chronic right leg sciatica. Skin: Negative for rash. Neurological: Negative for headaches, areas of focal weakness or numbness.    ____________________________________________   PHYSICAL EXAM:  VITAL SIGNS: ED Triage Vitals [04/24/20 1306]  Enc Vitals Group     BP 139/88     Pulse Rate 100     Resp 18     Temp 98.6 F (37 C)     Temp Source Oral     SpO2 97 %     Weight 200 lb (90.7 kg)     Height 6' (1.829 m)     Head Circumference      Peak Flow      Pain Score 8     Pain Loc      Pain Edu?      Excl. in [place]?     Constitutional: Alert and oriented. Well appearing and in no acute distress.  Does not appear to be actively hallucinating or actively paranoid of me. Eyes: Conjunctivae are normal. Head: Atraumatic. Nose: No congestion/rhinnorhea. Mouth/Throat: Mucous membranes are moist. Neck: No stridor.  Cardiovascular: Normal rate, regular rhythm. Grossly normal heart sounds.  Good peripheral circulation. Respiratory: Normal respiratory effort.  No retractions. Lungs CTAB. Gastrointestinal: Soft, slightly distended.  Umbilical hernia present.  Bruising surrounding the hernia. Musculoskeletal: No lower extremity tenderness nor edema. Neurologic:  Normal speech and language. No gross focal neurologic deficits are appreciated.  Skin:  Skin is warm, dry and intact. No rash noted. Psychiatric: Mood and affect are normal. Speech and behavior are normal.  ____________________________________________   LABS (all labs ordered are listed, but only abnormal results are displayed)  Labs Reviewed  COMPREHENSIVE METABOLIC PANEL - Abnormal; Notable for the following components:        Result Value   Glucose, Bld 103 (*)    Creatinine, Ser 1.35 (*)    Calcium 8.4 (*)    AST 45 (*)    All other components within normal limits  SALICYLATE LEVEL - Abnormal; Notable for the following components:   Salicylate Lvl [id number] (*)    All other components within normal limits  ACETAMINOPHEN LEVEL - Abnormal; Notable for the following components:   Acetaminophen (Tylenol), Serum <10 (*)    All other components within normal limits  CBC -  Abnormal; Notable for the following components:   WBC 15.5 (*)    All other components within normal limits  URINE DRUG SCREEN, QUALITATIVE ([facility] ONLY) - Abnormal; Notable for the following components:   Cocaine Metabolite,Ur [place] POSITIVE (*)    Benzodiazepine, Ur Scrn POSITIVE (*)    All other components within normal limits  RESPIRATORY PANEL BY RT PCR (FLU A&B, COVID)  ETHANOL   ____________________________________________  EKG   ____________________________________________  RADIOLOGY   ____________________________________________   PROCEDURES  Procedure(s) performed: None  Procedures  Critical Care performed: No  ____________________________________________   INITIAL IMPRESSION / ASSESSMENT AND PLAN / ED COURSE  Pertinent labs & imaging results that were available during my care of the patient were reviewed by me and considered in my medical decision making (see chart for details).   Patient evaluated for postop umbilical hernia evaluation earlier today, discharged and evaluated carefully for that by Dr. Ellender Hose.  Now representing for concerns that he is been experiencing some suicidal thoughts and paranoia developing over several weeks to months time  He is in no acute distress.  I will place him under IVC though as he reports a plan to drive his [name] into a tree and also [place] around [place] thinking people were chasing after him with cars and semitractors  He is calm, appropriate clinical exam  given his postop status does not appear to have acute complication from this.  Ongoing ER care signed to Dr. Nancie Neas Toya Smothers. was evaluated in Emergency Department on 04/24/2020 for the symptoms described in the history of present illness. He was evaluated in the context of the global COVID-19 pandemic, which necessitated consideration that the patient might be at risk for infection with the SARS-CoV-2 virus that causes COVID-19. Institutional protocols and algorithms that pertain to the evaluation of patients at risk for COVID-19 are in a state of rapid change based on information released by regulatory bodies including the CDC and federal and state organizations. These policies and algorithms were followed during the patient's care in the ED.     ----------------------------------------- 2:29 PM on 04/24/2020 -----------------------------------------  Patient medically cleared for psychiatric evaluation at this time.  Ongoing care and ED's disposition assigned to Dr. Joni Fears  ____________________________________________   FINAL CLINICAL IMPRESSION(S) / ED DIAGNOSES  Final diagnoses:  Suicidal thoughts        Note:  This document was prepared using Dragon voice recognition software and may include unintentional dictation errors       Delman Kitten, MD 04/24/20 1556

## 2020-04-24 NOTE — ED Provider Notes (Signed)
Procedures     ----------------------------------------- 10:22 PM on 04/24/2020 -----------------------------------------  Patient complains of worsening redness and pain of his anterior abdomen after ventral hernia repair with mesh yesterday.  Area does show erythema warmth tenderness in the periumbilical space, irregular border, most likely developing superficial cellulitis.  No fluctuance, no crepitus, no purulent drainage, no dehiscence of surgical incisions.  Abdomen exam is otherwise benign, no rebound rigidity or guarding, doubt peritonitis.  I will start the patient on Keflex and Bactrim.    Carrie Mew, MD 04/24/20 2223

## 2020-04-24 NOTE — ED Triage Notes (Signed)
Pt arrives to ER POV. Had abdominal hernia surgery yesterday. Reached around to pick up a gallon jug of water and felt pain in incision site. No bleeding noted. Still has steri-strips over incision site. Unsure if opened it up. Bruising at site. A&O, ambulatory.   Pt refusing IV and blood work at this time. Took oxycodone an hour ago.

## 2020-04-24 NOTE — Consult Note (Addendum)
[facility] Consult   Reason for Consult:  Paranoia Referring Physician:  Dr. Jacqualine Code Patient Identification: Johnny [name]. MRN:  [id number] Principal Diagnosis: <principal problem not specified> Diagnosis:  Active Problems:   * No active hospital problems. *   Total Time spent with patient: 45 minutes  Subjective:   Johnny Bolder Shady Bradish. is a 49 y.o. male patient admitted with complicated medical history involving numerous surgeries, chronic back/neck/knee pain, depression, Bipolar, and chronic opiate use. He presents to the hospital today with complaints of suicidal ideations with a plan to drive his [name] into a tree or overdose on an 8 ball of cocaine and fifth of liquor "not wake up". He states he did some cocaine last night and he began to feel paranoid. It was around this time he started feeling as though people were talking about him and he seen people taking the doors off the hinges at his hotel. He states his most recent stressor is being homeless and chronic pain. He does not want to return to his boarding house or shelter. He is requesting that he get assistance with long term rehab such as [facility] or [facility]. "Once I get my mental together I want to go to a place for long term. Can yall send me to a drug rehab place? I need to stay somewhere for a long time." Patient currently denies active suicidal ideations. He appears to be focused on his pain from his recent surgery yesterday( umbilical hernia repair).   HPI:  Johnny [name]. is a 49 y.o. male here for evaluation for suicidal thoughts  Patient reports that he is had for several days now paranoia.  Point yesterday drove around [facility] stopped on [address], and felt like a tractor trailer and 4 cars were there just a plan to block him in to come on to him down  Reports the symptoms have been present and worsening over the last several months after the death of a family member and also a previous  back surgery that required several revisions  He had surgery yesterday, and umbilical surgery he still having pain discomfort from that was seen in the ER earlier today for same.  He decided he better check back in given the paranoid symptoms he has been experiencing recently, reports they did not start immediately yesterday but he has been experiencing them now for several weeks including some suicidal thoughts at times think he might just take his [name] and driving into a tree  Denies overdose or ingestion  Reports previous admission for psychiatric care in the past  Assessment: 49 year old male who presents to the ER endorsing paranoia, suicidal ideations and anger. He states he has been depressed since his father died two years ago. He reports he started using drugs around the time his father passed to cope with the pain of death. He states he has been suicidal for quiet some time, and wants to get help or see if there is anything worth living for. He recently had surgery yesterday and was also seen in the ED this morning for abdominal pain, with no mention of suicidal ideations or paranoia. He returned later in this afternoon endorsing SI with a plan. He was recently discharged from [facility] after a 10 day length of stay per patient. He states they gave me some medication that may take 1-2 weeks to start working and then they let me go. Explained to patient the goal of acute hospitalization  vs long term hospitalization. He later admits that he is homeless and needs help with housing. Per chart review he has been working closely with [facility] team to find housing however he has declined them citing unsanitary conditions and toxic environment. At this time patient has normal thought processes, he does not appear to be responding to internal stimuli. He has normal judgement, insight, and impulsivity, and appears to be more focused on his pain noting that '5mg'$  of Oxycodone is not helping for his pain and  that we might as well not give it to him. Patient has a long standing history of substance abuse, chronic opiate use. At this time he denies suicidal ideation, homicidal ideations, and or hallucinations.   Past Psychiatric History:Depression and Bipolar. No reported suicide attempts. Currently taking Lithium and Vistaril. He has not followed up with outpatient since his most recent admission to [facility] due to surgery.   Risk to Self:   Yes Risk to Others:   No Prior Inpatient Therapy:   [facility] x1 03/2020 Prior Outpatient Therapy:  None at this time  Past Medical History:  Past Medical History:  Diagnosis Date  . Arthritis    hands, knees, lower back  . Bilateral sciatica   . Carpal tunnel syndrome   . Chronic back pain   . Chronic neck pain   . Chronic pain 03/12/2020   has been connected to pain management clinics. currently waiting to see psychiatrist before he can join [facility]  . Chronic pain syndrome   . GERD (gastroesophageal reflux disease)   . Hand joint pain 07/02/2013  . Herniated disc   . Lives in homeless shelter 02/2020   housing ends as of 03/25/20. going to hotel after surgery  . Traumatic amputation of left index finger 2013  . Wears dentures    full upper and lower    Past Surgical History:  Procedure Laterality Date  . AMPUTATION FINGER / THUMB Left 2013  . COLONOSCOPY WITH PROPOFOL N/A 01/03/2018   Procedure: COLONOSCOPY WITH PROPOFOL;  Surgeon: [name], MD;  Location: [place];  Service: Endoscopy;  Laterality: N/A;  . ESOPHAGOGASTRODUODENOSCOPY (EGD) WITH PROPOFOL N/A 01/03/2018   Procedure: ESOPHAGOGASTRODUODENOSCOPY (EGD) WITH PROPOFOL;  Surgeon: [name], MD;  Location: [place];  Service: Endoscopy;  Laterality: N/A;  . HERNIA REPAIR Right    right inguinal hernia repaired twice  . HIP SURGERY  2006   took bone out of hip to put into neck  . JOINT REPLACEMENT Right    knee  . KNEE SURGERY Right   . LUMBAR  LAMINECTOMY/DECOMPRESSION MICRODISCECTOMY N/A 11/18/2019   Procedure: LUMBAR FOUR-FIVE LUMBAR LAMINECTOMY/DECOMPRESSION MICRODISCECTOMY;  Surgeon: [name], MD;  Location: [place];  Service: Neurosurgery;  Laterality: N/A;  . LUMBAR WOUND DEBRIDEMENT N/A 12/06/2019   Procedure: LUMBAR WOUND REVISION;  Surgeon: [name], MD;  Location: [place];  Service: Neurosurgery;  Laterality: N/A;  . neck fusion    . POLYPECTOMY N/A 01/03/2018   Procedure: POLYPECTOMY;  Surgeon: [name], MD;  Location: [place];  Service: Endoscopy;  Laterality: N/A;  . REPAIR OF CEREBROSPINAL FLUID LEAK N/A 11/28/2019   Procedure: REPAIR OF CEREBROSPINAL FLUID LEAK/LUMBAR;  Surgeon: [name], MD;  Location: [place];  Service: Neurosurgery;  Laterality: N/A;  REPAIR OF CEREBROSPINAL FLUID LEAK/LUMBAR  . REPLACEMENT TOTAL KNEE Right   . SHOULDER SURGERY Right 2016 X 2  . SPINAL FUSION    . TONSILLECTOMY    .  UMBILICAL HERNIA REPAIR N/A 11/07/2019   Procedure: HERNIA REPAIR UMBILICAL ADULT;  Surgeon: [name], MD;  Location: [facility];  Service: General;  Laterality: N/A;  . WOUND EXPLORATION N/A 12/15/2019   Procedure: LUMBAR WOUND EXPLORATION;  Surgeon: [name], MD;  Location: [place];  Service: Neurosurgery;  Laterality: N/A;  LUMBAR WOUND EXPLORATION   Family History:  Family History  Problem Relation Age of Onset  . Cancer Father        sarcoma  . Cirrhosis Paternal Grandmother   . Cancer Paternal Grandfather        Pancreatic  . Fibromyalgia Sister   . Deafness Sister    Family Psychiatric  History: Denies Social History:  Social History   Substance and Sexual Activity  Alcohol Use No     Social History   Substance and Sexual Activity  Drug Use No    Social History   Socioeconomic History  . Marital status: Divorced    Spouse name: Not on file  . Number of children: Not on file  . Years of education: Not on file  . Highest education level: Not on file   Occupational History  . Occupation: delivery driver  Tobacco Use  . Smoking status: Current Every Day Smoker    Packs/day: 0.50    Years: 25.00    Pack years: 12.50    Types: Cigarettes  . Smokeless tobacco: Never Used  . Tobacco comment: will start Chantix soon.  Substance and Sexual Activity  . Alcohol use: No  . Drug use: No  . Sexual activity: Yes  Other Topics Concern  . Not on file  Social History Narrative   Patient currently lives in homeless shelter with 6 other men. This arrangement ends as of 03/25/2020.  Social Work has been aggressively working with patient regarding housing and financial issues. He will go to a hotel after surgery and has a friend named [name] that will stay with him. Unsure of where he will go after that day.   He has met with MD at [facility] pain clinic but will not receive any pain medicine from them until he sees a psychiatrist. This is scheduled for May.  He is currently out of pain medicine and has frequented the ER for help.   Dr. [name] will be able to order post op medicine since he has not yet signed a contract.   Social Determinants of Health   Financial Resource Strain:   . Difficulty of Paying Living Expenses:   Food Insecurity:   . Worried About Charity fundraiser in the Last Year:   . Arboriculturist in the Last Year:   Transportation Needs:   . Film/video editor (Medical):   Marland Kitchen Lack of Transportation (Non-Medical):   Physical Activity:   . Days of Exercise per Week:   . Minutes of Exercise per Session:   Stress:   . Feeling of Stress :   Social Connections:   . Frequency of Communication with Friends and Family:   . Frequency of Social Gatherings with Friends and Family:   . Attends Religious Services:   . Active Member of Clubs or Organizations:   . Attends Archivist Meetings:   Marland Kitchen Marital Status:    Additional Social History:    Allergies:   Allergies  Allergen Reactions  . Crab [Shellfish Allergy] Itching  and Swelling  . Flexeril [Cyclobenzaprine] Anaphylaxis  . Codeine Itching  . Latex Swelling    Gloves(when worn),  condoms  . Robaxin [Methocarbamol] Itching  . Sulfamethoxazole-Trimethoprim Nausea Only and Other (See Comments)    Stomach pain   . Tramadol Other (See Comments)    Gives headaches    Labs:  Results for orders placed or performed during the hospital encounter of 04/24/20 (from the past 48 hour(s))  Comprehensive metabolic panel     Status: Abnormal   Collection Time: 04/24/20  1:13 PM  Result Value Ref Range   Sodium 135 135 - 145 mmol/L   Potassium 3.6 3.5 - 5.1 mmol/L   Chloride 102 98 - 111 mmol/L   CO2 25 22 - 32 mmol/L   Glucose, Bld 103 (H) 70 - 99 mg/dL    Comment: Glucose reference range applies only to samples taken after fasting for at least 8 hours.   BUN 19 6 - 20 mg/dL   Creatinine, Ser 1.35 (H) 0.61 - 1.24 mg/dL   Calcium 8.4 (L) 8.9 - 10.3 mg/dL   Total Protein 7.8 6.5 - 8.1 g/dL   Albumin 4.2 3.5 - 5.0 g/dL   AST 45 (H) 15 - 41 U/L   ALT 19 0 - 44 U/L   Alkaline Phosphatase 121 38 - 126 U/L   Total Bilirubin 0.7 0.3 - 1.2 mg/dL   GFR calc non Af Amer >60 >60 mL/min   GFR calc Af Amer >60 >60 mL/min   Anion gap 8 5 - 15    Comment: Performed at [facility], [address] [place], [place] [address]  Ethanol     Status: None   Collection Time: 04/24/20  1:13 PM  Result Value Ref Range   Alcohol, Ethyl (B) <10 <10 mg/dL    Comment: (NOTE) Lowest detectable limit for serum alcohol is 10 mg/dL. For medical purposes only. Performed at [facility], [address] [place], [place] [address]   Salicylate level     Status: Abnormal   Collection Time: 04/24/20  1:13 PM  Result Value Ref Range   Salicylate Lvl <5.6 (L) 7.0 - 30.0 mg/dL    Comment: Performed at [facility], [address] [place], [place] [address]  Acetaminophen level     Status: Abnormal   Collection Time: 04/24/20  1:13 PM  Result Value Ref  Range   Acetaminophen (Tylenol), Serum <10 (L) 10 - 30 ug/mL    Comment: (NOTE) Therapeutic concentrations vary significantly. A range of 10-30 ug/mL  may be an effective concentration for many patients. However, some  are best treated at concentrations outside of this range. Acetaminophen concentrations >150 ug/mL at 4 hours after ingestion  and >50 ug/mL at 12 hours after ingestion are often associated with  toxic reactions. Performed at [facility], [address] [place], [place] [address]   cbc     Status: Abnormal   Collection Time: 04/24/20  1:13 PM  Result Value Ref Range   WBC 15.5 (H) 4.0 - 10.5 K/uL   RBC 4.86 4.22 - 5.81 MIL/uL   Hemoglobin 15.1 13.0 - 17.0 g/dL   HCT 43.5 39.0 - 52.0 %   MCV 89.5 80.0 - 100.0 fL   MCH 31.1 26.0 - 34.0 pg   MCHC 34.7 30.0 - 36.0 g/dL   RDW 13.5 11.5 - 15.5 %   Platelets 284 150 - 400 K/uL   nRBC 0.0 0.0 - 0.2 %    Comment: Performed at [facility], [address] [place], [place] [address]  Respiratory Panel by RT PCR (Flu  A&B, Covid) - Nasopharyngeal Swab     Status: None   Collection Time: 04/24/20  2:06 PM   Specimen: Nasopharyngeal Swab  Result Value Ref Range   SARS Coronavirus 2 by RT PCR NEGATIVE NEGATIVE    Comment: (NOTE) SARS-CoV-2 target nucleic acids are NOT DETECTED. The SARS-CoV-2 RNA is generally detectable in upper respiratoy specimens during the acute phase of infection. The lowest concentration of SARS-CoV-2 viral copies this assay can detect is 131 copies/mL. A negative result does not preclude SARS-Cov-2 infection and should not be used as the sole basis for treatment or other patient management decisions. A negative result may occur with  improper specimen collection/handling, submission of specimen other than nasopharyngeal swab, presence of viral mutation(s) within the areas targeted by this assay, and inadequate number of viral copies (<131 copies/mL). A negative result must be  combined with clinical observations, patient history, and epidemiological information. The expected result is Negative. Fact Sheet for Patients:  PinkCheek.be Fact Sheet for Healthcare Providers:  GravelBags.it This test is not yet ap proved or cleared by the [place] FDA and  has been authorized for detection and/or diagnosis of SARS-CoV-2 by FDA under an Emergency Use Authorization (EUA). This EUA will remain  in effect (meaning this test can be used) for the duration of the COVID-19 declaration under Section 564(b)(1) of the Act, 21 U.S.C. section 360bbb-3(b)(1), unless the authorization is terminated or revoked sooner.    Influenza A by PCR NEGATIVE NEGATIVE   Influenza B by PCR NEGATIVE NEGATIVE    Comment: (NOTE) The Xpert Xpress SARS-CoV-2/FLU/RSV assay is intended as an aid in  the diagnosis of influenza from Nasopharyngeal swab specimens and  should not be used as a sole basis for treatment. Nasal washings and  aspirates are unacceptable for Xpert Xpress SARS-CoV-2/FLU/RSV  testing. Fact Sheet for Patients: PinkCheek.be Fact Sheet for Healthcare Providers: GravelBags.it This test is not yet approved or cleared by the [place] FDA and  has been authorized for detection and/or diagnosis of SARS-CoV-2 by  FDA under an Emergency Use Authorization (EUA). This EUA will remain  in effect (meaning this test can be used) for the duration of the  Covid-19 declaration under Section 564(b)(1) of the Act, 21  U.S.C. section 360bbb-3(b)(1), unless the authorization is  terminated or revoked. Performed at [facility], [address] [place], [place] [address]   Urine Drug Screen, Qualitative     Status: Abnormal   Collection Time: 04/24/20  2:27 PM  Result Value Ref Range   Tricyclic, Ur Screen NONE DETECTED NONE DETECTED   Amphetamines, Ur Screen  NONE DETECTED NONE DETECTED   MDMA (Ecstasy)Ur Screen NONE DETECTED NONE DETECTED   Cocaine Metabolite,Ur [place] POSITIVE (A) NONE DETECTED   Opiate, Ur Screen NONE DETECTED NONE DETECTED   Phencyclidine (PCP) Ur S NONE DETECTED NONE DETECTED   Cannabinoid 50 Ng, Ur [place] NONE DETECTED NONE DETECTED   Barbiturates, Ur Screen NONE DETECTED NONE DETECTED   Benzodiazepine, Ur Scrn POSITIVE (A) NONE DETECTED   Methadone Scn, Ur NONE DETECTED NONE DETECTED    Comment: (NOTE) Tricyclics + metabolites, urine    Cutoff 1000 ng/mL Amphetamines + metabolites, urine  Cutoff 1000 ng/mL MDMA (Ecstasy), urine              Cutoff 500 ng/mL Cocaine Metabolite, urine          Cutoff 300 ng/mL Opiate + metabolites, urine        Cutoff 300 ng/mL Phencyclidine (PCP),  urine         Cutoff 25 ng/mL Cannabinoid, urine                 Cutoff 50 ng/mL Barbiturates + metabolites, urine  Cutoff 200 ng/mL Benzodiazepine, urine              Cutoff 200 ng/mL Methadone, urine                   Cutoff 300 ng/mL The urine drug screen provides only a preliminary, unconfirmed analytical test result and should not be used for non-medical purposes. Clinical consideration and professional judgment should be applied to any positive drug screen result due to possible interfering substances. A more specific alternate chemical method must be used in order to obtain a confirmed analytical result. Gas chromatography / mass spectrometry (GC/MS) is the preferred confirmat ory method. Performed at [facility], [address] [place], [place] [address]     Current Facility-Administered Medications  Medication Dose Route Frequency Provider Last Rate Last Admin  . hydrOXYzine (ATARAX/VISTARIL) tablet 25 mg  25 mg Oral Q4H PRN Delman Kitten, MD      . ibuprofen (ADVIL) tablet 800 mg  800 mg Oral Q8H PRN Delman Kitten, MD      . nicotine (NICODERM CQ - dosed in mg/24 hours) patch 14 mg  14 mg Transdermal Q0600 Delman Kitten, MD       . oxyCODONE (Oxy IR/ROXICODONE) immediate release tablet 5 mg  5 mg Oral Q6H PRN Delman Kitten, MD       Current Outpatient Medications  Medication Sig Dispense Refill  . acetaminophen (TYLENOL) 500 MG tablet Take 2 tablets (1,000 mg total) by mouth every 8 (eight) hours. 180 tablet 0  . diphenhydramine-acetaminophen (TYLENOL PM) 25-500 MG TABS tablet Take 2 tablets by mouth at bedtime as needed (sleep).     . gabapentin (NEURONTIN) 600 MG tablet Take 2 tablets (1,200 mg total) by mouth 3 (three) times daily. 180 tablet 6  . hydrOXYzine (VISTARIL) 25 MG capsule Take 25 mg by mouth every 4 (four) hours as needed for anxiety.    Marland Kitchen lithium carbonate (ESKALITH) 450 MG CR tablet Take 450 mg by mouth 2 (two) times daily.    Marland Kitchen OLANZapine (ZYPREXA) 10 MG tablet Take 10 mg by mouth at bedtime.    Marland Kitchen OLANZapine (ZYPREXA) 5 MG tablet Take 5 mg by mouth daily.    Marland Kitchen oxyCODONE (OXY IR/ROXICODONE) 5 MG immediate release tablet Take 1 tablet (5 mg total) by mouth every 6 (six) hours as needed for severe pain. 15 tablet 0  . tiZANidine (ZANAFLEX) 4 MG tablet Take 4 mg by mouth every 6 (six) hours as needed for muscle spasms.    . Triamcinolone Acetonide (GOODSENSE NASAL ALLERGY SPRAY NA) Place 1 spray into the nose daily as needed (allergies).      Musculoskeletal: Strength & Muscle Tone: within normal limits Gait & Station: normal Patient leans: N/A  Psychiatric Specialty Exam: Physical Exam  Review of Systems  Blood pressure 139/88, pulse 100, temperature 98.6 F (37 C), temperature source Oral, resp. rate 18, height 6' (1.829 m), weight 90.7 kg, SpO2 97 %.Body mass index is 27.12 kg/m.  General Appearance: Fairly Groomed  Eye Contact:  Good  Speech:  Clear and Coherent and Normal Rate  Volume:  Normal  Mood:  Depressed  Affect:  Blunt and Depressed  Thought Process:  Coherent, Linear and Descriptions of Associations: Circumstantial  Orientation:  Full (Time,  Place, and Person)  Thought Content:   Logical and denies any visual hallucinations at this time.  Suicidal Thoughts:  Yes.  with intent/plan  Homicidal Thoughts:  No  Memory:  Immediate;   Fair Recent;   Fair  Judgement:  Intact  Insight:  Present  Psychomotor Activity:  Normal  Concentration:  Concentration: Fair and Attention Span: Fair  Recall:  [employer] of Knowledge:  Fair  Language:  Fair  Akathisia:  No  Handed:  Right  AIMS (if indicated):     Assets:  Communication Skills Desire for Improvement Financial Resources/Insurance Resilience Transportation  ADL's:  Intact  Cognition:  WNL  Sleep:      The patient has a long history of drug use which has led to his current admission . His chronic pain has been complicated by surgeries and surgical infections. He recently had surgery yesterday for an abdominal hernia. The patient is receiving opioids which can be delivered on an as needed basis. He still endorses a lot of pain at this time, we are unlikely to deliver high amounts of pain medication in which he is seeking. Patient with a PMDP score of 500, and recent cocaine use.  He is trying to get established by pain management however he has been declined by numerous facilities due to his illicit drug use and or opiate addiction. He has an appointment with psychological evaluation through [facility] as part of his pain management work-up.  There is no clear evidence that he his depressed or actively suicidal or hallucinating, as noted above his thought processes are normal.    Treatment Plan Summary: Plan Will observe overnight with plan to discharge tomorrow. Patient with reported suicidal ideations with a plan, who is fixated on pain. He reports paranoia while under the influence of cocaine. Patient with multiple risk factors of suicidality, will oberve overnight even though he denies suicidal ideations at this time.   Disposition: Supportive therapy provided about ongoing stressors. Discussed crisis plan, support from  social network, calling 911, coming to the Emergency Department, and calling Suicide Hotline.  Suella Broad, FNP 04/24/2020 4:05 PM

## 2020-04-25 NOTE — ED Notes (Addendum)
Pt continues to complain of pain post  abdominal hernia surgery. Given pain medication with some relief. Abdomen distended, positive bowel sounds x4. Redness noted to surgical area, warm to touch. Area had been marked, no redness noted outside of marked area. Pt is on antibiotics at this time. Awaiting SW decision for pt.

## 2020-04-25 NOTE — Social Work (Signed)
TOC CM/SW consult received for assistance with SA and housing resources.  Assessment in progress.    Berenice Bouton, [name], LCSW  [phone] 8am-6pm (weekends)

## 2020-04-25 NOTE — ED Notes (Signed)
Report to include Situation, Background, Assessment, and Recommendations received from Dorothy RN. Patient alert and oriented, warm and dry, in no acute distress. Patient denies SI, HI, AVH and pain. Patient made aware of Q15 minute rounds and security cameras for their safety. Patient instructed to come to me with needs or concerns.  

## 2020-04-25 NOTE — ED Notes (Signed)
Patient c/o anxiety. 

## 2020-04-25 NOTE — ED Notes (Signed)
Pt. Was given soda

## 2020-04-25 NOTE — ED Notes (Signed)
Hourly rounding reveals patient awake in room. No complaints, stable, in no acute distress. Q15 minute rounds and monitoring via Security Cameras to continue. 
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
## 2020-04-25 NOTE — ED Notes (Signed)
Pt given meal tray.

## 2020-04-25 NOTE — ED Notes (Signed)
Hourly rounding reveals patient sleeping in room. No complaints, stable, in no acute distress. Q15 minute rounds and monitoring via Security Cameras to continue. 

## 2020-04-25 NOTE — ED Notes (Signed)
Pt. Was given his dinner tray and grape juice to drink.

## 2020-04-25 NOTE — TOC Initial Note (Signed)
Transition of Care (TOC) - Initial/Assessment Note    Patient Details  Name: Johnny [name]. MRN: [id number] Date of Birth: Mar 11, 1971  Transition of Care [facility]) CM/SW Contact:    Berenice Bouton, LCSW Phone Number: 04/25/2020, 5:10 PM  Clinical Narrative:       Mr. Johnny [name] is a 49 years old Caucasian male who reported that he is homeless.  He stated that he has been living out of his truck/van.  Patient noted that he is homeless and does not have anyone that could offer him a (bed/couch) place to stay while he heals from surgery 2 days ago.   Patient reported that he would like assistance w/placement for inpatient mental health/substance abuse rehabilitation.  Social worker explained to the patient that he was cleared with [facility] behavior health team (TTS).  Patient demonstrated interest in [facility] and or [facility]  Action: [facility] admissions paper work faxed to [facility] admissions office.  Provided patient with [place] homeless and food Materials engineer  Plan: Discharged to [place], [place]  pending               Expected Discharge Plan: Homeless Shelter(Pt would like to return to [facility]) Barriers to Discharge: Homeless with medical needs(Patient had surgery two days ago)   Patient Goals and CMS Choice Patient states their goals for this hospitalization and ongoing recovery are:: Patient reported that "my mental health is not good." Wants to go to a MH/SA rehabilitation facility. [place].      Expected Discharge Plan and Services Expected Discharge Plan: Homeless Shelter(Pt would like to return to [facility]) In-house Referral: Clinical Social Work      Prior Living Arrangements/Services   Lives with:: Self(Patient reported that he is homeless.  Living out of a van/truck) Patient language and need for interpreter reviewed:: No        Need for Family Participation in Patient Care: No (Comment) Care giver support system in  place?: Yes (comment)   Criminal Activity/Legal Involvement Pertinent to Current Situation/Hospitalization: No - Comment as needed  Activities of Daily Living Home Assistive Devices/Equipment: None ADL Screening (condition at time of admission) Patient's cognitive ability adequate to safely complete daily activities?: Yes Is the patient deaf or have difficulty hearing?: No Does the patient have difficulty seeing, even when wearing glasses/contacts?: No Does the patient have difficulty concentrating, remembering, or making decisions?: No Patient able to express need for assistance with ADLs?: Yes Does the patient have difficulty dressing or bathing?: No Independently performs ADLs?: Yes (appropriate for developmental age) Does the patient have difficulty walking or climbing stairs?: No Weakness of Legs: None Weakness of Arms/Hands: None  Permission Sought/Granted Permission sought to share information with : Case Manager, Chartered certified accountant granted to share information with : Yes, Verbal Permission Granted     Permission granted to share info w AGENCY: [facility]        Emotional Assessment Appearance:: Appears stated age Attitude/Demeanor/Rapport: Engaged Affect (typically observed): Accepting Orientation: : Oriented to Self, Oriented to Place, Oriented to  Time, Oriented to Situation Alcohol / Substance Use: Tobacco Use, Alcohol Use, Illicit Drugs Psych Involvement: Yes (comment)  Admission diagnosis:  VC Patient Active Problem List   Diagnosis Date Noted  . Recurrent ventral hernia   . Direct left inguinal hernia   . Wound dehiscence 12/19/2019  . Wound infection after surgery 12/06/2019  . CSF leak 11/28/2019  . Postoperative CSF leak 11/28/2019  . HNP (  herniated nucleus pulposus), lumbar 11/18/2019  . Umbilical hernia without obstruction and without gangrene 10/23/2019  . Osteoarthritis of spine with radiculopathy, lumbar region 10/09/2019  .  Chronic bilateral low back pain with bilateral sciatica 10/07/2019  . S/P cervical spinal fusion (C5-C6 ACDF) 07/15/2019  . Cervical radicular pain 07/15/2019  . Cervical facet joint syndrome 07/15/2019  . Degeneration of lumbar intervertebral disc 07/03/2019  . Lumbar radicular pain 07/03/2019  . Cervical spondylosis 04/17/2019  . Homeless single person 04/17/2019  . History of total knee replacement, right 04/25/2018  . Pancreatic mass 01/14/2018  . Tobacco abuse 01/14/2018  . Chronic neck pain   . Benign neoplasm of descending colon   . Benign neoplasm of ascending colon   . Intractable vomiting with nausea   . Reflux esophagitis   . Duodenal ulcer without hemorrhage or perforation   . Chronic pain syndrome 08/07/2017  . Vitamin D deficiency 06/20/2017  . Insomnia 06/19/2017  . Right sided sciatica   . Presence of artificial knee joint 01/17/2013  . Knee pain 01/17/2013  . 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 [place] [address] Phone: [phone] Fax: [phone]   Social Determinants of Health (SDOH) Interventions    Readmission Risk Interventions No flowsheet data found.

## 2020-04-25 NOTE — ED Notes (Signed)
SW in to talk with pt at this time

## 2020-04-25 NOTE — Consult Note (Addendum)
[facility] Consult   Reason for Consult:  Paranoia Referring Physician:  Dr. Jacqualine Code Patient Identification: Johnny [name]. MRN:  [id number] Principal Diagnosis: <principal problem not specified> Diagnosis:  Active Problems:   * No active hospital problems. *   Total Time spent with patient: 45 minutes  Subjective:   Johnny Bolder Tylee Newby. is a 49 y.o. male patient admitted with complicated medical history involving numerous surgeries, chronic back/neck/knee pain, depression, Bipolar, and chronic opiate use. Today during his evaluation he is alert and oriented, and appears to be resting well. He was seen at this nurses station requesting his medication and then ambulated back to his room to speak with Probation officer. Today he is less irritable, appears calm and appropriate. He continues to want inpatient admission or long term housing. As noted he is homeless and has declined boarding houses, shelters, and hotels. "Im not paying $450 a month to sleep with roaches and be around people that use drugs. The shelter sent me to the boarding house. And the people at the hotel keep trying to get me." He reports some ongoing paranoia that he describes as people out to get him. " I seen people peeking through that window, and I thought they were trying to take the door off the hinges at the hotel. " He continues to have normal thought processes that are relevant and does not appear to be responding to internal stimuli or preoccupied. He denies suicidal ideation, homicidal ideation and or hallucinations.    HPI:  Johnny Kromer. is a 49 y.o. male here for evaluation for suicidal thoughts  Patient reports that he is had for several days now paranoia.  Point yesterday drove around [facility] stopped on [address], and felt like a tractor trailer and 4 cars were there just a plan to block him in to come on to him down  Reports the symptoms have been present and worsening over the  last several months after the death of a family member and also a previous back surgery that required several revisions  He had surgery yesterday, and umbilical surgery he still having pain discomfort from that was seen in the ER earlier today for same.  He decided he better check back in given the paranoid symptoms he has been experiencing recently, reports they did not start immediately yesterday but he has been experiencing them now for several weeks including some suicidal thoughts at times think he might just take his [name] and driving into a tree  Denies overdose or ingestion  Reports previous admission for psychiatric care in the past  Assessment: 49 year old male who presents to the ER endorsing paranoia, suicidal ideations and anger. Today during the evaluation he is alert and oriented. He is observed to be resting well in his bed, and acknoweledges psych team by turning his volume down on the TV upon entry into the room. Discussed with patient social work consult and plans to discharge him from the hospital. He inquires about inpatient admission or long term rehab. Writer discussed with patient that he does not meet inpatient criteria. He was recently [facility] after a 10 day length of stay per patient, and medication management. Per chart review he has been working closely with [facility] team to find housing however he has declined them citing unsanitary conditions and toxic environment. This was reviewed with him and states "they not really helping me I need long term rehab and to get away  from [place]." At this time patient has normal thought processes, he does not appear to be responding to internal stimuli.  So at this point in time he is predominantly here for housing purposes by his own admission he denies current suicidal thoughts plans or intent. At this time he denies homicidal ideations, and or hallucinations.  This patient has chronic suicidal ideations related to  his chronic pain, chronic substance abuse and at this time he does express  interest in quitting his drug use and is seeking comfort in the ER for  secondary gain and housing due to being homeless.   Past Psychiatric History:Depression and Bipolar. No reported suicide attempts. Currently taking Lithium and Vistaril. He has not followed up with outpatient since his most recent admission to [facility] due to surgery.   Risk to Self: Suicidal Ideation: No-Not Currently/Within Last 6 Months Suicidal Intent: No Is patient at risk for suicide?: No, but patient needs Medical Clearance Suicidal Plan?: No-Not Currently/Within Last 6 Months Specify Current Suicidal Plan: Overdose on drugs  Access to Means: Yes(Not currently ) Specify Access to Suicidal Means: drugs  What has been your use of drugs/alcohol within the last 12 months?: Cocaine, Marijuana and alcohol  How many times?: 1 Other Self Harm Risks: None reported  Triggers for Past Attempts: Other (Comment)(family conflict ) Intentional Self Injurious Behavior: None Yes Risk to Others: Homicidal Ideation: No Thoughts of Harm to Others: No Current Homicidal Intent: No Current Homicidal Plan: No Access to Homicidal Means: No Identified Victim: n/a History of harm to others?: No(None reported ) Assessment of Violence: None Noted Violent Behavior Description: n/a Does patient have access to weapons?: No Criminal Charges Pending?: Yes Describe Pending Criminal Charges: traffic violations Does patient have a court date: Yes Court Date: (Reported to be unaware ) No Prior Inpatient Therapy: Prior Inpatient Therapy: Yes Prior Therapy Dates: 03/2020 Prior Therapy Facilty/Provider(s): [facility] Reason for Treatment: [facility] x1 03/2020 Prior Outpatient Therapy: Prior Outpatient Therapy: No Does patient have an ACCT team?: No Does patient have Intensive In-House Services?  : No Does patient have [facility] services? : No Does patient have P4CC  services?: NoNone at this time  Past Medical History:  Past Medical History:  Diagnosis Date  . Arthritis    hands, knees, lower back  . Bilateral sciatica   . Carpal tunnel syndrome   . Chronic back pain   . Chronic neck pain   . Chronic pain 03/12/2020   has been connected to pain management clinics. currently waiting to see psychiatrist before he can join [facility]  . Chronic pain syndrome   . GERD (gastroesophageal reflux disease)   . Hand joint pain 07/02/2013  . Herniated disc   . Lives in homeless shelter 02/2020   housing ends as of 03/25/20. going to hotel after surgery  . Traumatic amputation of left index finger 2013  . Wears dentures    full upper and lower    Past Surgical History:  Procedure Laterality Date  . AMPUTATION FINGER / THUMB Left 2013  . COLONOSCOPY WITH PROPOFOL N/A 01/03/2018   Procedure: COLONOSCOPY WITH PROPOFOL;  Surgeon: [name], MD;  Location: [place];  Service: Endoscopy;  Laterality: N/A;  . ESOPHAGOGASTRODUODENOSCOPY (EGD) WITH PROPOFOL N/A 01/03/2018   Procedure: ESOPHAGOGASTRODUODENOSCOPY (EGD) WITH PROPOFOL;  Surgeon: [name], MD;  Location: [place];  Service: Endoscopy;  Laterality: N/A;  . HERNIA REPAIR Right    right inguinal hernia repaired twice  .  HIP SURGERY  2006   took bone out of hip to put into neck  . JOINT REPLACEMENT Right    knee  . KNEE SURGERY Right   . LUMBAR LAMINECTOMY/DECOMPRESSION MICRODISCECTOMY N/A 11/18/2019   Procedure: LUMBAR FOUR-FIVE LUMBAR LAMINECTOMY/DECOMPRESSION MICRODISCECTOMY;  Surgeon: [name], MD;  Location: [place];  Service: Neurosurgery;  Laterality: N/A;  . LUMBAR WOUND DEBRIDEMENT N/A 12/06/2019   Procedure: LUMBAR WOUND REVISION;  Surgeon: [name], MD;  Location: [place];  Service: Neurosurgery;  Laterality: N/A;  . neck fusion    . POLYPECTOMY N/A 01/03/2018   Procedure: POLYPECTOMY;  Surgeon: [name], MD;  Location: [place];   Service: Endoscopy;  Laterality: N/A;  . REPAIR OF CEREBROSPINAL FLUID LEAK N/A 11/28/2019   Procedure: REPAIR OF CEREBROSPINAL FLUID LEAK/LUMBAR;  Surgeon: [name], MD;  Location: [place];  Service: Neurosurgery;  Laterality: N/A;  REPAIR OF CEREBROSPINAL FLUID LEAK/LUMBAR  . REPLACEMENT TOTAL KNEE Right   . SHOULDER SURGERY Right 2016 X 2  . SPINAL FUSION    . TONSILLECTOMY    . UMBILICAL HERNIA REPAIR N/A 11/07/2019   Procedure: HERNIA REPAIR UMBILICAL ADULT;  Surgeon: [name], MD;  Location: [facility];  Service: General;  Laterality: N/A;  . WOUND EXPLORATION N/A 12/15/2019   Procedure: LUMBAR WOUND EXPLORATION;  Surgeon: [name], MD;  Location: [place];  Service: Neurosurgery;  Laterality: N/A;  LUMBAR WOUND EXPLORATION   Family History:  Family History  Problem Relation Age of Onset  . Cancer Father        sarcoma  . Cirrhosis Paternal Grandmother   . Cancer Paternal Grandfather        Pancreatic  . Fibromyalgia Sister   . Deafness Sister    Family Psychiatric  History: Denies Social History:  Social History   Substance and Sexual Activity  Alcohol Use No     Social History   Substance and Sexual Activity  Drug Use No    Social History   Socioeconomic History  . Marital status: Divorced    Spouse name: Not on file  . Number of children: Not on file  . Years of education: Not on file  . Highest education level: Not on file  Occupational History  . Occupation: delivery driver  Tobacco Use  . Smoking status: Current Every Day Smoker    Packs/day: 0.50    Years: 25.00    Pack years: 12.50    Types: Cigarettes  . Smokeless tobacco: Never Used  . Tobacco comment: will start Chantix soon.  Substance and Sexual Activity  . Alcohol use: No  . Drug use: No  . Sexual activity: Yes  Other Topics Concern  . Not on file  Social History Narrative   Patient currently lives in homeless shelter with 6 other men. This arrangement ends as of 03/25/2020.   Social Work has been aggressively working with patient regarding housing and financial issues. He will go to a hotel after surgery and has a friend named [name] that will stay with him. Unsure of where he will go after that day.   He has met with MD at [facility] pain clinic but will not receive any pain medicine from them until he sees a psychiatrist. This is scheduled for May.  He is currently out of pain medicine and has frequented the ER for help.   Dr. [name] will be able to order post op medicine since he has not yet signed a contract.   Social Determinants  of Health   Financial Resource Strain:   . Difficulty of Paying Living Expenses:   Food Insecurity:   . Worried About Charity fundraiser in the Last Year:   . Arboriculturist in the Last Year:   Transportation Needs:   . Film/video editor (Medical):   Marland Kitchen Lack of Transportation (Non-Medical):   Physical Activity:   . Days of Exercise per Week:   . Minutes of Exercise per Session:   Stress:   . Feeling of Stress :   Social Connections:   . Frequency of Communication with Friends and Family:   . Frequency of Social Gatherings with Friends and Family:   . Attends Religious Services:   . Active Member of Clubs or Organizations:   . Attends Archivist Meetings:   Marland Kitchen Marital Status:    Additional Social History:    Allergies:   Allergies  Allergen Reactions  . Crab [Shellfish Allergy] Itching and Swelling  . Flexeril [Cyclobenzaprine] Anaphylaxis  . Codeine Itching  . Latex Swelling    Gloves(when worn), condoms  . Robaxin [Methocarbamol] Itching  . Sulfamethoxazole-Trimethoprim Nausea Only and Other (See Comments)    Stomach pain   . Tramadol Other (See Comments)    Gives headaches    Labs:  Results for orders placed or performed during the hospital encounter of 04/24/20 (from the past 48 hour(s))  Comprehensive metabolic panel     Status: Abnormal   Collection Time: 04/24/20  1:13 PM  Result Value Ref  Range   Sodium 135 135 - 145 mmol/L   Potassium 3.6 3.5 - 5.1 mmol/L   Chloride 102 98 - 111 mmol/L   CO2 25 22 - 32 mmol/L   Glucose, Bld 103 (H) 70 - 99 mg/dL    Comment: Glucose reference range applies only to samples taken after fasting for at least 8 hours.   BUN 19 6 - 20 mg/dL   Creatinine, Ser 1.35 (H) 0.61 - 1.24 mg/dL   Calcium 8.4 (L) 8.9 - 10.3 mg/dL   Total Protein 7.8 6.5 - 8.1 g/dL   Albumin 4.2 3.5 - 5.0 g/dL   AST 45 (H) 15 - 41 U/L   ALT 19 0 - 44 U/L   Alkaline Phosphatase 121 38 - 126 U/L   Total Bilirubin 0.7 0.3 - 1.2 mg/dL   GFR calc non Af Amer >60 >60 mL/min   GFR calc Af Amer >60 >60 mL/min   Anion gap 8 5 - 15    Comment: Performed at [facility], [address] [place], [place] [address]  Ethanol     Status: None   Collection Time: 04/24/20  1:13 PM  Result Value Ref Range   Alcohol, Ethyl (B) <10 <10 mg/dL    Comment: (NOTE) Lowest detectable limit for serum alcohol is 10 mg/dL. For medical purposes only. Performed at [facility], [address] [place], [place] [address]   Salicylate level     Status: Abnormal   Collection Time: 04/24/20  1:13 PM  Result Value Ref Range   Salicylate Lvl <7.5 (L) 7.0 - 30.0 mg/dL    Comment: Performed at [facility], [address] [place], [place] [address]  Acetaminophen level     Status: Abnormal   Collection Time: 04/24/20  1:13 PM  Result Value Ref Range   Acetaminophen (Tylenol), Serum <10 (L) 10 - 30 ug/mL    Comment: (NOTE) Therapeutic concentrations vary significantly. A range of  10-30 ug/mL  may be an effective concentration for many patients. However, some  are best treated at concentrations outside of this range. Acetaminophen concentrations >150 ug/mL at 4 hours after ingestion  and >50 ug/mL at 12 hours after ingestion are often associated with  toxic reactions. Performed at [facility], [address] [place], [place] [address]   cbc      Status: Abnormal   Collection Time: 04/24/20  1:13 PM  Result Value Ref Range   WBC 15.5 (H) 4.0 - 10.5 K/uL   RBC 4.86 4.22 - 5.81 MIL/uL   Hemoglobin 15.1 13.0 - 17.0 g/dL   HCT 43.5 39.0 - 52.0 %   MCV 89.5 80.0 - 100.0 fL   MCH 31.1 26.0 - 34.0 pg   MCHC 34.7 30.0 - 36.0 g/dL   RDW 13.5 11.5 - 15.5 %   Platelets 284 150 - 400 K/uL   nRBC 0.0 0.0 - 0.2 %    Comment: Performed at [facility], [address] [place], [place] [address]  Respiratory Panel by RT PCR (Flu A&B, Covid) - Nasopharyngeal Swab     Status: None   Collection Time: 04/24/20  2:06 PM   Specimen: Nasopharyngeal Swab  Result Value Ref Range   SARS Coronavirus 2 by RT PCR NEGATIVE NEGATIVE    Comment: (NOTE) SARS-CoV-2 target nucleic acids are NOT DETECTED. The SARS-CoV-2 RNA is generally detectable in upper respiratoy specimens during the acute phase of infection. The lowest concentration of SARS-CoV-2 viral copies this assay can detect is 131 copies/mL. A negative result does not preclude SARS-Cov-2 infection and should not be used as the sole basis for treatment or other patient management decisions. A negative result may occur with  improper specimen collection/handling, submission of specimen other than nasopharyngeal swab, presence of viral mutation(s) within the areas targeted by this assay, and inadequate number of viral copies (<131 copies/mL). A negative result must be combined with clinical observations, patient history, and epidemiological information. The expected result is Negative. Fact Sheet for Patients:  PinkCheek.be Fact Sheet for Healthcare Providers:  GravelBags.it This test is not yet ap proved or cleared by the [place] FDA and  has been authorized for detection and/or diagnosis of SARS-CoV-2 by FDA under an Emergency Use Authorization (EUA). This EUA will remain  in effect (meaning this test can be used) for the  duration of the COVID-19 declaration under Section 564(b)(1) of the Act, 21 U.S.C. section 360bbb-3(b)(1), unless the authorization is terminated or revoked sooner.    Influenza A by PCR NEGATIVE NEGATIVE   Influenza B by PCR NEGATIVE NEGATIVE    Comment: (NOTE) The Xpert Xpress SARS-CoV-2/FLU/RSV assay is intended as an aid in  the diagnosis of influenza from Nasopharyngeal swab specimens and  should not be used as a sole basis for treatment. Nasal washings and  aspirates are unacceptable for Xpert Xpress SARS-CoV-2/FLU/RSV  testing. Fact Sheet for Patients: PinkCheek.be Fact Sheet for Healthcare Providers: GravelBags.it This test is not yet approved or cleared by the [place] FDA and  has been authorized for detection and/or diagnosis of SARS-CoV-2 by  FDA under an Emergency Use Authorization (EUA). This EUA will remain  in effect (meaning this test can be used) for the duration of the  Covid-19 declaration under Section 564(b)(1) of the Act, 21  U.S.C. section 360bbb-3(b)(1), unless the authorization is  terminated or revoked. Performed at [facility], [address], [place], [place] [address]   Urine Drug Screen,  Qualitative     Status: Abnormal   Collection Time: 04/24/20  2:27 PM  Result Value Ref Range   Tricyclic, Ur Screen NONE DETECTED NONE DETECTED   Amphetamines, Ur Screen NONE DETECTED NONE DETECTED   MDMA (Ecstasy)Ur Screen NONE DETECTED NONE DETECTED   Cocaine Metabolite,Ur [place] POSITIVE (A) NONE DETECTED   Opiate, Ur Screen NONE DETECTED NONE DETECTED   Phencyclidine (PCP) Ur S NONE DETECTED NONE DETECTED   Cannabinoid 50 Ng, Ur [place] NONE DETECTED NONE DETECTED   Barbiturates, Ur Screen NONE DETECTED NONE DETECTED   Benzodiazepine, Ur Scrn POSITIVE (A) NONE DETECTED   Methadone Scn, Ur NONE DETECTED NONE DETECTED    Comment: (NOTE) Tricyclics + metabolites, urine    Cutoff 1000  ng/mL Amphetamines + metabolites, urine  Cutoff 1000 ng/mL MDMA (Ecstasy), urine              Cutoff 500 ng/mL Cocaine Metabolite, urine          Cutoff 300 ng/mL Opiate + metabolites, urine        Cutoff 300 ng/mL Phencyclidine (PCP), urine         Cutoff 25 ng/mL Cannabinoid, urine                 Cutoff 50 ng/mL Barbiturates + metabolites, urine  Cutoff 200 ng/mL Benzodiazepine, urine              Cutoff 200 ng/mL Methadone, urine                   Cutoff 300 ng/mL The urine drug screen provides only a preliminary, unconfirmed analytical test result and should not be used for non-medical purposes. Clinical consideration and professional judgment should be applied to any positive drug screen result due to possible interfering substances. A more specific alternate chemical method must be used in order to obtain a confirmed analytical result. Gas chromatography / mass spectrometry (GC/MS) is the preferred confirmat ory method. Performed at [name], [address], [place], [place] [address]     Current Facility-Administered Medications  Medication Dose Route Frequency Provider Last Rate Last Admin  . cephALEXin (KEFLEX) capsule 500 mg  500 mg Oral Q8H Carrie Mew, MD   500 mg at 04/25/20 1013  . gabapentin (NEURONTIN) tablet 1,200 mg  1,200 mg Oral TID Carrie Mew, MD   1,200 mg at 04/25/20 1013  . hydrOXYzine (ATARAX/VISTARIL) tablet 25 mg  25 mg Oral Q4H PRN Delman Kitten, MD   25 mg at 04/24/20 2135  . ibuprofen (ADVIL) tablet 800 mg  800 mg Oral Q8H PRN Delman Kitten, MD   800 mg at 04/25/20 0518  . nicotine (NICODERM CQ - dosed in mg/24 hours) patch 14 mg  14 mg Transdermal Q0600 Delman Kitten, MD   14 mg at 04/25/20 1013  . oxyCODONE (Oxy IR/ROXICODONE) immediate release tablet 5 mg  5 mg Oral Q6H PRN Delman Kitten, MD   5 mg at 04/25/20 1157  . sulfamethoxazole-trimethoprim (BACTRIM DS) 800-160 MG per tablet 1 tablet  1 tablet Oral Q12H Carrie Mew, MD    1 tablet at 04/25/20 1013  . tiZANidine (ZANAFLEX) tablet 4 mg  4 mg Oral Q6H PRN Carrie Mew, MD       Current Outpatient Medications  Medication Sig Dispense Refill  . acetaminophen (TYLENOL) 500 MG tablet Take 2 tablets (1,000 mg total) by mouth every 8 (eight) hours. 180 tablet 0  . diphenhydramine-acetaminophen (TYLENOL PM) 25-500 MG TABS tablet Take 2  tablets by mouth at bedtime as needed (sleep).     . gabapentin (NEURONTIN) 600 MG tablet Take 2 tablets (1,200 mg total) by mouth 3 (three) times daily. 180 tablet 6  . hydrOXYzine (VISTARIL) 25 MG capsule Take 25 mg by mouth every 4 (four) hours as needed for anxiety.    Marland Kitchen lithium carbonate (ESKALITH) 450 MG CR tablet Take 450 mg by mouth 2 (two) times daily.    Marland Kitchen OLANZapine (ZYPREXA) 10 MG tablet Take 10 mg by mouth at bedtime.    Marland Kitchen OLANZapine (ZYPREXA) 5 MG tablet Take 5 mg by mouth daily.    Marland Kitchen oxyCODONE (OXY IR/ROXICODONE) 5 MG immediate release tablet Take 1 tablet (5 mg total) by mouth every 6 (six) hours as needed for severe pain. 15 tablet 0  . tiZANidine (ZANAFLEX) 4 MG tablet Take 4 mg by mouth every 6 (six) hours as needed for muscle spasms.    . Triamcinolone Acetonide (GOODSENSE NASAL ALLERGY SPRAY NA) Place 1 spray into the nose daily as needed (allergies).      Musculoskeletal: Strength & Muscle Tone: within normal limits Gait & Station: normal Patient leans: N/A  Psychiatric Specialty Exam: Physical Exam   Review of Systems   Blood pressure 134/84, pulse 92, temperature 97.8 F (36.6 C), temperature source Oral, resp. rate 17, height 6' (1.829 m), weight 90.7 kg, SpO2 95 %.Body mass index is 27.12 kg/m.  General Appearance: Fairly Groomed  Eye Contact:  Good  Speech:  Clear and Coherent and Normal Rate  Volume:  Normal  Mood:  Anxious  Affect:  Appropriate and Congruent  Thought Process:  Coherent, Goal Directed, Linear and Descriptions of Associations: Intact  Orientation:  Full (Time, Place, and  Person)  Thought Content:  Logical  Suicidal Thoughts:  No  Homicidal Thoughts:  No  Memory:  Immediate;   Fair Recent;   Fair  Judgement:  Intact  Insight:  Present  Psychomotor Activity:  Normal  Concentration:  Concentration: Fair and Attention Span: Fair  Recall:  [employer] of Knowledge:  Fair  Language:  Fair  Akathisia:  No  Handed:  Right  AIMS (if indicated):     Assets:  Communication Skills Desire for Improvement Financial Resources/Insurance Resilience Transportation  ADL's:  Intact  Cognition:  WNL  Sleep:      The patient has a long history of drug use which has led to his current admission . His chronic pain has been complicated by surgeries and surgical infections. He recently had surgery 04/23/20 for an abdominal hernia repair. Patient with a PMDP score of 500, and recent cocaine use.  He is trying to get established by pain management however he has been declined by numerous facilities due to his illicit drug use and or opiate addiction. He has an appointment with psychological evaluation through [facility] as part of his pain management work-up.  There is no clear evidence that he his depressed or actively suicidal or hallucinating, as noted above his thought processes are normal.   Treatment Plan Summary: Plan Will psych clear at this time. Patient currently denies suicidal ideations and homicidal ideations. He reports his biggest factor is housing and he does not want to be released from the hospital without somewhere to go. Per patient I want to go somewhere far away from [place]. There is nothing here but drugs alot of drugs. ' SW consult placed yesterday for housing and substance abuse. He remains interested in [facility] and [facility]. Patient says suicidal thoughts  are dependent on if he can get into a long term rehab such as [facility] or [facility], however he can contract for safety at this time.   Disposition: No evidence of imminent risk to self or others at present.   Patient  does not meet criteria for psychiatric inpatient admission. Supportive therapy provided about ongoing stressors. Discussed crisis plan, support from social network, calling 911, coming to the Emergency Department, and calling Suicide Hotline.  Suella Broad, FNP 04/25/2020 12:30 PM

## 2020-04-26 ENCOUNTER — Encounter: Payer: Self-pay | Admitting: Internal Medicine

## 2020-04-26 ENCOUNTER — Telehealth: Payer: Medicaid Other

## 2020-04-26 DIAGNOSIS — F1120 Opioid dependence, uncomplicated: Secondary | ICD-10-CM | POA: Diagnosis present

## 2020-04-26 DIAGNOSIS — Z20822 Contact with and (suspected) exposure to covid-19: Secondary | ICD-10-CM | POA: Diagnosis present

## 2020-04-26 DIAGNOSIS — G5600 Carpal tunnel syndrome, unspecified upper limb: Secondary | ICD-10-CM | POA: Diagnosis present

## 2020-04-26 DIAGNOSIS — Z590 Homelessness: Secondary | ICD-10-CM | POA: Diagnosis not present

## 2020-04-26 DIAGNOSIS — Z91013 Allergy to seafood: Secondary | ICD-10-CM | POA: Diagnosis not present

## 2020-04-26 DIAGNOSIS — T8149XA Infection following a procedure, other surgical site, initial encounter: Secondary | ICD-10-CM | POA: Diagnosis not present

## 2020-04-26 DIAGNOSIS — Z880 Allergy status to penicillin: Secondary | ICD-10-CM | POA: Diagnosis not present

## 2020-04-26 DIAGNOSIS — T8141XA Infection following a procedure, superficial incisional surgical site, initial encounter: Secondary | ICD-10-CM | POA: Diagnosis not present

## 2020-04-26 DIAGNOSIS — F17210 Nicotine dependence, cigarettes, uncomplicated: Secondary | ICD-10-CM | POA: Diagnosis present

## 2020-04-26 DIAGNOSIS — Z885 Allergy status to narcotic agent status: Secondary | ICD-10-CM | POA: Diagnosis not present

## 2020-04-26 DIAGNOSIS — Z981 Arthrodesis status: Secondary | ICD-10-CM | POA: Diagnosis not present

## 2020-04-26 DIAGNOSIS — Z79899 Other long term (current) drug therapy: Secondary | ICD-10-CM | POA: Diagnosis not present

## 2020-04-26 DIAGNOSIS — Z639 Problem related to primary support group, unspecified: Secondary | ICD-10-CM | POA: Diagnosis not present

## 2020-04-26 DIAGNOSIS — L03311 Cellulitis of abdominal wall: Secondary | ICD-10-CM

## 2020-04-26 DIAGNOSIS — M549 Dorsalgia, unspecified: Secondary | ICD-10-CM | POA: Diagnosis present

## 2020-04-26 DIAGNOSIS — F315 Bipolar disorder, current episode depressed, severe, with psychotic features: Secondary | ICD-10-CM | POA: Diagnosis present

## 2020-04-26 DIAGNOSIS — Z888 Allergy status to other drugs, medicaments and biological substances status: Secondary | ICD-10-CM | POA: Diagnosis not present

## 2020-04-26 DIAGNOSIS — Z91048 Other nonmedicinal substance allergy status: Secondary | ICD-10-CM | POA: Diagnosis not present

## 2020-04-26 DIAGNOSIS — Z882 Allergy status to sulfonamides status: Secondary | ICD-10-CM | POA: Diagnosis not present

## 2020-04-26 DIAGNOSIS — Z89022 Acquired absence of left finger(s): Secondary | ICD-10-CM | POA: Diagnosis not present

## 2020-04-26 DIAGNOSIS — R45851 Suicidal ideations: Secondary | ICD-10-CM | POA: Diagnosis present

## 2020-04-26 DIAGNOSIS — M961 Postlaminectomy syndrome, not elsewhere classified: Secondary | ICD-10-CM | POA: Diagnosis present

## 2020-04-26 DIAGNOSIS — Z91040 Latex allergy status: Secondary | ICD-10-CM | POA: Diagnosis not present

## 2020-04-26 DIAGNOSIS — K219 Gastro-esophageal reflux disease without esophagitis: Secondary | ICD-10-CM | POA: Diagnosis present

## 2020-04-26 DIAGNOSIS — G894 Chronic pain syndrome: Secondary | ICD-10-CM | POA: Diagnosis present

## 2020-04-26 LAB — CBC
HCT: 47.4 % (ref 39.0–52.0)
Hemoglobin: 15.5 g/dL (ref 13.0–17.0)
MCH: 30.6 pg (ref 26.0–34.0)
MCHC: 32.7 g/dL (ref 30.0–36.0)
MCV: 93.5 fL (ref 80.0–100.0)
Platelets: 272 10*3/uL (ref 150–400)
RBC: 5.07 MIL/uL (ref 4.22–5.81)
RDW: 14.4 % (ref 11.5–15.5)
WBC: 14.4 10*3/uL — ABNORMAL HIGH (ref 4.0–10.5)
nRBC: 0 % (ref 0.0–0.2)

## 2020-04-26 LAB — CREATININE, SERUM
Creatinine, Ser: 1.11 mg/dL (ref 0.61–1.24)
GFR calc Af Amer: >60 mL/min (ref >60)
GFR calc non Af Amer: >60 mL/min (ref >60)

## 2020-04-26 MED ORDER — ACETAMINOPHEN 650 MG RE SUPP: 650.0000 mg | Freq: Four times a day (QID) | RECTAL | Status: DC | PRN

## 2020-04-26 MED ORDER — OLANZAPINE 5 MG PO TABS: 5.0000 mg | ORAL_TABLET | Freq: Two times a day (BID) | ORAL | Status: DC

## 2020-04-26 MED ORDER — ENOXAPARIN SODIUM 40 MG/0.4ML ~~LOC~~ SOLN
40.0000 mg | SUBCUTANEOUS | Status: DC
  Filled 2020-04-26 (×3): qty 0.4

## 2020-04-26 MED ORDER — OLANZAPINE 10 MG PO TABS
10.0000 mg | ORAL_TABLET | Freq: Every day | ORAL | Status: DC
  Administered 2020-04-26 – 2020-04-28 (×3): 10 mg via ORAL
  Filled 2020-04-26 (×4): qty 1

## 2020-04-26 MED ORDER — VANCOMYCIN HCL IN DEXTROSE 1-5 GM/200ML-% IV SOLN
1000.0000 mg | Freq: Once | INTRAVENOUS | Status: AC
  Administered 2020-04-26: 1000 mg via INTRAVENOUS
  Filled 2020-04-26: qty 200

## 2020-04-26 MED ORDER — ONDANSETRON HCL 4 MG PO TABS: 4.0000 mg | ORAL_TABLET | Freq: Four times a day (QID) | ORAL | Status: DC | PRN

## 2020-04-26 MED ORDER — SODIUM CHLORIDE 0.9 % IV SOLN
2.0000 g | INTRAVENOUS | Status: DC
  Administered 2020-04-26 – 2020-04-29 (×4): 2 g via INTRAVENOUS
  Filled 2020-04-26: qty 2
  Filled 2020-04-26: qty 20
  Filled 2020-04-26 (×2): qty 2

## 2020-04-26 MED ORDER — PIPERACILLIN-TAZOBACTAM 3.375 G IVPB: 3.3750 g | Freq: Three times a day (TID) | INTRAVENOUS | Status: DC

## 2020-04-26 MED ORDER — SODIUM CHLORIDE 0.9% FLUSH: 10.0000 mL | INTRAVENOUS | Status: DC | PRN

## 2020-04-26 MED ORDER — LITHIUM CARBONATE ER 450 MG PO TBCR
450.0000 mg | EXTENDED_RELEASE_TABLET | Freq: Two times a day (BID) | ORAL | Status: DC
  Filled 2020-04-26: qty 1

## 2020-04-26 MED ORDER — VANCOMYCIN HCL 10 G IV SOLR: 1.0000 g | Freq: Once | INTRAVENOUS | Status: DC

## 2020-04-26 MED ORDER — ONDANSETRON HCL 4 MG/2ML IJ SOLN: 4.0000 mg | Freq: Four times a day (QID) | INTRAMUSCULAR | Status: DC | PRN

## 2020-04-26 MED ORDER — LITHIUM CARBONATE ER 450 MG PO TBCR
450.0000 mg | EXTENDED_RELEASE_TABLET | Freq: Two times a day (BID) | ORAL | Status: DC
  Administered 2020-04-26 – 2020-04-29 (×7): 450 mg via ORAL
  Filled 2020-04-26 (×9): qty 1

## 2020-04-26 MED ORDER — SODIUM CHLORIDE 0.9 % IV SOLN
INTRAVENOUS | Status: DC | PRN
  Administered 2020-04-26: 500 mL via INTRAVENOUS
  Administered 2020-04-28: 100 mL via INTRAVENOUS

## 2020-04-26 MED ORDER — PIPERACILLIN-TAZOBACTAM 3.375 G IVPB 30 MIN: 3.3750 g | Freq: Once | INTRAVENOUS | Status: DC

## 2020-04-26 MED ORDER — MORPHINE SULFATE (PF) 2 MG/ML IV SOLN
2.0000 mg | Freq: Once | INTRAVENOUS | Status: AC
  Administered 2020-04-26: 2 mg via INTRAVENOUS
  Filled 2020-04-26: qty 1

## 2020-04-26 MED ORDER — OXYCODONE HCL 5 MG PO TABS
10.0000 mg | ORAL_TABLET | Freq: Four times a day (QID) | ORAL | Status: DC | PRN
  Administered 2020-04-26 – 2020-04-29 (×13): 10 mg via ORAL
  Filled 2020-04-26 (×13): qty 2

## 2020-04-26 MED ORDER — OLANZAPINE 5 MG PO TABS
5.0000 mg | ORAL_TABLET | Freq: Every morning | ORAL | Status: DC
  Administered 2020-04-26 – 2020-04-29 (×4): 5 mg via ORAL
  Filled 2020-04-26 (×4): qty 1

## 2020-04-26 MED ORDER — VANCOMYCIN HCL IN DEXTROSE 1-5 GM/200ML-% IV SOLN
1000.0000 mg | Freq: Two times a day (BID) | INTRAVENOUS | Status: DC
  Administered 2020-04-26 – 2020-04-27 (×2): 1000 mg via INTRAVENOUS
  Filled 2020-04-26 (×4): qty 200

## 2020-04-26 MED ORDER — ACETAMINOPHEN 325 MG PO TABS: 650.0000 mg | ORAL_TABLET | Freq: Four times a day (QID) | ORAL | Status: DC | PRN

## 2020-04-26 NOTE — ED Notes (Signed)
Spoke with Admit MD Mal Misty and he is ordering home medications for this pt at this time

## 2020-04-26 NOTE — ED Notes (Signed)
Pharmacy to send up medication for pt

## 2020-04-26 NOTE — ED Notes (Signed)
Pt given lunch tray.

## 2020-04-26 NOTE — ED Notes (Signed)
Pt states is having right thigh pain since he awoke yesterday am. Dr. Owens Shark notified, no new orders received.

## 2020-04-26 NOTE — ED Notes (Signed)
Patient transferred to ED 25 via wheel chair. Report to April RN.

## 2020-04-26 NOTE — Progress Notes (Addendum)
Pharmacy Antibiotic Note  Johnny [name]. is a 49 y.o. male admitted on 04/24/2020 with cellulitis.  Pharmacy has been consulted for Rocephin and Vancomycin dosing. Pt states he is allergic to PCN which is not currently in EHR, will add PCN to allergy list.   Plan: Rocephin 2gm IV q24hrs  Vancomycin 1000 mg IV Q 12 hrs. Goal AUC 400-550. Expected AUC: 473.4, Css min 13.9 SCr used: 1.35  Height: 6' (182.9 cm) Weight: 90.7 kg (200 lb) IBW/kg (Calculated) : 77.6  Temp (24hrs), Avg:98.6 F (37 C), Min:97.8 F (36.6 C), Max:99.3 F (37.4 C)  Recent Labs  Lab 04/24/20 1313  WBC 15.5*  CREATININE 1.35*    Estimated Creatinine Clearance: 72.7 mL/min (A) (by C-G formula based on SCr of 1.35 mg/dL (H)).    Allergies  Allergen Reactions  . Crab [Shellfish Allergy] Itching and Swelling  . Flexeril [Cyclobenzaprine] Anaphylaxis  . Codeine Itching  . Latex Swelling    Gloves(when worn), condoms  . Robaxin [Methocarbamol] Itching  . Sulfamethoxazole-Trimethoprim Nausea Only and Other (See Comments)    Stomach pain   . Tramadol Other (See Comments)    Gives headaches    Antimicrobials this admission:   >>    >>   Dose adjustments this admission:   Microbiology results:  BCx:   UCx:    Sputum:    MRSA PCR:   Thank you for allowing pharmacy to be a part of this patient's care.  Hart Robinsons A 04/26/2020 4:24 AM

## 2020-04-26 NOTE — ED Notes (Signed)
Patient complains of increased discomfort at his surgical site. Dr. Owens Shark consulted and will see patient.

## 2020-04-26 NOTE — ED Notes (Signed)
Pt requesting home medications. Chart reviewed and doesn't look like medications have been ordered or reviewed by pharmacy.  Informed Pharmacy of situation and they will update pt's med chart

## 2020-04-26 NOTE — ED Notes (Signed)
Pt sleeping, will not roll over for RN to access nicotine patch for removal. Will pass on to oncoming shift need for removal.

## 2020-04-26 NOTE — H&P (Signed)
History and Physical    [name] Malka So. [id number] DOB: 03-10-1971 DOA: 04/24/2020  PCP: Valerie Roys, DO   Patient coming from: Home  I have personally briefly reviewed patient's old medical records in [place]  Chief Complaint: Pain and redness around surgical site on abdomen  HPI: Johnny [name]. is a 49 y.o. male with medical history significant for chronic back pain who is status post recent umbilical hernia repair on 04/23/2020 who presented to the emergency room initially on 04/24/2020 with a complaint of pain at the surgical site after reaching to pick up a gallon jug of water.  He subsequently noted bruising on the area.   Surgery was consulted and he was thought to have a mild contusion at the site secondary to twisting.  There were no signs of infection or wound dehiscence.  While in the emergency room, patient voiced suicidal ideation and he was kept in the ER under observation by behavioral health with plan to discharge on 04/25/2020.  They did not think that he was depressed or actively suicidal.  On 04/26/2020, during his period of observation he started having increased abdominal discomfort and he was reevaluated by the emergency room provider with concern for expanding area of redness on the abdomen.  Blood work revealed elevated white cell count of 15,000 but was otherwise unremarkable.  Dr. Skeet Latch, surgeon was again consulted, note pending, and requested that patient be admitted to the hospitalist service.   Review of Systems: As per HPI otherwise 10 point review of systems negative.    Past Medical History:  Diagnosis Date  . Arthritis    hands, knees, lower back  . Bilateral sciatica   . Carpal tunnel syndrome   . Chronic back pain   . Chronic neck pain   . Chronic pain 03/12/2020   has been connected to pain management clinics. currently waiting to see psychiatrist before he can join [facility]  . Chronic pain syndrome   . GERD  (gastroesophageal reflux disease)   . Hand joint pain 07/02/2013  . Herniated disc   . Lives in homeless shelter 02/2020   housing ends as of 03/25/20. going to hotel after surgery  . Traumatic amputation of left index finger 2013  . Wears dentures    full upper and lower    Past Surgical History:  Procedure Laterality Date  . AMPUTATION FINGER / THUMB Left 2013  . COLONOSCOPY WITH PROPOFOL N/A 01/03/2018   Procedure: COLONOSCOPY WITH PROPOFOL;  Surgeon: [name], MD;  Location: [place];  Service: Endoscopy;  Laterality: N/A;  . ESOPHAGOGASTRODUODENOSCOPY (EGD) WITH PROPOFOL N/A 01/03/2018   Procedure: ESOPHAGOGASTRODUODENOSCOPY (EGD) WITH PROPOFOL;  Surgeon: [name], MD;  Location: [place];  Service: Endoscopy;  Laterality: N/A;  . HERNIA REPAIR Right    right inguinal hernia repaired twice  . HIP SURGERY  2006   took bone out of hip to put into neck  . JOINT REPLACEMENT Right    knee  . KNEE SURGERY Right   . LUMBAR LAMINECTOMY/DECOMPRESSION MICRODISCECTOMY N/A 11/18/2019   Procedure: LUMBAR FOUR-FIVE LUMBAR LAMINECTOMY/DECOMPRESSION MICRODISCECTOMY;  Surgeon: [name], MD;  Location: [place];  Service: Neurosurgery;  Laterality: N/A;  . LUMBAR WOUND DEBRIDEMENT N/A 12/06/2019   Procedure: LUMBAR WOUND REVISION;  Surgeon: [name], MD;  Location: [place];  Service: Neurosurgery;  Laterality: N/A;  . neck fusion    . POLYPECTOMY N/A 01/03/2018   Procedure: POLYPECTOMY;  Surgeon:  [name], MD;  Location: [place];  Service: Endoscopy;  Laterality: N/A;  . REPAIR OF CEREBROSPINAL FLUID LEAK N/A 11/28/2019   Procedure: REPAIR OF CEREBROSPINAL FLUID LEAK/LUMBAR;  Surgeon: [name], MD;  Location: [place];  Service: Neurosurgery;  Laterality: N/A;  REPAIR OF CEREBROSPINAL FLUID LEAK/LUMBAR  . REPLACEMENT TOTAL KNEE Right   . SHOULDER SURGERY Right 2016 X 2  . SPINAL FUSION    . TONSILLECTOMY    . UMBILICAL HERNIA REPAIR N/A  11/07/2019   Procedure: HERNIA REPAIR UMBILICAL ADULT;  Surgeon: [name], MD;  Location: [facility];  Service: General;  Laterality: N/A;  . WOUND EXPLORATION N/A 12/15/2019   Procedure: LUMBAR WOUND EXPLORATION;  Surgeon: [name], MD;  Location: [place];  Service: Neurosurgery;  Laterality: N/A;  LUMBAR WOUND EXPLORATION     reports that he has been smoking cigarettes. He has a 12.50 pack-year smoking history. He has never used smokeless tobacco. He reports that he does not drink alcohol or use drugs.  Allergies  Allergen Reactions  . Crab [Shellfish Allergy] Itching and Swelling  . Flexeril [Cyclobenzaprine] Anaphylaxis  . Codeine Itching  . Latex Swelling    Gloves(when worn), condoms  . Robaxin [Methocarbamol] Itching  . Sulfamethoxazole-Trimethoprim Nausea Only and Other (See Comments)    Stomach pain   . Tramadol Other (See Comments)    Gives headaches    Family History  Problem Relation Age of Onset  . Cancer Father        sarcoma  . Cirrhosis Paternal Grandmother   . Cancer Paternal Grandfather        Pancreatic  . Fibromyalgia Sister   . Deafness Sister      Prior to Admission medications   Medication Sig Start Date End Date Taking? Authorizing Provider  acetaminophen (TYLENOL) 500 MG tablet Take 2 tablets (1,000 mg total) by mouth every 8 (eight) hours. 04/23/20 05/23/20 Yes [name], MD  diphenhydramine-acetaminophen (TYLENOL PM) 25-500 MG TABS tablet Take 2 tablets by mouth at bedtime as needed (sleep).    Yes [provider]  gabapentin (NEURONTIN) 600 MG tablet Take 2 tablets (1,200 mg total) by mouth 3 (three) times daily. 04/12/20  Yes Johnson, Megan P, DO  hydrOXYzine (VISTARIL) 25 MG capsule Take 25 mg by mouth every 4 (four) hours as needed for anxiety. 04/07/20  Yes [provider]  lithium carbonate (ESKALITH) 450 MG CR tablet Take 450 mg by mouth 2 (two) times daily. 04/07/20  Yes [provider]  OLANZapine  (ZYPREXA) 10 MG tablet Take 10 mg by mouth at bedtime. 04/07/20  Yes [provider]  OLANZapine (ZYPREXA) 5 MG tablet Take 5 mg by mouth daily. 04/07/20  Yes [provider]  oxyCODONE (OXY IR/ROXICODONE) 5 MG immediate release tablet Take 1 tablet (5 mg total) by mouth every 6 (six) hours as needed for severe pain. 04/23/20  Yes [name], MD  tiZANidine (ZANAFLEX) 4 MG tablet Take 4 mg by mouth every 6 (six) hours as needed for muscle spasms.   Yes [provider]  Triamcinolone Acetonide (GOODSENSE NASAL ALLERGY SPRAY NA) Place 1 spray into the nose daily as needed (allergies).   Yes [provider]  varenicline (CHANTIX) 1 MG tablet Take 1 mg by mouth 2 (two) times daily.   Yes [provider]    Physical Exam: Vitals:   04/26/20 0100 04/26/20 0130 04/26/20 0200 04/26/20 0230  BP: (!) 142/91 (!) 137/91 (!) 139/108 (!) 141/102  Pulse: 93  94 87 95  Resp:      Temp:      TempSrc:      SpO2: 94% 93% 94% 94%  Weight:      Height:         Vitals:   04/26/20 0100 04/26/20 0130 04/26/20 0200 04/26/20 0230  BP: (!) 142/91 (!) 137/91 (!) 139/108 (!) 141/102  Pulse: 93 94 87 95  Resp:      Temp:      TempSrc:      SpO2: 94% 93% 94% 94%  Weight:      Height:        Constitutional: Alert and awake, oriented x3, not in any acute distress. Eyes: PERLA, EOMI, irises appear normal, anicteric sclera,  ENMT: external ears and nose appear normal, normal hearing             Lips appears normal, oropharynx mucosa, tongue, posterior pharynx appear normal  Neck: neck appears normal, no masses, normal ROM, no thyromegaly, no JVD  CVS: S1-S2 clear, no murmur rubs or gallops,  , no carotid bruits, pedal pulses palpable, No LE edema Respiratory:  clear to auscultation bilaterally, no wheezing, rales or rhonchi. Respiratory effort normal. No accessory muscle use.  Abdomen: Periumbilical tenderness and redness about 10 cm in diameter.  Small 2 cm  surgical wound with Steri-Strips just inferior to the umbilicus.  Nondistended, normal bowel sounds, no hepatosplenomegaly, no hernias Musculoskeletal: : no cyanosis, clubbing , no contractures or atrophy Neuro: Grossly intact, alert and oriented x3 Psych: judgement and insight appear normal, stable mood and affect,  Skin: no rashes or lesions or ulcers, no induration or nodules.  Cellulitis abdomen as described on the abdomen   Labs on Admission: I have personally reviewed following labs and imaging studies  CBC: Recent Labs  Lab 04/24/20 1313  WBC 15.5*  HGB 15.1  HCT 43.5  MCV 89.5  PLT [id number]   Basic Metabolic Panel: Recent Labs  Lab 04/24/20 1313  NA 135  K 3.6  CL 102  CO2 25  GLUCOSE 103*  BUN 19  CREATININE 1.35*  CALCIUM 8.4*   GFR: Estimated Creatinine Clearance: 72.7 mL/min (A) (by C-G formula based on SCr of 1.35 mg/dL (H)). Liver Function Tests: Recent Labs  Lab 04/24/20 1313  AST 45*  ALT 19  ALKPHOS 121  BILITOT 0.7  PROT 7.8  ALBUMIN 4.2   No results for input(s): LIPASE, AMYLASE in the last 168 hours. No results for input(s): AMMONIA in the last 168 hours. Coagulation Profile: No results for input(s): INR, PROTIME in the last 168 hours. Cardiac Enzymes: No results for input(s): CKTOTAL, CKMB, CKMBINDEX, TROPONINI in the last 168 hours. BNP (last 3 results) No results for input(s): PROBNP in the last 8760 hours. HbA1C: No results for input(s): HGBA1C in the last 72 hours. CBG: No results for input(s): GLUCAP in the last 168 hours. Lipid Profile: No results for input(s): CHOL, HDL, LDLCALC, TRIG, CHOLHDL, LDLDIRECT in the last 72 hours. Thyroid Function Tests: No results for input(s): TSH, T4TOTAL, FREET4, T3FREE, THYROIDAB in the last 72 hours. Anemia Panel: No results for input(s): VITAMINB12, FOLATE, FERRITIN, TIBC, IRON, RETICCTPCT in the last 72 hours. Urine analysis:    Component Value Date/Time   COLORURINE YELLOW (A) 11/02/2019  0108   APPEARANCEUR CLEAR (A) 11/02/2019 0108   APPEARANCEUR Clear 06/19/2017 1454   LABSPEC 1.019 11/02/2019 0108   PHURINE 6.0 11/02/2019 0108   GLUCOSEU NEGATIVE 11/02/2019 0108   HGBUR NEGATIVE 11/02/2019  [place] 11/02/2019 0108   BILIRUBINUR Negative 06/19/2017 [place] 11/02/2019 0108   PROTEINUR NEGATIVE 11/02/2019 0108   NITRITE NEGATIVE 11/02/2019 0108   LEUKOCYTESUR NEGATIVE 11/02/2019 0108    Radiological Exams on Admission: No results found.  EKG: Independently reviewed.   Assessment/Plan Principal Problem:   Surgical wound infection status post umbilical hernia repair [id number]   Abdominal wall cellulitis -IV vancomycin and Zosyn -Wound care -Patient was evaluated by surgeon Dr., In the emergency room, note pending  Chronic back pain -Continue home meds  Recent suicidal thoughts -Patient was evaluated by psychiatry on 04/25/2020 and not thought to be suicidal or homicidal -He was not considered at imminent risk to self or others based on note from 04/25/2020    DVT prophylaxis: Lovenox  Code Status: full code  Family Communication:  none  Disposition Plan: Back to previous home environment Consults called: none  Status:obs    Athena Masse MD [facility]     04/26/2020, 4:15 AM

## 2020-04-26 NOTE — ED Notes (Signed)
Report to sam, rn.  

## 2020-04-26 NOTE — ED Notes (Signed)
Paging Admit MD at this time to question pt's home medication orders. Will wait for reply for next steps.

## 2020-04-26 NOTE — ED Provider Notes (Addendum)
I assumed care of the patient 11:00 PM and was notified by Sherlynn Carbon RN that the patient's area of erythema on his abdominal wall had worsened since yesterday going beyond the border that was outlined here in the emergency department.  Patient also admits to increasing discomfort in the area.  Skin exam: Blanching erythema hot to touch periumbilical abdominal wall as depicted below:     Patient was receiving appropriate oral antibiotic therapy with above depicted worsening.  As such patient given IV vancomycin in the emergency department.  Patient will be admitted to the hospital staff for further evaluation and management with psychiatry consultation.  Patient was also discussed with Dr. [name] general surgery who evaluated the patient in the emergency department and agreed with IV vancomycin and possibility of adding Unasyn.  Dr. [name] requested that the patient be admitted to medicine.    Gregor Hams, MD 04/26/20 0221    Gregor Hams, MD 04/26/20 [phone]

## 2020-04-26 NOTE — Progress Notes (Signed)
He complains of abdominal pain.  No nausea or vomiting.  He said he is still depressed and suicidal.  Physical exam is remarkable for erythema in the mid abdomen surrounding the periumbilical area and tenderness in the abdomen.  Continue IV antibiotics, analgesics and psychotropics.  Follow-up with surgeon and psychiatrist.  Patient has a sitter at the bedside for suicide precautions.

## 2020-04-26 NOTE — Progress Notes (Signed)
RN Erline Levine educated about infusing Vancomycin to midline. Informed that Vancomycin can only be infused thru midline less than 6 days. If  Still needed  Beyond  6 days, VAST team should be notified for new IV access.  RN verbalized understanding

## 2020-04-26 NOTE — ED Notes (Signed)
Pt give breakfast tray

## 2020-04-26 NOTE — ED Notes (Signed)
Pt transported to room 221 by Tech/Sitter and escorted with Naval architect.  Pt's belongings given to [facility]. Pt had 2 bags.

## 2020-04-26 NOTE — ED Notes (Signed)
Sent message to pharmacy to verify vanc

## 2020-04-26 NOTE — ED Notes (Signed)
Ready bed @ 1622. Spoke with Marine scientist.

## 2020-04-26 NOTE — BH Assessment (Addendum)
TTS reassessment complete. Pt denies SI/HI/VH/AH however expressed increased paranoia. Pt is currently due to be admitted to undergo surgery for an infection and is recommended to be reassessed once medically cleared.

## 2020-04-26 NOTE — ED Notes (Signed)
Pt requesting additional pain medication.  

## 2020-04-27 DIAGNOSIS — F315 Bipolar disorder, current episode depressed, severe, with psychotic features: Secondary | ICD-10-CM

## 2020-04-27 DIAGNOSIS — L03311 Cellulitis of abdominal wall: Secondary | ICD-10-CM | POA: Diagnosis not present

## 2020-04-27 DIAGNOSIS — T8149XA Infection following a procedure, other surgical site, initial encounter: Secondary | ICD-10-CM | POA: Diagnosis not present

## 2020-04-27 LAB — CBC
HCT: 44.3 % (ref 39.0–52.0)
Hemoglobin: 15.2 g/dL (ref 13.0–17.0)
MCH: 31 pg (ref 26.0–34.0)
MCHC: 34.3 g/dL (ref 30.0–36.0)
MCV: 90.4 fL (ref 80.0–100.0)
Platelets: 281 10*3/uL (ref 150–400)
RBC: 4.9 MIL/uL (ref 4.22–5.81)
RDW: 13.1 % (ref 11.5–15.5)
WBC: 10 10*3/uL (ref 4.0–10.5)
nRBC: 0 % (ref 0.0–0.2)

## 2020-04-27 MED ORDER — VANCOMYCIN HCL 1250 MG/250ML IV SOLN
1250.0000 mg | Freq: Two times a day (BID) | INTRAVENOUS | Status: DC
  Administered 2020-04-27 – 2020-04-29 (×4): 1250 mg via INTRAVENOUS
  Filled 2020-04-27 (×5): qty 250

## 2020-04-27 NOTE — TOC Progression Note (Signed)
Transition of Care (TOC) - Progression Note    Patient Details  Name: Johnny [name]. MRN: [id number] Date of Birth: [birth year]  [facility]) CM/SW Contact  Beverly Sessions, RN Phone Number: 04/27/2020, 2:51 PM  Clinical Narrative:    Per MD psych to see patient again  RNCM to await revaluation to determine appropriate disposition.   TOC has already provided patient with housing resources, and  Food resources    Expected Discharge Plan: Homeless Shelter(Pt would like to return to [facility]) Barriers to Discharge: Homeless with medical needs(Patient had surgery two days ago)  Expected Discharge Plan and Services Expected Discharge Plan: Homeless Shelter(Pt would like to return to [facility]) In-house Referral: Clinical Social Work                                             Social Determinants of Health (SDOH) Interventions    Readmission Risk Interventions No flowsheet data found.

## 2020-04-27 NOTE — Progress Notes (Signed)
Brief Progress Note Johnny [name]. is a 49 y.o. male 4 days s/p open ventral and left inguinal hernia repairs with Dr [name]. He was admitted on 05/03 and Dr [name] had evaluated in the ED at that time and her recommendation were left in EDP note at that time. Asked to see again this afternoon. Overall he is improved from presentation with resolution in leukocytosis. He does still have post-op soreness. No other complaints aside from wanting to shower.    Physical Exam:  BP 121/88 (BP Location: Right Arm)   Pulse 91   Temp 98.6 F (37 C) (Oral)   Resp 18   Ht 6' (1.829 m)   Wt 92.5 kg   SpO2 96%   BMI 27.66 kg/m   Constitutional: Well appearing, NAD Gastrointestinal: Soft, generalized expected post-op soreness, non-distended, no rebound/guarding Integumentary: Incisions are intact with steri-strips, erythema resolved, he does have significant ecchymosis  Midline Wound (04/27/2020):     Assessment / Plan:  [name]. is a 49 y.o. male 4 days s/p open ventral and left inguinal hernia repairs with improved abdominal wall cellulitis.    - Continue IV ABx; narrow to PO at discharge  - Continue pain control prn  - monitor abdominal examination  - Okay to shower from surgery standpoint, do not submerge incisions, pat dry. He has a PICC and is familiar with showering with these  - Mobilization as tolerated  - No further surgical issues  - further management and disposition per medicine service; please call with questions  Above discussed with Dr [name].   No Charge  -- Edison Simon, PA-C [facility] 04/27/2020, 3:39 PM [phone] M-F: 7am - 4pm

## 2020-04-27 NOTE — Progress Notes (Signed)
   04/27/20 1200  Clinical Encounter Type  Visited With Patient;Health care provider  Visit Type Initial  Referral From Chaplain  Consult/Referral To Chaplain  After leaving morning rounds, chaplain stopped in to see patient. Patient said that he is not feeling to good. He is in pain. Patient believes that he is going to [facility] when a bed is available. Nurse said that is not the case. Chaplain told patient the availability of chaplains.

## 2020-04-27 NOTE — Consult Note (Signed)
[facility] Consult   Reason for Consult:  Paranoia Referring Physician:  Dr. Jacqualine Code Patient Identification: Johnny [name]. MRN:  [id number] Principal Diagnosis: Surgical wound infection Diagnosis:  Principal Problem:   Surgical wound infection Active Problems:   Chronic back pain   Abdominal wall cellulitis   Cellulitis, abdominal wall   Bipolar affective disorder, depressed, severe, with psychotic behavior ([place])   Total Time spent with patient: 20 minutes HPI:  Johnny [name]. is a 49 y.o. male here for evaluation for suicidal thoughts  I met with the patient and discussed the case with nurses.  I also reviewed the prior note below 2 days ago.  The patient presents as someone who is concerned about paranoia.  However the predominant symptom is 1 of depression.  He describes multiple symptoms of depression from helplessness and worthlessness to irritability.  He dates the onset to approximately 3 years ago when his father died and states that he has various anniversary reactions typically revolving around his father's birthday which is in June.  The current worsening started approximately 6 months ago and was highlighted by fears.  These fears have typically been that someone is in the house and is going to hurt him.  He also has seen things that makes him think that someone is breaking the house such as a hinge being manipulated.  This fearfulness has expressed itself as his moving to live in his [name] versus in hotel.  He said he felt more comfortable living in the [place].  Nonetheless this has not been successful and he continues to be quite fearful that people are out to hurt him.  He has very little insight that these may be delusional in nature and is still concerned though he does feel safer here in the hospital.  When he talks about his father he appears to be somewhat tearful although as he says he never cries and is not the kind of person who would  cry.  Subjective per Suella Broad, FNP on 5/2:   Johnny Halt. is a 49 y.o. male patient admitted with complicated medical history involving numerous surgeries, chronic back/neck/knee pain, depression, Bipolar, and chronic opiate use. Today during his evaluation he is alert and oriented, and appears to be resting well. He was seen at this nurses station requesting his medication and then ambulated back to his room to speak with Probation officer. Today he is less irritable, appears calm and appropriate. He continues to want inpatient admission or long term housing. As noted he is homeless and has declined boarding houses, shelters, and hotels. "Im not paying $450 a month to sleep with roaches and be around people that use drugs. The shelter sent me to the boarding house. And the people at the hotel keep trying to get me." He reports some ongoing paranoia that he describes as people out to get him. " I seen people peeking through that window, and I thought they were trying to take the door off the hinges at the hotel. " He continues to have normal thought processes that are relevant and does not appear to be responding to internal stimuli or preoccupied. He denies suicidal ideation, homicidal ideation and or hallucinations.      Patient reports that he is had for several days now paranoia.  Point yesterday drove around [facility] stopped on [address], and felt like a tractor trailer and 4 cars were there just a plan to block him in to come  on to him down  Reports the symptoms have been present and worsening over the last several months after the death of a family member and also a previous back surgery that required several revisions  He had surgery yesterday, and umbilical surgery he still having pain discomfort from that was seen in the ER earlier today for same.  He decided he better check back in given the paranoid symptoms he has been experiencing recently, reports they did not start  immediately yesterday but he has been experiencing them now for several weeks including some suicidal thoughts at times think he might just take his [name] and driving into a tree  Denies overdose or ingestion  Reports previous admission for psychiatric care in the past  The patient has a long history of drug use which has led to his current admission . His chronic pain has been complicated by surgeries and surgical infections. He recently had surgery 04/23/20 for an abdominal hernia repair. Patient with a PMDP score of 500, and recent cocaine use.  He is trying to get established by pain management however he has been declined by numerous facilities due to his illicit drug use and or opiate addiction. He has an appointment with psychological evaluation through [facility] as part of his pain management work-up.   Assessment: 49 year old male who presents to the ER endorsing paranoia, suicidal ideations and anger. Today during the evaluation he is alert and oriented. He is observed to be resting well in his bed, and acknoweledges psych team by turning his volume down on the TV upon entry into the room. Discussed with patient social work consult and plans to discharge him from the hospital. He inquires about inpatient admission or long term rehab. Writer discussed with patient that he does not meet inpatient criteria. He was recently discharged from [facility] after a 10 day length of stay per patient, and medication management. Per chart review he has been working closely with [facility] team to find housing however he has declined them citing unsanitary conditions and toxic environment. This was reviewed with him and states "they not really helping me I need long term rehab and to get away from [place]." At this time patient has normal thought processes, he does not appear to be responding to internal stimuli.  So at this point in time he is predominantly here for housing purposes by his own admission he denies  current suicidal thoughts plans or intent. At this time he denies homicidal ideations, and or hallucinations.  This patient has chronic suicidal ideations related to his chronic pain, chronic substance abuse and at this time he does express  interest in quitting his drug use and is seeking comfort in the ER for  secondary gain and housing due to being homeless.   Past Psychiatric History:Depression and Bipolar. No reported suicide attempts. Currently taking Lithium and Vistaril. He has not followed up with outpatient since his most recent admission to [facility] due to surgery.   Risk to Self: Suicidal Ideation: No-Not Currently/Within Last 6 Months Suicidal Intent: No Is patient at risk for suicide?: No, but patient needs Medical Clearance Suicidal Plan?: No-Not Currently/Within Last 6 Months Specify Current Suicidal Plan: Overdose on drugs  Access to Means: Yes(Not currently ) Specify Access to Suicidal Means: drugs  What has been your use of drugs/alcohol within the last 12 months?: Cocaine, Marijuana and alcohol  How many times?: 1 Other Self Harm Risks: None reported  Triggers for Past Attempts: Other (  Comment)(family conflict ) Intentional Self Injurious Behavior: None Yes Risk to Others: Homicidal Ideation: No Thoughts of Harm to Others: No Current Homicidal Intent: No Current Homicidal Plan: No Access to Homicidal Means: No Identified Victim: n/a History of harm to others?: No(None reported ) Assessment of Violence: None Noted Violent Behavior Description: n/a Does patient have access to weapons?: No Criminal Charges Pending?: Yes Describe Pending Criminal Charges: traffic violations Does patient have a court date: Yes Court Date: (Reported to be unaware ) No Prior Inpatient Therapy: Prior Inpatient Therapy: Yes Prior Therapy Dates: 03/2020 Prior Therapy Facilty/Provider(s): [facility] Reason for Treatment: [facility] x1 03/2020 Prior Outpatient Therapy: Prior Outpatient Therapy:  No Does patient have an ACCT team?: No Does patient have Intensive In-House Services?  : No Does patient have [facility] services? : No Does patient have P4CC services?: NoNone at this time  Past Medical History:  Past Medical History:  Diagnosis Date  . Arthritis    hands, knees, lower back  . Bilateral sciatica   . Carpal tunnel syndrome   . Chronic back pain   . Chronic neck pain   . Chronic pain 03/12/2020   has been connected to pain management clinics. currently waiting to see psychiatrist before he can join [facility]  . Chronic pain syndrome   . GERD (gastroesophageal reflux disease)   . Hand joint pain 07/02/2013  . Herniated disc   . Lives in homeless shelter 02/2020   housing ends as of 03/25/20. going to hotel after surgery  . Traumatic amputation of left index finger 2013  . Wears dentures    full upper and lower    Past Surgical History:  Procedure Laterality Date  . AMPUTATION FINGER / THUMB Left 2013  . COLONOSCOPY WITH PROPOFOL N/A 01/03/2018   Procedure: COLONOSCOPY WITH PROPOFOL;  Surgeon: [name], MD;  Location: [facility];  Service: Endoscopy;  Laterality: N/A;  . ESOPHAGOGASTRODUODENOSCOPY (EGD) WITH PROPOFOL N/A 01/03/2018   Procedure: ESOPHAGOGASTRODUODENOSCOPY (EGD) WITH PROPOFOL;  Surgeon: [name], MD;  Location: [facility];  Service: Endoscopy;  Laterality: N/A;  . HERNIA REPAIR Right    right inguinal hernia repaired twice  . HIP SURGERY  2006   took bone out of hip to put into neck  . INGUINAL HERNIA REPAIR Left 04/23/2020   Procedure: HERNIA REPAIR INGUINAL ADULT;  Surgeon: [name], MD;  Location: [facility];  Service: General;  Laterality: Left;  . JOINT REPLACEMENT Right    knee  . KNEE SURGERY Right   . LUMBAR LAMINECTOMY/DECOMPRESSION MICRODISCECTOMY N/A 11/18/2019   Procedure: LUMBAR FOUR-FIVE LUMBAR LAMINECTOMY/DECOMPRESSION MICRODISCECTOMY;  Surgeon: [name], MD;  Location: [facility] [place];  Service:  Neurosurgery;  Laterality: N/A;  . LUMBAR WOUND DEBRIDEMENT N/A 12/06/2019   Procedure: LUMBAR WOUND REVISION;  Surgeon: [name], MD;  Location: [facility];  Service: Neurosurgery;  Laterality: N/A;  . neck fusion    . POLYPECTOMY N/A 01/03/2018   Procedure: POLYPECTOMY;  Surgeon: [name], MD;  Location: [facility];  Service: Endoscopy;  Laterality: N/A;  . REPAIR OF CEREBROSPINAL FLUID LEAK N/A 11/28/2019   Procedure: REPAIR OF CEREBROSPINAL FLUID LEAK/LUMBAR;  Surgeon: [name], MD;  Location: [facility];  Service: Neurosurgery;  Laterality: N/A;  REPAIR OF CEREBROSPINAL FLUID LEAK/LUMBAR  . REPLACEMENT TOTAL KNEE Right   . SHOULDER SURGERY Right 2016 X 2  . SPINAL FUSION    . TONSILLECTOMY    . UMBILICAL HERNIA REPAIR N/A 11/07/2019   Procedure: HERNIA  REPAIR UMBILICAL ADULT;  Surgeon: [name], MD;  Location: [facility];  Service: General;  Laterality: N/A;  . UMBILICAL HERNIA REPAIR N/A 04/23/2020   Procedure: HERNIA REPAIR UMBILICAL ADULT;  Surgeon: [name], MD;  Location: [facility];  Service: General;  Laterality: N/A;  . WOUND EXPLORATION N/A 12/15/2019   Procedure: LUMBAR WOUND EXPLORATION;  Surgeon: [name], MD;  Location: [facility];  Service: Neurosurgery;  Laterality: N/A;  LUMBAR WOUND EXPLORATION   Family History:  Family History  Problem Relation Age of Onset  . Cancer Father        sarcoma  . Cirrhosis Paternal Grandmother   . Cancer Paternal Grandfather        Pancreatic  . Fibromyalgia Sister   . Deafness Sister    Family Psychiatric  History: Denies Social History:  Social History   Substance and Sexual Activity  Alcohol Use No     Social History   Substance and Sexual Activity  Drug Use No    Social History   Socioeconomic History  . Marital status: Divorced    Spouse name: Not on file  . Number of children: Not on file  . Years of education: Not on file  . Highest education level: Not on file  Occupational  History  . Occupation: delivery driver  Tobacco Use  . Smoking status: Current Every Day Smoker    Packs/day: 0.50    Years: 25.00    Pack years: 12.50    Types: Cigarettes  . Smokeless tobacco: Never Used  . Tobacco comment: will start Chantix soon.  Substance and Sexual Activity  . Alcohol use: No  . Drug use: No  . Sexual activity: Yes  Other Topics Concern  . Not on file  Social History Narrative   Patient currently lives in homeless shelter with 6 other men. This arrangement ends as of 03/25/2020.  Social Work has been aggressively working with patient regarding housing and financial issues. He will go to a hotel after surgery and has a friend named [name] that will stay with him. Unsure of where he will go after that day.   He has met with [facility] at [facility] pain clinic but will not receive any pain medicine from them until he sees a psychiatrist. This is scheduled for May.  He is currently out of pain medicine and has frequented the ER for help.   Dr. [name] will be able to order post op medicine since he has not yet signed a contract.   Social Determinants of Health   Financial Resource Strain:   . Difficulty of Paying Living Expenses:   Food Insecurity:   . Worried About Charity fundraiser in the Last Year:   . Arboriculturist in the Last Year:   Transportation Needs:   . Film/video editor (Medical):   Marland Kitchen Lack of Transportation (Non-Medical):   Physical Activity:   . Days of Exercise per Week:   . Minutes of Exercise per Session:   Stress:   . Feeling of Stress :   Social Connections:   . Frequency of Communication with Friends and Family:   . Frequency of Social Gatherings with Friends and Family:   . Attends Religious Services:   . Active Member of Clubs or Organizations:   . Attends Archivist Meetings:   Marland Kitchen Marital Status:    Additional Social History:    Allergies:   Allergies  Allergen Reactions  . Crab [Shellfish Allergy] Itching and  Swelling   . Flexeril [Cyclobenzaprine] Anaphylaxis  . Codeine Itching  . Latex Swelling    Gloves(when worn), condoms  . Penicillins Nausea And Vomiting    Did it involve swelling of the face/tongue/throat, SOB, or low BP? No Did it involve sudden or severe rash/hives, skin peeling, or any reaction on the inside of your mouth or nose? No Did you need to seek medical attention at a hospital or doctor's office? No When did it last happen?10+years If all above answers are "NO", may proceed with cephalosporin use.    . Robaxin [Methocarbamol] Itching  . Sulfamethoxazole-Trimethoprim Nausea Only and Other (See Comments)    Stomach pain   . Tramadol Other (See Comments)    Gives headaches    Labs:  Results for orders placed or performed during the hospital encounter of 04/24/20 (from the past 48 hour(s))  CBC     Status: Abnormal   Collection Time: 04/26/20 12:58 AM  Result Value Ref Range   WBC 14.4 (H) 4.0 - 10.5 K/uL   RBC 5.07 4.22 - 5.81 MIL/uL   Hemoglobin 15.5 13.0 - 17.0 g/dL   HCT 47.4 39.0 - 52.0 %   MCV 93.5 80.0 - 100.0 fL   MCH 30.6 26.0 - 34.0 pg   MCHC 32.7 30.0 - 36.0 g/dL   RDW 14.4 11.5 - 15.5 %   Platelets 272 150 - 400 K/uL   nRBC 0.0 0.0 - 0.2 %    Comment: Performed at [facility], [address] [place], [place] [address]  Creatinine, serum     Status: None   Collection Time: 04/26/20 12:58 AM  Result Value Ref Range   Creatinine, Ser 1.11 0.61 - 1.24 mg/dL   GFR calc non Af Amer >60 >60 mL/min   GFR calc Af Amer >60 >60 mL/min    Comment: Performed at [facility], [address] [place], [place] [address]  CBC     Status: None   Collection Time: 04/27/20  9:10 AM  Result Value Ref Range   WBC 10.0 4.0 - 10.5 K/uL   RBC 4.90 4.22 - 5.81 MIL/uL   Hemoglobin 15.2 13.0 - 17.0 g/dL   HCT 44.3 39.0 - 52.0 %   MCV 90.4 80.0 - 100.0 fL   MCH 31.0 26.0 - 34.0 pg   MCHC 34.3 30.0 - 36.0 g/dL   RDW 13.1 11.5 - 15.5 %   Platelets 281  150 - 400 K/uL   nRBC 0.0 0.0 - 0.2 %    Comment: Performed at [facility], [address], [place], [place] [address]    Current Facility-Administered Medications  Medication Dose Route Frequency Provider Last Rate Last Admin  . 0.9 %  sodium chloride infusion   Intravenous PRN Jennye Boroughs, MD   Stopped at 04/27/20 0831  . acetaminophen (TYLENOL) tablet 650 mg  650 mg Oral Q6H PRN Athena Masse, MD       Or  . acetaminophen (TYLENOL) suppository 650 mg  650 mg Rectal Q6H PRN Athena Masse, MD      . cefTRIAXone (ROCEPHIN) 2 g in sodium chloride 0.9 % 100 mL IVPB  2 g Intravenous Q24H Hall, Scott A, RPH   Stopped at 04/27/20 0600  . enoxaparin (LOVENOX) injection 40 mg  40 mg Subcutaneous Q24H Judd Gaudier V, MD      . gabapentin (NEURONTIN) tablet 1,200 mg  1,200 mg Oral TID Carrie Mew, MD   1,200 mg at 04/27/20  1724  . hydrOXYzine (ATARAX/VISTARIL) tablet 25 mg  25 mg Oral Q4H PRN Delman Kitten, MD   25 mg at 04/26/20 2103  . ibuprofen (ADVIL) tablet 800 mg  800 mg Oral Q8H PRN Delman Kitten, MD   800 mg at 04/26/20 1232  . lithium carbonate (ESKALITH) CR tablet 450 mg  450 mg Oral BID Jennye Boroughs, MD   450 mg at 04/27/20 0949  . nicotine (NICODERM CQ - dosed in mg/24 hours) patch 14 mg  14 mg Transdermal Q0600 Delman Kitten, MD   14 mg at 04/27/20 0530  . OLANZapine (ZYPREXA) tablet 5 mg  5 mg Oral q morning - 10a Hallaji, Dani Gobble, RPH   5 mg at 04/27/20 0949   And  . OLANZapine (ZYPREXA) tablet 10 mg  10 mg Oral QHS Hallaji, Dani Gobble, RPH   10 mg at 04/26/20 2103  . ondansetron (ZOFRAN) tablet 4 mg  4 mg Oral Q6H PRN Athena Masse, MD       Or  . ondansetron [facility]) injection 4 mg  4 mg Intravenous Q6H PRN Athena Masse, MD      . oxyCODONE (Oxy IR/ROXICODONE) immediate release tablet 10 mg  10 mg Oral Q6H PRN Athena Masse, MD   10 mg at 04/27/20 1440  . sodium chloride flush (NS) 0.9 % injection 10-40 mL  10-40 mL Intracatheter PRN Jennye Boroughs, MD       . tiZANidine (ZANAFLEX) tablet 4 mg  4 mg Oral Q6H PRN Carrie Mew, MD   4 mg at 04/26/20 1556  . vancomycin (VANCOREADY) IVPB 1250 mg/250 mL  1,250 mg Intravenous Q12H Hallaji, Sheema M, RPH 166.7 mL/hr at 04/27/20 1729 1,250 mg at 04/27/20 1729     Psychiatric Specialty Exam: Physical Exam  Review of Systems  Blood pressure 121/88, pulse 91, temperature 98.6 F (37 C), temperature source Oral, resp. rate 18, height 6' (1.829 m), weight 92.5 kg, SpO2 96 %.Body mass index is 27.66 kg/m.  General Appearance: Fairly Groomed  Eye Contact:  Good  Speech:  Clear and Coherent and Normal Rate  Volume:  Normal  Mood:  Anxious, Depressed and Hopeless  Affect:  Appropriate and Congruent  Thought Process:  Coherent, Goal Directed, Linear and Descriptions of Associations: Intact  Orientation:  Full (Time, Place, and Person)  Thought Content:  Paranoid Ideation and Rumination  Suicidal Thoughts:  No  Homicidal Thoughts:  No  Memory:  Immediate;   Fair Recent;   Fair  Judgement:  Impaired  Insight:  Lacking and Present  Psychomotor Activity:  Normal  Concentration:  Concentration: Fair and Attention Span: Fair  Recall:  [employer] of Knowledge:  Fair  Language:  Fair  Akathisia:  No  Handed:  Right  AIMS (if indicated):     Assets:  Communication Skills Desire for Improvement Financial Resources/Insurance Resilience Transportation  ADL's:  Intact  Cognition:  WNL  Sleep:      The most likely diagnosis for the patient is bipolar affective disorder or major depression with psychosis.  I do not have a clear history supporting bipolar disorder however it is mentioned in past records and he is currently on lithium.   The one provision is that he also has a long history of substance use using multiple substances.  At this time most the substance will likely have washed out and this does not fit the long-term nature of the history however there may be a substance-induced psychotic  process at  work as well given the long-term history of alcohol and substance use.  Treatment Plan Summary: He is on lithium although I do not see a recent lithium level to judge adherence or achievement of a successful therapeutic level so the most important for step is to ascertain his current lithium level  He currently receives olanzapine at a total dose of 15 mg a day.  This is a quite substantial dose however if psychosis is secondary to depression then it may not be responsive to olanzapine in the same way that other depressive symptomatology of a psychotic nature does.  The psychosis is longstanding enough that it has become delusional in nature.  If the patient has adequate coverage for potential mania then I would start sertraline at 50 mg at night.  Disposition: Recommend psychiatric Inpatient admission when medically cleared.  Alesia Morin, MD 04/27/2020 6:20 PM

## 2020-04-27 NOTE — Progress Notes (Addendum)
Progress Note    Johnny [name].  [id number] DOB: [birth year]  DOA: 04/24/2020 PCP: Valerie Roys, DO      Brief Narrative:    Medical records reviewed and are as summarized below:  Johnny [name]. is an 49 y.o. male with medical history significant for chronic back pain who is status post recent umbilical hernia repair on 04/23/2020 who presented to the emergency room initially on 04/24/2020 with a complaint of pain at the surgical site after reaching to pick up a gallon jug of water.  He subsequently noted bruising on the area.   Surgery was consulted and he was thought to have a mild contusion at the site secondary to twisting.  There were no signs of infection or wound dehiscence.  While in the emergency room, patient voiced suicidal ideation and he was kept in the ER under observation by behavioral health with plan to discharge on 04/25/2020.  They did not think that he was depressed or actively suicidal.  On 04/26/2020, during his period of observation he started having increased abdominal discomfort and he was reevaluated by the emergency room provider with concern for expanding area of redness on the abdomen.  Blood work revealed elevated white cell count of 15,000 but was otherwise unremarkable.  Dr. [name], surgeon was again consulted, note pending, and requested that patient be admitted to the hospitalist service.      Assessment/Plan:   Principal Problem:   Surgical wound infection Active Problems:   Chronic back pain   Abdominal wall cellulitis   Cellulitis, abdominal wall   Abdominal wall cellulitis/surgical wound infection status post umbilical hernia repair on 04/23/2020: Continue empiric IV antibiotics.  Analgesics as needed for pain.  Chronic back and neck pain: Continue analgesics.  Depression with suicidal thoughts and hallucinations: Continue lithium and olanzapine.  Consulted psychiatrist, Dr. Satira Sark, for further evaluation.  Continue one-to-one  sitter for suicide precautions.  Body mass index is 27.66 kg/m.   Family Communication/Anticipated D/C date and plan/Code Status   DVT prophylaxis: Lovenox Code Status: Full code Family Communication: Plan discussed with patient Disposition Plan:    Status is: Inpatient  Remains inpatient appropriate because:IV treatments appropriate due to intensity of illness or inability to take PO and Inpatient level of care appropriate due to severity of illness   Dispo: The patient is from: Home              Anticipated d/c is to: Behavioral health department              Anticipated d/c date is: 2 days              Patient currently is not medically stable to d/c.            Subjective:   He complains of mid abdominal pain.  Pain is a little better compared to yesterday.  He also complains of hallucinations.  He said he has more visual hallucinations than auditory hallucinations.  He still has suicidal thoughts from time to time.  Objective:    Vitals:   04/26/20 1630 04/26/20 1706 04/26/20 2200 04/27/20 1305  BP: (!) 123/93 115/81 121/89 121/88  Pulse: 82 89 85 91  Resp:  20 20 18   Temp:  98.2 F (36.8 C) 98.3 F (36.8 C) 98.6 F (37 C)  TempSrc:  Oral Oral Oral  SpO2: 97% 98% 97% 96%  Weight:  92.5 kg    Height:  No data found.   Intake/Output Summary (Last 24 hours) at 04/27/2020 1451 Last data filed at 04/27/2020 1300 Gross per 24 hour  Intake 939.52 ml  Output 0 ml  Net 939.52 ml   Filed Weights   04/24/20 1306 04/26/20 0737 04/26/20 1706  Weight: 90.7 kg 90.7 kg 92.5 kg    Exam:  GEN: NAD SKIN: No rash EYES: EOMI ENT: MMM CV: RRR PULM: CTA B ABD: soft, ND, periumbilical tenderness and erythema with mild swelling, +BS CNS: AAO x 3, non focal EXT: No edema or tenderness Psych: Cooperative and calm   Data Reviewed:   I have personally reviewed following labs and imaging studies:  Labs: Labs show the following:   Basic Metabolic  Panel: Recent Labs  Lab 04/24/20 1313 04/26/20 0058  NA 135  --   K 3.6  --   CL 102  --   CO2 25  --   GLUCOSE 103*  --   BUN 19  --   CREATININE 1.35* 1.11  CALCIUM 8.4*  --    GFR Estimated Creatinine Clearance: 88.4 mL/min (by C-G formula based on SCr of 1.11 mg/dL). Liver Function Tests: Recent Labs  Lab 04/24/20 1313  AST 45*  ALT 19  ALKPHOS 121  BILITOT 0.7  PROT 7.8  ALBUMIN 4.2   No results for input(s): LIPASE, AMYLASE in the last 168 hours. No results for input(s): AMMONIA in the last 168 hours. Coagulation profile No results for input(s): INR, PROTIME in the last 168 hours.  CBC: Recent Labs  Lab 04/24/20 1313 04/26/20 0058 04/27/20 0910  WBC 15.5* 14.4* 10.0  HGB 15.1 15.5 15.2  HCT 43.5 47.4 44.3  MCV 89.5 93.5 90.4  PLT 284 272 281   Cardiac Enzymes: No results for input(s): CKTOTAL, CKMB, CKMBINDEX, TROPONINI in the last 168 hours. BNP (last 3 results) No results for input(s): PROBNP in the last 8760 hours. CBG: No results for input(s): GLUCAP in the last 168 hours. D-Dimer: No results for input(s): DDIMER in the last 72 hours. Hgb A1c: No results for input(s): HGBA1C in the last 72 hours. Lipid Profile: No results for input(s): CHOL, HDL, LDLCALC, TRIG, CHOLHDL, LDLDIRECT in the last 72 hours. Thyroid function studies: No results for input(s): TSH, T4TOTAL, T3FREE, THYROIDAB in the last 72 hours.  Invalid input(s): FREET3 Anemia work up: No results for input(s): VITAMINB12, FOLATE, FERRITIN, TIBC, IRON, RETICCTPCT in the last 72 hours. Sepsis Labs: Recent Labs  Lab 04/24/20 1313 04/26/20 0058 04/27/20 0910  WBC 15.5* 14.4* 10.0    Microbiology Recent Results (from the past 240 hour(s))  SARS CORONAVIRUS 2 (TAT 6-24 HRS) Nasopharyngeal Nasopharyngeal Swab     Status: None   Collection Time: 04/21/20  8:30 AM   Specimen: Nasopharyngeal Swab  Result Value Ref Range Status   SARS Coronavirus 2 NEGATIVE NEGATIVE Final     Comment: (NOTE) SARS-CoV-2 target nucleic acids are NOT DETECTED. The SARS-CoV-2 RNA is generally detectable in upper and lower respiratory specimens during the acute phase of infection. Negative results do not preclude SARS-CoV-2 infection, do not rule out co-infections with other pathogens, and should not be used as the sole basis for treatment or other patient management decisions. Negative results must be combined with clinical observations, patient history, and epidemiological information. The expected result is Negative. Fact Sheet for Patients: SugarRoll.be Fact Sheet for Healthcare Providers: https://www.woods-mathews.com/ This test is not yet approved or cleared by the [place] FDA and  has been authorized for detection  and/or diagnosis of SARS-CoV-2 by FDA under an Emergency Use Authorization (EUA). This EUA will remain  in effect (meaning this test can be used) for the duration of the COVID-19 declaration under Section 56 4(b)(1) of the Act, 21 U.S.C. section 360bbb-3(b)(1), unless the authorization is terminated or revoked sooner. Performed at [facility], [place] [address], [place], [place] [address]   Respiratory Panel by RT PCR (Flu A&B, Covid) - Nasopharyngeal Swab     Status: None   Collection Time: 04/24/20  2:06 PM   Specimen: Nasopharyngeal Swab  Result Value Ref Range Status   SARS Coronavirus 2 by RT PCR NEGATIVE NEGATIVE Final    Comment: (NOTE) SARS-CoV-2 target nucleic acids are NOT DETECTED. The SARS-CoV-2 RNA is generally detectable in upper respiratoy specimens during the acute phase of infection. The lowest concentration of SARS-CoV-2 viral copies this assay can detect is 131 copies/mL. A negative result does not preclude SARS-Cov-2 infection and should not be used as the sole basis for treatment or other patient management decisions. A negative result may occur with  improper specimen  collection/handling, submission of specimen other than nasopharyngeal swab, presence of viral mutation(s) within the areas targeted by this assay, and inadequate number of viral copies (<131 copies/mL). A negative result must be combined with clinical observations, patient history, and epidemiological information. The expected result is Negative. Fact Sheet for Patients:  PinkCheek.be Fact Sheet for Healthcare Providers:  GravelBags.it This test is not yet ap proved or cleared by the [place] FDA and  has been authorized for detection and/or diagnosis of SARS-CoV-2 by FDA under an Emergency Use Authorization (EUA). This EUA will remain  in effect (meaning this test can be used) for the duration of the COVID-19 declaration under Section 564(b)(1) of the Act, 21 U.S.C. section 360bbb-3(b)(1), unless the authorization is terminated or revoked sooner.    Influenza A by PCR NEGATIVE NEGATIVE Final   Influenza B by PCR NEGATIVE NEGATIVE Final    Comment: (NOTE) The Xpert Xpress SARS-CoV-2/FLU/RSV assay is intended as an aid in  the diagnosis of influenza from Nasopharyngeal swab specimens and  should not be used as a sole basis for treatment. Nasal washings and  aspirates are unacceptable for Xpert Xpress SARS-CoV-2/FLU/RSV  testing. Fact Sheet for Patients: PinkCheek.be Fact Sheet for Healthcare Providers: GravelBags.it This test is not yet approved or cleared by the [place] FDA and  has been authorized for detection and/or diagnosis of SARS-CoV-2 by  FDA under an Emergency Use Authorization (EUA). This EUA will remain  in effect (meaning this test can be used) for the duration of the  Covid-19 declaration under Section 564(b)(1) of the Act, 21  U.S.C. section 360bbb-3(b)(1), unless the authorization is  terminated or revoked. Performed at [facility],  [address] [place], [place] [address]     Procedures and diagnostic studies:  No results found.  Medications:   . enoxaparin (LOVENOX) injection  40 mg Subcutaneous Q24H  . gabapentin  1,200 mg Oral TID  . lithium carbonate  450 mg Oral BID  . nicotine  14 mg Transdermal Q0600  . OLANZapine  5 mg Oral q morning - 10a   And  . OLANZapine  10 mg Oral QHS   Continuous Infusions: . sodium chloride Stopped (04/26/20 2253)  . cefTRIAXone (ROCEPHIN)  IV 2 g (04/27/20 0530)  . vancomycin       LOS: 1 day   Angie Piercey  [facility]  04/27/2020, 2:51 PM

## 2020-04-27 NOTE — Progress Notes (Signed)
Pharmacy Antibiotic Note  Johnny [name]. is a 49 y.o. male admitted on 04/24/2020 with cellulitis.  Pharmacy has been consulted for Rocephin and Vancomycin dosing.   Plan: Rocephin 2gm IV q24hrs  Will adjust dose to Vancomycin 1250 mg IV Q 12 hrs.  Goal AUC 400-550. Expected AUC: 482, Css min 13.7 SCr used: 1.11  Height: 6' (182.9 cm) Weight: 92.5 kg (203 lb 14.8 oz) IBW/kg (Calculated) : 77.6  Temp (24hrs), Avg:98.3 F (36.8 C), Min:98.2 F (36.8 C), Max:98.3 F (36.8 C)  Recent Labs  Lab 04/24/20 1313 04/26/20 0058  WBC 15.5* 14.4*  CREATININE 1.35* 1.11    Estimated Creatinine Clearance: 88.4 mL/min (by C-G formula based on SCr of 1.11 mg/dL).    Allergies  Allergen Reactions  . Crab [Shellfish Allergy] Itching and Swelling  . Flexeril [Cyclobenzaprine] Anaphylaxis  . Codeine Itching  . Latex Swelling    Gloves(when worn), condoms  . Penicillins Nausea And Vomiting    Did it involve swelling of the face/tongue/throat, SOB, or low BP? No Did it involve sudden or severe rash/hives, skin peeling, or any reaction on the inside of your mouth or nose? No Did you need to seek medical attention at a hospital or doctor's office? No When did it last happen?10+years If all above answers are "NO", may proceed with cephalosporin use.    . Robaxin [Methocarbamol] Itching  . Sulfamethoxazole-Trimethoprim Nausea Only and Other (See Comments)    Stomach pain   . Tramadol Other (See Comments)    Gives headaches    Antimicrobials this admission: 5/1 cephalexin/ Bactrim>>  5/3 5/3 Ceftriaxone  >>  5/3 vancomycin  >>    Thank you for allowing pharmacy to be a part of this patient's care.  Pernell Dupre, PharmD, BCPS Clinical Pharmacist 04/27/2020 9:12 AM

## 2020-04-28 DIAGNOSIS — T8149XA Infection following a procedure, other surgical site, initial encounter: Secondary | ICD-10-CM

## 2020-04-28 DIAGNOSIS — F315 Bipolar disorder, current episode depressed, severe, with psychotic features: Secondary | ICD-10-CM

## 2020-04-28 LAB — CREATININE, SERUM
Creatinine, Ser: 1.12 mg/dL (ref 0.61–1.24)
GFR calc Af Amer: >60 mL/min (ref >60)
GFR calc non Af Amer: >60 mL/min (ref >60)

## 2020-04-28 MED ORDER — CIPROFLOXACIN HCL 500 MG PO TABS
500.0000 mg | ORAL_TABLET | Freq: Two times a day (BID) | ORAL | 0 refills | Status: DC
Start: 2020-04-28 — End: 2020-04-28

## 2020-04-28 MED ORDER — CIPROFLOXACIN HCL 500 MG PO TABS
500.0000 mg | ORAL_TABLET | Freq: Two times a day (BID) | ORAL | 0 refills | Status: DC
Start: 2020-04-28 — End: 2020-05-04

## 2020-04-28 MED ORDER — DOXYCYCLINE HYCLATE 50 MG PO CAPS
100.0000 mg | ORAL_CAPSULE | Freq: Two times a day (BID) | ORAL | 0 refills | Status: DC
Start: 2020-04-28 — End: 2020-05-04

## 2020-04-28 NOTE — Discharge Summary (Signed)
Physician Discharge Summary  Patient ID: Johnny [name]. MRN: [id number] DOB/AGE: 04/10/1971 49 y.o.  Admit date: 04/24/2020 Discharge date: 04/28/2020  Admission Diagnoses: Surgical wound infection Discharge Diagnoses:  Principal Problem:   Surgical wound infection Active Problems:   Chronic back pain   Abdominal wall cellulitis   Cellulitis, abdominal wall   Bipolar affective disorder, depressed, severe, with psychotic behavior [facility])   Discharged Condition: good  Hospital Course:  Johnny [name]. is an 49 y.o. male with medical history significant forchronic back pain who is status post recent umbilical hernia repair on 04/23/2020 who presented to the emergency room initially on 04/24/2020 with a complaint of pain at the surgical site after reaching to pick up a gallon jug of water. He subsequently noted bruising on the area. Surgery was consulted and he was thought to have a mild contusion at the site secondary to twisting. There were no signs of infection or wound dehiscence. While in the emergency room, patient voiced suicidal ideation and he was kept in the ER under observation by behavioral health with plan to discharge on 04/25/2020. They did not think that he was depressed or actively suicidal. On 04/26/2020, during his period of observation he started having increased abdominal discomfort and he was reevaluated by the emergency room provider with concern for expanding area of redness on the abdomen. Blood work revealed elevated white cell count of 15,000 but was otherwise unremarkable. Dr. [name], surgeon was again consulted, note pending, and requested that patient be admitted to the hospitalist service.  Patient was given IV antibiotics with Rocephin and vancomycin.  I examined the patient today, noted 2 wounds, one at periumbilical area, with small amount draining.  But no secondary redness anymore.  Another one at left inguinal area, no significant redness.  At this  point, it is reasonable to change to oral antibiotics.  I will cover with doxycycline and Cipro for additional 7 days.  Patient will need to follow-up with his family doctor in 1 week time.  Patient is transferred to inpatient inpatient psychiatry for suicidal thoughts.  Patient instructions: #1.  Complete antibiotics. 2.  Follow-up with your family doctor in 1 week time.    Consults: General surgery  Significant Diagnostic Studies: None  Treatments: Rocephin and vancomycin.  Discharge Exam: Blood pressure (!) 135/91, pulse 85, temperature 97.7 F (36.5 C), temperature source Oral, resp. rate 18, height 49' (1.829 m), weight 92.5 kg, SpO2 99 %. General appearance: alert and cooperative Resp: clear to auscultation bilaterally Cardio: regular rate and rhythm, S1, S2 normal, no murmur, click, rub or gallop GI: soft, non-tender; bowel sounds normal; no masses,  no organomegaly Extremities: extremities normal, atraumatic, no cyanosis or edema Neurologic: Grossly normal  Disposition: Discharge disposition: 93-DC/txfr to psych unit of hospital with planned acute hosp IP readmission       Discharge Instructions    Diet - low sodium heart healthy   Complete by: As directed    Increase activity slowly   Complete by: As directed      Allergies as of 04/28/2020      Reactions   Crab [shellfish Allergy] Itching, Swelling   Flexeril [cyclobenzaprine] Anaphylaxis   Codeine Itching   Latex Swelling   Gloves(when worn), condoms   Penicillins Nausea And Vomiting   Did it involve swelling of the face/tongue/throat, SOB, or low BP? No Did it involve sudden or severe rash/hives, skin peeling, or any reaction on the inside of your mouth or nose? No  Did you need to seek medical attention at a hospital or doctor's office? No When did it last happen?10+years If all above answers are "NO", may proceed with cephalosporin use.   Robaxin [methocarbamol] Itching    Sulfamethoxazole-trimethoprim Nausea Only, Other (See Comments)   Stomach pain    Tramadol Other (See Comments)   Gives headaches      Medication List    TAKE these medications   acetaminophen 500 MG tablet Commonly known as: TYLENOL Take 2 tablets (1,000 mg total) by mouth every 8 (eight) hours.   ciprofloxacin 500 MG tablet Commonly known as: Cipro Take 1 tablet (500 mg total) by mouth 2 (two) times daily for 5 days.   doxycycline 50 MG capsule Commonly known as: VIBRAMYCIN Take 2 capsules (100 mg total) by mouth 2 (two) times daily for 5 days.   gabapentin 600 MG tablet Commonly known as: NEURONTIN Take 2 tablets (1,200 mg total) by mouth 3 (three) times daily.   hydrOXYzine 25 MG capsule Commonly known as: VISTARIL Take 25 mg by mouth every 4 (four) hours as needed for anxiety.   lithium carbonate 450 MG CR tablet Commonly known as: ESKALITH Take 450 mg by mouth 2 (two) times daily.   OLANZapine 10 MG tablet Commonly known as: ZYPREXA Take 5-10 mg by mouth 2 (two) times daily. 5MG -AM 10MG -PM   oxyCODONE 5 MG immediate release tablet Commonly known as: Oxy IR/ROXICODONE Take 1 tablet (5 mg total) by mouth every 6 (six) hours as needed for severe pain.   tiZANidine 4 MG tablet Commonly known as: ZANAFLEX Take 4 mg by mouth every 6 (six) hours as needed for muscle spasms.        Signed: Sharen Hones 04/28/2020, 1:59 PM

## 2020-04-29 ENCOUNTER — Other Ambulatory Visit: Payer: Self-pay

## 2020-04-29 ENCOUNTER — Inpatient Hospital Stay
Admission: AD | Admit: 2020-04-29 | Discharge: 2020-05-04 | DRG: 885 | Disposition: A | Discharge status: Home / Self Care | Payer: Medicaid Other | Source: Intra-hospital | Attending: Psychiatry | Admitting: Psychiatry

## 2020-04-29 ENCOUNTER — Encounter: Payer: Self-pay | Admitting: Psychiatry

## 2020-04-29 DIAGNOSIS — Z981 Arthrodesis status: Secondary | ICD-10-CM

## 2020-04-29 DIAGNOSIS — F451 Undifferentiated somatoform disorder: Secondary | ICD-10-CM

## 2020-04-29 DIAGNOSIS — M1712 Unilateral primary osteoarthritis, left knee: Secondary | ICD-10-CM | POA: Diagnosis present

## 2020-04-29 DIAGNOSIS — F450 Somatization disorder: Secondary | ICD-10-CM | POA: Diagnosis present

## 2020-04-29 DIAGNOSIS — F315 Bipolar disorder, current episode depressed, severe, with psychotic features: Secondary | ICD-10-CM | POA: Diagnosis present

## 2020-04-29 DIAGNOSIS — G8929 Other chronic pain: Secondary | ICD-10-CM | POA: Diagnosis present

## 2020-04-29 DIAGNOSIS — G4700 Insomnia, unspecified: Secondary | ICD-10-CM | POA: Diagnosis present

## 2020-04-29 DIAGNOSIS — Z8719 Personal history of other diseases of the digestive system: Secondary | ICD-10-CM

## 2020-04-29 DIAGNOSIS — F17210 Nicotine dependence, cigarettes, uncomplicated: Secondary | ICD-10-CM | POA: Diagnosis present

## 2020-04-29 DIAGNOSIS — M5432 Sciatica, left side: Secondary | ICD-10-CM | POA: Diagnosis present

## 2020-04-29 DIAGNOSIS — M19041 Primary osteoarthritis, right hand: Secondary | ICD-10-CM | POA: Diagnosis present

## 2020-04-29 DIAGNOSIS — Z808 Family history of malignant neoplasm of other organs or systems: Secondary | ICD-10-CM

## 2020-04-29 DIAGNOSIS — Z86010 Personal history of colonic polyps: Secondary | ICD-10-CM

## 2020-04-29 DIAGNOSIS — M5431 Sciatica, right side: Secondary | ICD-10-CM | POA: Diagnosis present

## 2020-04-29 DIAGNOSIS — Z885 Allergy status to narcotic agent status: Secondary | ICD-10-CM

## 2020-04-29 DIAGNOSIS — Z79899 Other long term (current) drug therapy: Secondary | ICD-10-CM

## 2020-04-29 DIAGNOSIS — F1010 Alcohol abuse, uncomplicated: Secondary | ICD-10-CM | POA: Diagnosis present

## 2020-04-29 DIAGNOSIS — Z880 Allergy status to penicillin: Secondary | ICD-10-CM

## 2020-04-29 DIAGNOSIS — Z8379 Family history of other diseases of the digestive system: Secondary | ICD-10-CM

## 2020-04-29 DIAGNOSIS — R45851 Suicidal ideations: Secondary | ICD-10-CM | POA: Diagnosis present

## 2020-04-29 DIAGNOSIS — Z800 Family history of malignant neoplasm of digestive organs: Secondary | ICD-10-CM

## 2020-04-29 DIAGNOSIS — Z590 Homelessness: Secondary | ICD-10-CM | POA: Diagnosis not present

## 2020-04-29 DIAGNOSIS — F1110 Opioid abuse, uncomplicated: Secondary | ICD-10-CM | POA: Diagnosis present

## 2020-04-29 DIAGNOSIS — G894 Chronic pain syndrome: Secondary | ICD-10-CM | POA: Diagnosis present

## 2020-04-29 DIAGNOSIS — Z91040 Latex allergy status: Secondary | ICD-10-CM

## 2020-04-29 DIAGNOSIS — M19042 Primary osteoarthritis, left hand: Secondary | ICD-10-CM | POA: Diagnosis present

## 2020-04-29 DIAGNOSIS — T8149XA Infection following a procedure, other surgical site, initial encounter: Secondary | ICD-10-CM | POA: Diagnosis not present

## 2020-04-29 DIAGNOSIS — Z882 Allergy status to sulfonamides status: Secondary | ICD-10-CM

## 2020-04-29 DIAGNOSIS — Z89022 Acquired absence of left finger(s): Secondary | ICD-10-CM | POA: Diagnosis not present

## 2020-04-29 DIAGNOSIS — Z822 Family history of deafness and hearing loss: Secondary | ICD-10-CM

## 2020-04-29 DIAGNOSIS — M47819 Spondylosis without myelopathy or radiculopathy, site unspecified: Secondary | ICD-10-CM | POA: Diagnosis present

## 2020-04-29 DIAGNOSIS — Z96651 Presence of right artificial knee joint: Secondary | ICD-10-CM | POA: Diagnosis present

## 2020-04-29 DIAGNOSIS — Z9089 Acquired absence of other organs: Secondary | ICD-10-CM | POA: Diagnosis not present

## 2020-04-29 DIAGNOSIS — Z8269 Family history of other diseases of the musculoskeletal system and connective tissue: Secondary | ICD-10-CM

## 2020-04-29 DIAGNOSIS — Z91013 Allergy to seafood: Secondary | ICD-10-CM

## 2020-04-29 DIAGNOSIS — F1410 Cocaine abuse, uncomplicated: Secondary | ICD-10-CM

## 2020-04-29 DIAGNOSIS — M549 Dorsalgia, unspecified: Secondary | ICD-10-CM | POA: Diagnosis present

## 2020-04-29 DIAGNOSIS — Z888 Allergy status to other drugs, medicaments and biological substances status: Secondary | ICD-10-CM

## 2020-04-29 DIAGNOSIS — F419 Anxiety disorder, unspecified: Secondary | ICD-10-CM | POA: Diagnosis present

## 2020-04-29 DIAGNOSIS — F29 Unspecified psychosis not due to a substance or known physiological condition: Secondary | ICD-10-CM | POA: Diagnosis present

## 2020-04-29 LAB — CREATININE, SERUM
Creatinine, Ser: 1.06 mg/dL (ref 0.61–1.24)
GFR calc Af Amer: >60 mL/min (ref >60)
GFR calc non Af Amer: >60 mL/min (ref >60)

## 2020-04-29 MED ORDER — MAGNESIUM HYDROXIDE 400 MG/5ML PO SUSP
30.0000 mL | Freq: Every day | ORAL | Status: DC | PRN
  Administered 2020-04-30: 13:00:00 30 mL via ORAL
  Filled 2020-04-29: qty 30

## 2020-04-29 MED ORDER — OLANZAPINE 5 MG PO TABS
5.0000 mg | ORAL_TABLET | Freq: Two times a day (BID) | ORAL | Status: DC
  Administered 2020-04-29 – 2020-05-01 (×4): 5 mg via ORAL
  Filled 2020-04-29 (×4): qty 1

## 2020-04-29 MED ORDER — LITHIUM CARBONATE ER 450 MG PO TBCR
450.0000 mg | EXTENDED_RELEASE_TABLET | Freq: Two times a day (BID) | ORAL | Status: DC
  Administered 2020-04-29 – 2020-05-03 (×8): 450 mg via ORAL
  Filled 2020-04-29 (×8): qty 1

## 2020-04-29 MED ORDER — TIZANIDINE HCL 4 MG PO TABS
4.0000 mg | ORAL_TABLET | Freq: Four times a day (QID) | ORAL | Status: DC | PRN
  Administered 2020-04-29 – 2020-05-04 (×3): 4 mg via ORAL
  Filled 2020-04-29 (×4): qty 1

## 2020-04-29 MED ORDER — CIPROFLOXACIN HCL 500 MG PO TABS
500.0000 mg | ORAL_TABLET | Freq: Two times a day (BID) | ORAL | Status: AC
  Administered 2020-04-29 – 2020-05-04 (×10): 500 mg via ORAL
  Filled 2020-04-29 (×10): qty 1

## 2020-04-29 MED ORDER — GABAPENTIN 400 MG PO CAPS
1200.0000 mg | ORAL_CAPSULE | Freq: Three times a day (TID) | ORAL | Status: DC
  Administered 2020-04-29 – 2020-05-04 (×15): 1200 mg via ORAL
  Filled 2020-04-29 (×15): qty 3

## 2020-04-29 MED ORDER — ALUM & MAG HYDROXIDE-SIMETH 200-200-20 MG/5ML PO SUSP: 30.0000 mL | ORAL | Status: DC | PRN

## 2020-04-29 MED ORDER — DOXYCYCLINE HYCLATE 100 MG PO TABS
100.0000 mg | ORAL_TABLET | Freq: Two times a day (BID) | ORAL | Status: AC
  Administered 2020-04-29 – 2020-05-04 (×10): 100 mg via ORAL
  Filled 2020-04-29 (×10): qty 1

## 2020-04-29 MED ORDER — DOXYCYCLINE HYCLATE 100 MG PO TABS: 100.0000 mg | ORAL_TABLET | Freq: Two times a day (BID) | ORAL | Status: DC

## 2020-04-29 MED ORDER — ACETAMINOPHEN 325 MG PO TABS
650.0000 mg | ORAL_TABLET | Freq: Four times a day (QID) | ORAL | Status: DC | PRN
  Administered 2020-04-30 – 2020-05-03 (×4): 650 mg via ORAL
  Filled 2020-04-29 (×4): qty 2

## 2020-04-29 MED ORDER — CIPROFLOXACIN HCL 500 MG PO TABS: 500.0000 mg | ORAL_TABLET | Freq: Two times a day (BID) | ORAL | Status: DC

## 2020-04-29 MED ORDER — OXYCODONE HCL 5 MG PO TABS
5.0000 mg | ORAL_TABLET | Freq: Four times a day (QID) | ORAL | Status: DC | PRN
  Administered 2020-04-29 – 2020-05-04 (×14): 5 mg via ORAL
  Filled 2020-04-29 (×15): qty 1

## 2020-04-29 NOTE — Progress Notes (Signed)
Johnny [name] Johnny [name]. to be D/C'd to [facility] per MD order.  Discussed prescriptions and follow up appointments with the patient. Prescriptions given to patient, medication list explained in detail. Pt verbalized understanding.  Allergies as of 04/29/2020       Reactions   Crab [shellfish Allergy] Itching, Swelling   Flexeril [cyclobenzaprine] Anaphylaxis   Codeine Itching   Latex Swelling   Gloves(when worn), condoms   Penicillins Nausea And Vomiting   Did it involve swelling of the face/tongue/throat, SOB, or low BP? No Did it involve sudden or severe rash/hives, skin peeling, or any reaction on the inside of your mouth or nose? No Did you need to seek medical attention at a hospital or doctor's office? No When did it last happen?      10+years If all above answers are "NO", may proceed with cephalosporin use.   Robaxin [methocarbamol] Itching   Sulfamethoxazole-trimethoprim Nausea Only, Other (See Comments)   Stomach pain    Tramadol Other (See Comments)   Gives headaches        Medication List     TAKE these medications    acetaminophen 500 MG tablet Commonly known as: TYLENOL Take 2 tablets (1,000 mg total) by mouth every 8 (eight) hours. Notes to patient: Before bed 04/29/20  Do not take more than 4000mg  within a 24 hour period   ciprofloxacin 500 MG tablet Commonly known as: Cipro Take 1 tablet (500 mg total) by mouth 2 (two) times daily for 5 days. Notes to patient: Before bed 04/29/20   doxycycline 50 MG capsule Commonly known as: VIBRAMYCIN Take 2 capsules (100 mg total) by mouth 2 (two) times daily for 5 days. Notes to patient: Before bed 04/29/20   gabapentin 600 MG tablet Commonly known as: NEURONTIN Take 2 tablets (1,200 mg total) by mouth 3 (three) times daily. Notes to patient: Before bed 04/29/20   hydrOXYzine 25 MG capsule Commonly known as: VISTARIL Take 25 mg by mouth every 4 (four) hours as needed for anxiety. Notes to patient: As needed   lithium  carbonate 450 MG CR tablet Commonly known as: ESKALITH Take 450 mg by mouth 2 (two) times daily. Notes to patient: Before bed 04/29/20   OLANZapine 10 MG tablet Commonly known as: ZYPREXA Take 5-10 mg by mouth 2 (two) times daily. 5MG -AM 10MG -PM Notes to patient: Before bed 04/30/20   oxyCODONE 5 MG immediate release tablet Commonly known as: Oxy IR/ROXICODONE Take 1 tablet (5 mg total) by mouth every 6 (six) hours as needed for severe pain. Notes to patient: As needed   tiZANidine 4 MG tablet Commonly known as: ZANAFLEX Take 4 mg by mouth every 6 (six) hours as needed for muscle spasms. Notes to patient: As needed        Vitals:   04/28/20 2334 04/29/20 1222  BP: 116/83 98/72  Pulse: 83 81  Resp: 17 16  Temp: 98.3 F (36.8 C) 98.5 F (36.9 C)  SpO2: 96% 96%    Skin clean, dry and intact without evidence of skin break down, no evidence of skin tears noted. IV catheter discontinued intact. Site without signs and symptoms of complications. Dressing and pressure applied. Pt denies pain at this time. No complaints noted.  An After Visit Summary was printed and given to the patient. Patient escorted via [place], and D/C home via private auto.  Johnny [name]

## 2020-04-29 NOTE — Discharge Summary (Signed)
Physician Discharge Summary  Patient ID: Bion Borland. MRN: [id number] DOB/AGE: 09-21-1971 49 y.o.  Admit date: 04/24/2020 Discharge date: 04/29/2020  Admission Diagnoses:  Discharge Diagnoses:  Principal Problem:   Surgical wound infection Active Problems:   Chronic back pain   Abdominal wall cellulitis   Cellulitis, abdominal wall   Bipolar affective disorder, depressed, severe, with psychotic behavior [facility])   Discharged Condition: good  Hospital Course:  Mateen Vanzeeland Jr.is an 49 y.o.malewith medical history significant forchronic back pain who is status post recent umbilical hernia repair on 04/23/2020 who presented to the emergency room initially on 04/24/2020 with a complaint of pain at the surgical site after reaching to pick up a gallon jug of water. He subsequently noted bruising on the area. Surgery was consulted and he was thought to have a mild contusion at the site secondary to twisting. There were no signs of infection or wound dehiscence. While in the emergency room, patient voiced suicidal ideation and he was kept in the ER under observation by behavioral health with plan to discharge on 04/25/2020. They did not think that he was depressed or actively suicidal. On 04/26/2020, during his period of observation he started having increased abdominal discomfort and he was reevaluated by the emergency room provider with concern for expanding area of redness on the abdomen. Blood work revealed elevated white cell count of 15,000 but was otherwise unremarkable. Dr. [name], surgeon was again consulted, note pending, and requested that patient be admitted to the hospitalist service.  Patient was given IV antibiotics with Rocephin and vancomycin.  I examined the patient today, noted 2 wounds, one at periumbilical area, with small amount draining.  But no secondary redness anymore.  Another one at left inguinal area, no significant redness.  At this point, it is  reasonable to change to oral antibiotics.  I will cover with doxycycline and Cipro for additional 7 days.  Patient will need to follow-up with his family doctor in 1 week time.  Patient is transferred to inpatient inpatient psychiatry for suicidal thoughts.  Patient instructions:49 #1.  Complete antibiotics. 2.  Follow-up with your family doctor in 1 week time.  Patient was not able to transfer to psychiatry yesterday.  Bed is available today, he will be transferred.  Condition much improved today, continue antibiotics as above.   Consults: psychiatry  Significant Diagnostic Studies:   Treatments: Antibiotics as above  Discharge Exam: Blood pressure 98/72, pulse 81, temperature 98.5 F (36.9 C), temperature source Oral, resp. rate 16, height 6' (1.829 m), weight 92.5 kg, SpO2 96 %. General appearance: alert and cooperative Resp: clear to auscultation bilaterally Cardio: regular rate and rhythm, S1, S2 normal, no murmur, click, rub or gallop GI: soft, non-tender; bowel sounds normal; no masses,  no organomegaly Extremities: extremities normal, atraumatic, no cyanosis or edema   Abdominal wall wounds much improved  Disposition: Discharge disposition: 93-DC/txfr to psych unit of hospital with planned acute hosp IP readmission       Discharge Instructions    Diet - low sodium heart healthy   Complete by: As directed    Increase activity slowly   Complete by: As directed    Increase activity slowly   Complete by: As directed      Allergies as of 04/29/2020      Reactions   Crab [shellfish Allergy] Itching, Swelling   Flexeril [cyclobenzaprine] Anaphylaxis   Codeine Itching   Latex Swelling   Gloves(when worn), condoms   Penicillins Nausea And  Vomiting   Did it involve swelling of the face/tongue/throat, SOB, or low BP? No Did it involve sudden or severe rash/hives, skin peeling, or any reaction on the inside of your mouth or nose? No Did you need to seek medical attention  at a hospital or doctor's office? No When did it last happen?10+years If all above answers are "NO", may proceed with cephalosporin use.   Robaxin [methocarbamol] Itching   Sulfamethoxazole-trimethoprim Nausea Only, Other (See Comments)   Stomach pain    Tramadol Other (See Comments)   Gives headaches      Medication List    TAKE these medications   acetaminophen 500 MG tablet Commonly known as: TYLENOL Take 2 tablets (1,000 mg total) by mouth every 8 (eight) hours. Notes to patient: Before bed 04/29/20  Do not take more than 4000mg  within a 24 hour period   ciprofloxacin 500 MG tablet Commonly known as: Cipro Take 1 tablet (500 mg total) by mouth 2 (two) times daily for 5 days. Notes to patient: Before bed 04/29/20   doxycycline 50 MG capsule Commonly known as: VIBRAMYCIN Take 2 capsules (100 mg total) by mouth 2 (two) times daily for 5 days. Notes to patient: Before bed 04/29/20   gabapentin 600 MG tablet Commonly known as: NEURONTIN Take 2 tablets (1,200 mg total) by mouth 3 (three) times daily. Notes to patient: Before bed 04/29/20   hydrOXYzine 25 MG capsule Commonly known as: VISTARIL Take 25 mg by mouth every 4 (four) hours as needed for anxiety. Notes to patient: As needed   lithium carbonate 450 MG CR tablet Commonly known as: ESKALITH Take 450 mg by mouth 2 (two) times daily. Notes to patient: Before bed 04/29/20   OLANZapine 10 MG tablet Commonly known as: ZYPREXA Take 5-10 mg by mouth 2 (two) times daily. 5MG -AM 10MG -PM Notes to patient: Before bed 04/30/20   oxyCODONE 5 MG immediate release tablet Commonly known as: Oxy IR/ROXICODONE Take 1 tablet (5 mg total) by mouth every 6 (six) hours as needed for severe pain. Notes to patient: As needed   tiZANidine 4 MG tablet Commonly known as: ZANAFLEX Take 4 mg by mouth every 6 (six) hours as needed for muscle spasms. Notes to patient: As needed      Follow-up Information    Park Liter P, DO. Go on  05/06/2020.   Specialty: Family Medicine Why: 2:15pm appointment Contact information: [place] [address] [phone]        [name], MD. Go on 05/13/2020.   Specialty: General Surgery Why: May 20th at 9:30am for a follow-up appointment  Contact information: [address] [place] [address] [phone]           Signed: Sharen Hones 04/29/2020, 3:43 PM

## 2020-04-29 NOTE — Consult Note (Signed)
I saw the patient on the inpatient unit and reviewed his care with nursing staff.  He has shown some improvement although he still has significant confusion.  He continues to request admission to the inpatient psychiatric unit and is interested in completing the care of his depression on the inpatient psychiatric unit.  The orders have been written and he will be transferred today.

## 2020-04-29 NOTE — Progress Notes (Signed)
Admission Note:  The patient arrived to the unit from medical, accompanied by security.  Belongings were secured, documents were signed, and Room 304 was provided.  The patient made a request for pain medication and was instructed that orders would be considered after the patient met with his provider.  Johnny [name] voiced his discontent and threatened to "make a scene" if he did not receive pain medication in a timely manner.  The patient stated that he would like help controlling paranoia.  Johnny [name] is also in need of housing.  A skin check revealed recent surgical scars.  15-minute safety checks initiated.

## 2020-04-29 NOTE — [facility] Group Notes (Signed)
[place] Group Notes:  (Nursing/MHT/Case Management/Adjunct)  Date:  04/29/2020  Time:  8:48 PM  Type of Therapy:  Group Therapy  Participation Level:  Active  Participation Quality:  Appropriate and He said had no goal just got here.  Affect:  Appropriate  Cognitive:  Alert  Insight:  Good  Engagement in Group:  Engaged  Modes of Intervention:  Support  Summary of Progress/Problems:  Johnny [name] 04/29/2020, 8:48 PM

## 2020-04-29 NOTE — Progress Notes (Signed)
The patient is requesting an increase in his oxycodone to 10mg .

## 2020-04-29 NOTE — Discharge Instructions (Signed)
Suicidal Feelings: How to Help Yourself Suicide is when you end your own life. There are many things you can do to help yourself feel better when struggling with these feelings. Many services and people are available to support you and others who struggle with similar feelings.  If you ever feel like you may hurt yourself or others, or have thoughts about taking your own life, get help right away. To get help:  Call your local emergency services (911 in the [place]  [employer]'s health and human services helpline (211 in the [place]  Go to your nearest emergency department.  Call a suicide hotline to speak with a trained counselor. The following suicide hotlines are available in the [place]: ? 1-800-273-TALK (1-800-273-8255). ? 1-800-SUICIDE (1-800-784-2433). ? 1-888-628-9454. This is a hotline for Spanish speakers. ? 1-800-799-4889. This is a hotline for TTY users. ? 1-866-4-U-TREVOR (1-866-488-7386). This is a hotline for lesbian, gay, bisexual, transgender, or questioning youth. ? For a list of hotlines in [place], visit www.suicide.org/hotlines/international/[place]-suicide-hotlines.html  Contact a crisis center or a local suicide prevention center. To find a crisis center or suicide prevention center: ? Call your local hospital, clinic, community service organization, mental health center, social service provider, or health department. Ask for help with connecting to a crisis center. ? For a list of crisis centers in the [place], visit: suicidepreventionlifeline.org ? For a list of crisis centers in [place], visit: suicideprevention.ca How to help yourself feel better   Promise yourself that you will not do anything extreme when you have suicidal feelings. Remember, there is hope. Many people have gotten through suicidal thoughts and feelings, and you can too. If you have had these feelings before, remind yourself that you can get through them again.  Let family, friends,  teachers, or counselors know how you are feeling. Try not to separate yourself from those who care about you and want to help you. Talk with someone every day, even if you do not feel sociable. Face-to-face conversation is best to help them understand your feelings.  Contact a mental health care provider and work with this person regularly.  Make a safety plan that you can follow during a crisis. Include phone numbers of suicide prevention hotlines, mental health professionals, and trusted friends and family members you can call during an emergency. Save these numbers on your phone.  If you are thinking of taking a lot of medicine, give your medicine to someone who can give it to you as prescribed. If you are on antidepressants and are concerned you will overdose, tell your health care provider so that he or she can give you safer medicines.  Try to stick to your routines. Follow a schedule every day. Make self-care a priority.  Make a list of realistic goals, and cross them off when you achieve them. Accomplishments can give you a sense of worth.  Wait until you are feeling better before doing things that you find difficult or unpleasant.  Do things that you have always enjoyed to take your mind off your feelings. Try reading a book, or listening to or playing music. Spending time outside, in nature, may help you feel better. Follow these instructions at home:   Visit your primary health care provider every year for a checkup.  Work with a mental health care provider as needed.  Eat a well-balanced diet, and eat regular meals.  Get plenty of rest.  Exercise if you are able. Just 30 minutes of exercise each day can   help you feel better.  Take over-the-counter and prescription medicines only as told by your health care provider. Ask your mental health care provider about the possible side effects of any medicines you are taking.  Do not use alcohol or drugs, and remove these substances  from your home.  Remove weapons, poisons, knives, and other deadly items from your home. General recommendations  Keep your living space well lit.  When you are feeling well, write yourself a letter with tips and support that you can read when you are not feeling well.  Remember that life's difficulties can be sorted out with help. Conditions can be treated, and you can learn behaviors and ways of thinking that will help you. Where to find more information  National Suicide Prevention Lifeline: www.suicidepreventionlifeline.org  Hopeline: www.hopeline.com  American Foundation for Suicide Prevention: www.afsp.org  The Trevor Project (for lesbian, gay, bisexual, transgender, or questioning youth): www.thetrevorproject.org Contact a health care provider if:  You feel as though you are a burden to others.  You feel agitated, angry, vengeful, or have extreme mood swings.  You have withdrawn from family and friends. Get help right away if:  You are talking about suicide or wishing to die.  You start making plans for how to commit suicide.  You feel that you have no reason to live.  You start making plans for putting your affairs in order, saying goodbye, or giving your possessions away.  You feel guilt, shame, or unbearable pain, and it seems like there is no way out.  You are frequently using drugs or alcohol.  You are engaging in risky behaviors that could lead to death. If you have any of these symptoms, get help right away. Call emergency services, go to your nearest emergency department or crisis center, or call a suicide crisis helpline. Summary  Suicide is when you take your own life.  Promise yourself that you will not do anything extreme when you have suicidal feelings.  Let family, friends, teachers, or counselors know how you are feeling.  Get help right away if you feel as though life is getting too tough to handle and you are thinking about suicide. This  information is not intended to replace advice given to you by your health care provider. Make sure you discuss any questions you have with your health care provider. Document Revised: 04/03/2019 Document Reviewed: 07/24/2017 Elsevier Patient Education  2020 Elsevier Inc.  

## 2020-04-29 NOTE — Tx Team (Signed)
Initial Treatment Plan 04/29/2020 5:25 PM Johnny [name] Malka So. [id number]    PATIENT STRESSORS: Financial difficulties Health problems Substance abuse   PATIENT STRENGTHS: Average or above average intelligence Capable of independent living   PATIENT IDENTIFIED PROBLEMS: HOMELESSNESS  PAIN MANAGEMENT  DELUSIONS                 DISCHARGE CRITERIA:  Ability to meet basic life and health needs Adequate post-discharge living arrangements Improved stabilization in mood, thinking, and/or behavior  PRELIMINARY DISCHARGE PLAN: Outpatient therapy  PATIENT/FAMILY INVOLVEMENT: This treatment plan has been presented to and reviewed with the patient.  The patient and family have been given the opportunity to ask questions and make suggestions.  Ronal Fear, RN 04/29/2020, 5:25 PM

## 2020-04-29 NOTE — BH Assessment (Signed)
Patient is to be admitted to [facility] by Dr. Weber Cooks.  Attending Physician will be. Alesia Morin, MD.   Patient has been assigned to room 301, by [place], RN.   Intake Paper Work has been signed and placed on patient chart.  ER staff is aware of the admission: 1. Leda Gauze, ER Secretary  2. Roosevelt Locks, ER MD  3. [place] Patient's Nurse  4. [facility] Patient Access.

## 2020-04-30 DIAGNOSIS — Z9889 Other specified postprocedural states: Secondary | ICD-10-CM

## 2020-04-30 DIAGNOSIS — F1410 Cocaine abuse, uncomplicated: Secondary | ICD-10-CM

## 2020-04-30 DIAGNOSIS — Z8719 Personal history of other diseases of the digestive system: Secondary | ICD-10-CM

## 2020-04-30 DIAGNOSIS — F315 Bipolar disorder, current episode depressed, severe, with psychotic features: Principal | ICD-10-CM

## 2020-04-30 DIAGNOSIS — F451 Undifferentiated somatoform disorder: Secondary | ICD-10-CM

## 2020-04-30 LAB — LIPID PANEL
Cholesterol: 137 mg/dL (ref 0–200)
HDL: 25 mg/dL — ABNORMAL LOW (ref >40)
LDL Cholesterol: 91 mg/dL (ref 0–99)
Total CHOL/HDL Ratio: 5.5 RATIO
Triglycerides: 104 mg/dL (ref <150)
VLDL: 21 mg/dL (ref 0–40)

## 2020-04-30 MED ORDER — HYDROXYZINE HCL 50 MG PO TABS
50.0000 mg | ORAL_TABLET | Freq: Four times a day (QID) | ORAL | Status: DC | PRN
  Administered 2020-04-30 – 2020-05-02 (×4): 50 mg via ORAL
  Filled 2020-04-30 (×4): qty 1

## 2020-04-30 MED ORDER — NICOTINE 21 MG/24HR TD PT24
21.0000 mg | MEDICATED_PATCH | Freq: Every day | TRANSDERMAL | Status: DC
  Administered 2020-05-01 – 2020-05-02 (×2): 21 mg via TRANSDERMAL
  Filled 2020-04-30 (×2): qty 1

## 2020-04-30 NOTE — BHH Group Notes (Signed)
Balance In Life 04/30/2020 1PM  Type of Therapy/Topic:  Group Therapy:  Balance in Life  Participation Level:  Active  Description of Group:   This group will address the concept of balance and how it feels and looks when one is unbalanced. Patients will be encouraged to process areas in their lives that are out of balance and identify reasons for remaining unbalanced. Facilitators will guide patients in utilizing problem-solving interventions to address and correct the stressor making their life unbalanced. Understanding and applying boundaries will be explored and addressed for obtaining and maintaining a balanced life. Patients will be encouraged to explore ways to assertively make their unbalanced needs known to significant others in their lives, using other group members and facilitator for support and feedback.  Therapeutic Goals: 1. Patient will identify two or more emotions or situations they have that consume much of in their lives. 2. Patient will identify signs/triggers that life has become out of balance:  3. Patient will identify two ways to set boundaries in order to achieve balance in their lives:  4. Patient will demonstrate ability to communicate their needs through discussion and/or role plays  Summary of Patient Progress: Actively and appropriately engaged in the group. Patient was able to provide support and validation to other group members.patient discussed with group not having a support system. Members identified people who can be a part of his support system. Patient was receptive to this feedback.    Therapeutic Modalities:   Cognitive Behavioral Therapy Solution-Focused Therapy Assertiveness Training  Johnny [name] Lynelle Smoke, LCSW

## 2020-04-30 NOTE — Progress Notes (Signed)
Patient alert and oriented x 3 no distress noted, affect is blunted, interacting appropriately with peers and staff, complaint with medication regimen, denies SI/HI/AVH, 15 minutes safety checks maintained will continue to monitor.

## 2020-04-30 NOTE — Plan of Care (Signed)
Patient stated that sleeplessness and paranoia  is his main stress that makes him anxious and irritable all the time.Verbalized that he could sleep better last night and denies SI,HI and AVH at this time.Patent is appropriate in the unit.No irritable behaviors noted.Patient is taking PRN medication for pain.Appetite and energy level good.Attended group.Support and encouragement given.

## 2020-04-30 NOTE — BHH Group Notes (Signed)
[place] Group Notes:  (Nursing/MHT/Case Management/Adjunct)  Date:  04/30/2020  Time:  3:19 PM  Type of Therapy:  Music Group  Participation Level:  Active  Participation Quality:  Appropriate  Affect:  Appropriate  Cognitive:  Appropriate  Insight:  Appropriate  Engagement in Group:  Engaged  Modes of Intervention:  Exploration and Socialization  Summary of Progress/Problems:  Charna Busman 04/30/2020, 3:19 PM

## 2020-04-30 NOTE — H&P (Signed)
Psychiatric Admission Assessment Adult  Patient Identification: Johnny [name]. MRN:  [id number] Date of Evaluation:  04/30/2020 Chief Complaint:  Bipolar affective disorder, depressed, severe, with psychotic behavior ([place]) [F31.5] Principal Diagnosis: Bipolar affective disorder, depressed, severe, with psychotic behavior ([place]) Diagnosis:  Principal Problem:   Bipolar affective disorder, depressed, severe, with psychotic behavior ([place]) Active Problems:   Chronic back pain   Somatic symptom disorder   H/O hernia repair   Cocaine abuse ([place])  History of Present Illness: Patient seen and chart reviewed.  This is a 49 year old man transferred to our unit from the medical service upstairs.  He had been there for the past week being treated for a surgical site infection.  During his hospitalization he was seen by psychiatry who felt that he was still having problems with mood anxiety and psychotic symptoms.  On interview today the patient describes his problem as "my paranoia got to me".  He reports that for the past month or 2 he had been feeling more paranoid often feeling like people were out to get him more were about to attack him or that they were trying to break into his [name].  His sleep had been quite impaired with difficulty both falling asleep and staying asleep.  Mood had been feeling more dysphoric and angry and irritable.  He particularly notices that irritability having become a problem.  Patient admits that during that time he was using crack cocaine fairly regularly.  Also still drinks occasionally.  Denies other drug abuse.  He came to the hospital emergency room at the beginning of April with psychiatric symptoms and was sent to [facility].  On discharge there he was on lithium and Zyprexa suggesting they must of thought he had bipolar disorder.  Patient says he thought the medicine was perhaps partially effective.  He still complains of visual hallucinations saying that he sees things  out of the corner of his eyes.  Does not report any acute suicidal intent or plan or homicidal ideation.  Since being on the unit he has been focused and concerned on his pain and other medical problems. Associated Signs/Symptoms: Depression Symptoms:  depressed mood, anhedonia, difficulty concentrating, suicidal thoughts without plan, anxiety, (Hypo) Manic Symptoms:  Distractibility, Hallucinations, Impulsivity, Anxiety Symptoms:  Excessive Worry, Psychotic Symptoms:  Hallucinations: Auditory Visual Both auditory and visual hallucinations of an atypical sort Paranoia, PTSD Symptoms: Negative Total Time spent with patient: 1 hour  Past Psychiatric History: Apparently he had never had psychiatric hospitalization prior to going to [facility] last month.  Denies before that had varying ever seen a psychiatrist or mental health provider although he says that there was a time in the past when he used to cut himself frequently.  Denies any prior diagnosis with bipolar disorder or psychotic disorder.  Patient has an extensive history of substance abuse problems stating that for years he drank very heavily and only cut back about 10 years ago although he says he still drinks several times a week.  He has been using crack cocaine recently.  Patient has a long and quite striking history of opiate use.  Reviewing his presentations mostly to [place] for medical problems it is remarkable the variety and number of injuries he had with attempts to get narcotics.  Review of the controlled substance database shows multiple prescriptions coming from multiple providers for short courses of opiate medicine.  Despite this it does not seem like anyone has ever diagnosed him with opiate abuse although  reading between the lines of some of the old notes it certainly looks like they were concerned about his use of narcotics at times.  Patient denies ever having seriously tried to kill himself.  Is the patient at  risk to self? Yes.    Has the patient been a risk to self in the past 6 months? Yes.    Has the patient been a risk to self within the distant past? No.  Is the patient a risk to others? No.  Has the patient been a risk to others in the past 6 months? No.  Has the patient been a risk to others within the distant past? No.   Prior Inpatient Therapy:   Prior Outpatient Therapy:    Alcohol Screening: 1. How often do you have a drink containing alcohol?: 2 to 4 times a month 2. How many drinks containing alcohol do you have on a typical day when you are drinking?: 3 or 4 3. How often do you have six or more drinks on one occasion?: Less than monthly AUDIT-C Score: 4 4. How often during the last year have you found that you were not able to stop drinking once you had started?: Never 5. How often during the last year have you failed to do what was normally expected from you becasue of drinking?: Never 6. How often during the last year have you needed a first drink in the morning to get yourself going after a heavy drinking session?: Never 7. How often during the last year have you had a feeling of guilt of remorse after drinking?: Never 8. How often during the last year have you been unable to remember what happened the night before because you had been drinking?: Never 9. Have you or someone else been injured as a result of your drinking?: No 10. Has a relative or friend or a doctor or another health worker been concerned about your drinking or suggested you cut down?: No Alcohol Use Disorder Identification Test Final Score (AUDIT): 4 Alcohol Brief Interventions/Follow-up: Alcohol Education Substance Abuse History in the last 12 months:  Yes.   Consequences of Substance Abuse: Somewhat unclear particularly given the lack of insight about opiate use Previous Psychotropic Medications: Yes  Psychological Evaluations: Yes  Past Medical History:  Past Medical History:  Diagnosis Date  .  Arthritis    hands, knees, lower back  . Bilateral sciatica   . Carpal tunnel syndrome   . Chronic back pain   . Chronic neck pain   . Chronic pain 03/12/2020   has been connected to pain management clinics. currently waiting to see psychiatrist before he can join [facility]  . Chronic pain syndrome   . GERD (gastroesophageal reflux disease)   . Hand joint pain 07/02/2013  . Herniated disc   . Lives in homeless shelter 02/2020   housing ends as of 03/25/20. going to hotel after surgery  . Traumatic amputation of left index finger 2013  . Wears dentures    full upper and lower    Past Surgical History:  Procedure Laterality Date  . AMPUTATION FINGER / THUMB Left 2013  . COLONOSCOPY WITH PROPOFOL N/A 01/03/2018   Procedure: COLONOSCOPY WITH PROPOFOL;  Surgeon: [name], MD;  Location: [place];  Service: Endoscopy;  Laterality: N/A;  . ESOPHAGOGASTRODUODENOSCOPY (EGD) WITH PROPOFOL N/A 01/03/2018   Procedure: ESOPHAGOGASTRODUODENOSCOPY (EGD) WITH PROPOFOL;  Surgeon: [name], MD;  Location: [place];  Service: Endoscopy;  Laterality: N/A;  .  HERNIA REPAIR Right    right inguinal hernia repaired twice  . HIP SURGERY  2006   took bone out of hip to put into neck  . INGUINAL HERNIA REPAIR Left 04/23/2020   Procedure: HERNIA REPAIR INGUINAL ADULT;  Surgeon: [name], MD;  Location: [facility];  Service: General;  Laterality: Left;  . JOINT REPLACEMENT Right    knee  . KNEE SURGERY Right   . LUMBAR LAMINECTOMY/DECOMPRESSION MICRODISCECTOMY N/A 11/18/2019   Procedure: LUMBAR FOUR-FIVE LUMBAR LAMINECTOMY/DECOMPRESSION MICRODISCECTOMY;  Surgeon: [name], MD;  Location: [place];  Service: Neurosurgery;  Laterality: N/A;  . LUMBAR WOUND DEBRIDEMENT N/A 12/06/2019   Procedure: LUMBAR WOUND REVISION;  Surgeon: [name], MD;  Location: [place];  Service: Neurosurgery;  Laterality: N/A;  . neck fusion    . POLYPECTOMY N/A 01/03/2018   Procedure:  POLYPECTOMY;  Surgeon: [name], MD;  Location: [place];  Service: Endoscopy;  Laterality: N/A;  . REPAIR OF CEREBROSPINAL FLUID LEAK N/A 11/28/2019   Procedure: REPAIR OF CEREBROSPINAL FLUID LEAK/LUMBAR;  Surgeon: [name], MD;  Location: [place];  Service: Neurosurgery;  Laterality: N/A;  REPAIR OF CEREBROSPINAL FLUID LEAK/LUMBAR  . REPLACEMENT TOTAL KNEE Right   . SHOULDER SURGERY Right 2016 X 2  . SPINAL FUSION    . TONSILLECTOMY    . UMBILICAL HERNIA REPAIR N/A 11/07/2019   Procedure: HERNIA REPAIR UMBILICAL ADULT;  Surgeon: [name], MD;  Location: [facility];  Service: General;  Laterality: N/A;  . UMBILICAL HERNIA REPAIR N/A 04/23/2020   Procedure: HERNIA REPAIR UMBILICAL ADULT;  Surgeon: [name], MD;  Location: [facility];  Service: General;  Laterality: N/A;  . WOUND EXPLORATION N/A 12/15/2019   Procedure: LUMBAR WOUND EXPLORATION;  Surgeon: [name], MD;  Location: [place];  Service: Neurosurgery;  Laterality: N/A;  LUMBAR WOUND EXPLORATION   Family History:  Family History  Problem Relation Age of Onset  . Cancer Father        sarcoma  . Cirrhosis Paternal Grandmother   . Cancer Paternal Grandfather        Pancreatic  . Fibromyalgia Sister   . Deafness Sister    Family Psychiatric  History: Denies any Tobacco Screening:   Social History:  Social History   Substance and Sexual Activity  Alcohol Use No     Social History   Substance and Sexual Activity  Drug Use No    Additional Social History: Marital status: Divorced Divorced, when?: "25 years ago" What types of issues is patient dealing with in the relationship?: "my ex cheated" Are you sexually active?: Yes What is your sexual orientation?: Heterosexual Has your sexual activity been affected by drugs, alcohol, medication, or emotional stress?: "just dont think about it as much" Does patient have children?: Yes How many children?: 2 How is patient's relationship with their  children?: "don't get to see them"                         Allergies:   Allergies  Allergen Reactions  . Crab [Shellfish Allergy] Itching and Swelling  . Flexeril [Cyclobenzaprine] Anaphylaxis  . Codeine Itching  . Latex Swelling    Gloves(when worn), condoms  . Penicillins Nausea And Vomiting    Did it involve swelling of the face/tongue/throat, SOB, or low BP? No Did it involve sudden or severe rash/hives, skin peeling, or any reaction on the inside of your mouth or nose? No Did you need to seek medical  attention at a hospital or doctor's office? No When did it last happen?10+years If all above answers are "NO", may proceed with cephalosporin use.    . Robaxin [Methocarbamol] Itching  . Sulfamethoxazole-Trimethoprim Nausea Only and Other (See Comments)    Stomach pain   . Tramadol Other (See Comments)    Gives headaches   Lab Results:  Results for orders placed or performed during the hospital encounter of 04/24/20 (from the past 48 hour(s))  Creatinine, serum     Status: None   Collection Time: 04/29/20  5:47 AM  Result Value Ref Range   Creatinine, Ser 1.06 0.61 - 1.24 mg/dL   GFR calc non Af Amer >60 >60 mL/min   GFR calc Af Amer >60 >60 mL/min    Comment: Performed at [facility], [address] [place], [place] [address]    Blood Alcohol level:  Lab Results  Component Value Date   ETH <10 04/24/2020   ETH <10 [id number]    Metabolic Disorder Labs:  No results found for: HGBA1C, MPG No results found for: PROLACTIN Lab Results  Component Value Date   CHOL 145 06/19/2017   TRIG 64 06/19/2017   HDL 65 06/19/2017   LDLCALC 67 06/19/2017    Current Medications: Current Facility-Administered Medications  Medication Dose Route Frequency Provider Last Rate Last Admin  . acetaminophen (TYLENOL) tablet 650 mg  650 mg Oral Q6H PRN Alesia Morin, MD      . alum & mag hydroxide-simeth (MAALOX/MYLANTA) 200-200-20 MG/5ML suspension 30 mL   30 mL Oral Q4H PRN Alesia Morin, MD      . ciprofloxacin (CIPRO) tablet 500 mg  500 mg Oral BID Raidon Swanner, Madie Reno, MD   500 mg at 04/30/20 0755  . doxycycline (VIBRA-TABS) tablet 100 mg  100 mg Oral Q12H Kohei Antonellis, Madie Reno, MD   100 mg at 04/30/20 0755  . gabapentin (NEURONTIN) capsule 1,200 mg  1,200 mg Oral TID Gaelyn Tukes T, MD   1,200 mg at 04/30/20 1225  . lithium carbonate (ESKALITH) CR tablet 450 mg  450 mg Oral Q12H Madina Galati, Madie Reno, MD   450 mg at 04/30/20 0755  . magnesium hydroxide (MILK OF MAGNESIA) suspension 30 mL  30 mL Oral Daily PRN Alesia Morin, MD   30 mL at 04/30/20 1230  . OLANZapine (ZYPREXA) tablet 5 mg  5 mg Oral BID Aanchal Cope, Madie Reno, MD   5 mg at 04/30/20 0755  . oxyCODONE (Oxy IR/ROXICODONE) immediate release tablet 5 mg  5 mg Oral Q6H PRN Wynnie Pacetti, Madie Reno, MD   5 mg at 04/30/20 1225  . tiZANidine (ZANAFLEX) tablet 4 mg  4 mg Oral Q6H PRN Elli Groesbeck, Madie Reno, MD   4 mg at 04/29/20 2108   PTA Medications: Medications Prior to Admission  Medication Sig Dispense Refill Last Dose  . acetaminophen (TYLENOL) 500 MG tablet Take 2 tablets (1,000 mg total) by mouth every 8 (eight) hours. 180 tablet 0   . ciprofloxacin (CIPRO) 500 MG tablet Take 1 tablet (500 mg total) by mouth 2 (two) times daily for 5 days. 10 tablet 0   . doxycycline (VIBRAMYCIN) 50 MG capsule Take 2 capsules (100 mg total) by mouth 2 (two) times daily for 5 days. 20 capsule 0   . gabapentin (NEURONTIN) 600 MG tablet Take 2 tablets (1,200 mg total) by mouth 3 (three) times daily. 180 tablet 6   . hydrOXYzine (VISTARIL) 25 MG capsule Take 25 mg by mouth every 4 (four) hours as  needed for anxiety.     Marland Kitchen lithium carbonate (ESKALITH) 450 MG CR tablet Take 450 mg by mouth 2 (two) times daily.     Marland Kitchen OLANZapine (ZYPREXA) 10 MG tablet Take 5-10 mg by mouth 2 (two) times daily. 5MG -AM 10MG -PM     . oxyCODONE (OXY IR/ROXICODONE) 5 MG immediate release tablet Take 1 tablet (5 mg total) by mouth every 6 (six) hours as needed  for severe pain. (Patient not taking: Reported on 04/26/2020) 15 tablet 0   . tiZANidine (ZANAFLEX) 4 MG tablet Take 4 mg by mouth every 6 (six) hours as needed for muscle spasms.       Musculoskeletal: Strength & Muscle Tone: within normal limits Gait & Station: normal Patient leans: N/A  Psychiatric Specialty Exam: Physical Exam  Nursing note and vitals reviewed. Constitutional: He appears well-developed and well-nourished.  HENT:  Head: Normocephalic and atraumatic.  Eyes: Pupils are equal, round, and reactive to light. Conjunctivae are normal.  Cardiovascular: Regular rhythm and normal heart sounds.  Respiratory: Effort normal. No respiratory distress.  GI: Soft.  Musculoskeletal:        General: Normal range of motion.     Cervical back: Normal range of motion.  Neurological: He is alert.  Skin: Skin is warm and dry.  Psychiatric: His speech is normal and behavior is normal. Judgment normal. His affect is blunt. Thought content is paranoid. Cognition and memory are normal. He expresses no homicidal and no suicidal ideation.    Review of Systems  Constitutional: Negative.   HENT: Negative.   Eyes: Negative.   Respiratory: Negative.   Cardiovascular: Negative.   Gastrointestinal: Negative.   Musculoskeletal: Negative.   Skin: Negative.   Neurological: Negative.   Psychiatric/Behavioral: Positive for dysphoric mood, hallucinations and sleep disturbance. Negative for suicidal ideas. The patient is nervous/anxious.     Blood pressure 139/89, pulse 89, temperature 98 F (36.7 C), temperature source Oral, resp. rate 18, height 6' (1.829 m), weight 90 kg, SpO2 98 %.Body mass index is 26.91 kg/m.  General Appearance: Casual  Eye Contact:  Good  Speech:  Clear and Coherent  Volume:  Normal  Mood:  Dysphoric  Affect:  Congruent  Thought Process:  Coherent  Orientation:  Full (Time, Place, and Person)  Thought Content:  Logical and Hallucinations: Auditory Visual  Suicidal  Thoughts:  Yes.  without intent/plan  Homicidal Thoughts:  No  Memory:  Immediate;   Fair Recent;   Fair Remote;   Fair  Judgement:  Impaired  Insight:  Shallow  Psychomotor Activity:  Normal  Concentration:  Concentration: Fair  Recall:  [employer] of Knowledge:  Fair  Language:  Fair  Akathisia:  Negative  Handed:  Right  AIMS (if indicated):     Assets:  Desire for Improvement  ADL's:  Intact  Cognition:  WNL  Sleep:  Number of Hours: 7.5    Treatment Plan Summary: Daily contact with patient to assess and evaluate symptoms and progress in treatment, Medication management and Plan 49 year old man with a variety of complaints.  Not entirely clear to me to what degree he still requires inpatient treatment.  A lot of his complaints are rather vague.  Still talks about having hallucinations but he does not look like he is responding to internal stimuli.  He discusses paranoia but in an objective way that suggests that he is not really delusional.  Talks about his mood being bad and having had suicidal thoughts but does not have any current  intent or plan and is able to articulate plans for the future.  Preoccupied with medical issues especially pain.  What we do know about his past history is that he has a long history of excessive use of narcotic pain medicine and also recent cocaine use and a history of alcohol abuse.  He is currently living in his [name] and seems to have a stressful situation without much support.  Patient was started on lithium and Zyprexa despite having not been on psychiatric medicines in the past although he currently seems to be tolerating them okay.  For now I will continue the lithium and Zyprexa.  We will check a lithium level tomorrow morning.  Lipid panel and EKG also will be checked.  Patient will be engaged in individual and group therapy and assessment.  He will benefit from outpatient mental health follow-up after discharge.  Observation Level/Precautions:  15  minute checks  Laboratory:  Chemistry Profile  Psychotherapy:    Medications:    Consultations:    Discharge Concerns:    Estimated LOS:  Other:     Physician Treatment Plan for Primary Diagnosis: Bipolar affective disorder, depressed, severe, with psychotic behavior ([place]) Long Term Goal(s): Improvement in symptoms so as ready for discharge  Short Term Goals: Ability to verbalize feelings will improve, Ability to disclose and discuss suicidal ideas and Ability to demonstrate self-control will improve  Physician Treatment Plan for Secondary Diagnosis: Principal Problem:   Bipolar affective disorder, depressed, severe, with psychotic behavior ([place]) Active Problems:   Chronic back pain   Somatic symptom disorder   H/O hernia repair   Cocaine abuse ([place])  Long Term Goal(s): Improvement in symptoms so as ready for discharge  Short Term Goals: Ability to maintain clinical measurements within normal limits will improve and Compliance with prescribed medications will improve  I certify that inpatient services furnished can reasonably be expected to improve the patient's condition.    Alethia Berthold, MD 5/7/202112:33 PM

## 2020-04-30 NOTE — [facility] Suicide Risk Assessment (Signed)
[place] INPATIENT:  Family/Significant Other Suicide Prevention Education  Suicide Prevention Education:  Patient Refusal for Family/Significant Other Suicide Prevention Education: The patient Johnny [name]. has refused to provide written consent for family/significant other to be provided Family/Significant Other Suicide Prevention Education during admission and/or prior to discharge.  Physician notified.  SPE completed with pt, as pt refused to consent to family contact. SPI pamphlet provided to pt and pt was encouraged to share information with support network, ask questions, and talk about any concerns relating to SPE. Pt denies access to guns/firearms and verbalized understanding of information provided. Mobile Crisis information also provided to pt.   Rozann Lesches 04/30/2020, 12:08 PM

## 2020-04-30 NOTE — [facility] Suicide Risk Assessment (Signed)
[facility] Admission Suicide Risk Assessment   Nursing information obtained from:  Patient Demographic factors:  Male, Caucasian, Low socioeconomic status, Living alone, Unemployed Current Mental Status:  Suicide plan, Belief that plan would result in death Loss Factors:  Decline in physical health, Financial problems / change in socioeconomic status Historical Factors:  Impulsivity Risk Reduction Factors:  NA  Total Time spent with patient: 1 hour Principal Problem: Bipolar affective disorder, depressed, severe, with psychotic behavior ([place]) Diagnosis:  Principal Problem:   Bipolar affective disorder, depressed, severe, with psychotic behavior ([place]) Active Problems:   Chronic back pain   Somatic symptom disorder   H/O hernia repair  Subjective Data: Seen and chart reviewed.  49 year old man transferred from the medical service where he was being treated for a wound site infection.  Concerns about mood symptoms and complaints of possibly psychotic symptoms as well.  Patient complains of "paranoia".  Vague feelings that someone is out to get him.  Also says his mood has been both depressed and angry recently.  Had not been sleeping well.  Prior to this hospitalization he admits that he was using crack cocaine fairly often.  Patient said he had been sleeping very poorly before coming to the hospital although his appetite was adequate.  He presented to the emergency room on 30 April with complaints related to an umbilical hernia although if I understand it looks like the very following day he had treatment for a inguinal hernia.  Prior to coming to the hospital for this today he says that he had been on lithium and olanzapine which were started at [facility].  He had had a hospitalization there at the beginning of April which by his account was his first psychiatric treatment.  Patient says he continues to "see things and hear things".  Visual hallucinations worse than auditory.  Does not see  people but instead says he will see objects out of the corner of his eye.  Continues to describe himself as depressed but has no acute intent to harm himself here in the hospital.  In interview he comes across is lucid and clear and not responding to internal stimuli or driven by any delusions.  Continued Clinical Symptoms:  Alcohol Use Disorder Identification Test Final Score (AUDIT): 4 The "Alcohol Use Disorders Identification Test", Guidelines for Use in Primary Care, Second Edition.  World Pharmacologist [facility]). Score between 0-7:  no or low risk or alcohol related problems. Score between 8-15:  moderate risk of alcohol related problems. Score between 16-19:  high risk of alcohol related problems. Score 20 or above:  warrants further diagnostic evaluation for alcohol dependence and treatment.   CLINICAL FACTORS:   Bipolar Disorder:   Mixed State Alcohol/Substance Abuse/Dependencies   Musculoskeletal: Strength & Muscle Tone: within normal limits Gait & Station: normal Patient leans: N/A  Psychiatric Specialty Exam: Physical Exam  Nursing note and vitals reviewed. Constitutional: He appears well-developed and well-nourished.  HENT:  Head: Normocephalic and atraumatic.  Eyes: Pupils are equal, round, and reactive to light. Conjunctivae are normal.  Cardiovascular: Regular rhythm and normal heart sounds.  Respiratory: Effort normal.  GI: Soft.  Musculoskeletal:        General: Normal range of motion.     Cervical back: Normal range of motion.  Neurological: He is alert.  Skin: Skin is warm and dry.  Psychiatric: His speech is normal and behavior is normal. Judgment normal. Thought content is paranoid. Cognition and memory are normal. He exhibits a depressed  mood. He expresses no homicidal and no suicidal ideation.    Review of Systems  Constitutional: Negative.   HENT: Negative.   Eyes: Negative.   Respiratory: Negative.   Cardiovascular: Negative.   Gastrointestinal:  Negative.   Musculoskeletal: Negative.   Skin: Negative.   Neurological: Negative.   Psychiatric/Behavioral: Positive for dysphoric mood and hallucinations. Negative for suicidal ideas.    Blood pressure 139/89, pulse 89, temperature 98 F (36.7 C), temperature source Oral, resp. rate 18, height 6' (1.829 m), weight 90 kg, SpO2 98 %.Body mass index is 26.91 kg/m.  General Appearance: Casual  Eye Contact:  Good  Speech:  Clear and Coherent  Volume:  Normal  Mood:  Depressed  Affect:  Congruent  Thought Process:  Coherent  Orientation:  Full (Time, Place, and Person)  Thought Content:  Logical  Suicidal Thoughts:  No  Homicidal Thoughts:  No  Memory:  Immediate;   Fair Recent;   Fair Remote;   Fair  Judgement:  Fair  Insight:  Shallow  Psychomotor Activity:  Decreased  Concentration:  Concentration: Fair  Recall:  [employer] of Knowledge:  Fair  Language:  Fair  Akathisia:  No  Handed:  Right  AIMS (if indicated):     Assets:  Desire for Improvement  ADL's:  Impaired  Cognition:  WNL  Sleep:  Number of Hours: 7.5      COGNITIVE FEATURES THAT CONTRIBUTE TO RISK:  None    SUICIDE RISK:   Minimal: No identifiable suicidal ideation.  Patients presenting with no risk factors but with morbid ruminations; may be classified as minimal risk based on the severity of the depressive symptoms  PLAN OF CARE: Continue 15-minute checks.  For now continue medications as started at previous hospital.  Still not entirely clear to me what the correct diagnosis or correct treatment plan is for this gentleman.  We will try to keep the pain medication at a lower level despite his attempts to increase the amount of opiate that he is getting.  Include in individual and group therapy and work on appropriate discharge planning  I certify that inpatient services furnished can reasonably be expected to improve the patient's condition.   Alethia Berthold, MD 04/30/2020, 12:26 PM
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
## 2020-05-01 LAB — LITHIUM LEVEL: Lithium Lvl: 0.58 mmol/L — ABNORMAL LOW (ref 0.60–1.20)

## 2020-05-01 MED ORDER — OLANZAPINE 10 MG PO TABS
10.0000 mg | ORAL_TABLET | Freq: Every day | ORAL | Status: DC
  Administered 2020-05-01 – 2020-05-03 (×3): 10 mg via ORAL
  Filled 2020-05-01 (×3): qty 1

## 2020-05-01 MED ORDER — OLANZAPINE 5 MG PO TABS
5.0000 mg | ORAL_TABLET | Freq: Every morning | ORAL | Status: DC
  Administered 2020-05-02 – 2020-05-04 (×3): 5 mg via ORAL
  Filled 2020-05-01 (×3): qty 1

## 2020-05-01 MED ORDER — HYDROXYZINE HCL 25 MG PO TABS
25.0000 mg | ORAL_TABLET | Freq: Three times a day (TID) | ORAL | Status: DC | PRN
  Administered 2020-05-01 – 2020-05-03 (×3): 25 mg via ORAL
  Filled 2020-05-01 (×3): qty 1

## 2020-05-01 NOTE — Progress Notes (Signed)
Patient was in the day room upon arrival to the unit. Pt pleasant during assessment denying SI/HI/AVH. Patient endorses pain stating it was 10/10, see MAR. Patient given education. Patient given support and encouragement to be active in his treatment plan. Patient observed by this Probation officer interacting appropriately with staff and peers on the unit. Patient being monitored Q 15 minutes for safety per unit protocol. Patient remains safe on the unit.

## 2020-05-01 NOTE — Progress Notes (Signed)
Patient pleasant and cooperative. Denies SI, HI, AVH. Medication compliant. Appropriate with staff and peers. Continues to complain of stomach and groin pain and anxiety. Medications given with partial relief. Encouragement and support provided. Safety checks maintained. Pt receptive and remains safe on unit with q 15 min checks.

## 2020-05-01 NOTE — [facility] Group Notes (Addendum)
LCSW Group Therapy 05/01/2020 1:00pm  Type of Therapy and Topic:  Group Therapy:  Setting Goals  Participation Level:  Active  Description of Group: In this process group, patients discussed using strengths to work toward goals and address challenges.  Patients identified two positive things about themselves and one goal they were working on.  Patients were given the opportunity to share openly and support each other's plan for self-empowerment.  The group discussed the value of gratitude and were encouraged to have a daily reflection of positive characteristics or circumstances.  Patients were encouraged to identify a plan to utilize their strengths to work on current challenges and goals.  Therapeutic Goals 1. Patient will verbalize personal strengths/positive qualities and relate how these can assist with achieving desired personal goals 2. Patients will verbalize affirmation of peers plans for personal change and goal setting 3. Patients will explore the value of gratitude and positive focus as related to successful achievement of goals 4. Patients will verbalize a plan for regular reinforcement of personal positive qualities and circumstances.  Summary of Patient Progress: Patient identified the definition of goals.Patients was given the opportunity to share openly and support other group members' plan for self-empowerment. Patient verbalized personal strength and how they relate to achieving the desired goal. Patient was able to identify positive goals to work towards when he returns home. Patient participated in group and defined a goal as "something you need to work towards." He participated in group discussion of the importance of setting goals. Group discussed SMART goals. Each group member identified a SMART goal they would like to achieve and discuss the steps they would like to take towards achievement. He discussed that he wants to get clean and to control his anger.     Therapeutic  Modalities Cognitive Behavioral Therapy Motivational Interviewing    Netta Neat, [name], LCSW Clinical Social Work

## 2020-05-01 NOTE — Progress Notes (Signed)
[facility] MD Progress Note  05/01/2020 9:22 AM [place].  MRN:  [id number]   Johnny [name] is a 49yo M with psych h/o bipolar disorder, substance use disorder (opioids, cocaine, alcohol), who was admitted to the inpatient [place] unit yesterday from medical service due to exacerbation of bipolar disorder with cutrrent depression and psychotic symptoms.   Patient seen.  Chart reviewed. Patient discussed with nursing; no overnight events reported.  Subjective: Patient reports "I feel no different". Reports still having visual hallucinations, feeling paranoid and depressed. Reports feeling safe here in the unit.  Denies feeling suicidal or homicidal. He reports "I did not sleep last night" and requests to add a medication for sleep. He does not want Trazodone or Melatonin "they don`t work for me". He agreed to go up on the evening dose of current antipsychotic. He was informed that the order for PRN Hydroxyzine will be placed "as needed" for anxiety and sleep. Denies side effects from current medications.  Labwork update: Lithium level was 0.58. Lipid panel - mostly WNL with decreased HDL Chol. ECG - NSR, QTc 437m.   Principal Problem: Bipolar affective disorder, depressed, severe, with psychotic behavior ([place] Diagnosis: Principal Problem:   Bipolar affective disorder, depressed, severe, with psychotic behavior ([place] Active Problems:   Chronic back pain   Somatic symptom disorder   H/O hernia repair   Cocaine abuse ([place]  Total Time spent with patient: 20 minutes  Past Psychiatric History: see H&P  Past Medical History:  Past Medical History:  Diagnosis Date  . Arthritis    hands, knees, lower back  . Bilateral sciatica   . Carpal tunnel syndrome   . Chronic back pain   . Chronic neck pain   . Chronic pain 03/12/2020   has been connected to pain management clinics. currently waiting to see psychiatrist before he can join [facility]  . Chronic pain syndrome   . GERD  (gastroesophageal reflux disease)   . Hand joint pain 07/02/2013  . Herniated disc   . Lives in homeless shelter 02/2020   housing ends as of 03/25/20. going to hotel after surgery  . Traumatic amputation of left index finger 2013  . Wears dentures    full upper and lower    Past Surgical History:  Procedure Laterality Date  . AMPUTATION FINGER / THUMB Left 2013  . COLONOSCOPY WITH PROPOFOL N/A 01/03/2018   Procedure: COLONOSCOPY WITH PROPOFOL;  Surgeon: WLucilla Lame MD;  [place]  Service: Endoscopy;  Laterality: N/A;  . ESOPHAGOGASTRODUODENOSCOPY (EGD) WITH PROPOFOL N/A 01/03/2018   Procedure: ESOPHAGOGASTRODUODENOSCOPY (EGD) WITH PROPOFOL;  Surgeon: WLucilla Lame MD;  [place]  Service: Endoscopy;  Laterality: N/A;  . HERNIA REPAIR Right    right inguinal hernia repaired twice  . HIP SURGERY  2006   took bone out of hip to put into neck  . INGUINAL HERNIA REPAIR Left 04/23/2020   Procedure: HERNIA REPAIR INGUINAL ADULT;  Surgeon: CFredirick Maudlin MD;  Location: [facility];  Service: General;  Laterality: Left;  . JOINT REPLACEMENT Right    knee  . KNEE SURGERY Right   . LUMBAR LAMINECTOMY/DECOMPRESSION MICRODISCECTOMY N/A 11/18/2019   Procedure: LUMBAR FOUR-FIVE LUMBAR LAMINECTOMY/DECOMPRESSION MICRODISCECTOMY;  Surgeon: CAshok Pall MD;  [place] [facility] [facility]  Service: Neurosurgery;  Laterality: N/A;  . LUMBAR WOUND DEBRIDEMENT N/A 12/06/2019   Procedure: LUMBAR WOUND REVISION;  Surgeon: OJudith Part MD;  [place] [facility]  Service: Neurosurgery;  Laterality: N/A;  . neck  fusion    . POLYPECTOMY N/A 01/03/2018   Procedure: POLYPECTOMY;  Surgeon: [name], MD;  Location: [place];  Service: Endoscopy;  Laterality: N/A;  . REPAIR OF CEREBROSPINAL FLUID LEAK N/A 11/28/2019   Procedure: REPAIR OF CEREBROSPINAL FLUID LEAK/LUMBAR;  Surgeon: [name], MD;  Location: [place];  Service: Neurosurgery;  Laterality: N/A;  REPAIR OF  CEREBROSPINAL FLUID LEAK/LUMBAR  . REPLACEMENT TOTAL KNEE Right   . SHOULDER SURGERY Right 2016 X 2  . SPINAL FUSION    . TONSILLECTOMY    . UMBILICAL HERNIA REPAIR N/A 11/07/2019   Procedure: HERNIA REPAIR UMBILICAL ADULT;  Surgeon: [name], MD;  Location: [facility];  Service: General;  Laterality: N/A;  . UMBILICAL HERNIA REPAIR N/A 04/23/2020   Procedure: HERNIA REPAIR UMBILICAL ADULT;  Surgeon: [name], MD;  Location: [facility];  Service: General;  Laterality: N/A;  . WOUND EXPLORATION N/A 12/15/2019   Procedure: LUMBAR WOUND EXPLORATION;  Surgeon: [name], MD;  Location: [place];  Service: Neurosurgery;  Laterality: N/A;  LUMBAR WOUND EXPLORATION   Family History:  Family History  Problem Relation Age of Onset  . Cancer Father        sarcoma  . Cirrhosis Paternal Grandmother   . Cancer Paternal Grandfather        Pancreatic  . Fibromyalgia Sister   . Deafness Sister    Family Psychiatric  History: see H&P Social History:  Social History   Substance and Sexual Activity  Alcohol Use No     Social History   Substance and Sexual Activity  Drug Use No    Social History   Socioeconomic History  . Marital status: Divorced    Spouse name: Not on file  . Number of children: Not on file  . Years of education: Not on file  . Highest education level: Not on file  Occupational History  . Occupation: delivery driver  Tobacco Use  . Smoking status: Current Every Day Smoker    Packs/day: 0.50    Years: 25.00    Pack years: 12.50    Types: Cigarettes  . Smokeless tobacco: Never Used  . Tobacco comment: will start Chantix soon.  Substance and Sexual Activity  . Alcohol use: No  . Drug use: No  . Sexual activity: Yes  Other Topics Concern  . Not on file  Social History Narrative   Patient currently lives in homeless shelter with 6 other men. This arrangement ends as of 03/25/2020.  Social Work has been aggressively working with patient regarding housing  and financial issues. He will go to a hotel after surgery and has a friend named [name] that will stay with him. Unsure of where he will go after that day.   He has met with MD at [facility] pain clinic but will not receive any pain medicine from them until he sees a psychiatrist. This is scheduled for May.  He is currently out of pain medicine and has frequented the ER for help.   Dr. [name] will be able to order post op medicine since he has not yet signed a contract.   Social Determinants of Health   Financial Resource Strain:   . Difficulty of Paying Living Expenses:   Food Insecurity:   . Worried About Charity fundraiser in the Last Year:   . Arboriculturist in the Last Year:   Transportation Needs:   . Film/video editor (Medical):   Marland Kitchen Lack of Transportation (Non-Medical):  Physical Activity:   . Days of Exercise per Week:   . Minutes of Exercise per Session:   Stress:   . Feeling of Stress :   Social Connections:   . Frequency of Communication with Friends and Family:   . Frequency of Social Gatherings with Friends and Family:   . Attends Religious Services:   . Active Member of Clubs or Organizations:   . Attends Archivist Meetings:   Marland Kitchen Marital Status:    Additional Social History:                         Sleep: Fair - objectively. "Poor" - per patient.  Appetite:  Fair  Current Medications: Current Facility-Administered Medications  Medication Dose Route Frequency Provider Last Rate Last Admin  . acetaminophen (TYLENOL) tablet 650 mg  650 mg Oral Q6H PRN Alesia Morin, MD   650 mg at 04/30/20 2116  . alum & mag hydroxide-simeth (MAALOX/MYLANTA) 200-200-20 MG/5ML suspension 30 mL  30 mL Oral Q4H PRN Alesia Morin, MD      . ciprofloxacin (CIPRO) tablet 500 mg  500 mg Oral BID Clapacs, Madie Reno, MD   500 mg at 05/01/20 0753  . doxycycline (VIBRA-TABS) tablet 100 mg  100 mg Oral Q12H Clapacs, Madie Reno, MD   100 mg at 05/01/20 0753  . gabapentin  (NEURONTIN) capsule 1,200 mg  1,200 mg Oral TID Clapacs, Madie Reno, MD   1,200 mg at 05/01/20 0753  . hydrOXYzine (ATARAX/VISTARIL) tablet 50 mg  50 mg Oral Q6H PRN Clapacs, Madie Reno, MD   50 mg at 05/01/20 0753  . lithium carbonate (ESKALITH) CR tablet 450 mg  450 mg Oral Q12H Clapacs, Madie Reno, MD   450 mg at 05/01/20 0753  . magnesium hydroxide (MILK OF MAGNESIA) suspension 30 mL  30 mL Oral Daily PRN Alesia Morin, MD   30 mL at 04/30/20 1230  . nicotine (NICODERM CQ - dosed in mg/24 hours) patch 21 mg  21 mg Transdermal Q0600 Clapacs, John T, MD   21 mg at 05/01/20 0630  . OLANZapine (ZYPREXA) tablet 5 mg  5 mg Oral BID Clapacs, Madie Reno, MD   5 mg at 05/01/20 0753  . oxyCODONE (Oxy IR/ROXICODONE) immediate release tablet 5 mg  5 mg Oral Q6H PRN Clapacs, Madie Reno, MD   5 mg at 05/01/20 0631  . tiZANidine (ZANAFLEX) tablet 4 mg  4 mg Oral Q6H PRN Clapacs, Madie Reno, MD   4 mg at 04/30/20 1655    Lab Results:  Results for orders placed or performed during the hospital encounter of 04/29/20 (from the past 48 hour(s))  Lithium level     Status: Abnormal   Collection Time: 05/01/20  6:54 AM  Result Value Ref Range   Lithium Lvl 0.58 (L) 0.60 - 1.20 mmol/L    Comment: Performed at [facility], [address] [place], [place] [address]    Blood Alcohol level:  Lab Results  Component Value Date   [facility] <10 04/24/2020   ETH <10 46/56/8127    Metabolic Disorder Labs: No results found for: HGBA1C, MPG No results found for: PROLACTIN Lab Results  Component Value Date   CHOL 137 04/29/2020   TRIG 104 04/29/2020   HDL 25 (L) 04/29/2020   CHOLHDL 5.5 04/29/2020   VLDL 21 04/29/2020   LDLCALC 91 04/29/2020   LDLCALC 67 06/19/2017    Physical Findings: AIMS:  , ,  ,  ,  CIWA:    COWS:     Musculoskeletal: Strength & Muscle Tone: within normal limits Gait & Station: normal Patient leans: N/A  Psychiatric Specialty Exam: Physical Exam  Review of Systems  Blood pressure (!)  136/96, pulse 76, temperature 98 F (36.7 C), temperature source Oral, resp. rate 18, height 6' (1.829 m), weight 90 kg, SpO2 100 %.Body mass index is 26.91 kg/m.  General Appearance: Casual  Eye Contact:  Fair  Speech:  Normal Rate  Volume:  Normal  Mood:  Irritable  Affect:  Constricted  Thought Process:  Coherent and Goal Directed  Orientation:  Full (Time, Place, and Person)  Thought Content:  Hallucinations: Visual and Paranoid Ideation  Suicidal Thoughts:  No  Homicidal Thoughts:  No  Memory:  Immediate;   Fair Recent;   Fair Remote;   Fair  Judgement:  Fair  Insight:  Fair  Psychomotor Activity:  Normal  Concentration:  Concentration: Fair and Attention Span: Fair  Recall:  [employer] of Knowledge:  Fair  Language:  Fair  Akathisia:  No  Handed:  Right  AIMS (if indicated):     Assets:  Communication Skills Desire for Improvement  ADL's:  Intact  Cognition:  WNL  Sleep:  Number of Hours: 7.25    49yo M with psych h/o bipolar disorder, substance use disorder (opioids, cocaine, alcohol), who was admitted to the inpatient [place] unit yesterday from medical service due to exacerbation of bipolar disorder with cutrrent depression and psychotic symptoms.  Patient reports no improvement in his mood, sleep or psychotic symptoms since admission. Labwork reviewed, no significant abnormalities revealed. Li level is close to therapeutic range. Today I will add low-dose of Doxepin PRN for sleep and increase the dose of antipsychotic (Zyprexa) to target hallucinations and reported paranoia.  Treatment Plan Summary: Daily contact with patient to assess and evaluate symptoms and progress in treatment and Medication management  Continue inpatient psych admission; 15-minute checks; daily contact with patient to assess and evaluate symptoms and progress in treatment; psychoeducation. Continue Lithium '450mg'$  PO BID for mood stabilization. Continue Zyprexa '5mg'$  PO QAM and increase Zyprexa to '10mg'$   PO QHS for psychotic symptoms. Start Hydroxyzine '25mg'$  PO TID PRN anxiety, sleep. Disposition: to be determined.  Larita Fife, MD 05/01/2020, 9:22 AM

## 2020-05-01 NOTE — Plan of Care (Signed)
Patient presents with a flat affect  Problem: Education: Goal: Emotional status will improve Outcome: Not Progressing Goal: Mental status will improve Outcome: Not Progressing

## 2020-05-01 NOTE — Plan of Care (Signed)
  Problem: Education: Goal: Knowledge of [place] [facility] Education information/materials will improve Outcome: Progressing Goal: Emotional status will improve Outcome: Progressing Goal: Mental status will improve Outcome: Progressing Goal: Verbalization of understanding the information provided will improve Outcome: Progressing   Problem: Activity: Goal: Interest or engagement in activities will improve Outcome: Progressing Goal: Sleeping patterns will improve Outcome: Progressing   Problem: Coping: Goal: Ability to verbalize frustrations and anger appropriately will improve Outcome: Progressing Goal: Ability to demonstrate self-control will improve Outcome: Progressing   

## 2020-05-02 ENCOUNTER — Other Ambulatory Visit: Payer: Self-pay | Admitting: Family Medicine

## 2020-05-02 MED ORDER — HYDROXYZINE HCL 50 MG PO TABS
50.0000 mg | ORAL_TABLET | Freq: Every evening | ORAL | Status: DC | PRN
  Administered 2020-05-03: 50 mg via ORAL
  Filled 2020-05-02 (×2): qty 1

## 2020-05-02 NOTE — Progress Notes (Signed)
Patient has been pleasant and cooperative. Complaining of tightness and pain in abdomen. Has been medicated regularly with prn pain medication. Denies SI, HI and AVH. Incision in abdomen is intact without any redness or drainage.

## 2020-05-02 NOTE — B[facility] Group Notes (Signed)
[place] LCSW Group Therapy Note  05/02/2020    Type of Therapy and Topic:  Group Therapy:  A Hero Worthy of Support  Participation Level:  Active   Description of Group:  Patients in this group were introduced to the concept that additional supports including self-support are an essential part of recovery.  Matching needs with supports to help fulfill those needs was explained.  Establishing boundaries that can gradually be increased or decreased was described, with patients giving their own examples of establishing appropriate boundaries in their lives.  A song entitled "My Own Hero" was played and a group discussion ensued in which patients stated it inspired them to help themselves in order to succeed, because other people cannot achieve their goals such as sobriety or stability for them.  A song was played called "I Am Enough" which led to a discussion about being willing to believe we are worth the effort of being a self-support.   Therapeutic Goals: 1)  demonstrate the importance of being a key part of one's own support system 2)  discuss various available supports 3)  encourage patient to use music as part of their self-support and focus on goals 4)  elicit ideas from patients about supports that need to be added   Summary of Patient Progress:  The patient expressed that he is hoping to go from this hospital to a 60-month treatment program where he can continue to work on his sobriety, as well as independence skills.  He said that this hospital stay is beneficial because it has helped him to become sober then focus on figuring out what medicines work for him so that he will no longer have to self-medicate.  There was an incident with another patient who was clearly psychotic and disruptive to the group.  This patient expressed that he was becoming quite anxious and was actually edging toward being angryas a result.  Several calming exercises were used, including hand breathing and a 5-4-3-2-1  grounding exercise.  The patient was completely resistant to the first one and would not even try it.  However, he was drawn into active participation in the 5 senses exercise and said at its conclusion that he felt calm.  Therapeutic Modalities:   Motivational Interviewing Activity  Maretta Los

## 2020-05-02 NOTE — Progress Notes (Signed)
[facility] MD Progress Note  05/02/2020 9:02 AM Johnny [name].  MRN:  [id number]   Mr. Johnny [name] is a 49yo M with psych h/o bipolar disorder, substance use disorder (opioids, cocaine, alcohol), who was admitted to the inpatient [place] unit from medical service due to exacerbation of bipolar disorder with cutrrent depression and psychotic symptoms.   Patient seen.  Chart reviewed. Patient discussed with nursing; no overnight events reported.  Subjective: Patient reports he slept "a little better" and less paranoid today after the HS dose of Zyprexa was increased. Reports still having visual hallucinations, feeling paranoid and depressed, but states that all the above symptoms are "slightly better". Denies feeling suicidal or homicidal. Denies side effects from current medications.  Principal Problem: Bipolar affective disorder, depressed, severe, with psychotic behavior ([name]) Diagnosis: Principal Problem:   Bipolar affective disorder, depressed, severe, with psychotic behavior ([name]) Active Problems:   Chronic back pain   Somatic symptom disorder   H/O hernia repair   Cocaine abuse ([name])  Total Time spent with patient: 20 minutes  Past Psychiatric History: see H&P  Past Medical History:  Past Medical History:  Diagnosis Date  . Arthritis    hands, knees, lower back  . Bilateral sciatica   . Carpal tunnel syndrome   . Chronic back pain   . Chronic neck pain   . Chronic pain 03/12/2020   has been connected to pain management clinics. currently waiting to see psychiatrist before he can join [facility]  . Chronic pain syndrome   . GERD (gastroesophageal reflux disease)   . Hand joint pain 07/02/2013  . Herniated disc   . Lives in homeless shelter 02/2020   housing ends as of 03/25/20. going to hotel after surgery  . Traumatic amputation of left index finger 2013  . Wears dentures    full upper and lower    Past Surgical History:  Procedure Laterality Date  . AMPUTATION FINGER / THUMB  Left 2013  . COLONOSCOPY WITH PROPOFOL N/A 01/03/2018   Procedure: COLONOSCOPY WITH PROPOFOL;  Surgeon: [name], MD;  Location: [place];  Service: Endoscopy;  Laterality: N/A;  . ESOPHAGOGASTRODUODENOSCOPY (EGD) WITH PROPOFOL N/A 01/03/2018   Procedure: ESOPHAGOGASTRODUODENOSCOPY (EGD) WITH PROPOFOL;  Surgeon: [name], MD;  Location: [place];  Service: Endoscopy;  Laterality: N/A;  . HERNIA REPAIR Right    right inguinal hernia repaired twice  . HIP SURGERY  2006   took bone out of hip to put into neck  . INGUINAL HERNIA REPAIR Left 04/23/2020   Procedure: HERNIA REPAIR INGUINAL ADULT;  Surgeon: [name], MD;  Location: [facility];  Service: General;  Laterality: Left;  . JOINT REPLACEMENT Right    knee  . KNEE SURGERY Right   . LUMBAR LAMINECTOMY/DECOMPRESSION MICRODISCECTOMY N/A 11/18/2019   Procedure: LUMBAR FOUR-FIVE LUMBAR LAMINECTOMY/DECOMPRESSION MICRODISCECTOMY;  Surgeon: [name], MD;  Location: [place];  Service: Neurosurgery;  Laterality: N/A;  . LUMBAR WOUND DEBRIDEMENT N/A 12/06/2019   Procedure: LUMBAR WOUND REVISION;  Surgeon: [name], MD;  Location: [name];  Service: Neurosurgery;  Laterality: N/A;  . neck fusion    . POLYPECTOMY N/A 01/03/2018   Procedure: POLYPECTOMY;  Surgeon: [name], MD;  Location: [name];  Service: Endoscopy;  Laterality: N/A;  . REPAIR OF CEREBROSPINAL FLUID LEAK N/A 11/28/2019   Procedure: REPAIR OF CEREBROSPINAL FLUID LEAK/LUMBAR;  Surgeon: [name], MD;  Location: [name];  Service: Neurosurgery;  Laterality: N/A;  REPAIR OF CEREBROSPINAL FLUID LEAK/LUMBAR  .  REPLACEMENT TOTAL KNEE Right   . SHOULDER SURGERY Right 2016 X 2  . SPINAL FUSION    . TONSILLECTOMY    . UMBILICAL HERNIA REPAIR N/A 11/07/2019   Procedure: HERNIA REPAIR UMBILICAL ADULT;  Surgeon: [name], MD;  Location: [facility];  Service: General;  Laterality: N/A;  . UMBILICAL HERNIA REPAIR N/A 04/23/2020    Procedure: HERNIA REPAIR UMBILICAL ADULT;  Surgeon: [name], MD;  Location: [facility];  Service: General;  Laterality: N/A;  . WOUND EXPLORATION N/A 12/15/2019   Procedure: LUMBAR WOUND EXPLORATION;  Surgeon: [name], MD;  Location: [name];  Service: Neurosurgery;  Laterality: N/A;  LUMBAR WOUND EXPLORATION   Family History:  Family History  Problem Relation Age of Onset  . Cancer Father        sarcoma  . Cirrhosis Paternal Grandmother   . Cancer Paternal Grandfather        Pancreatic  . Fibromyalgia Sister   . Deafness Sister    Family Psychiatric  History: see H&P Social History:  Social History   Substance and Sexual Activity  Alcohol Use No     Social History   Substance and Sexual Activity  Drug Use No    Social History   Socioeconomic History  . Marital status: Divorced    Spouse name: Not on file  . Number of children: Not on file  . Years of education: Not on file  . Highest education level: Not on file  Occupational History  . Occupation: delivery driver  Tobacco Use  . Smoking status: Current Every Day Smoker    Packs/day: 0.50    Years: 25.00    Pack years: 12.50    Types: Cigarettes  . Smokeless tobacco: Never Used  . Tobacco comment: will start Chantix soon.  Substance and Sexual Activity  . Alcohol use: No  . Drug use: No  . Sexual activity: Yes  Other Topics Concern  . Not on file  Social History Narrative   Patient currently lives in homeless shelter with 6 other men. This arrangement ends as of 03/25/2020.  Social Work has been aggressively working with patient regarding housing and financial issues. He will go to a hotel after surgery and has a friend named Johnny [name] that will stay with him. Unsure of where he will go after that day.   He has met with MD at [facility] pain clinic but will not receive any pain medicine from them until he sees a psychiatrist. This is scheduled for May.  He is currently out of pain medicine and has frequented  the ER for help.   Dr. [name] will be able to order post op medicine since he has not yet signed a contract.   Social Determinants of Health   Financial Resource Strain:   . Difficulty of Paying Living Expenses:   Food Insecurity:   . Worried About Charity fundraiser in the Last Year:   . Arboriculturist in the Last Year:   Transportation Needs:   . Film/video editor (Medical):   Marland Kitchen Lack of Transportation (Non-Medical):   Physical Activity:   . Days of Exercise per Week:   . Minutes of Exercise per Session:   Stress:   . Feeling of Stress :   Social Connections:   . Frequency of Communication with Friends and Family:   . Frequency of Social Gatherings with Friends and Family:   . Attends Religious Services:   . Active Member of Clubs or  Organizations:   . Attends Archivist Meetings:   Marland Kitchen Marital Status:    Additional Social History:                         Sleep: Fair - objectively. "Poor" - per patient.  Appetite:  Fair  Current Medications: Current Facility-Administered Medications  Medication Dose Route Frequency Provider Last Rate Last Admin  . acetaminophen (TYLENOL) tablet 650 mg  650 mg Oral Q6H PRN Alesia Morin, MD   650 mg at 05/01/20 2107  . alum & mag hydroxide-simeth (MAALOX/MYLANTA) 200-200-20 MG/5ML suspension 30 mL  30 mL Oral Q4H PRN Alesia Morin, MD      . ciprofloxacin (CIPRO) tablet 500 mg  500 mg Oral BID Clapacs, Madie Reno, MD   500 mg at 05/02/20 0830  . doxycycline (VIBRA-TABS) tablet 100 mg  100 mg Oral Q12H Clapacs, John T, MD   100 mg at 05/02/20 0830  . gabapentin (NEURONTIN) capsule 1,200 mg  1,200 mg Oral TID Clapacs, John T, MD   1,200 mg at 05/02/20 0830  . hydrOXYzine (ATARAX/VISTARIL) tablet 25 mg  25 mg Oral TID PRN Larita Fife, MD   25 mg at 05/01/20 1221  . hydrOXYzine (ATARAX/VISTARIL) tablet 50 mg  50 mg Oral Q6H PRN Clapacs, Madie Reno, MD   50 mg at 05/02/20 0830  . lithium carbonate (ESKALITH) CR tablet 450  mg  450 mg Oral Q12H Clapacs, John T, MD   450 mg at 05/02/20 0830  . magnesium hydroxide (MILK OF MAGNESIA) suspension 30 mL  30 mL Oral Daily PRN Alesia Morin, MD   30 mL at 04/30/20 1230  . nicotine (NICODERM CQ - dosed in mg/24 hours) patch 21 mg  21 mg Transdermal Q0600 Clapacs, Madie Reno, MD   21 mg at 05/02/20 0618  . OLANZapine (ZYPREXA) tablet 10 mg  10 mg Oral QHS Larita Fife, MD   10 mg at 05/01/20 2106  . OLANZapine (ZYPREXA) tablet 5 mg  5 mg Oral q morning - 10a , , MD      . oxyCODONE (Oxy IR/ROXICODONE) immediate release tablet 5 mg  5 mg Oral Q6H PRN Clapacs, Madie Reno, MD   5 mg at 05/02/20 0617  . tiZANidine (ZANAFLEX) tablet 4 mg  4 mg Oral Q6H PRN Clapacs, Madie Reno, MD   4 mg at 04/30/20 1655    Lab Results:  Results for orders placed or performed during the [name] encounter of 04/29/20 (from the past 48 hour(s))  Lithium level     Status: Abnormal   Collection Time: 05/01/20  6:54 AM  Result Value Ref Range   Lithium Lvl 0.58 (L) 0.60 - 1.20 mmol/L    Comment: Performed at [name], [address] [place], [place] [address]    Blood Alcohol level:  Lab Results  Component Value Date   [facility] <10 04/24/2020   ETH <10 00/17/4944    Metabolic Disorder Labs: No results found for: HGBA1C, MPG No results found for: PROLACTIN Lab Results  Component Value Date   CHOL 137 04/29/2020   TRIG 104 04/29/2020   HDL 25 (L) 04/29/2020   CHOLHDL 5.5 04/29/2020   VLDL 21 04/29/2020   LDLCALC 91 04/29/2020   LDLCALC 67 06/19/2017    Physical Findings: AIMS:  , ,  ,  ,    CIWA:    COWS:     Musculoskeletal: Strength & Muscle Tone: within normal limits Gait &  Station: normal Patient leans: N/A  Psychiatric Specialty Exam: Physical Exam   Review of Systems   Blood pressure (!) 132/95, pulse 79, temperature (!) 97.5 F (36.4 C), temperature source Oral, resp. rate 18, height 6' (1.829 m), weight 90 kg, SpO2 99 %.Body mass index is 26.91 kg/m.   General Appearance: Casual  Eye Contact:  Fair  Speech:  Normal Rate  Volume:  Normal  Mood:  Irritable  Affect:  Constricted  Thought Process:  Coherent and Goal Directed  Orientation:  Full (Time, Place, and Person)  Thought Content:  Hallucinations: Visual and Paranoid Ideation  Suicidal Thoughts:  No  Homicidal Thoughts:  No  Memory:  Immediate;   Fair Recent;   Fair Remote;   Fair  Judgement:  Fair  Insight:  Fair  Psychomotor Activity:  Normal  Concentration:  Concentration: Fair and Attention Span: Fair  Recall:  [employer] of Knowledge:  Fair  Language:  Fair  Akathisia:  No  Handed:  Right  AIMS (if indicated):     Assets:  Communication Skills Desire for Improvement  ADL's:  Intact  Cognition:  WNL  Sleep:  Number of Hours: 6.5    49yo M with psych h/o bipolar disorder, substance use disorder (opioids, cocaine, alcohol), who was admitted to the inpatient [place] unit yesterday from medical service due to exacerbation of bipolar disorder with current depression and psychotic symptoms.  Patient reports some improvement in his mood, sleep and psychotic symptoms after the dose of HS Zyprexa was increased. Labwork reviewed yesterday, no significant abnormalities revealed. Li level is close to therapeutic range. No medication changes will be made today.  Treatment Plan Summary: Daily contact with patient to assess and evaluate symptoms and progress in treatment and Medication management  Continue inpatient psych admission; 15-minute checks; daily contact with patient to assess and evaluate symptoms and progress in treatment; psychoeducation. Continue Lithium 452m PO BID for mood stabilization. Continue Zyprexa 584mPO QAM and 1024mO QHS for psychotic symptoms. Continue Hydroxyzine 24m81m TID PRN anxiety, and 50mg54mQHS PRN sleep. Disposition: to be determined.  AlisaLarita Fife5/08/2020, 9:02 AM

## 2020-05-02 NOTE — Plan of Care (Signed)
Patient stated he was feeling better today  Problem: Education: Goal: Emotional status will improve Outcome: Progressing Goal: Mental status will improve Outcome: Progressing   

## 2020-05-02 NOTE — Plan of Care (Signed)
  Problem: Education: Goal: Knowledge of [facility] Education information/materials will improve Outcome: Progressing Goal: Emotional status will improve Outcome: Progressing Goal: Mental status will improve Outcome: Progressing Goal: Verbalization of understanding the information provided will improve Outcome: Progressing   

## 2020-05-02 NOTE — Progress Notes (Signed)
Pt is alert and oriented to person, place, time and situation. Pt is calm, cooperative, affect is flat, makes fair eye contact, mood is depressed, c/o anxiety, requests and is given PRN medication for anxiety, (see MAR for those details). Pt is medication complaint, visible in the dayroom, minimally social with select peers. Pt's appetite is good, pt showered. Pt denies any pain, no distress noted, none reported. Will continue to monitor pt per Q15 minute face checks and monitor for safety and progress.

## 2020-05-02 NOTE — Progress Notes (Signed)
Patient was in the day room upon arrival to the unit. Pt pleasant during assessment denying SI/HI/AVH. Patient endorses pain stating it was 8/10, see MAR. Patient given education. Patient given support and encouragement to be active in his treatment plan. Patient observed by this Probation officer interacting appropriately with staff and peers on the unit. Patient being monitored Q 15 minutes for safety per unit protocol. Patient remains safe on the unit.

## 2020-05-03 MED ORDER — LITHIUM CARBONATE ER 450 MG PO TBCR
450.0000 mg | EXTENDED_RELEASE_TABLET | Freq: Every day | ORAL | Status: DC
  Administered 2020-05-04: 450 mg via ORAL
  Filled 2020-05-03: qty 1

## 2020-05-03 MED ORDER — LITHIUM CARBONATE ER 300 MG PO TBCR
600.0000 mg | EXTENDED_RELEASE_TABLET | Freq: Every evening | ORAL | Status: DC
  Administered 2020-05-03: 600 mg via ORAL
  Filled 2020-05-03: qty 2

## 2020-05-03 NOTE — Tx Team (Addendum)
Interdisciplinary Treatment and Diagnostic Plan Update  05/03/2020 Time of Session: 9:00AM [facility] Malka So. MRN: [id number]  Principal Diagnosis: Bipolar affective disorder, depressed, severe, with psychotic behavior ([place])  Secondary Diagnoses: Principal Problem:   Bipolar affective disorder, depressed, severe, with psychotic behavior ([place]) Active Problems:   Chronic back pain   Somatic symptom disorder   H/O hernia repair   Cocaine abuse ([place])   Current Medications:  Current Facility-Administered Medications  Medication Dose Route Frequency Provider Last Rate Last Admin  . acetaminophen (TYLENOL) tablet 650 mg  650 mg Oral Q6H PRN Alesia Morin, MD   650 mg at 05/02/20 1832  . alum & mag hydroxide-simeth (MAALOX/MYLANTA) 200-200-20 MG/5ML suspension 30 mL  30 mL Oral Q4H PRN Alesia Morin, MD      . ciprofloxacin (CIPRO) tablet 500 mg  500 mg Oral BID Clapacs, Madie Reno, MD   500 mg at 05/03/20 0815  . doxycycline (VIBRA-TABS) tablet 100 mg  100 mg Oral Q12H Clapacs, Madie Reno, MD   100 mg at 05/03/20 0815  . gabapentin (NEURONTIN) capsule 1,200 mg  1,200 mg Oral TID Clapacs, John T, MD   1,200 mg at 05/03/20 0815  . hydrOXYzine (ATARAX/VISTARIL) tablet 25 mg  25 mg Oral TID PRN Larita Fife, MD   25 mg at 05/03/20 0819  . hydrOXYzine (ATARAX/VISTARIL) tablet 50 mg  50 mg Oral QHS PRN Larita Fife, MD      . Derrill Memo ON 05/04/2020] lithium carbonate (ESKALITH) CR tablet 450 mg  450 mg Oral Daily Sharma Covert, MD      . lithium carbonate (LITHOBID) CR tablet 600 mg  600 mg Oral QPM Sharma Covert, MD      . magnesium hydroxide (MILK OF MAGNESIA) suspension 30 mL  30 mL Oral Daily PRN Alesia Morin, MD   30 mL at 04/30/20 1230  . nicotine (NICODERM CQ - dosed in mg/24 hours) patch 21 mg  21 mg Transdermal Q0600 Clapacs, Madie Reno, MD   21 mg at 05/02/20 0618  . OLANZapine (ZYPREXA) tablet 10 mg  10 mg Oral QHS Larita Fife, MD   10 mg at 05/02/20 2108  . OLANZapine (ZYPREXA)  tablet 5 mg  5 mg Oral q morning - 10a Larita Fife, MD   5 mg at 05/03/20 0817  . oxyCODONE (Oxy IR/ROXICODONE) immediate release tablet 5 mg  5 mg Oral Q6H PRN Clapacs, Madie Reno, MD   5 mg at 05/03/20 [id number]  . tiZANidine (ZANAFLEX) tablet 4 mg  4 mg Oral Q6H PRN Clapacs, Madie Reno, MD   4 mg at 04/30/20 1655   PTA Medications: Medications Prior to Admission  Medication Sig Dispense Refill Last Dose  . acetaminophen (TYLENOL) 500 MG tablet Take 2 tablets (1,000 mg total) by mouth every 8 (eight) hours. 180 tablet 0   . ciprofloxacin (CIPRO) 500 MG tablet Take 1 tablet (500 mg total) by mouth 2 (two) times daily for 5 days. 10 tablet 0   . doxycycline (VIBRAMYCIN) 50 MG capsule Take 2 capsules (100 mg total) by mouth 2 (two) times daily for 5 days. 20 capsule 0   . gabapentin (NEURONTIN) 600 MG tablet Take 2 tablets (1,200 mg total) by mouth 3 (three) times daily. 180 tablet 6   . hydrOXYzine (VISTARIL) 25 MG capsule Take 25 mg by mouth every 4 (four) hours as needed for anxiety.     Marland Kitchen lithium carbonate (ESKALITH) 450 MG CR tablet Take 450 mg by mouth 2 (two) times  daily.     Marland Kitchen OLANZapine (ZYPREXA) 10 MG tablet Take 5-10 mg by mouth 2 (two) times daily. 5MG -AM 10MG -PM     . oxyCODONE (OXY IR/ROXICODONE) 5 MG immediate release tablet Take 1 tablet (5 mg total) by mouth every 6 (six) hours as needed for severe pain. (Patient not taking: Reported on 04/26/2020) 15 tablet 0   . tiZANidine (ZANAFLEX) 4 MG tablet Take 4 mg by mouth every 6 (six) hours as needed for muscle spasms.       Patient Stressors: Financial difficulties Health problems Substance abuse  Patient Strengths: Average or above average intelligence Capable of independent living  Treatment Modalities: Medication Management, Group therapy, Case management,  1 to 1 session with clinician, Psychoeducation, Recreational therapy.   Physician Treatment Plan for Primary Diagnosis: Bipolar affective disorder, depressed, severe, with psychotic  behavior ([place]) Long Term Goal(s): Improvement in symptoms so as ready for discharge Improvement in symptoms so as ready for discharge   Short Term Goals: Ability to verbalize feelings will improve Ability to disclose and discuss suicidal ideas Ability to demonstrate self-control will improve Ability to maintain clinical measurements within normal limits will improve Compliance with prescribed medications will improve  Medication Management: Evaluate patient's response, side effects, and tolerance of medication regimen.  Therapeutic Interventions: 1 to 1 sessions, Unit Group sessions and Medication administration.  Evaluation of Outcomes: Progressing  Physician Treatment Plan for Secondary Diagnosis: Principal Problem:   Bipolar affective disorder, depressed, severe, with psychotic behavior ([place]) Active Problems:   Chronic back pain   Somatic symptom disorder   H/O hernia repair   Cocaine abuse ([place])  Long Term Goal(s): Improvement in symptoms so as ready for discharge Improvement in symptoms so as ready for discharge   Short Term Goals: Ability to verbalize feelings will improve Ability to disclose and discuss suicidal ideas Ability to demonstrate self-control will improve Ability to maintain clinical measurements within normal limits will improve Compliance with prescribed medications will improve     Medication Management: Evaluate patient's response, side effects, and tolerance of medication regimen.  Therapeutic Interventions: 1 to 1 sessions, Unit Group sessions and Medication administration.  Evaluation of Outcomes: Progressing   RN Treatment Plan for Primary Diagnosis: Bipolar affective disorder, depressed, severe, with psychotic behavior ([place]) Long Term Goal(s): Knowledge of disease and therapeutic regimen to maintain health will improve  Short Term Goals: Ability to participate in decision making will improve, Ability to verbalize feelings will improve, Ability to  disclose and discuss suicidal ideas, Ability to identify and develop effective coping behaviors will improve and Compliance with prescribed medications will improve  Medication Management: RN will administer medications as ordered by provider, will assess and evaluate patient's response and provide education to patient for prescribed medication. RN will report any adverse and/or side effects to prescribing provider.  Therapeutic Interventions: 1 on 1 counseling sessions, Psychoeducation, Medication administration, Evaluate responses to treatment, Monitor vital signs and CBGs as ordered, Perform/monitor CIWA, COWS, AIMS and Fall Risk screenings as ordered, Perform wound care treatments as ordered.  Evaluation of Outcomes: Progressing   LCSW Treatment Plan for Primary Diagnosis: Bipolar affective disorder, depressed, severe, with psychotic behavior ([place]) Long Term Goal(s): Safe transition to appropriate next level of care at discharge, Engage patient in therapeutic group addressing interpersonal concerns.  Short Term Goals: Engage patient in aftercare planning with referrals and resources, Increase social support, Increase emotional regulation, Facilitate acceptance of mental health diagnosis and concerns, Facilitate patient progression through stages of change regarding substance use  diagnoses and concerns and Identify triggers associated with mental health/substance abuse issues  Therapeutic Interventions: Assess for all discharge needs, 1 to 1 time with Social worker, Explore available resources and support systems, Assess for adequacy in community support network, Educate family and significant other(s) on suicide prevention, Complete Psychosocial Assessment, Interpersonal group therapy.  Evaluation of Outcomes: Progressing   Progress in Treatment: Attending groups: Yes. Participating in groups: Yes. Taking medication as prescribed: Yes. Toleration medication: Yes. Family/Significant other  contact made: Yes, individual(s) contacted:  SPE completed wiht patient.  Patient declined family contact. Patient understands diagnosis: Yes. Discussing patient identified problems/goals with staff: Yes. Medical problems stabilized or resolved: Yes. Denies suicidal/homicidal ideation: Yes. Issues/concerns per patient self-inventory: No. Other: none  New problem(s) identified: No, Describe:  none  New Short Term/Long Term Goal(s): detox, elimination of symptoms of psychosis, medication management for mood stabilization; elimination of SI thoughts; development of comprehensive mental wellness/sobriety plan.  Patient Goals:  "go to a treatment center for drugs and alcohol, maybe for my mental health too"  Discharge Plan or Barriers: Patient reports plans to discharge to [facility] and seek outpatient with [place].    Reason for Continuation of Hospitalization: Anxiety Depression Medication stabilization Suicidal ideation  Estimated Length of Stay: 1-7 days  Recreational Therapy: Patient Stressors: N/A Patient Goal: Patient will engage in groups without prompting or encouragement from LRT x3 group sessions within 5 recreation therapy group sessions  Attendees: Patient: Johnny [name] 05/03/2020 10:46 AM  Physician: Dr. Mallie Darting, MD 05/03/2020 10:46 AM  Nursing: West Pugh, RN 05/03/2020 10:46 AM  RN Care Manager: 05/03/2020 10:46 AM  Social Worker: Assunta Curtis, LCSW 05/03/2020 10:46 AM  Recreational Therapist: Devin Going, LRT 05/03/2020 10:46 AM  Other: Sanjuana Kava, LCSW 05/03/2020 10:46 AM  Other:  05/03/2020 10:46 AM  Other: 05/03/2020 10:46 AM    Scribe for Treatment Team: Rozann Lesches, LCSW 05/03/2020 10:46 AM

## 2020-05-03 NOTE — Progress Notes (Signed)
Recreation Therapy Notes  INPATIENT RECREATION THERAPY ASSESSMENT  Patient Details Name: Johnny [name]. MRN: [id number] DOB: 24-Jan-1971 Today's Date: 05/03/2020       Information Obtained From: Patient  Able to Participate in Assessment/Interview: Yes  Patient Presentation: Responsive  Reason for Admission (Per Patient): Active Symptoms  Patient Stressors:    Coping Skills:   TV, Exercise  Leisure Interests (2+):  Sports - Swimming, Film/video editor, Individual - TV  Frequency of Recreation/Participation:    Awareness of Community Resources:     [employer]:     Current Use:    If no, Barriers?:    Expressed Interest in [place] of Residence:  Insurance underwriter  Patient Main Form of Transportation: Walk  Patient Strengths:  easy going  Patient Identified Areas of Improvement:  Stay clean  Patient Goal for Hospitalization:  To get into treatment for drug and alcohol  Current SI (including self-harm):  No  Current HI:  No  Current AVH: No  Staff Intervention Plan: Group Attendance, Collaborate with Interdisciplinary Treatment Team  Consent to Intern Participation: N/A  Manar Smalling 05/03/2020, 3:54 PM

## 2020-05-03 NOTE — B[facility] Counselor (Signed)
CSW spoke with the patient about Healing Transitions as noted on handoff.  CSW informed patient that to qualify for the program you have to be a [facility] resident.  Assunta Curtis, [name], LCSW 05/03/2020 3:07 PM

## 2020-05-03 NOTE — [facility] Group Notes (Signed)
LCSW Group Therapy Note   05/03/2020 3:08 PM  Type of Therapy and Topic:  Group Therapy:  Overcoming Obstacles   Participation Level:  None   Description of Group:    In this group patients will be encouraged to explore what they see as obstacles to their own wellness and recovery. They will be guided to discuss their thoughts, feelings, and behaviors related to these obstacles. The group will process together ways to cope with barriers, with attention given to specific choices patients can make. Each patient will be challenged to identify changes they are motivated to make in order to overcome their obstacles. This group will be process-oriented, with patients participating in exploration of their own experiences as well as giving and receiving support and challenge from other group members.   Therapeutic Goals: 1. Patient will identify personal and current obstacles as they relate to admission. 2. Patient will identify barriers that currently interfere with their wellness or overcoming obstacles.  3. Patient will identify feelings, thought process and behaviors related to these barriers. 4. Patient will identify two changes they are willing to make to overcome these obstacles:      Summary of Patient Progress Patient was present in group, however did not engage in group discussions.    Therapeutic Modalities:   Cognitive Behavioral Therapy Solution Focused Therapy Motivational Interviewing Relapse Prevention Therapy  Assunta Curtis, [name], LCSW 05/03/2020 3:08 PM

## 2020-05-03 NOTE — Progress Notes (Signed)
[facility] MD Progress Note  05/03/2020 10:10 AM Johnny [name].  MRN:  [id number] Subjective: Patient is a 49 year old male who was admitted on 04/30/2020 after being transferred from the medical services unit.  He had been on medicine after being treated for a surgical site infection.  The patient stated to the admitting physician that "my paranoia got to me".  He described having difficulty with sleep, becoming more dysphoric, angry as well as irritable.  He admitted to cocaine use shortly before the hospitalization.  He was admitted to the hospital for stabilization.  Objective: Patient is seen and examined.  Patient is a 49 year old male with the above-stated past psychiatric history who is seen in follow-up.  He stated that last night he became significantly irritable.  He stated that he wanted to hurt someone who was causing problems on the unit.  Despite what it says in the notes he stated that the irritability was not usually a problem for him.Marland Kitchen  He denied any auditory or visual hallucinations.  He denied any suicidal or homicidal ideation.  He would like to go to a residential substance abuse treatment program after he is released.  His vital signs are stable, he is afebrile.  He slept 7.5 hours last night.  Review of his laboratories showed his creatinine on 5/6 to be 1.06.  Liver function enzymes showed a mild elevation of his AST.  His lithium level on 5/8 was 0.58.  Drug screen on admission was positive for benzodiazepines as well as cocaine.  Blood alcohol was less than 10.  A TSH has not been obtained.  His EKG showed a normal sinus rhythm with a QTc interval that was normal at 445.  Principal Problem: Bipolar affective disorder, depressed, severe, with psychotic behavior ([place]) Diagnosis: Principal Problem:   Bipolar affective disorder, depressed, severe, with psychotic behavior ([place]) Active Problems:   Chronic back pain   Somatic symptom disorder   H/O hernia repair   Cocaine abuse  ([place])  Total Time spent with patient: 20 minutes  Past Psychiatric History: See admission H&P  Past Medical History:  Past Medical History:  Diagnosis Date  . Arthritis    hands, knees, lower back  . Bilateral sciatica   . Carpal tunnel syndrome   . Chronic back pain   . Chronic neck pain   . Chronic pain 03/12/2020   has been connected to pain management clinics. currently waiting to see psychiatrist before he can join [facility]  . Chronic pain syndrome   . GERD (gastroesophageal reflux disease)   . Hand joint pain 07/02/2013  . Herniated disc   . Lives in homeless shelter 02/2020   housing ends as of 03/25/20. going to hotel after surgery  . Traumatic amputation of left index finger 2013  . Wears dentures    full upper and lower    Past Surgical History:  Procedure Laterality Date  . AMPUTATION FINGER / THUMB Left 2013  . COLONOSCOPY WITH PROPOFOL N/A 01/03/2018   Procedure: COLONOSCOPY WITH PROPOFOL;  Surgeon: [name], MD;  Location: [place];  Service: Endoscopy;  Laterality: N/A;  . ESOPHAGOGASTRODUODENOSCOPY (EGD) WITH PROPOFOL N/A 01/03/2018   Procedure: ESOPHAGOGASTRODUODENOSCOPY (EGD) WITH PROPOFOL;  Surgeon: [name], MD;  Location: [place];  Service: Endoscopy;  Laterality: N/A;  . HERNIA REPAIR Right    right inguinal hernia repaired twice  . HIP SURGERY  2006   took bone out of hip to put into neck  . INGUINAL  HERNIA REPAIR Left 04/23/2020   Procedure: HERNIA REPAIR INGUINAL ADULT;  Surgeon: [name], MD;  Location: [facility];  Service: General;  Laterality: Left;  . JOINT REPLACEMENT Right    knee  . KNEE SURGERY Right   . LUMBAR LAMINECTOMY/DECOMPRESSION MICRODISCECTOMY N/A 11/18/2019   Procedure: LUMBAR FOUR-FIVE LUMBAR LAMINECTOMY/DECOMPRESSION MICRODISCECTOMY;  Surgeon: [name], MD;  Location: [place];  Service: Neurosurgery;  Laterality: N/A;  . LUMBAR WOUND DEBRIDEMENT N/A 12/06/2019   Procedure: LUMBAR WOUND  REVISION;  Surgeon: [name], MD;  Location: [place];  Service: Neurosurgery;  Laterality: N/A;  . neck fusion    . POLYPECTOMY N/A 01/03/2018   Procedure: POLYPECTOMY;  Surgeon: [name], MD;  Location: [place];  Service: Endoscopy;  Laterality: N/A;  . REPAIR OF CEREBROSPINAL FLUID LEAK N/A 11/28/2019   Procedure: REPAIR OF CEREBROSPINAL FLUID LEAK/LUMBAR;  Surgeon: [name], MD;  Location: [place];  Service: Neurosurgery;  Laterality: N/A;  REPAIR OF CEREBROSPINAL FLUID LEAK/LUMBAR  . REPLACEMENT TOTAL KNEE Right   . SHOULDER SURGERY Right 2016 X 2  . SPINAL FUSION    . TONSILLECTOMY    . UMBILICAL HERNIA REPAIR N/A 11/07/2019   Procedure: HERNIA REPAIR UMBILICAL ADULT;  Surgeon: [name], MD;  Location: [facility];  Service: General;  Laterality: N/A;  . UMBILICAL HERNIA REPAIR N/A 04/23/2020   Procedure: HERNIA REPAIR UMBILICAL ADULT;  Surgeon: [name], MD;  Location: [facility];  Service: General;  Laterality: N/A;  . WOUND EXPLORATION N/A 12/15/2019   Procedure: LUMBAR WOUND EXPLORATION;  Surgeon: [name], MD;  Location: [place];  Service: Neurosurgery;  Laterality: N/A;  LUMBAR WOUND EXPLORATION   Family History:  Family History  Problem Relation Age of Onset  . Cancer Father        sarcoma  . Cirrhosis Paternal Grandmother   . Cancer Paternal Grandfather        Pancreatic  . Fibromyalgia Sister   . Deafness Sister    Family Psychiatric  History: See admission H&P Social History:  Social History   Substance and Sexual Activity  Alcohol Use No     Social History   Substance and Sexual Activity  Drug Use No    Social History   Socioeconomic History  . Marital status: Divorced    Spouse name: Not on file  . Number of children: Not on file  . Years of education: Not on file  . Highest education level: Not on file  Occupational History  . Occupation: delivery driver  Tobacco Use  . Smoking status: Current Every Day Smoker     Packs/day: 0.50    Years: 25.00    Pack years: 12.50    Types: Cigarettes  . Smokeless tobacco: Never Used  . Tobacco comment: will start Chantix soon.  Substance and Sexual Activity  . Alcohol use: No  . Drug use: No  . Sexual activity: Yes  Other Topics Concern  . Not on file  Social History Narrative   Patient currently lives in homeless shelter with 6 other men. This arrangement ends as of 03/25/2020.  Social Work has been aggressively working with patient regarding housing and financial issues. He will go to a hotel after surgery and has a friend named [name] that will stay with him. Unsure of where he will go after that day.   He has met with MD at [facility] pain clinic but will not receive any pain medicine from them until he sees a psychiatrist. This is  scheduled for May.  He is currently out of pain medicine and has frequented the ER for help.   Dr. [name] will be able to order post op medicine since he has not yet signed a contract.   Social Determinants of Health   Financial Resource Strain:   . Difficulty of Paying Living Expenses:   Food Insecurity:   . Worried About Charity fundraiser in the Last Year:   . Arboriculturist in the Last Year:   Transportation Needs:   . Film/video editor (Medical):   Marland Kitchen Lack of Transportation (Non-Medical):   Physical Activity:   . Days of Exercise per Week:   . Minutes of Exercise per Session:   Stress:   . Feeling of Stress :   Social Connections:   . Frequency of Communication with Friends and Family:   . Frequency of Social Gatherings with Friends and Family:   . Attends Religious Services:   . Active Member of Clubs or Organizations:   . Attends Archivist Meetings:   Marland Kitchen Marital Status:    Additional Social History:                         Sleep: Good  Appetite:  Good  Current Medications: Current Facility-Administered Medications  Medication Dose Route Frequency Provider Last Rate Last Admin  .  acetaminophen (TYLENOL) tablet 650 mg  650 mg Oral Q6H PRN Alesia Morin, MD   650 mg at 05/02/20 1832  . alum & mag hydroxide-simeth (MAALOX/MYLANTA) 200-200-20 MG/5ML suspension 30 mL  30 mL Oral Q4H PRN Alesia Morin, MD      . ciprofloxacin (CIPRO) tablet 500 mg  500 mg Oral BID Clapacs, Madie Reno, MD   500 mg at 05/03/20 0815  . doxycycline (VIBRA-TABS) tablet 100 mg  100 mg Oral Q12H Clapacs, Madie Reno, MD   100 mg at 05/03/20 0815  . gabapentin (NEURONTIN) capsule 1,200 mg  1,200 mg Oral TID Clapacs, John T, MD   1,200 mg at 05/03/20 0815  . hydrOXYzine (ATARAX/VISTARIL) tablet 25 mg  25 mg Oral TID PRN Larita Fife, MD   25 mg at 05/03/20 0819  . hydrOXYzine (ATARAX/VISTARIL) tablet 50 mg  50 mg Oral QHS PRN Larita Fife, MD      . lithium carbonate (ESKALITH) CR tablet 450 mg  450 mg Oral Q12H Clapacs, Madie Reno, MD   450 mg at 05/03/20 0815  . magnesium hydroxide (MILK OF MAGNESIA) suspension 30 mL  30 mL Oral Daily PRN Alesia Morin, MD   30 mL at 04/30/20 1230  . nicotine (NICODERM CQ - dosed in mg/24 hours) patch 21 mg  21 mg Transdermal Q0600 Clapacs, Madie Reno, MD   21 mg at 05/02/20 0618  . OLANZapine (ZYPREXA) tablet 10 mg  10 mg Oral QHS Larita Fife, MD   10 mg at 05/02/20 2108  . OLANZapine (ZYPREXA) tablet 5 mg  5 mg Oral q morning - 10a Larita Fife, MD   5 mg at 05/03/20 0817  . oxyCODONE (Oxy IR/ROXICODONE) immediate release tablet 5 mg  5 mg Oral Q6H PRN Clapacs, Madie Reno, MD   5 mg at 05/03/20 8250  . tiZANidine (ZANAFLEX) tablet 4 mg  4 mg Oral Q6H PRN Clapacs, Madie Reno, MD   4 mg at 04/30/20 1655    Lab Results: No results found for this or any previous visit (from the past 48 hour(s)).  Blood Alcohol level:  Lab Results  Component Value Date   ETH <10 04/24/2020   ETH <10 24/82/5003    Metabolic Disorder Labs: No results found for: HGBA1C, MPG No results found for: PROLACTIN Lab Results  Component Value Date   CHOL 137 04/29/2020   TRIG 104 04/29/2020   HDL 25 (L)  04/29/2020   CHOLHDL 5.5 04/29/2020   VLDL 21 04/29/2020   LDLCALC 91 04/29/2020   LDLCALC 67 06/19/2017    Physical Findings: AIMS:  , ,  ,  ,    CIWA:    COWS:     Musculoskeletal: Strength & Muscle Tone: within normal limits Gait & Station: normal Patient leans: N/A  Psychiatric Specialty Exam: Physical Exam  Nursing note and vitals reviewed. Constitutional: He is oriented to person, place, and time. He appears well-developed and well-nourished.  HENT:  Head: Normocephalic and atraumatic.  Respiratory: Effort normal.  Neurological: He is oriented to person, place, and time.    Review of Systems  Blood pressure (!) 124/96, pulse 77, temperature 97.9 F (36.6 C), temperature source Oral, resp. rate 18, height 6' (1.829 m), weight 90 kg, SpO2 100 %.Body mass index is 26.91 kg/m.  General Appearance: Casual  Eye Contact:  Minimal  Speech:  Normal Rate  Volume:  Decreased  Mood:  Dysphoric and Hopeless  Affect:  Congruent  Thought Process:  Coherent and Descriptions of Associations: Circumstantial  Orientation:  Full (Time, Place, and Person)  Thought Content:  Logical  Suicidal Thoughts:  No  Homicidal Thoughts:  No  Memory:  Immediate;   Fair Recent;   Fair Remote;   Fair  Judgement:  Impaired  Insight:  Fair  Psychomotor Activity:  Normal  Concentration:  Concentration: Fair and Attention Span: Fair  Recall:  [employer] of Knowledge:  Fair  Language:  Good  Akathisia:  Negative  Handed:  Right  AIMS (if indicated):     Assets:  Desire for Improvement Resilience  ADL's:  Intact  Cognition:  WNL  Sleep:  Number of Hours: 7.5     Treatment Plan Summary: Daily contact with patient to assess and evaluate symptoms and progress in treatment, Medication management and Plan : Patient is seen and examined.  Patient is a 49 year old male with the above-stated past psychiatric history who is seen in follow-up.  Diagnosis: #1 bipolar disorder, most recently  depressed, severe with psychotic features, #2 cocaine dependence, #3 alcohol use disorder, #4 reported somatic symptom disorder, #5 recent hernia repair, #6 chronic back pain  Patient is seen in follow-up.  He complains of increased irritability.  That may be secondary to coming off the cocaine, but also due to his bipolar disorder.  I will increase his lithium to 450 mg p.o. daily and 600 mg p.o. q. at bedtime.  No other changes in his medications.  When we check his lithium level after this increase we will make sure to check a TSH at that time.  He is concerned about his access to hydroxyzine.  The way the orders are written are for Vistaril 25 mg p.o. 3 times daily as needed anxiety and 50 mg p.o. nightly as needed insomnia.  He also continues on oxycodone 5 mg every 6 hours as needed moderate pain as well as tizanidine for muscle spasms.  He also continues on ciprofloxacin and doxycycline.  Ciprofloxacin has been known to cause some dysphoria patients, and that will have to be monitored as well.  He was also seen in treatment team today  and stated that he wanted to go to residential substance abuse treatment after this hospitalization.  1.  Continue ciprofloxacin 500 mg p.o. twice daily for infection. 2.  Continue doxycycline 100 mg p.o. twice daily for infection. 3.  Continue Neurontin 1200 mg p.o. 3 times daily for anxiety and chronic pain. 4.  Continue hydroxyzine 25 mg p.o. 3 times daily as needed anxiety and 50 mg p.o. nightly as needed insomnia. 5.  Increase lithium carbonate CR to 450 mg p.o. daily and 600 mg p.o. every afternoon.  This is for mood stability. 6.  Continue olanzapine 5 mg p.o. every morning and 10 mg p.o. nightly for psychosis and mood stability. 7.  Continue oxycodone 5 mg p.o. every 6 hours as needed pain. 8.  Continue tizanidine 4 mg p.o. every 6 hours as needed muscle spasms or back pain. 9.  Patient has expressed desire to go to residential subs abuse treatment, and this  will be referred to social work to inquire. 10.  Disposition planning-in progress.  Sharma Covert, MD 05/03/2020, 10:10 AM

## 2020-05-03 NOTE — Plan of Care (Signed)
Patient rated his depression and anxiety 8/10.Complaints that his meds are messed up.Patient easily get irritated and shouted at one of peers for making noise while he was watching TV.Denies SI,HI and AVH.Appetite and energy level good.Support and encouragement given.

## 2020-05-04 MED ORDER — OLANZAPINE 5 MG PO TABS: 5.0000 mg | ORAL_TABLET | Freq: Every morning | ORAL | 0 refills | Status: DC

## 2020-05-04 MED ORDER — LITHIUM CARBONATE ER 300 MG PO TBCR: 600.0000 mg | EXTENDED_RELEASE_TABLET | Freq: Every evening | ORAL | 0 refills | Status: DC

## 2020-05-04 MED ORDER — HYDROXYZINE HCL 25 MG PO TABS: 25.0000 mg | ORAL_TABLET | Freq: Three times a day (TID) | ORAL | 0 refills | Status: DC | PRN

## 2020-05-04 MED ORDER — GABAPENTIN 400 MG PO CAPS: 1200.0000 mg | ORAL_CAPSULE | Freq: Three times a day (TID) | ORAL | 0 refills | Status: DC

## 2020-05-04 MED ORDER — TIZANIDINE HCL 4 MG PO TABS: 4.0000 mg | ORAL_TABLET | Freq: Four times a day (QID) | ORAL | 0 refills | Status: DC | PRN

## 2020-05-04 MED ORDER — OLANZAPINE 10 MG PO TABS: 10.0000 mg | ORAL_TABLET | Freq: Every day | ORAL | 0 refills | Status: DC

## 2020-05-04 MED ORDER — HYDROXYZINE HCL 50 MG PO TABS: 50.0000 mg | ORAL_TABLET | Freq: Every evening | ORAL | 0 refills | Status: DC | PRN

## 2020-05-04 MED ORDER — LITHIUM CARBONATE ER 450 MG PO TBCR: 450.0000 mg | EXTENDED_RELEASE_TABLET | Freq: Every day | ORAL | 0 refills | Status: DC

## 2020-05-04 NOTE — Progress Notes (Signed)
Recreation Therapy Notes  INPATIENT RECREATION TR PLAN  Patient Details Name: Johnny Elbert Eidem Jr. MRN: [id number] DOB: 01/19/1971 Today's Date: 05/04/2020  Rec Therapy Plan Is patient appropriate for Therapeutic Recreation?: Yes Treatment times per week: at least 3 Estimated Length of Stay: 5-7 days TR Treatment/Interventions: Group participation (Comment)  Discharge Criteria Pt will be discharged from therapy if:: Discharged Treatment plan/goals/alternatives discussed and agreed upon by:: Patient/family  Discharge Summary Short term goals set: Patient will engage in groups without prompting or encouragement from LRT x3 group sessions within 5 recreation therapy group sessions Short term goals met: Adequate for discharge Progress toward goals comments: Groups attended Which groups?: Other (Comment)(Happiness) Reason goals not met: N/A Therapeutic equipment acquired: N/A Reason patient discharged from therapy: Discharge from hospital Pt/family agrees with progress & goals achieved: Yes Date patient discharged from therapy: 05/04/20      05/04/2020, 12:11 PM  

## 2020-05-04 NOTE — Progress Notes (Signed)
Care of patient taken over at 11pm. Patient asleep, eyes closed in no distress. Patient remained asleep for remainder of shift. Remains safe on unit with q 15 min checks.

## 2020-05-04 NOTE — Plan of Care (Signed)
  Problem: Group Participation Goal: STG - Patient will engage in groups without prompting or encouragement from LRT x3 group sessions within 5 recreation therapy group sessions Description: STG - Patient will engage in groups without prompting or encouragement from LRT x3 group sessions within 5 recreation therapy group sessions 05/04/2020 1209 by Ernest Haber, LRT Outcome: Adequate for Discharge 05/04/2020 1209 by Ernest Haber, LRT Outcome: Adequate for Discharge

## 2020-05-04 NOTE — Discharge Summary (Signed)
Physician Discharge Summary Note  Patient:  Johnny [name]. is an 49 y.o., male MRN:  [id number] DOB:  06/08/1971 Patient phone:  [phone] (home)  Patient address:   [address] [place] [address],  Total Time spent with patient: 20 minutes  Date of Admission:  04/29/2020 Date of Discharge: 05/04/2020  Reason for Admission: Patient is a 49 year old male who was transferred to the psychiatric unit from the medical service after having been treated for surgical site infection.  He complained of mood symptoms, anxiety symptoms and psychotic symptoms.  He had recently relapsed on crack cocaine.  Principal Problem: Bipolar affective disorder, depressed, severe, with psychotic behavior ([place]) Discharge Diagnoses: Principal Problem:   Bipolar affective disorder, depressed, severe, with psychotic behavior ([place]) Active Problems:   Chronic back pain   Somatic symptom disorder   H/O hernia repair   Cocaine abuse ([name])   Past Psychiatric History: Apparently he had never had psychiatric hospitalization prior to going to [facility] last month.  Denies before that had varying ever seen a psychiatrist or mental health provider although he says that there was a time in the past when he used to cut himself frequently.  Denies any prior diagnosis with bipolar disorder or psychotic disorder.  Patient has an extensive history of substance abuse problems stating that for years he drank very heavily and only cut back about 10 years ago although he says he still drinks several times a week.  He has been using crack cocaine recently.  Patient has a long and quite striking history of opiate use.  Reviewing his presentations mostly to [facility] for medical problems it is remarkable the variety and number of injuries he had with attempts to get narcotics.  Review of the controlled substance database shows multiple prescriptions coming from multiple providers for short courses of opiate medicine.  Despite  this it does not seem like anyone has ever diagnosed him with opiate abuse although reading between the lines of some of the old notes it certainly looks like they were concerned about his use of narcotics at times.  Patient denies ever having seriously tried to kill himself.  Past Medical History:  Past Medical History:  Diagnosis Date  . Arthritis    hands, knees, lower back  . Bilateral sciatica   . Carpal tunnel syndrome   . Chronic back pain   . Chronic neck pain   . Chronic pain 03/12/2020   has been connected to pain management clinics. currently waiting to see psychiatrist before he can join [facility]  . Chronic pain syndrome   . GERD (gastroesophageal reflux disease)   . Hand joint pain 07/02/2013  . Herniated disc   . Lives in homeless shelter 02/2020   housing ends as of 03/25/20. going to hotel after surgery  . Traumatic amputation of left index finger 2013  . Wears dentures    full upper and lower    Past Surgical History:  Procedure Laterality Date  . AMPUTATION FINGER / THUMB Left 2013  . COLONOSCOPY WITH PROPOFOL N/A 01/03/2018   Procedure: COLONOSCOPY WITH PROPOFOL;  Surgeon: [name], MD;  Location: [place];  Service: Endoscopy;  Laterality: N/A;  . ESOPHAGOGASTRODUODENOSCOPY (EGD) WITH PROPOFOL N/A 01/03/2018   Procedure: ESOPHAGOGASTRODUODENOSCOPY (EGD) WITH PROPOFOL;  Surgeon: [name], MD;  Location: [place];  Service: Endoscopy;  Laterality: N/A;  . HERNIA REPAIR Right    right inguinal hernia repaired twice  . HIP SURGERY  2006   took bone out of hip to put into neck  . INGUINAL HERNIA REPAIR Left 04/23/2020   Procedure: HERNIA REPAIR INGUINAL ADULT;  Surgeon: [name], MD;  Location: [facility];  Service: General;  Laterality: Left;  . JOINT REPLACEMENT Right    knee  . KNEE SURGERY Right   . LUMBAR LAMINECTOMY/DECOMPRESSION MICRODISCECTOMY N/A 11/18/2019   Procedure: LUMBAR FOUR-FIVE LUMBAR LAMINECTOMY/DECOMPRESSION  MICRODISCECTOMY;  Surgeon: [name], MD;  Location: [place];  Service: Neurosurgery;  Laterality: N/A;  . LUMBAR WOUND DEBRIDEMENT N/A 12/06/2019   Procedure: LUMBAR WOUND REVISION;  Surgeon: [name], MD;  Location: [place];  Service: Neurosurgery;  Laterality: N/A;  . neck fusion    . POLYPECTOMY N/A 01/03/2018   Procedure: POLYPECTOMY;  Surgeon: [name], MD;  Location: [place];  Service: Endoscopy;  Laterality: N/A;  . REPAIR OF CEREBROSPINAL FLUID LEAK N/A 11/28/2019   Procedure: REPAIR OF CEREBROSPINAL FLUID LEAK/LUMBAR;  Surgeon: [name], MD;  Location: [place];  Service: Neurosurgery;  Laterality: N/A;  REPAIR OF CEREBROSPINAL FLUID LEAK/LUMBAR  . REPLACEMENT TOTAL KNEE Right   . SHOULDER SURGERY Right 2016 X 2  . SPINAL FUSION    . TONSILLECTOMY    . UMBILICAL HERNIA REPAIR N/A 11/07/2019   Procedure: HERNIA REPAIR UMBILICAL ADULT;  Surgeon: [name], MD;  Location: [facility];  Service: General;  Laterality: N/A;  . UMBILICAL HERNIA REPAIR N/A 04/23/2020   Procedure: HERNIA REPAIR UMBILICAL ADULT;  Surgeon: [name], MD;  Location: [facility];  Service: General;  Laterality: N/A;  . WOUND EXPLORATION N/A 12/15/2019   Procedure: LUMBAR WOUND EXPLORATION;  Surgeon: [name], MD;  Location: [place];  Service: Neurosurgery;  Laterality: N/A;  LUMBAR WOUND EXPLORATION   Family History:  Family History  Problem Relation Age of Onset  . Cancer Father        sarcoma  . Cirrhosis Paternal Grandmother   . Cancer Paternal Grandfather        Pancreatic  . Fibromyalgia Sister   . Deafness Sister    Family Psychiatric  History: Noncontributory Social History:  Social History   Substance and Sexual Activity  Alcohol Use No     Social History   Substance and Sexual Activity  Drug Use No    Social History   Socioeconomic History  . Marital status: Divorced    Spouse name: Not on file  . Number of children: Not on file  . Years of  education: Not on file  . Highest education level: Not on file  Occupational History  . Occupation: delivery driver  Tobacco Use  . Smoking status: Current Every Day Smoker    Packs/day: 0.50    Years: 25.00    Pack years: 12.50    Types: Cigarettes  . Smokeless tobacco: Never Used  . Tobacco comment: will start Chantix soon.  Substance and Sexual Activity  . Alcohol use: No  . Drug use: No  . Sexual activity: Yes  Other Topics Concern  . Not on file  Social History Narrative   Patient currently lives in homeless shelter with 6 other men. This arrangement ends as of 03/25/2020.  Social Work has been aggressively working with patient regarding housing and financial issues. He will go to a hotel after surgery and has a friend named [name] that will stay with him. Unsure of where he will go after that day.   He has met with MD at [facility] pain clinic but will not  receive any pain medicine from them until he sees a psychiatrist. This is scheduled for May.  He is currently out of pain medicine and has frequented the ER for help.   Dr. [name] will be able to order post op medicine since he has not yet signed a contract.   Social Determinants of Health   Financial Resource Strain:   . Difficulty of Paying Living Expenses:   Food Insecurity:   . Worried About Charity fundraiser in the Last Year:   . Arboriculturist in the Last Year:   Transportation Needs:   . Film/video editor (Medical):   Marland Kitchen Lack of Transportation (Non-Medical):   Physical Activity:   . Days of Exercise per Week:   . Minutes of Exercise per Session:   Stress:   . Feeling of Stress :   Social Connections:   . Frequency of Communication with Friends and Family:   . Frequency of Social Gatherings with Friends and Family:   . Attends Religious Services:   . Active Member of Clubs or Organizations:   . Attends Archivist Meetings:   Marland Kitchen Marital Status:     Hospital Course: Patient was admitted to the  psychiatric service on 04/30/2020.  He had previously successfully been treated with Zyprexa and lithium.  These were restarted.  His Zyprexa was increased during the course of the hospitalization as well as his lithium.  I saw the patient originally on 05/03/2020.  At that time he complained of continued irritability.  His lithium was increased to 450 mg p.o. daily and 600 mg p.o. nightly.  He continued on Neurontin 1200 mg p.o. 3 times daily for chronic pain issues.  He was interested in getting involved in a residential substance abuse treatment program.  He was accepted into the [facility] 1 year program.  On the date of discharge he denied any auditory or visual hallucinations.  He denied any suicidal or homicidal ideation.  He does have some somatization disorder issues, and continued to complain of concern about his abdominal hernia.  He received a 5-day course of antibiotics per the hospitalist service.  He completed that treatment.  On the date of discharge she requested that the hospitalist service "take a look at" the abdominal hernia surgical wound.  On my examination it appeared to be within normal limits, but that the hernia was still present.  He was afebrile throughout the course the hospitalization.  His lithium level from 05/01/2020 was only 0.58.  His creatinine was 1.06.  TSH had not been done with his lithium, but I recommended that he get a follow-up lithium level, TSH and metabolic panel with his psychiatric follow-up.  It was decided he could be discharged this date.  Physical Findings: AIMS:  , ,  ,  ,    CIWA:    COWS:     Musculoskeletal: Strength & Muscle Tone: within normal limits Gait & Station: normal Patient leans: N/A  Psychiatric Specialty Exam: Physical Exam  Nursing note and vitals reviewed. Constitutional: He is oriented to person, place, and time. He appears well-developed and well-nourished.  HENT:  Head: Normocephalic and atraumatic.  Respiratory:  Effort normal.  Neurological: He is alert and oriented to person, place, and time.    Review of Systems  Blood pressure 116/83, pulse 72, temperature 98.1 F (36.7 C), temperature source Oral, resp. rate 16, height 6' (1.829 m), weight 90 kg, SpO2 98 %.Body mass index is 26.91 kg/m.  General Appearance: Casual  Eye Contact:  Good  Speech:  Normal Rate  Volume:  Normal  Mood:  Anxious  Affect:  Congruent  Thought Process:  Coherent and Descriptions of Associations: Intact  Orientation:  Full (Time, Place, and Person)  Thought Content:  Logical and Rumination  Suicidal Thoughts:  No  Homicidal Thoughts:  No  Memory:  Immediate;   Good Recent;   Good Remote;   Good  Judgement:  Intact  Insight:  Fair  Psychomotor Activity:  Normal  Concentration:  Concentration: Good and Attention Span: Good  Recall:  Good  Fund of Knowledge:  Good  Language:  Good  Akathisia:  Negative  Handed:  Right  AIMS (if indicated):     Assets:  Desire for Improvement Resilience  ADL's:  Intact  Cognition:  WNL  Sleep:  Number of Hours: 8        Has this patient used any form of tobacco in the last 30 days? (Cigarettes, Smokeless Tobacco, Cigars, and/or Pipes) Yes, No  Blood Alcohol level:  Lab Results  Component Value Date   ETH <10 04/24/2020   ETH <10 28/41/3244    Metabolic Disorder Labs:  No results found for: HGBA1C, MPG No results found for: PROLACTIN Lab Results  Component Value Date   CHOL 137 04/29/2020   TRIG 104 04/29/2020   HDL 25 (L) 04/29/2020   CHOLHDL 5.5 04/29/2020   VLDL 21 04/29/2020   [place] 91 04/29/2020   [place] 67 06/19/2017    See Psychiatric Specialty Exam and Suicide Risk Assessment completed by Attending Physician prior to discharge.  Discharge destination:  Other:  [place] [facility]  Is patient on multiple antipsychotic therapies at discharge:  No   Has Patient had three or more failed trials of antipsychotic monotherapy by history:   No  Recommended Plan for Multiple Antipsychotic Therapies: NA  Discharge Instructions    Increase activity slowly   Complete by: As directed    Increase activity slowly   Complete by: As directed      Allergies as of 05/04/2020      Reactions   Crab [shellfish Allergy] Itching, Swelling   Flexeril [cyclobenzaprine] Anaphylaxis   Codeine Itching   Latex Swelling   Gloves(when worn), condoms   Penicillins Nausea And Vomiting   Did it involve swelling of the face/tongue/throat, SOB, or low BP? No Did it involve sudden or severe rash/hives, skin peeling, or any reaction on the inside of your mouth or nose? No Did you need to seek medical attention at a hospital or doctor's office? No When did it last happen?10+years If all above answers are "NO", may proceed with cephalosporin use.   Robaxin [methocarbamol] Itching   Sulfamethoxazole-trimethoprim Nausea Only, Other (See Comments)   Stomach pain    Tramadol Other (See Comments)   Gives headaches      Medication List    STOP taking these medications   acetaminophen 500 MG tablet Commonly known as: TYLENOL   ciprofloxacin 500 MG tablet Commonly known as: Cipro   doxycycline 50 MG capsule Commonly known as: VIBRAMYCIN   gabapentin 600 MG tablet Commonly known as: NEURONTIN Replaced by: gabapentin 400 MG capsule   hydrOXYzine 25 MG capsule Commonly known as: VISTARIL   oxyCODONE 5 MG immediate release tablet Commonly known as: Oxy IR/ROXICODONE     TAKE these medications     Indication  gabapentin 400 MG capsule Commonly known as: NEURONTIN Take 3 capsules (1,200 mg total) by mouth 3 (  three) times daily. Replaces: gabapentin 600 MG tablet  Indication: anxiety disorder   hydrOXYzine 25 MG tablet Commonly known as: ATARAX/VISTARIL Take 1 tablet (25 mg total) by mouth 3 (three) times daily as needed (anxiety, sleep).  Indication: Feeling Anxious   hydrOXYzine 50 MG tablet Commonly known as:  ATARAX/VISTARIL Take 1 tablet (50 mg total) by mouth at bedtime as needed (sleep).  Indication: Feeling Anxious   lithium carbonate 300 MG CR tablet Commonly known as: LITHOBID Take 2 tablets (600 mg total) by mouth every evening. What changed: You were already taking a medication with the same name, and this prescription was added. Make sure you understand how and when to take each.  Indication: Manic-Depression   lithium carbonate 450 MG CR tablet Commonly known as: ESKALITH Take 1 tablet (450 mg total) by mouth daily. Start taking on: May 05, 2020 What changed: when to take this  Indication: Manic-Depression   OLANZapine 10 MG tablet Commonly known as: ZYPREXA Take 1 tablet (10 mg total) by mouth at bedtime. What changed: You were already taking a medication with the same name, and this prescription was added. Make sure you understand how and when to take each.  Indication: Depressive Phase of Manic-Depression   OLANZapine 5 MG tablet Commonly known as: ZYPREXA Take 1 tablet (5 mg total) by mouth every morning. Start taking on: May 05, 2020 What changed:   medication strength  how much to take  when to take this  additional instructions  Indication: Depressive Phase of Manic-Depression   tiZANidine 4 MG tablet Commonly known as: ZANAFLEX Take 1 tablet (4 mg total) by mouth every 6 (six) hours as needed for muscle spasms.  Indication: Musculoskeletal Pain      Follow-up Information    [facility] Follow up.   Why: The walk in clinic hours are Monday-Friday at 830am, it is  first come, first serve, so please arrive on time. Please bring photo ID, insurance card, hospital discharge paperwork. Thank you. Contact information: [address]  [place], [place] [address]  Phone: [phone]           Follow-up recommendations:  Activity:  ad lib  Comments: On his psychiatric or medical follow-up he needs a lithium level, TSH and metabolic panel to follow-up  for his lithium treatment.  Signed: Sharma Covert, MD 05/04/2020, 11:52 AM

## 2020-05-04 NOTE — Plan of Care (Signed)
Ready for dc Problem: Education: Goal: Knowledge of [facility] Education information/materials will improve 05/04/2020 0939 by Kieth Brightly, RN Outcome: Adequate for Discharge 05/04/2020 0932 by Kieth Brightly, RN Outcome: Progressing Goal: Emotional status will improve 05/04/2020 0939 by Kieth Brightly, RN Outcome: Adequate for Discharge 05/04/2020 0932 by Kieth Brightly, RN Outcome: Progressing Goal: Mental status will improve 05/04/2020 0939 by Kieth Brightly, RN Outcome: Adequate for Discharge 05/04/2020 0932 by Kieth Brightly, RN Outcome: Progressing Goal: Verbalization of understanding the information provided will improve 05/04/2020 0939 by Kieth Brightly, RN Outcome: Adequate for Discharge 05/04/2020 0932 by Kieth Brightly, RN Outcome: Progressing   Problem: Activity: Goal: Interest or engagement in activities will improve 05/04/2020 0939 by Kieth Brightly, RN Outcome: Adequate for Discharge 05/04/2020 0932 by Kieth Brightly, RN Outcome: Progressing Goal: Sleeping patterns will improve 05/04/2020 0939 by Kieth Brightly, RN Outcome: Adequate for Discharge 05/04/2020 0932 by Kieth Brightly, RN Outcome: Progressing   Problem: Coping: Goal: Ability to verbalize frustrations and anger appropriately will improve 05/04/2020 0939 by Kieth Brightly, RN Outcome: Adequate for Discharge 05/04/2020 0932 by Kieth Brightly, RN Outcome: Progressing Goal: Ability to demonstrate self-control will improve 05/04/2020 0939 by Kieth Brightly, RN Outcome: Adequate for Discharge 05/04/2020 0932 by Kieth Brightly, RN Outcome: Progressing   Problem: Health Behavior/Discharge Planning: Goal: Identification of resources available to assist in meeting health care needs will improve 05/04/2020 0939 by Kieth Brightly, RN Outcome: Adequate for Discharge 05/04/2020 0932 by Kieth Brightly, RN Outcome: Progressing Goal: Compliance with treatment plan for underlying cause of  condition will improve 05/04/2020 0939 by Kieth Brightly, RN Outcome: Adequate for Discharge 05/04/2020 0932 by Kieth Brightly, RN Outcome: Progressing   Problem: Physical Regulation: Goal: Ability to maintain clinical measurements within normal limits will improve 05/04/2020 0939 by Kieth Brightly, RN Outcome: Adequate for Discharge 05/04/2020 0932 by Kieth Brightly, RN Outcome: Progressing   Problem: Safety: Goal: Periods of time without injury will increase 05/04/2020 0939 by Kieth Brightly, RN Outcome: Adequate for Discharge 05/04/2020 0932 by Kieth Brightly, RN Outcome: Progressing   Problem: Education: Goal: Utilization of techniques to improve thought processes will improve 05/04/2020 0939 by Kieth Brightly, RN Outcome: Adequate for Discharge 05/04/2020 0932 by Kieth Brightly, RN Outcome: Progressing Goal: Knowledge of the prescribed therapeutic regimen will improve 05/04/2020 0939 by Kieth Brightly, RN Outcome: Adequate for Discharge 05/04/2020 0932 by Kieth Brightly, RN Outcome: Progressing   Problem: Activity: Goal: Interest or engagement in leisure activities will improve 05/04/2020 0939 by Kieth Brightly, RN Outcome: Adequate for Discharge 05/04/2020 0932 by Kieth Brightly, RN Outcome: Progressing Goal: Imbalance in normal sleep/wake cycle will improve 05/04/2020 0939 by Kieth Brightly, RN Outcome: Adequate for Discharge 05/04/2020 0932 by Kieth Brightly, RN Outcome: Progressing   Problem: Coping: Goal: Coping ability will improve 05/04/2020 0939 by Kieth Brightly, RN Outcome: Adequate for Discharge 05/04/2020 0932 by Kieth Brightly, RN Outcome: Progressing Goal: Will verbalize feelings 05/04/2020 0939 by Kieth Brightly, RN Outcome: Adequate for Discharge 05/04/2020 0932 by Kieth Brightly, RN Outcome: Progressing   Problem: Health Behavior/Discharge Planning: Goal: Ability to make decisions will improve 05/04/2020 0939 by Kieth Brightly,  RN Outcome: Adequate for Discharge 05/04/2020 0932 by Kieth Brightly, RN Outcome: Progressing Goal: Compliance with therapeutic regimen will improve 05/04/2020 0939 by Kieth Brightly, RN Outcome: Adequate  for Discharge 05/04/2020 0932 by Kieth Brightly, RN Outcome: Progressing   Problem: Role Relationship: Goal: Will demonstrate positive changes in social behaviors and relationships 05/04/2020 0939 by Kieth Brightly, RN Outcome: Adequate for Discharge 05/04/2020 0932 by Kieth Brightly, RN Outcome: Progressing   Problem: Safety: Goal: Ability to disclose and discuss suicidal ideas will improve 05/04/2020 0939 by Kieth Brightly, RN Outcome: Adequate for Discharge 05/04/2020 0932 by Kieth Brightly, RN Outcome: Progressing Goal: Ability to identify and utilize support systems that promote safety will improve 05/04/2020 0939 by Kieth Brightly, RN Outcome: Adequate for Discharge 05/04/2020 0932 by Kieth Brightly, RN Outcome: Progressing   Problem: Self-Concept: Goal: Will verbalize positive feelings about self 05/04/2020 0939 by Kieth Brightly, RN Outcome: Adequate for Discharge 05/04/2020 0932 by Kieth Brightly, RN Outcome: Progressing Goal: Level of anxiety will decrease 05/04/2020 0939 by Kieth Brightly, RN Outcome: Adequate for Discharge 05/04/2020 0932 by Kieth Brightly, RN Outcome: Progressing   Problem: Activity: Goal: Will verbalize the importance of balancing activity with adequate rest periods 05/04/2020 0939 by Kieth Brightly, RN Outcome: Adequate for Discharge 05/04/2020 0932 by Kieth Brightly, RN Outcome: Progressing   Problem: Education: Goal: Will be free of psychotic symptoms 05/04/2020 0939 by Kieth Brightly, RN Outcome: Adequate for Discharge 05/04/2020 0932 by Kieth Brightly, RN Outcome: Progressing Goal: Knowledge of the prescribed therapeutic regimen will improve 05/04/2020 0939 by Kieth Brightly, RN Outcome: Adequate for Discharge 05/04/2020  0932 by Kieth Brightly, RN Outcome: Progressing   Problem: Coping: Goal: Coping ability will improve 05/04/2020 0939 by Kieth Brightly, RN Outcome: Adequate for Discharge 05/04/2020 0932 by Kieth Brightly, RN Outcome: Progressing Goal: Will verbalize feelings 05/04/2020 0939 by Kieth Brightly, RN Outcome: Adequate for Discharge 05/04/2020 0932 by Kieth Brightly, RN Outcome: Progressing   Problem: Health Behavior/Discharge Planning: Goal: Compliance with prescribed medication regimen will improve 05/04/2020 0939 by Kieth Brightly, RN Outcome: Adequate for Discharge 05/04/2020 0932 by Kieth Brightly, RN Outcome: Progressing   Problem: Nutritional: Goal: Ability to achieve adequate nutritional intake will improve 05/04/2020 0939 by Kieth Brightly, RN Outcome: Adequate for Discharge 05/04/2020 0932 by Kieth Brightly, RN Outcome: Progressing   Problem: Role Relationship: Goal: Ability to communicate needs accurately will improve 05/04/2020 0939 by Kieth Brightly, RN Outcome: Adequate for Discharge 05/04/2020 0932 by Kieth Brightly, RN Outcome: Progressing Goal: Ability to interact with others will improve 05/04/2020 0939 by Kieth Brightly, RN Outcome: Adequate for Discharge 05/04/2020 0932 by Kieth Brightly, RN Outcome: Progressing   Problem: Self-Care: Goal: Ability to participate in self-care as condition permits will improve 05/04/2020 0939 by Kieth Brightly, RN Outcome: Adequate for Discharge 05/04/2020 0932 by Kieth Brightly, RN Outcome: Progressing   Problem: Self-Concept: Goal: Will verbalize positive feelings about self 05/04/2020 0939 by Kieth Brightly, RN Outcome: Adequate for Discharge 05/04/2020 0932 by Kieth Brightly, RN Outcome: Progressing

## 2020-05-04 NOTE — Progress Notes (Signed)
  [facility] Adult Case Management Discharge Plan :  Will you be returning to the same living situation after discharge:  No. Pt request referral to the [facility]. At discharge, do you have transportation home?: Yes,  safe transport Do you have the ability to pay for your medications: Yes,  Medicaid  Release of information consent forms completed and in the chart;  Patient's signature needed at discharge.  Patient to Follow up at: Follow-up Information    [facility] Follow up.   Why: The walk in clinic hours are Monday-Friday at 830am, it is  first come, first serve, so please arrive on time. Please bring photo ID, insurance card, hospital discharge paperwork. Thank you. Contact information: [address]  [place], [place] [address]  Phone: [phone]           Next level of care provider has access to [place] and Suicide Prevention discussed: Yes,  with pt; declined family contact     Has patient been referred to the Quitline?: Patient refused referral  Patient has been referred for addiction treatment: N/A  Yvette Rack, LCSW 05/04/2020, 9:39 AM

## 2020-05-04 NOTE — [facility] Group Notes (Signed)
Feelings Around Relapse 05/04/2020 1PM  Type of Therapy and Topic:  Group Therapy:  Feelings around Relapse and Recovery  Participation Level:  Active   Description of Group:    Patients in this group will discuss emotions they experience before and after a relapse. They will process how experiencing these feelings, or avoidance of experiencing them, relates to having a relapse. Facilitator will guide patients to explore emotions they have related to recovery. Patients will be encouraged to process which emotions are more powerful. They will be guided to discuss the emotional reaction significant others in their lives may have to patients' relapse or recovery. Patients will be assisted in exploring ways to respond to the emotions of others without this contributing to a relapse.  Therapeutic Goals: 1. Patient will identify two or more emotions that lead to a relapse for them 2. Patient will identify two emotions that result when they relapse 3. Patient will identify two emotions related to recovery 4. Patient will demonstrate ability to communicate their needs through discussion and/or role plays   Summary of Patient Progress: Actively and appropriately engaged in the group. Patient was able to provide support and validation to other group members.Patient practiced active listening when interacting with the facilitator and other group members. *    Therapeutic Modalities:   Cognitive Behavioral Therapy Solution-Focused Therapy Assertiveness Training Relapse Prevention Therapy   Yvette Rack, LCSW 05/04/2020 2:26 PM

## 2020-05-04 NOTE — Plan of Care (Signed)
  Problem: Safety: Goal: Periods of time without injury will increase Outcome: Progressing   

## 2020-05-04 NOTE — [facility] Suicide Risk Assessment (Signed)
[facility] Discharge Suicide Risk Assessment   Principal Problem: Bipolar affective disorder, depressed, severe, with psychotic behavior ([place]) Discharge Diagnoses: Principal Problem:   Bipolar affective disorder, depressed, severe, with psychotic behavior ([place]) Active Problems:   Chronic back pain   Somatic symptom disorder   H/O hernia repair   Cocaine abuse ([place])   Total Time spent with patient: 20 minutes  Musculoskeletal: Strength & Muscle Tone: within normal limits Gait & Station: normal Patient leans: N/A  Psychiatric Specialty Exam: Review of Systems  All other systems reviewed and are negative.   Blood pressure 116/83, pulse 72, temperature 98.1 F (36.7 C), temperature source Oral, resp. rate 16, height 6' (1.829 m), weight 90 kg, SpO2 98 %.Body mass index is 26.91 kg/m.  General Appearance: Casual  Eye Contact::  Fair  Speech:  Normal Rate409  Volume:  Normal  Mood:  Anxious  Affect:  Congruent  Thought Process:  Coherent and Descriptions of Associations: Intact  Orientation:  Full (Time, Place, and Person)  Thought Content:  Logical  Suicidal Thoughts:  No  Homicidal Thoughts:  No  Memory:  Immediate;   Fair Recent;   Fair Remote;   Fair  Judgement:  Intact  Insight:  Fair  Psychomotor Activity:  Increased  Concentration:  Fair  Recall:  [employer] of Knowledge:Good  Language: Good  Akathisia:  Negative  Handed:  Right  AIMS (if indicated):     Assets:  Desire for Improvement Resilience  Sleep:  Number of Hours: 8  Cognition: WNL  ADL's:  Intact   Mental Status Per Nursing Assessment::   On Admission:  Suicide plan, Belief that plan would result in death  Demographic Factors:  Male, Caucasian, Low socioeconomic status and Unemployed  Loss Factors: Financial problems/change in socioeconomic status  Historical Factors: Impulsivity  Risk Reduction Factors:   NA  Continued Clinical Symptoms:  Bipolar Disorder:   Mixed State Alcohol/Substance  Abuse/Dependencies  Cognitive Features That Contribute To Risk:  None    Suicide Risk:  Minimal: No identifiable suicidal ideation.  Patients presenting with no risk factors but with morbid ruminations; may be classified as minimal risk based on the severity of the depressive symptoms  Follow-up Information    [facility] Follow up.   Contact information: [address]  [place], [place] [address]  Phone: [phone]           Plan Of Care/Follow-up recommendations:  Activity:  ad lib Tests:  Patient needs to have a lithium level, a metabolic panel and a thyroid function test 7-14 days after discharge  Sharma Covert, MD 05/04/2020, 9:11 AM

## 2020-05-04 NOTE — Progress Notes (Signed)
Pt denies SI and HI. Pt was educated on dc plan and verbalizes understanding. Pt received dc packet, prescriptions and belongings. Pt was educated on dc plan and verbalizes understanding. Collier Bullock RN

## 2020-05-04 NOTE — Progress Notes (Signed)
Recreation Therapy Notes   Date: 05/04/2020  Time: 9:30 am  Location: Craft room   Behavioral response: Appropriate   Intervention Topic: Happiness     Discussion/Intervention:  Group content today was focused on Happiness. The group defined happiness and described where happiness comes from. Individuals identified what makes them happy and how they go about making others happy. Patients expressed things that stop them from being happy and ways they can improve their happiness. The group stated reasons why it is important to be happy. The group participated in the intervention "My Happiness", where they had a chance to identify and express things that make them happy. Clinical Observations/Feedback:  Patient came to group and stated that smoking cigarettes makes him happy. He explained that happiness comes from every where; people and things. Individual left group early due to unknown reasons and did not return.  [name] LRT/CTRS         Johnny [name] 05/04/2020 12:05 PM

## 2020-05-04 NOTE — Plan of Care (Addendum)
Pt rates depression 9/10 and hopelessness, 8/10. Pt denies SI, Hi and AVH. Pt was educated on care plan and verbalizes understanding. Pt was encouraged to attend groups. Collier Bullock RN Problem: Education: Goal: Knowledge of  General Education information/materials will improve Outcome: Progressing Goal: Emotional status will improve Outcome: Progressing Goal: Mental status will improve Outcome: Progressing Goal: Verbalization of understanding the information provided will improve Outcome: Progressing   Problem: Activity: Goal: Interest or engagement in activities will improve Outcome: Progressing Goal: Sleeping patterns will improve Outcome: Progressing   Problem: Coping: Goal: Ability to verbalize frustrations and anger appropriately will improve Outcome: Progressing Goal: Ability to demonstrate self-control will improve Outcome: Progressing   Problem: Health Behavior/Discharge Planning: Goal: Identification of resources available to assist in meeting health care needs will improve Outcome: Progressing Goal: Compliance with treatment plan for underlying cause of condition will improve Outcome: Progressing   Problem: Physical Regulation: Goal: Ability to maintain clinical measurements within normal limits will improve Outcome: Progressing   Problem: Safety: Goal: Periods of time without injury will increase Outcome: Progressing   Problem: Education: Goal: Utilization of techniques to improve thought processes will improve Outcome: Progressing Goal: Knowledge of the prescribed therapeutic regimen will improve Outcome: Progressing   Problem: Activity: Goal: Interest or engagement in leisure activities will improve Outcome: Progressing Goal: Imbalance in normal sleep/wake cycle will improve Outcome: Progressing   Problem: Coping: Goal: Coping ability will improve Outcome: Progressing Goal: Will verbalize feelings Outcome: Progressing   Problem: Health  Behavior/Discharge Planning: Goal: Ability to make decisions will improve Outcome: Progressing Goal: Compliance with therapeutic regimen will improve Outcome: Progressing   Problem: Role Relationship: Goal: Will demonstrate positive changes in social behaviors and relationships Outcome: Progressing   Problem: Safety: Goal: Ability to disclose and discuss suicidal ideas will improve Outcome: Progressing Goal: Ability to identify and utilize support systems that promote safety will improve Outcome: Progressing   Problem: Self-Concept: Goal: Will verbalize positive feelings about self Outcome: Progressing Goal: Level of anxiety will decrease Outcome: Progressing   Problem: Activity: Goal: Will verbalize the importance of balancing activity with adequate rest periods Outcome: Progressing   Problem: Education: Goal: Will be free of psychotic symptoms Outcome: Progressing Goal: Knowledge of the prescribed therapeutic regimen will improve Outcome: Progressing   Problem: Coping: Goal: Coping ability will improve Outcome: Progressing Goal: Will verbalize feelings Outcome: Progressing   Problem: Health Behavior/Discharge Planning: Goal: Compliance with prescribed medication regimen will improve Outcome: Progressing   Problem: Nutritional: Goal: Ability to achieve adequate nutritional intake will improve Outcome: Progressing   Problem: Role Relationship: Goal: Ability to communicate needs accurately will improve Outcome: Progressing Goal: Ability to interact with others will improve Outcome: Progressing   Problem: Safety: Goal: Ability to redirect hostility and anger into socially appropriate behaviors will improve Outcome: Progressing Goal: Ability to remain free from injury will improve Outcome: Progressing   Problem: Self-Care: Goal: Ability to participate in self-care as condition permits will improve Outcome: Progressing   Problem: Self-Concept: Goal: Will  verbalize positive feelings about self Outcome: Progressing

## 2020-05-06 ENCOUNTER — Inpatient Hospital Stay: Payer: Medicaid Other | Admitting: Family Medicine

## 2020-05-11 ENCOUNTER — Other Ambulatory Visit: Payer: Self-pay | Admitting: Family Medicine

## 2020-05-11 NOTE — Telephone Encounter (Signed)
Requested medication (s) are due for refill today - unknown if continuation of medication  Requested medication (s) are on the active medication list- yes  Future visit scheduled -no  Last refill: 05/04/20 for #30  Notes to clinic: Patient is requesting RF of non delegated Rx- sent for review   Requested Prescriptions  Pending Prescriptions Disp Refills   tiZANidine (ZANAFLEX) 4 MG capsule [Pharmacy Med Name: TIZANIDINE HCL 4 MG CAPSULE] 180 capsule 1    Sig: Take 2 capsules (8 mg total) by mouth 3 (three) times daily as needed for muscle spasms.      Not Delegated - Cardiovascular:  Alpha-2 Agonists - tizanidine Failed - 05/11/2020  2:15 PM      Failed - This refill cannot be delegated      Passed - Valid encounter within last 6 months    Recent Outpatient Visits           4 weeks ago Lumbar radicular pain   [place], [place], DO   2 months ago Tobacco abuse   [place], [place], DO   2 months ago [place], [place]   5 months ago Appointment canceled by hospital   [facility] [place], Megan P, DO   6 months ago Chronic pain syndrome   [facility] [place], Megan P, DO                  Requested Prescriptions  Pending Prescriptions Disp Refills   tiZANidine (ZANAFLEX) 4 MG capsule [Pharmacy Med Name: TIZANIDINE HCL 4 MG CAPSULE] 180 capsule 1    Sig: Take 2 capsules (8 mg total) by mouth 3 (three) times daily as needed for muscle spasms.      Not Delegated - Cardiovascular:  Alpha-2 Agonists - tizanidine Failed - 05/11/2020  2:15 PM      Failed - This refill cannot be delegated      Passed - Valid encounter within last 6 months    Recent Outpatient Visits           4 weeks ago Lumbar radicular pain   [place], [place], DO   2 months ago Tobacco abuse   [place], [place], DO   2 months ago  [place], [place], [place]   5 months ago Appointment canceled by hospital   [facility], Megan P, DO   6 months ago Chronic pain syndrome   [place], [place], DO

## 2020-05-11 NOTE — Telephone Encounter (Signed)
LOV: 04/12/2020.  Last filled 05/04/2020 for 30 tablets with 0 refills.

## 2020-05-13 ENCOUNTER — Encounter: Payer: Medicaid Other | Admitting: General Surgery

## 2020-05-18 ENCOUNTER — Other Ambulatory Visit (HOSPITAL_COMMUNITY): Payer: Self-pay | Admitting: Psychiatry

## 2020-05-19 ENCOUNTER — Telehealth: Payer: Self-pay | Admitting: General Surgery

## 2020-05-19 NOTE — Telephone Encounter (Signed)
Incoming call from patient.  He confirmed his post op visit with Dr. [name] then further states that he needs something for pain.  States that pain is daily in his groin area and around his belt line.  Please call.  Thank you.

## 2020-05-19 NOTE — Telephone Encounter (Signed)
Per Dr.Cannon states she will not send over any Narcotics for pain due to the pain has a history of abusing narcotics. Patient may alternate taking Tylenol and Ibuprofen. Patient was instructed to take Ibuprofen up to 800 mg every three hours and then take Tylenol up to 650 mg every three hours regardless if he is experiencing pain to help with pain and discomfort. Patient verbalizes he has been taking Tylenol and it is not helping. Patient verbalizes understanding of the pain regimen and has no further questions.

## 2020-05-21 ENCOUNTER — Emergency Department
Admission: EM | Admit: 2020-05-21 | Discharge: 2020-05-22 | Disposition: A | Discharge status: Home / Self Care | Payer: Medicaid Other | Attending: Student in an Organized Health Care Education/Training Program | Admitting: Student in an Organized Health Care Education/Training Program

## 2020-05-21 ENCOUNTER — Other Ambulatory Visit: Payer: Self-pay

## 2020-05-21 DIAGNOSIS — Z91040 Latex allergy status: Secondary | ICD-10-CM | POA: Diagnosis not present

## 2020-05-21 DIAGNOSIS — F17210 Nicotine dependence, cigarettes, uncomplicated: Secondary | ICD-10-CM | POA: Insufficient documentation

## 2020-05-21 DIAGNOSIS — Z590 Homelessness: Secondary | ICD-10-CM | POA: Diagnosis not present

## 2020-05-21 DIAGNOSIS — Z79899 Other long term (current) drug therapy: Secondary | ICD-10-CM | POA: Diagnosis not present

## 2020-05-21 DIAGNOSIS — F329 Major depressive disorder, single episode, unspecified: Secondary | ICD-10-CM | POA: Diagnosis not present

## 2020-05-21 DIAGNOSIS — Z96651 Presence of right artificial knee joint: Secondary | ICD-10-CM | POA: Diagnosis not present

## 2020-05-21 DIAGNOSIS — Z20822 Contact with and (suspected) exposure to covid-19: Secondary | ICD-10-CM | POA: Diagnosis not present

## 2020-05-21 DIAGNOSIS — R45851 Suicidal ideations: Secondary | ICD-10-CM | POA: Diagnosis not present

## 2020-05-21 LAB — CBC
HCT: 44.8 % (ref 39.0–52.0)
Hemoglobin: 15.9 g/dL (ref 13.0–17.0)
MCH: 31.1 pg (ref 26.0–34.0)
MCHC: 35.5 g/dL (ref 30.0–36.0)
MCV: 87.5 fL (ref 80.0–100.0)
Platelets: 241 10*3/uL (ref 150–400)
RBC: 5.12 MIL/uL (ref 4.22–5.81)
RDW: 12.6 % (ref 11.5–15.5)
WBC: 10.5 10*3/uL (ref 4.0–10.5)
nRBC: 0 % (ref 0.0–0.2)

## 2020-05-21 LAB — COMPREHENSIVE METABOLIC PANEL
ALT: 31 U/L (ref 0–44)
AST: 33 U/L (ref 15–41)
Albumin: 4.1 g/dL (ref 3.5–5.0)
Alkaline Phosphatase: 132 U/L — ABNORMAL HIGH (ref 38–126)
Anion gap: 11 (ref 5–15)
BUN: 9 mg/dL (ref 6–20)
CO2: 21 mmol/L — ABNORMAL LOW (ref 22–32)
Calcium: 8.7 mg/dL — ABNORMAL LOW (ref 8.9–10.3)
Chloride: 105 mmol/L (ref 98–111)
Creatinine, Ser: 1 mg/dL (ref 0.61–1.24)
GFR calc Af Amer: >60 mL/min (ref >60)
GFR calc non Af Amer: >60 mL/min (ref >60)
Glucose, Bld: 92 mg/dL (ref 70–99)
Potassium: 3.9 mmol/L (ref 3.5–5.1)
Sodium: 137 mmol/L (ref 135–145)
Total Bilirubin: 0.7 mg/dL (ref 0.3–1.2)
Total Protein: 7.8 g/dL (ref 6.5–8.1)

## 2020-05-21 LAB — ACETAMINOPHEN LEVEL: Acetaminophen (Tylenol), Serum: <10 ug/mL — ABNORMAL LOW (ref 10–30)

## 2020-05-21 LAB — ETHANOL: Alcohol, Ethyl (B): 176 mg/dL — ABNORMAL HIGH (ref <10)

## 2020-05-21 LAB — SALICYLATE LEVEL: Salicylate Lvl: <7 mg/dL — ABNORMAL LOW (ref 7.0–30.0)

## 2020-05-21 LAB — SARS CORONAVIRUS 2 BY RT PCR (HOSPITAL ORDER, PERFORMED IN ~~LOC~~ HOSPITAL LAB): SARS Coronavirus 2: NEGATIVE

## 2020-05-21 NOTE — ED Triage Notes (Signed)
Pt states feels suicidal. Pt states has been feeling that way "all day". Pt arrives with [employer] with IVC papers. Pt cooperative.

## 2020-05-21 NOTE — ED Notes (Signed)
IVC prior arrival/Consult ordered/ Pt taken straight to [facility]

## 2020-05-21 NOTE — ED Notes (Signed)
Pt dressed out by Jenny Reichmann, ed tech. Pt to [place] withbpd officer.

## 2020-05-21 NOTE — ED Provider Notes (Signed)
[facility] Emergency Department Provider Note    First MD Initiated Contact with Patient 05/21/20 2240     (approximate)  I have reviewed the triage vital signs and the nursing notes.   HISTORY  Chief Complaint Suicidal    HPI Alexa Pandya. is a 49 y.o. male   presents to the ER for evaluation of suicidal ideation.  Patient is feeling depressed.  Does admit to drinking alcohol today.  States he is homeless does have a history of depression and was previously on lithium not taking that medication anymore since recent visit here.  States his plan would be to overdose on cocaine and alcohol or drive a vehicle and no telephone pole.  Denies any pain or discomfort.  States he goes through cyclical episodes of depression for the past several years.  As it is made worse by his homelessness.   Past Medical History:  Diagnosis Date  . Arthritis    hands, knees, lower back  . Bilateral sciatica   . Carpal tunnel syndrome   . Chronic back pain   . Chronic neck pain   . Chronic pain 03/12/2020   has been connected to pain management clinics. currently waiting to see psychiatrist before he can join [facility]  . Chronic pain syndrome   . GERD (gastroesophageal reflux disease)   . Hand joint pain 07/02/2013  . Herniated disc   . Lives in homeless shelter 02/2020   housing ends as of 03/25/20. going to hotel after surgery  . Traumatic amputation of left index finger 2013  . Wears dentures    full upper and lower   Family History  Problem Relation Age of Onset  . Cancer Father        sarcoma  . Cirrhosis Paternal Grandmother   . Cancer Paternal Grandfather        Pancreatic  . Fibromyalgia Sister   . Deafness Sister    Past Surgical History:  Procedure Laterality Date  . AMPUTATION FINGER / THUMB Left 2013  . COLONOSCOPY WITH PROPOFOL N/A 01/03/2018   Procedure: COLONOSCOPY WITH PROPOFOL;  Surgeon: [name], MD;  Location: [place];  Service: Endoscopy;  Laterality: N/A;  . ESOPHAGOGASTRODUODENOSCOPY (EGD) WITH PROPOFOL N/A 01/03/2018   Procedure: ESOPHAGOGASTRODUODENOSCOPY (EGD) WITH PROPOFOL;  Surgeon: [name], MD;  Location: [place];  Service: Endoscopy;  Laterality: N/A;  . HERNIA REPAIR Right    right inguinal hernia repaired twice  . HIP SURGERY  2006   took bone out of hip to put into neck  . INGUINAL HERNIA REPAIR Left 04/23/2020   Procedure: HERNIA REPAIR INGUINAL ADULT;  Surgeon: [name], MD;  Location: [facility];  Service: General;  Laterality: Left;  . JOINT REPLACEMENT Right    knee  . KNEE SURGERY Right   . LUMBAR LAMINECTOMY/DECOMPRESSION MICRODISCECTOMY N/A 11/18/2019   Procedure: LUMBAR FOUR-FIVE LUMBAR LAMINECTOMY/DECOMPRESSION MICRODISCECTOMY;  Surgeon: [name], MD;  Location: [place];  Service: Neurosurgery;  Laterality: N/A;  . LUMBAR WOUND DEBRIDEMENT N/A 12/06/2019   Procedure: LUMBAR WOUND REVISION;  Surgeon: [name], MD;  Location: [place];  Service: Neurosurgery;  Laterality: N/A;  . neck fusion    . POLYPECTOMY N/A 01/03/2018   Procedure: POLYPECTOMY;  Surgeon: [name], MD;  Location: [place];  Service: Endoscopy;  Laterality: N/A;  . REPAIR OF CEREBROSPINAL FLUID LEAK N/A 11/28/2019   Procedure: REPAIR OF CEREBROSPINAL FLUID LEAK/LUMBAR;  Surgeon: [name], MD;  Location: [place] [facility];  Service: Neurosurgery;  Laterality: N/A;  REPAIR OF CEREBROSPINAL FLUID LEAK/LUMBAR  . REPLACEMENT TOTAL KNEE Right   . SHOULDER SURGERY Right 2016 X 2  . SPINAL FUSION    . TONSILLECTOMY    . UMBILICAL HERNIA REPAIR N/A 11/07/2019   Procedure: HERNIA REPAIR UMBILICAL ADULT;  Surgeon: [name], MD;  Location: [facility];  Service: General;  Laterality: N/A;  . UMBILICAL HERNIA REPAIR N/A 04/23/2020   Procedure: HERNIA REPAIR UMBILICAL ADULT;  Surgeon: [name], MD;  Location: [facility];  Service: General;  Laterality: N/A;  . WOUND  EXPLORATION N/A 12/15/2019   Procedure: LUMBAR WOUND EXPLORATION;  Surgeon: [name], MD;  Location: [place];  Service: Neurosurgery;  Laterality: N/A;  LUMBAR WOUND EXPLORATION   Patient Active Problem List   Diagnosis Date Noted  . Somatic symptom disorder 04/30/2020  . H/O hernia repair 04/30/2020  . Cocaine abuse ([place]) 04/30/2020  . Bipolar affective disorder, depressed, severe, with psychotic behavior ([place]) 04/27/2020  . Abdominal wall cellulitis 04/26/2020  . Cellulitis, abdominal wall 04/26/2020  . Recurrent ventral hernia   . Direct left inguinal hernia   . Wound dehiscence 12/19/2019  . Surgical wound infection 12/06/2019  . CSF leak 11/28/2019  . Postoperative CSF leak 11/28/2019  . HNP (herniated nucleus pulposus), lumbar 11/18/2019  . Umbilical hernia without obstruction and without gangrene 10/23/2019  . Osteoarthritis of spine with radiculopathy, lumbar region 10/09/2019  . Chronic bilateral low back pain with bilateral sciatica 10/07/2019  . S/P cervical spinal fusion (C5-C6 ACDF) 07/15/2019  . Cervical radicular pain 07/15/2019  . Cervical facet joint syndrome 07/15/2019  . Degeneration of lumbar intervertebral disc 07/03/2019  . Lumbar radicular pain 07/03/2019  . Cervical spondylosis 04/17/2019  . Homeless single person 04/17/2019  . History of total knee replacement, right 04/25/2018  . Pancreatic mass 01/14/2018  . Tobacco abuse 01/14/2018  . Chronic neck pain   . Benign neoplasm of descending colon   . Benign neoplasm of ascending colon   . Intractable vomiting with nausea   . Reflux esophagitis   . Duodenal ulcer without hemorrhage or perforation   . Chronic pain syndrome 08/07/2017  . Vitamin D deficiency 06/20/2017  . Insomnia 06/19/2017  . Right sided sciatica   . Presence of artificial knee joint 01/17/2013  . Knee pain 01/17/2013  . 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 Chronic back pain  09/09/2012      Prior to Admission medications   Medication Sig Start Date End Date Taking? Authorizing Provider  gabapentin (NEURONTIN) 400 MG capsule Take 3 capsules (1,200 mg total) by mouth 3 (three) times daily. 05/04/20   Sharma Covert, MD  hydrOXYzine (ATARAX/VISTARIL) 25 MG tablet Take 1 tablet (25 mg total) by mouth 3 (three) times daily as needed (anxiety, sleep). 05/04/20 06/03/20  Sharma Covert, MD  hydrOXYzine (ATARAX/VISTARIL) 50 MG tablet Take 1 tablet (50 mg total) by mouth at bedtime as needed (sleep). 05/04/20 06/03/20  Sharma Covert, MD  lithium carbonate (ESKALITH) 450 MG CR tablet Take 1 tablet (450 mg total) by mouth daily. 05/05/20 06/04/20  Sharma Covert, MD  lithium carbonate (LITHOBID) 300 MG CR tablet Take 2 tablets (600 mg total) by mouth every evening. 05/04/20   Sharma Covert, MD  OLANZapine (ZYPREXA) 10 MG tablet Take 1 tablet (10 mg total) by mouth at bedtime. 05/04/20   Sharma Covert, MD  OLANZapine (ZYPREXA) 5 MG  tablet Take 1 tablet (5 mg total) by mouth every morning. 05/05/20   Sharma Covert, MD  tiZANidine (ZANAFLEX) 4 MG tablet Take 1 tablet (4 mg total) by mouth every 6 (six) hours as needed for muscle spasms. 05/04/20   Sharma Covert, MD    Allergies Otho Darner allergy], Flexeril [cyclobenzaprine], Codeine, Latex, Penicillins, Robaxin [methocarbamol], Sulfamethoxazole-trimethoprim, and Tramadol    Social History Social History   Tobacco Use  . Smoking status: Current Every Day Smoker    Packs/day: 0.50    Years: 25.00    Pack years: 12.50    Types: Cigarettes  . Smokeless tobacco: Never Used  . Tobacco comment: will start Chantix soon.  Substance Use Topics  . Alcohol use: No  . Drug use: No    Review of Systems Patient denies headaches, rhinorrhea, blurry vision, numbness, shortness of breath, chest pain, edema, cough, abdominal pain, nausea, vomiting, diarrhea, dysuria, fevers, rashes or hallucinations  unless otherwise stated above in HPI. ____________________________________________   PHYSICAL EXAM:  VITAL SIGNS: Vitals:   05/21/20 2208  BP: (!) 157/108  Pulse: 83  Resp: 16  Temp: 98.4 F (36.9 C)  SpO2: 97%    Constitutional: Alert and oriented.  Eyes: Conjunctivae are normal.  Head: Atraumatic. Nose: No congestion/rhinnorhea. Mouth/Throat: Mucous membranes are moist.   Neck: No stridor. Painless ROM.  Cardiovascular: Normal rate, regular rhythm. Grossly normal heart sounds.  Good peripheral circulation. Respiratory: Normal respiratory effort.  No retractions. Lungs CTAB. Gastrointestinal: Soft and nontender. No distention. No abdominal bruits. No CVA tenderness. Genitourinary:  Musculoskeletal: No lower extremity tenderness nor edema.  No joint effusions. Neurologic:  Normal speech and language. No gross focal neurologic deficits are appreciated. No facial droop Skin:  Skin is warm, dry and intact. No rash noted. Psychiatric: Mood and affect are normal. Speech and behavior are normal.  ____________________________________________   LABS (all labs ordered are listed, but only abnormal results are displayed)  Results for orders placed or performed during the hospital encounter of 05/21/20 (from the past 24 hour(s))  Comprehensive metabolic panel     Status: Abnormal   Collection Time: 05/21/20 10:16 PM  Result Value Ref Range   Sodium 137 135 - 145 mmol/L   Potassium 3.9 3.5 - 5.1 mmol/L   Chloride 105 98 - 111 mmol/L   CO2 21 (L) 22 - 32 mmol/L   Glucose, Bld 92 70 - 99 mg/dL   BUN 9 6 - 20 mg/dL   Creatinine, Ser 1.00 0.61 - 1.24 mg/dL   Calcium 8.7 (L) 8.9 - 10.3 mg/dL   Total Protein 7.8 6.5 - 8.1 g/dL   Albumin 4.1 3.5 - 5.0 g/dL   AST 33 15 - 41 U/L   ALT 31 0 - 44 U/L   Alkaline Phosphatase 132 (H) 38 - 126 U/L   Total Bilirubin 0.7 0.3 - 1.2 mg/dL   GFR calc non Af Amer >60 >60 mL/min   GFR calc Af Amer >60 >60 mL/min   Anion gap 11 5 - 15  Ethanol      Status: Abnormal   Collection Time: 05/21/20 10:16 PM  Result Value Ref Range   Alcohol, Ethyl (B) 176 (H) <10 mg/dL  cbc     Status: None   Collection Time: 05/21/20 10:16 PM  Result Value Ref Range   WBC 10.5 4.0 - 10.5 K/uL   RBC 5.12 4.22 - 5.81 MIL/uL   Hemoglobin 15.9 13.0 - 17.0 g/dL   HCT 44.8 39.0 -  52.0 %   MCV 87.5 80.0 - 100.0 fL   MCH 31.1 26.0 - 34.0 pg   MCHC 35.5 30.0 - 36.0 g/dL   RDW 12.6 11.5 - 15.5 %   Platelets 241 150 - 400 K/uL   nRBC 0.0 0.0 - 0.2 %   ____________________________________________  ____________________________________________  RADIOLOGY   ____________________________________________   PROCEDURES  Procedure(s) performed:  Procedures    Critical Care performed: no ____________________________________________   INITIAL IMPRESSION / ASSESSMENT AND PLAN / ED COURSE  Pertinent labs & imaging results that were available during my care of the patient were reviewed by me and considered in my medical decision making (see chart for details).   DDX: Psychosis, delirium, medication effect, noncompliance, polysubstance abuse, Si, Hi, depression   Sean Lupton. is a 49 y.o. who presents to the ED with for evaluation of depression and SI.  Patient has psych history of dpression and substance abuse.  Laboratory testing was ordered to evaluation for underlying electrolyte derangement or signs of underlying organic pathology to explain today's presentation.  Based on history and physical and laboratory evaluation, it appears that the patient's presentation is 2/2 underlying psychiatric disorder and will require further evaluation and management by inpatient psychiatry.  Patient was  made an IVC due to SI with plan.  Disposition pending psychiatric evaluation.  The patient has been placed in psychiatric observation due to the need to provide a safe environment for the patient while obtaining psychiatric consultation and evaluation, as  well as ongoing medical and medication management to treat the patient's condition.  The patient has been placed under full IVC at this time.      The patient was evaluated in Emergency Department today for the symptoms described in the history of present illness. He/she was evaluated in the context of the global COVID-19 pandemic, which necessitated consideration that the patient might be at risk for infection with the SARS-CoV-2 virus that causes COVID-19. Institutional protocols and algorithms that pertain to the evaluation of patients at risk for COVID-19 are in a state of rapid change based on information released by regulatory bodies including the CDC and federal and state organizations. These policies and algorithms were followed during the patient's care in the ED.  As part of my medical decision making, I reviewed the following data within the [place] notes reviewed and incorporated, Labs reviewed, notes from prior ED visits and [place] Controlled Substance Database   ____________________________________________   FINAL CLINICAL IMPRESSION(S) / ED DIAGNOSES  Final diagnoses:  Suicidal ideation      NEW MEDICATIONS STARTED DURING THIS VISIT:  New Prescriptions   No medications on file     Note:  This document was prepared using Dragon voice recognition software and may include unintentional dictation errors.    Merlyn Lot, MD 05/21/20 2255

## 2020-05-21 NOTE — ED Notes (Signed)
Pt. Transferred from Triage to room 5 after dressing out and screening for contraband. Report to include Situation, Background, Assessment and Recommendations from [place]. Pt. Oriented to [place] including Q15 minute rounds as well as Engineer, drilling for their protection. Patient is alert and oriented, warm and dry in no acute distress. Patient denies HI, and AVH. Patient states he has SI without plan. Pt. Encouraged to let me know if needs arise.

## 2020-05-22 DIAGNOSIS — R45851 Suicidal ideations: Secondary | ICD-10-CM

## 2020-05-22 MED ORDER — ONDANSETRON HCL 4 MG PO TABS
4.0000 mg | ORAL_TABLET | Freq: Once | ORAL | Status: AC
  Administered 2020-05-22: 4 mg via ORAL
  Filled 2020-05-22: qty 1

## 2020-05-22 NOTE — ED Notes (Signed)
Hourly rounding reveals patient awake in room. No complaints, stable, in no acute distress. Q15 minute rounds and monitoring via Security Cameras to continue. 

## 2020-05-22 NOTE — ED Notes (Signed)
EDP made aware of pt's BP 

## 2020-05-22 NOTE — ED Notes (Signed)
Pt. C/o nausea.

## 2020-05-22 NOTE — ED Notes (Signed)
Pt discharged home. All belongings returned to pt.  Pt denies SI/HI.  Pt signed for discharge. Discharge instructions and resources given to pt.

## 2020-05-22 NOTE — ED Notes (Signed)
IVC/Consult completed/ Reassess In Am

## 2020-05-22 NOTE — ED Notes (Signed)
Hourly rounding reveals patient sleeping in room. No complaints, stable, in no acute distress. Q15 minute rounds and monitoring via Security Cameras to continue. 

## 2020-05-22 NOTE — BH Assessment (Signed)
Assessment Note  Johnny [name]. is an 49 y.o. male presenting to [facility] ED under IVC. Per triage note Pt states feels suicidal. Pt states has been feeling that way "all day". Pt arrives with BPD with IVC papers. Pt cooperative. During assessment patient was alert and oriented x4, irritable and angry, patient was not very cooperative during assessment and did not give much detail into his feelings. When asked why patient was presenting to the ED he reported "suicidal thoughts." When asked what was making him feel suicidal patient reports "I don't know." When asked if patient had a plan he reports "yea I'll take a 8 ball and alcohol and drive my vehicle into a tree." Patient reported that he is currently homeless "I've been homeless for too damn long almost a year." Patient reports that he currently sleeps in the back of his [name]. When asked if patient would still feel suicidal if he had a place to live he reported "I don't know." Patient was just recently discharged from [facility] 2 weeks ago and was transferred to [facility] to assist him with his current living situation, patient reported "don't send me back to that shit they have all these rules, they only pay you $10 for work, make you go to church, make you wear pants, the chicken there was awful all the food there is donated, it made me more depressed being there, ya'll shouldn't send people there it's a fucking cult!" When asked if patient has used any substances patient denies. Patient BAL is 176, no current UDS available. Patient denies HI/AH/VH and does not appear to be responding to any internal or external stimuli.  Per Psyc NP patient will be observed overnight and reassessed in the morning  Diagnosis: Bipolar affective disorder by history  Past Medical History:  Past Medical History:  Diagnosis Date  . Arthritis    hands, knees, lower back  . Bilateral sciatica   . Carpal tunnel syndrome   . Chronic back pain   . Chronic  neck pain   . Chronic pain 03/12/2020   has been connected to pain management clinics. currently waiting to see psychiatrist before he can join [facility]  . Chronic pain syndrome   . GERD (gastroesophageal reflux disease)   . Hand joint pain 07/02/2013  . Herniated disc   . Lives in homeless shelter 02/2020   housing ends as of 03/25/20. going to hotel after surgery  . Traumatic amputation of left index finger 2013  . Wears dentures    full upper and lower    Past Surgical History:  Procedure Laterality Date  . AMPUTATION FINGER / THUMB Left 2013  . COLONOSCOPY WITH PROPOFOL N/A 01/03/2018   Procedure: COLONOSCOPY WITH PROPOFOL;  Surgeon: [name], MD;  Location: [place];  Service: Endoscopy;  Laterality: N/A;  . ESOPHAGOGASTRODUODENOSCOPY (EGD) WITH PROPOFOL N/A 01/03/2018   Procedure: ESOPHAGOGASTRODUODENOSCOPY (EGD) WITH PROPOFOL;  Surgeon: [name], MD;  Location: [place];  Service: Endoscopy;  Laterality: N/A;  . HERNIA REPAIR Right    right inguinal hernia repaired twice  . HIP SURGERY  2006   took bone out of hip to put into neck  . INGUINAL HERNIA REPAIR Left 04/23/2020   Procedure: HERNIA REPAIR INGUINAL ADULT;  Surgeon: [name], MD;  Location: [facility];  Service: General;  Laterality: Left;  . JOINT REPLACEMENT Right    knee  . KNEE SURGERY Right   . LUMBAR LAMINECTOMY/DECOMPRESSION MICRODISCECTOMY N/A 11/18/2019  Procedure: LUMBAR FOUR-FIVE LUMBAR LAMINECTOMY/DECOMPRESSION MICRODISCECTOMY;  Surgeon: [name], MD;  Location: [place];  Service: Neurosurgery;  Laterality: N/A;  . LUMBAR WOUND DEBRIDEMENT N/A 12/06/2019   Procedure: LUMBAR WOUND REVISION;  Surgeon: [name], MD;  Location: [place];  Service: Neurosurgery;  Laterality: N/A;  . neck fusion    . POLYPECTOMY N/A 01/03/2018   Procedure: POLYPECTOMY;  Surgeon: [name], MD;  Location: [place];  Service: Endoscopy;  Laterality: N/A;  . REPAIR OF  CEREBROSPINAL FLUID LEAK N/A 11/28/2019   Procedure: REPAIR OF CEREBROSPINAL FLUID LEAK/LUMBAR;  Surgeon: [name], MD;  Location: [place];  Service: Neurosurgery;  Laterality: N/A;  REPAIR OF CEREBROSPINAL FLUID LEAK/LUMBAR  . REPLACEMENT TOTAL KNEE Right   . SHOULDER SURGERY Right 2016 X 2  . SPINAL FUSION    . TONSILLECTOMY    . UMBILICAL HERNIA REPAIR N/A 11/07/2019   Procedure: HERNIA REPAIR UMBILICAL ADULT;  Surgeon: [name], MD;  Location: [facility];  Service: General;  Laterality: N/A;  . UMBILICAL HERNIA REPAIR N/A 04/23/2020   Procedure: HERNIA REPAIR UMBILICAL ADULT;  Surgeon: [name], MD;  Location: [facility];  Service: General;  Laterality: N/A;  . WOUND EXPLORATION N/A 12/15/2019   Procedure: LUMBAR WOUND EXPLORATION;  Surgeon: [name], MD;  Location: [place];  Service: Neurosurgery;  Laterality: N/A;  LUMBAR WOUND EXPLORATION    Family History:  Family History  Problem Relation Age of Onset  . Cancer Father        sarcoma  . Cirrhosis Paternal Grandmother   . Cancer Paternal Grandfather        Pancreatic  . Fibromyalgia Sister   . Deafness Sister     Social History:  reports that he has been smoking cigarettes. He has a 12.50 pack-year smoking history. He has never used smokeless tobacco. He reports that he does not drink alcohol or use drugs.  Additional Social History:  Alcohol / Drug Use Pain Medications: See MAR Prescriptions: See MAR Over the Counter: See MAR History of alcohol / drug use?: Yes Substance #1 Name of Substance 1: Alcohol  CIWA: CIWA-Ar BP: (!) 157/108 Pulse Rate: 83 COWS:    Allergies:  Allergies  Allergen Reactions  . Crab [Shellfish Allergy] Itching and Swelling  . Flexeril [Cyclobenzaprine] Anaphylaxis  . Codeine Itching  . Latex Swelling    Gloves(when worn), condoms  . Penicillins Nausea And Vomiting    Did it involve swelling of the face/tongue/throat, SOB, or low BP? No Did it involve sudden or severe  rash/hives, skin peeling, or any reaction on the inside of your mouth or nose? No Did you need to seek medical attention at a hospital or doctor's office? No When did it last happen?10+years If all above answers are "NO", may proceed with cephalosporin use.    . Robaxin [Methocarbamol] Itching  . Sulfamethoxazole-Trimethoprim Nausea Only and Other (See Comments)    Stomach pain   . Tramadol Other (See Comments)    Gives headaches    Home Medications: (Not in a hospital admission)   OB/GYN Status:  No LMP for male patient.  General Assessment Data Location of Assessment: [facility] ED TTS Assessment: In system Is this a Tele or Face-to-Face Assessment?: Face-to-Face Is this an Initial Assessment or a Re-assessment for this encounter?: Initial Assessment Patient Accompanied by:: N/A Language Other than English: No Living Arrangements: Homeless/Shelter What gender do you identify as?: Male Marital status: Divorced Pregnancy Status: No Living Arrangements: Alone, Other (Comment)(Homeless) Can pt  return to current living arrangement?: Yes Admission Status: Involuntary Petitioner: Police Is patient capable of signing voluntary admission?: No Referral Source: Other Insurance type: Medicaid  Medical Screening Exam ([place]) Medical Exam completed: Yes  Crisis Care Plan Living Arrangements: Alone, Other (Comment)(Homeless) Legal Guardian: Other:(Self) Name of Psychiatrist: None Name of Therapist: None  Education Status Is patient currently in school?: No Highest grade of school patient has completed: 7th grade Is the patient employed, unemployed or receiving disability?: Unemployed  Risk to self with the past 6 months Suicidal Ideation: Yes-Currently Present Has patient been a risk to self within the past 6 months prior to admission? : Yes Suicidal Intent: Yes-Currently Present Has patient had any suicidal intent within the past 6 months prior to admission? :  Yes Is patient at risk for suicide?: Yes Suicidal Plan?: Yes-Currently Present Has patient had any suicidal plan within the past 6 months prior to admission? : Yes Specify Current Suicidal Plan: "Drive into a tree, overdose on alcohol and a 8ball" Access to Means: Yes(Owns a Printmaker) What has been your use of drugs/alcohol within the last 12 months?: Alcohol, Cocaine Previous Attempts/Gestures: Yes How many times?: 1 Other Self Harm Risks: None Triggers for Past Attempts: Other (Comment)(Homlessness) Intentional Self Injurious Behavior: None Family Suicide History: Unknown Recent stressful life event(s): Other (Comment), Financial Problems(Currently homeless) Persecutory voices/beliefs?: No Depression: Yes Depression Symptoms: Loss of interest in usual pleasures, Feeling worthless/self pity, Feeling angry/irritable Substance abuse history and/or treatment for substance abuse?: Yes Suicide prevention information given to non-admitted patients: Not applicable  Risk to Others within the past 6 months Homicidal Ideation: No Does patient have any lifetime risk of violence toward others beyond the six months prior to admission? : No Thoughts of Harm to Others: No Current Homicidal Intent: No Current Homicidal Plan: No Access to Homicidal Means: No Identified Victim: None History of harm to others?: No Assessment of Violence: None Noted Violent Behavior Description: None Does patient have access to weapons?: No Criminal Charges Pending?: No Does patient have a court date: No Is patient on probation?: No  Psychosis Hallucinations: None noted Delusions: None noted  Mental Status Report Appearance/Hygiene: In scrubs Eye Contact: Poor Motor Activity: Freedom of movement Speech: Logical/coherent Level of Consciousness: Alert Mood: Irritable, Angry Affect: Angry, Irritable Anxiety Level: Moderate Thought Processes: Coherent Judgement: Unimpaired Orientation: Person, Place, Time,  Situation, Appropriate for developmental age Obsessive Compulsive Thoughts/Behaviors: None  Cognitive Functioning Concentration: Normal Memory: Recent Intact, Remote Intact Is patient IDD: No Insight: Poor Impulse Control: Fair Appetite: Poor Have you had any weight changes? : No Change Sleep: Decreased Total Hours of Sleep: 0 Vegetative Symptoms: None  ADLScreening [facility] Assessment Services) Patient's cognitive ability adequate to safely complete daily activities?: Yes Patient able to express need for assistance with ADLs?: Yes Independently performs ADLs?: Yes (appropriate for developmental age)  Prior Inpatient Therapy Prior Inpatient Therapy: Yes Prior Therapy Dates: 04/2020 Prior Therapy Facilty/Provider(s): [facility] Reason for Treatment: SI  Prior Outpatient Therapy Prior Outpatient Therapy: No Does patient have an ACCT team?: No Does patient have Intensive In-House Services?  : No Does patient have [facility] services? : No Does patient have P4CC services?: No  ADL Screening (condition at time of admission) Patient's cognitive ability adequate to safely complete daily activities?: Yes Is the patient deaf or have difficulty hearing?: No Does the patient have difficulty seeing, even when wearing glasses/contacts?: No Does the patient have difficulty concentrating, remembering, or making decisions?: No Patient able to express need  for assistance with ADLs?: Yes Does the patient have difficulty dressing or bathing?: No Independently performs ADLs?: Yes (appropriate for developmental age) Does the patient have difficulty walking or climbing stairs?: No Weakness of Legs: None Weakness of Arms/Hands: None  Home Assistive Devices/Equipment Home Assistive Devices/Equipment: None  Therapy Consults (therapy consults require a physician order) PT Evaluation Needed: No OT Evalulation Needed: No SLP Evaluation Needed: No Abuse/Neglect Assessment (Assessment to be complete  while patient is alone) Physical Abuse: Denies Verbal Abuse: Denies Sexual Abuse: Denies Exploitation of patient/patient's resources: Denies Self-Neglect: Denies Values / Beliefs Cultural Requests During Hospitalization: None Spiritual Requests During Hospitalization: None Consults Spiritual Care Consult Needed: No Transition of Care Team Consult Needed: No            Disposition: Per Psyc NP patient will be observed overnight and reassessed in the morning Disposition Initial Assessment Completed for this Encounter: Yes  On Site Evaluation by:   Reviewed with Physician:    Leonie Douglas MS [place] 05/22/2020 1:58 AM

## 2020-05-22 NOTE — Consult Note (Signed)
[place] Psychiatry Consult   Reason for Consult:  Psych evaluation  Referring Physician:  Dr. Alfred Levins Patient Identification: Johnny [name]. MRN:  [id number] Principal Diagnosis: <principal problem not specified> Diagnosis:  Active Problems:   * No active hospital problems. *   Total Time spent with patient: 45 minutes  Subjective:   Johnny Bolder Masaichi Kracht. is a 49 y.o. male patient admitted with suicidal ideations. Per edp: Johnny Bolder Elo Marmolejos. is a 49 y.o. male  presents to the ER for evaluation of suicidal ideation.  Patient is feeling depressed.  Does admit to drinking alcohol today.  States he is homeless does have a history of depression and was previously on lithium not taking that medication anymore since recent visit here.  States his plan would be to overdose on cocaine and alcohol or drive a vehicle and no telephone pole.  Denies any pain or discomfort.  States he goes through cyclical episodes of depression for the past several years.  As it is made worse by his homelessness.  HPI:   Johnny Filippini., 49 y.o., male patient presented to [facility] under IVC brought in by BPD.  Patient seen face to face  by TTS and this provider; chart reviewed and consulted with Dr. Dwyane Dee on 05/22/20.  On evaluation Johnny [name]. Per triage note Pt states feels suicidal.  Pt  states has been feeling that way "all day". Pt arrives with BPD with IVC papers. Pt cooperative.During assessment patient was alert and oriented x4, irritable and angry, patient was not very cooperative during assessment and did not give much detail into his feelings. When asked why patient was presenting to the ED he reported "suicidal thoughts." When asked what was making him feel suicidal patient reports "I don't know." When asked if patient had a plan he reports "yea I'll take a 8 ball and alcohol and drive my vehicle into a tree." Patient reported that he is currently homeless "I've been homeless for  too damn long almost a year." Patient reports that he currently sleeps in the back of his [name]. When asked if patient would still feel suicidal if he had a place to live he reported "I don't know." Patient was just recently discharged from [facility] 2 weeks ago and was transferred to [facility] to assist him with his current living situation, patient reported "don't send me back to that shit they have all these rules, they only pay you $10 for work, make you go to church, make you wear pants, the chicken there was awful all the food there is donated, it made me more depressed being there, ya'll shouldn't send people there it's a fucking cult!" When asked if patient has used any substances patient denies. Patient BAL is 176, no current UDS available. Patient denies HI/AH/VH and does not appear to be responding to any internal or external stimuli  During evaluation Johnny [name]. is laying in bed; he is alert/oriented x 4;  Irritable/combative ; and mood congruent with affect.  Patient is speaking in a clear tone at moderate volume, and normal pace; with minimal  eye contact.  His thought process is coherent and relevant; There is no indication that he is currently responding to internal/external stimuli or experiencing delusional thought content.  Patient endorses suicidal/self-harm  Ideation and denies homicidal ideation, psychosis, and paranoia.  Patient has remained irritable throughout assessment.    Recommendations: Reassess in the am to ensure psychiatric state of being.  Suspected malingering.  Past Psychiatric History: depression   Risk to Self: Suicidal Ideation: Yes-Currently Present Suicidal Intent: Yes-Currently Present Is patient at risk for suicide?: Yes Suicidal Plan?: Yes-Currently Present Specify Current Suicidal Plan: "Drive into a tree, overdose on alcohol and a 8ball" Access to Means: Yes(Owns a van) What has been your use of drugs/alcohol within the last 12 months?:  Alcohol, Cocaine How many times?: 1 Other Self Harm Risks: None Triggers for Past Attempts: Other (Comment)(Homlessness) Intentional Self Injurious Behavior: None Risk to Others: Homicidal Ideation: No Thoughts of Harm to Others: No Current Homicidal Intent: No Current Homicidal Plan: No Access to Homicidal Means: No Identified Victim: None History of harm to others?: No Assessment of Violence: None Noted Violent Behavior Description: None Does patient have access to weapons?: No Criminal Charges Pending?: No Does patient have a court date: No Prior Inpatient Therapy: Prior Inpatient Therapy: Yes Prior Therapy Dates: 04/2020 Prior Therapy Facilty/Provider(s): [facility] Reason for Treatment: SI Prior Outpatient Therapy: Prior Outpatient Therapy: No Does patient have an ACCT team?: No Does patient have Intensive In-House Services?  : No Does patient have [facility] services? : No Does patient have P4CC services?: No  Past Medical History:  Past Medical History:  Diagnosis Date  . Arthritis    hands, knees, lower back  . Bilateral sciatica   . Carpal tunnel syndrome   . Chronic back pain   . Chronic neck pain   . Chronic pain 03/12/2020   has been connected to pain management clinics. currently waiting to see psychiatrist before he can join [facility]  . Chronic pain syndrome   . GERD (gastroesophageal reflux disease)   . Hand joint pain 07/02/2013  . Herniated disc   . Lives in homeless shelter 02/2020   housing ends as of 03/25/20. going to hotel after surgery  . Traumatic amputation of left index finger 2013  . Wears dentures    full upper and lower    Past Surgical History:  Procedure Laterality Date  . AMPUTATION FINGER / THUMB Left 2013  . COLONOSCOPY WITH PROPOFOL N/A 01/03/2018   Procedure: COLONOSCOPY WITH PROPOFOL;  Surgeon: [name], MD;  Location: [place];  Service: Endoscopy;  Laterality: N/A;  . ESOPHAGOGASTRODUODENOSCOPY (EGD) WITH PROPOFOL  N/A 01/03/2018   Procedure: ESOPHAGOGASTRODUODENOSCOPY (EGD) WITH PROPOFOL;  Surgeon: [name], MD;  Location: [place];  Service: Endoscopy;  Laterality: N/A;  . HERNIA REPAIR Right    right inguinal hernia repaired twice  . HIP SURGERY  2006   took bone out of hip to put into neck  . INGUINAL HERNIA REPAIR Left 04/23/2020   Procedure: HERNIA REPAIR INGUINAL ADULT;  Surgeon: [name], MD;  Location: [facility];  Service: General;  Laterality: Left;  . JOINT REPLACEMENT Right    knee  . KNEE SURGERY Right   . LUMBAR LAMINECTOMY/DECOMPRESSION MICRODISCECTOMY N/A 11/18/2019   Procedure: LUMBAR FOUR-FIVE LUMBAR LAMINECTOMY/DECOMPRESSION MICRODISCECTOMY;  Surgeon: [name], MD;  Location: [place];  Service: Neurosurgery;  Laterality: N/A;  . LUMBAR WOUND DEBRIDEMENT N/A 12/06/2019   Procedure: LUMBAR WOUND REVISION;  Surgeon: [name], MD;  Location: [place];  Service: Neurosurgery;  Laterality: N/A;  . neck fusion    . POLYPECTOMY N/A 01/03/2018   Procedure: POLYPECTOMY;  Surgeon: [name], MD;  Location: [place];  Service: Endoscopy;  Laterality: N/A;  . REPAIR OF CEREBROSPINAL FLUID LEAK N/A 11/28/2019   Procedure: REPAIR OF CEREBROSPINAL FLUID LEAK/LUMBAR;  Surgeon: [name],  MD;  Location: [place];  Service: Neurosurgery;  Laterality: N/A;  REPAIR OF CEREBROSPINAL FLUID LEAK/LUMBAR  . REPLACEMENT TOTAL KNEE Right   . SHOULDER SURGERY Right 2016 X 2  . SPINAL FUSION    . TONSILLECTOMY    . UMBILICAL HERNIA REPAIR N/A 11/07/2019   Procedure: HERNIA REPAIR UMBILICAL ADULT;  Surgeon: [name], MD;  Location: [facility];  Service: General;  Laterality: N/A;  . UMBILICAL HERNIA REPAIR N/A 04/23/2020   Procedure: HERNIA REPAIR UMBILICAL ADULT;  Surgeon: [name], MD;  Location: [facility];  Service: General;  Laterality: N/A;  . WOUND EXPLORATION N/A 12/15/2019   Procedure: LUMBAR WOUND EXPLORATION;  Surgeon: [name], MD;   Location: [place];  Service: Neurosurgery;  Laterality: N/A;  LUMBAR WOUND EXPLORATION   Family History:  Family History  Problem Relation Age of Onset  . Cancer Father        sarcoma  . Cirrhosis Paternal Grandmother   . Cancer Paternal Grandfather        Pancreatic  . Fibromyalgia Sister   . Deafness Sister    Family Psychiatric  History: unknown Social History:  Social History   Substance and Sexual Activity  Alcohol Use No     Social History   Substance and Sexual Activity  Drug Use No    Social History   Socioeconomic History  . Marital status: Divorced    Spouse name: Not on file  . Number of children: Not on file  . Years of education: Not on file  . Highest education level: Not on file  Occupational History  . Occupation: delivery driver  Tobacco Use  . Smoking status: Current Every Day Smoker    Packs/day: 0.50    Years: 25.00    Pack years: 12.50    Types: Cigarettes  . Smokeless tobacco: Never Used  . Tobacco comment: will start Chantix soon.  Substance and Sexual Activity  . Alcohol use: No  . Drug use: No  . Sexual activity: Yes  Other Topics Concern  . Not on file  Social History Narrative   Patient currently lives in homeless shelter with 6 other men. This arrangement ends as of 03/25/2020.  Social Work has been aggressively working with patient regarding housing and financial issues. He will go to a hotel after surgery and has a friend named [name] that will stay with him. Unsure of where he will go after that day.   He has met with MD at [facility] pain clinic but will not receive any pain medicine from them until he sees a psychiatrist. This is scheduled for May.  He is currently out of pain medicine and has frequented the ER for help.   Dr. [name] will be able to order post op medicine since he has not yet signed a contract.   Social Determinants of Health   Financial Resource Strain:   . Difficulty of Paying Living Expenses:   Food Insecurity:    . Worried About Charity fundraiser in the Last Year:   . Arboriculturist in the Last Year:   Transportation Needs:   . Film/video editor (Medical):   Marland Kitchen Lack of Transportation (Non-Medical):   Physical Activity:   . Days of Exercise per Week:   . Minutes of Exercise per Session:   Stress:   . Feeling of Stress :   Social Connections:   . Frequency of Communication with Friends and Family:   . Frequency of Social Gatherings  with Friends and Family:   . Attends Religious Services:   . Active Member of Clubs or Organizations:   . Attends Archivist Meetings:   Marland Kitchen Marital Status:    Additional Social History:    Allergies:   Allergies  Allergen Reactions  . Crab [Shellfish Allergy] Itching and Swelling  . Flexeril [Cyclobenzaprine] Anaphylaxis  . Codeine Itching  . Latex Swelling    Gloves(when worn), condoms  . Penicillins Nausea And Vomiting    Did it involve swelling of the face/tongue/throat, SOB, or low BP? No Did it involve sudden or severe rash/hives, skin peeling, or any reaction on the inside of your mouth or nose? No Did you need to seek medical attention at a hospital or doctor's office? No When did it last happen?10+years If all above answers are "NO", may proceed with cephalosporin use.    . Robaxin [Methocarbamol] Itching  . Sulfamethoxazole-Trimethoprim Nausea Only and Other (See Comments)    Stomach pain   . Tramadol Other (See Comments)    Gives headaches    Labs:  Results for orders placed or performed during the hospital encounter of 05/21/20 (from the past 48 hour(s))  Comprehensive metabolic panel     Status: Abnormal   Collection Time: 05/21/20 10:16 PM  Result Value Ref Range   Sodium 137 135 - 145 mmol/L   Potassium 3.9 3.5 - 5.1 mmol/L   Chloride 105 98 - 111 mmol/L   CO2 21 (L) 22 - 32 mmol/L   Glucose, Bld 92 70 - 99 mg/dL    Comment: Glucose reference range applies only to samples taken after fasting for at least 8  hours.   BUN 9 6 - 20 mg/dL   Creatinine, Ser 1.00 0.61 - 1.24 mg/dL   Calcium 8.7 (L) 8.9 - 10.3 mg/dL   Total Protein 7.8 6.5 - 8.1 g/dL   Albumin 4.1 3.5 - 5.0 g/dL   AST 33 15 - 41 U/L   ALT 31 0 - 44 U/L   Alkaline Phosphatase 132 (H) 38 - 126 U/L   Total Bilirubin 0.7 0.3 - 1.2 mg/dL   GFR calc non Af Amer >60 >60 mL/min   GFR calc Af Amer >60 >60 mL/min   Anion gap 11 5 - 15    Comment: Performed at [facility], [address] [place], [place] [address]  Ethanol     Status: Abnormal   Collection Time: 05/21/20 10:16 PM  Result Value Ref Range   Alcohol, Ethyl (B) 176 (H) <10 mg/dL    Comment: (NOTE) Lowest detectable limit for serum alcohol is 10 mg/dL. For medical purposes only. Performed at [facility], [address] [place], [place] [address]   Salicylate level     Status: Abnormal   Collection Time: 05/21/20 10:16 PM  Result Value Ref Range   Salicylate Lvl <0.7 (L) 7.0 - 30.0 mg/dL    Comment: Performed at [facility], [address] [place], [place] [address]  Acetaminophen level     Status: Abnormal   Collection Time: 05/21/20 10:16 PM  Result Value Ref Range   Acetaminophen (Tylenol), Serum <10 (L) 10 - 30 ug/mL    Comment: (NOTE) Therapeutic concentrations vary significantly. A range of 10-30 ug/mL  may be an effective concentration for many patients. However, some  are best treated at concentrations outside of this range. Acetaminophen concentrations >150 ug/mL at 4 hours after ingestion  and >50 ug/mL at 12 hours after ingestion are  often associated with  toxic reactions. Performed at [facility], [address] [place], [place] [address]   cbc     Status: None   Collection Time: 05/21/20 10:16 PM  Result Value Ref Range   WBC 10.5 4.0 - 10.5 K/uL   RBC 5.12 4.22 - 5.81 MIL/uL   Hemoglobin 15.9 13.0 - 17.0 g/dL   HCT 44.8 39.0 - 52.0 %   MCV 87.5 80.0 - 100.0 fL   MCH 31.1 26.0 - 34.0 pg   MCHC 35.5  30.0 - 36.0 g/dL   RDW 12.6 11.5 - 15.5 %   Platelets 241 150 - 400 K/uL   nRBC 0.0 0.0 - 0.2 %    Comment: Performed at [facility], [address] [place], [place] [address]  SARS Coronavirus 2 by RT PCR (hospital order, performed in [facility] hospital lab) Nasopharyngeal Nasopharyngeal Swab     Status: None   Collection Time: 05/21/20 10:50 PM   Specimen: Nasopharyngeal Swab  Result Value Ref Range   SARS Coronavirus 2 NEGATIVE NEGATIVE    Comment: (NOTE) SARS-CoV-2 target nucleic acids are NOT DETECTED. The SARS-CoV-2 RNA is generally detectable in upper and lower respiratory specimens during the acute phase of infection. The lowest concentration of SARS-CoV-2 viral copies this assay can detect is 250 copies / mL. A negative result does not preclude SARS-CoV-2 infection and should not be used as the sole basis for treatment or other patient management decisions.  A negative result may occur with improper specimen collection / handling, submission of specimen other than nasopharyngeal swab, presence of viral mutation(s) within the areas targeted by this assay, and inadequate number of viral copies (<250 copies / mL). A negative result must be combined with clinical observations, patient history, and epidemiological information. Fact Sheet for Patients:   StrictlyIdeas.no Fact Sheet for Healthcare Providers: BankingDealers.co.za This test is not yet approved or cleared  by the [place] FDA and has been authorized for detection and/or diagnosis of SARS-CoV-2 by FDA under an Emergency Use Authorization (EUA).  This EUA will remain in effect (meaning this test can be used) for the duration of the COVID-19 declaration under Section 564(b)(1) of the Act, 21 U.S.C. section 360bbb-3(b)(1), unless the authorization is terminated or revoked sooner. Performed at [facility], [address] [place], [place]  [address]     No current facility-administered medications for this encounter.   Current Outpatient Medications  Medication Sig Dispense Refill  . gabapentin (NEURONTIN) 400 MG capsule Take 3 capsules (1,200 mg total) by mouth 3 (three) times daily. 90 capsule 0  . hydrOXYzine (ATARAX/VISTARIL) 25 MG tablet Take 1 tablet (25 mg total) by mouth 3 (three) times daily as needed (anxiety, sleep). 90 tablet 0  . hydrOXYzine (ATARAX/VISTARIL) 50 MG tablet Take 1 tablet (50 mg total) by mouth at bedtime as needed (sleep). 30 tablet 0  . lithium carbonate (ESKALITH) 450 MG CR tablet Take 1 tablet (450 mg total) by mouth daily. 30 tablet 0  . lithium carbonate (LITHOBID) 300 MG CR tablet Take 2 tablets (600 mg total) by mouth every evening. 30 tablet 0  . OLANZapine (ZYPREXA) 10 MG tablet Take 1 tablet (10 mg total) by mouth at bedtime. 30 tablet 0  . OLANZapine (ZYPREXA) 5 MG tablet Take 1 tablet (5 mg total) by mouth every morning. 30 tablet 0  . tiZANidine (ZANAFLEX) 4 MG tablet Take 1 tablet (4 mg total) by mouth every 6 (six) hours as needed  for muscle spasms. 30 tablet 0    Musculoskeletal: Strength & Muscle Tone: within normal limits Gait & Station: normal Patient leans: N/A  Psychiatric Specialty Exam: Physical Exam  Nursing note and vitals reviewed. Constitutional: He is oriented to person, place, and time. He appears well-developed.  HENT:  Head: Normocephalic.  Eyes: Pupils are equal, round, and reactive to light.  Respiratory: Effort normal.  Musculoskeletal:        General: Normal range of motion.     Cervical back: Normal range of motion.  Neurological: He is alert and oriented to person, place, and time.  Skin: Skin is warm and dry.  Psychiatric: His speech is normal. His affect is angry. He is agitated. Cognition and memory are normal. He expresses impulsivity. He expresses suicidal ideation.    Review of Systems  Psychiatric/Behavioral: Positive for agitation, dysphoric  mood and suicidal ideas.  All other systems reviewed and are negative.   Blood pressure (!) 157/108, pulse 83, temperature 98.4 F (36.9 C), temperature source Oral, resp. rate 16, height 6' (1.829 m), weight 93 kg, SpO2 97 %.Body mass index is 27.8 kg/m.  General Appearance: Casual  Eye Contact:  Minimal  Speech:  Clear and Coherent  Volume:  Increased  Mood:  Depressed and Irritable  Affect:  Congruent  Thought Process:  Coherent and Descriptions of Associations: Intact  Orientation:  Full (Time, Place, and Person)  Thought Content:  Illogical  Suicidal Thoughts:  Yes.  without intent/plan  Homicidal Thoughts:  No  Memory:  Recent;   Fair  Judgement:  Impaired  Insight:  Lacking  Psychomotor Activity:  Normal  Concentration:  Attention Span: Fair  Recall:  [employer] of Knowledge:  Fair  Language:  Fair  Akathisia:  NA  Handed:  Right  AIMS (if indicated):     Assets:  Resilience  ADL's:  Intact  Cognition:  WNL  Sleep:        Treatment Plan Summary: reassess in the am  Disposition: Supportive therapy provided about ongoing stressors. reassess in the am  Deloria Lair, NP 05/22/2020 2:26 AM

## 2020-05-22 NOTE — ED Notes (Signed)
Pt upset the hospital could not provide him with transportation.

## 2020-05-22 NOTE — ED Notes (Signed)
Hourly rounding reveals patient in room. Stable, in no acute distress. Q15 minute rounds and monitoring via Security Cameras to continue. 

## 2020-05-22 NOTE — ED Notes (Signed)
Pt complaining of swollen ankles and nausea. RN will notify EDP.

## 2020-05-22 NOTE — Consult Note (Signed)
  Johnny [name]. is a 49 y.o. male patient admitted with suicidal ideations. Per edp: Johnny Vangis a 49 y.o.malepresents to the ER for evaluation of suicidal ideation. Patient is feeling depressed. Does admit to drinking alcohol today. States he is homeless does have a history of depression and was previously on lithium not taking that medication anymore since recent visit here. States his plan would be to overdose on cocaine and alcohol or drive a vehicle and no telephone pole. Denies any pain or discomfort. States he goes through cyclical episodes of depression for the past several years. As it is made worse by his homelessness.  Patient re-seen and case evaluated. On evaluation patient no longer states he is currently suicidal. He states he has chronic suicidal ideations with no plan. He denies any overt suicidal gestures or attempts. He states his thoughts do not fluctuate with alcohol or substance use. " I just drank yesterday because I was bored. He has frequented our facility several times in the past month, and has depression that is complicated by homelessness, substance abuse, and some entitlement. Upon chart review he was last discharge 2 weeks ago to [facility] and states he doesn't want to go back there because you have to work 40 hours a week and only get paid $10 a week. He has also denied other resources from social work that include [place], [place], and other resources placed by [employer] is evidient that patient is seeking comfort in the hospital for housing and medications. He does not follow up with outpatient resources as directed, as he was recently discharged two weeks ago and never refilled his prescriptions. Although he remains open to treatment and appears future oriented. Outaptient follow up at [facility] advised, and patient instructed to call and make an appointment. He has current prescriptions at his outpatient pharmacy [facility] in [place]. He will be psych  cleared at this time and IVC to be rescinded.  Patient able to contract for safety.

## 2020-05-22 NOTE — ED Notes (Signed)
Hourly rounding reveals patient in rest room. No complaints, stable, in no acute distress. Q15 minute rounds and monitoring via Security Cameras to continue. 

## 2020-05-27 ENCOUNTER — Encounter: Payer: Medicaid Other | Admitting: General Surgery

## 2020-05-31 ENCOUNTER — Ambulatory Visit: Payer: Self-pay | Admitting: Family Medicine

## 2020-05-31 NOTE — Telephone Encounter (Signed)
Pt reports swelling of both legs, onset 1-1/2 weeks ago. States at times "Twice the size, other times not as bad." Swelling from knees through feet. Denies any redness or warmth, "A little pain at ankles only." Also reports SOB at times at rest and with exertion x 1 month.  Occured "Once  yesterday, not today." Speech non halting during call, denies SOB presently, can lie flat at HS. Also reports leg cramps at times, both legs.  Covid screening completed; consulted with [place] due to SOB; states ok to schedule OV.  No availability with Dr. Wynetta Emery until Wednesday, offered another provider; states wishes to see Dr. Wynetta Emery. Care advise given. Advised ED if symptoms worsen,any  worsening SOB,wheezing, CP, tightness, any pain redness, warmth to legs presents.  Pt verbalizes understanding. Reason for Disposition . [1] MODERATE leg swelling (e.g., swelling extends up to knees) AND [2] new onset or worsening  Answer Assessment - Initial Assessment Questions 1. ONSET: "When did the swelling start?" (e.g., minutes, hours, days)     1- 1/2 weeks ago 2. LOCATION: "What part of the leg is swollen?"  "Are both legs swollen or just one leg?"     From knees through feet 3. SEVERITY: "How bad is the swelling?" (e.g., localized; mild, moderate, severe)  - Localized - small area of swelling localized to one leg  - MILD pedal edema - swelling limited to foot and ankle, pitting edema < 1/4 inch (6 mm) deep, rest and elevation eliminate most or all swelling  - MODERATE edema - swelling of lower leg to knee, pitting edema > 1/4 inch (6 mm) deep, rest and elevation only partially reduce swelling  - SEVERE edema - swelling extends above knee, facial or hand swelling present     Varies, from "Twice the size to a little." 4. REDNESS: "Does the swelling look red or infected?"     no 5. PAIN: "Is the swelling painful to touch?" If so, ask: "How painful is it?"   (Scale 1-10; mild, moderate or severe)     "Little at  ankles" 6. FEVER: "Do you have a fever?" If so, ask: "What is it, how was it measured, and when did it start?"      no 7. CAUSE: "What do you think is causing the leg swelling?"     no 8. MEDICAL HISTORY: "Do you have a history of heart failure, kidney disease, liver failure, or cancer?"     no 9. RECURRENT SYMPTOM: "Have you had leg swelling before?" If so, ask: "When was the last time?" "What happened that time?"     no 10. OTHER SYMPTOMS: "Do you have any other symptoms?" (e.g., chest pain, difficulty breathing)       SOB at rest and with exertion.  Protocols used: LEG SWELLING AND EDEMA-A-AH

## 2020-06-02 ENCOUNTER — Encounter: Payer: Self-pay | Admitting: Family Medicine

## 2020-06-02 ENCOUNTER — Ambulatory Visit (INDEPENDENT_AMBULATORY_CARE_PROVIDER_SITE_OTHER): Payer: Medicaid Other | Admitting: Family Medicine

## 2020-06-02 ENCOUNTER — Other Ambulatory Visit: Payer: Self-pay

## 2020-06-02 VITALS — BP 128/87 | HR 72 | Temp 98.7°F | Ht 68.86 in | Wt 196.4 lb

## 2020-06-02 DIAGNOSIS — R609 Edema, unspecified: Secondary | ICD-10-CM

## 2020-06-02 NOTE — Progress Notes (Signed)
BP 128/87   Pulse 72   Temp 98.7 F (37.1 C)   Ht 5' 8.86" (1.749 m)   Wt 196 lb 6 oz (89.1 kg)   SpO2 94%   BMI 29.12 kg/m    Subjective:    Patient ID: Johnny Jewett., male    DOB: Nov 28, 1971, 49 y.o.   MRN: [id number]  HPI: Johnny [name]. is a 49 y.o. male  Chief Complaint  Patient presents with  . Edema    Bilateral foot and ankle, patient states that it gets worse as the day goes on   Erick notes today concerned about peripheral edema. He notes that he has been having swelling in his legs bilaterally for about 1-2 weeks. It's better in the AM and worse in the PM. No redness, no heat. No SOB, no CP. He is otherwise feeling well with no other concerns or complaints at this time.  Relevant past medical, surgical, family and social history reviewed and updated as indicated. Interim medical history since our last visit reviewed. Allergies and medications reviewed and updated.  Review of Systems  Constitutional: Negative.   Respiratory: Negative.   Cardiovascular: Positive for leg swelling. Negative for chest pain and palpitations.  Gastrointestinal: Negative.   Musculoskeletal: Positive for arthralgias, back pain and myalgias. Negative for gait problem, joint swelling, neck pain and neck stiffness.  Skin: Negative.   Neurological: Negative.   Psychiatric/Behavioral: Negative.     Per HPI unless specifically indicated above     Objective:    BP 128/87   Pulse 72   Temp 98.7 F (37.1 C)   Ht 5' 8.86" (1.749 m)   Wt 196 lb 6 oz (89.1 kg)   SpO2 94%   BMI 29.12 kg/m   Wt Readings from Last 3 Encounters:  06/02/20 196 lb 6 oz (89.1 kg)  05/21/20 205 lb (93 kg)  04/26/20 203 lb 14.8 oz (92.5 kg)    Physical Exam Vitals and nursing note reviewed.  Constitutional:      General: He is not in acute distress.    Appearance: Normal appearance. He is not ill-appearing, toxic-appearing or diaphoretic.  HENT:     Head: Normocephalic and atraumatic.      Right Ear: External ear normal.     Left Ear: External ear normal.     Nose: Nose normal.     Mouth/Throat:     Mouth: Mucous membranes are moist.     Pharynx: Oropharynx is clear.  Eyes:     General: No scleral icterus.       Right eye: No discharge.        Left eye: No discharge.     Extraocular Movements: Extraocular movements intact.     Conjunctiva/sclera: Conjunctivae normal.     Pupils: Pupils are equal, round, and reactive to light.  Cardiovascular:     Rate and Rhythm: Normal rate and regular rhythm.     Pulses: Normal pulses.     Heart sounds: Normal heart sounds. No murmur heard.  No friction rub. No gallop.   Pulmonary:     Effort: Pulmonary effort is normal. No respiratory distress.     Breath sounds: Normal breath sounds. No stridor. No wheezing, rhonchi or rales.  Chest:     Chest wall: No tenderness.  Musculoskeletal:        General: Normal range of motion.     Cervical back: Normal range of motion and neck supple.     Right  lower leg: Edema (trace) present.     Left lower leg: Edema (trace) present.  Skin:    General: Skin is warm and dry.     Capillary Refill: Capillary refill takes less than 2 seconds.     Coloration: Skin is not jaundiced or pale.     Findings: No bruising, erythema, lesion or rash.  Neurological:     General: No focal deficit present.     Mental Status: He is alert and oriented to person, place, and time. Mental status is at baseline.  Psychiatric:        Mood and Affect: Mood normal.        Behavior: Behavior normal.        Thought Content: Thought content normal.        Judgment: Judgment normal.     Results for orders placed or performed in visit on 32/99/24  Basic metabolic panel  Result Value Ref Range   Glucose 105 (H) 65 - 99 mg/dL   BUN 8 6 - 24 mg/dL   Creatinine, Ser 1.00 0.76 - 1.27 mg/dL   GFR calc non Af Amer 88 >59 mL/min/1.73   GFR calc Af Amer 102 >59 mL/min/1.73   BUN/Creatinine Ratio 8 (L) 9 - 20   Sodium  140 134 - 144 mmol/L   Potassium 4.0 3.5 - 5.2 mmol/L   Chloride 97 96 - 106 mmol/L   CO2 25 20 - 29 mmol/L   Calcium 9.5 8.7 - 10.2 mg/dL  B Nat Peptide  Result Value Ref Range   BNP WILL FOLLOW   Hepatitis C Antibody  Result Value Ref Range   Hep C Virus Ab 0.1 0.0 - 0.9 s/co ratio      Assessment & Plan:   Problem List Items Addressed This Visit    None    Visit Diagnoses    Peripheral edema    -  Primary   Likely venous insuffiency. We will check labs and start compression hose. If not improving, we will get him into vascular. Call with any concerns.    Relevant Orders   Basic metabolic panel (Completed)   B Nat Peptide (Completed)   Hepatitis C Antibody (Completed)       Follow up plan: Return if symptoms worsen or fail to improve.

## 2020-06-03 ENCOUNTER — Encounter: Payer: Self-pay | Admitting: Family Medicine

## 2020-06-03 LAB — BASIC METABOLIC PANEL
BUN/Creatinine Ratio: 8 — ABNORMAL LOW (ref 9–20)
BUN: 8 mg/dL (ref 6–24)
CO2: 25 mmol/L (ref 20–29)
Calcium: 9.5 mg/dL (ref 8.7–10.2)
Chloride: 97 mmol/L (ref 96–106)
Creatinine, Ser: 1 mg/dL (ref 0.76–1.27)
GFR calc Af Amer: 102 mL/min/{1.73_m2} (ref >59)
GFR calc non Af Amer: 88 mL/min/{1.73_m2} (ref >59)
Glucose: 105 mg/dL — ABNORMAL HIGH (ref 65–99)
Potassium: 4 mmol/L (ref 3.5–5.2)
Sodium: 140 mmol/L (ref 134–144)

## 2020-06-03 LAB — BRAIN NATRIURETIC PEPTIDE: BNP: 3.6 pg/mL (ref 0.0–100.0)

## 2020-06-03 LAB — HEPATITIS C ANTIBODY: Hep C Virus Ab: 0.1 s/co ratio (ref 0.0–0.9)

## 2020-06-10 ENCOUNTER — Telehealth: Payer: Self-pay | Admitting: Family Medicine

## 2020-06-10 ENCOUNTER — Other Ambulatory Visit: Payer: Self-pay | Admitting: Nurse Practitioner

## 2020-06-10 MED ORDER — TIZANIDINE HCL 4 MG PO TABS: 4.0000 mg | ORAL_TABLET | Freq: Four times a day (QID) | ORAL | 0 refills | Status: DC | PRN

## 2020-06-10 NOTE — Telephone Encounter (Signed)
Have updated this to 60 total tablets, which on review Dr. Wynetta Emery has ordered on past prescriptions.  Will need follow-up with Dr. Wynetta Emery.

## 2020-06-10 NOTE — Telephone Encounter (Signed)
Copied from [place] [phone]. Topic: General - Other >> Jun 10, 2020  1:20 PM Mcneil, Ja-Kwan wrote: Reason for CRM: Pt called regarding the Rx refill for both his medications. Pt stated he does not know who Dr. Mallie Darting is but the Rx for tiZANidine (ZANAFLEX) 4 MG tablet should have been for 90 tablets because he takes 3 tablets daily. Pt requests call back

## 2020-06-10 NOTE — Telephone Encounter (Signed)
Can you update prescriptions for his medications, which was written by hospital doctor, and the correct amount was not given.

## 2020-06-14 ENCOUNTER — Other Ambulatory Visit: Payer: Self-pay | Admitting: Family Medicine

## 2020-06-14 MED ORDER — GABAPENTIN 400 MG PO CAPS: 1200.0000 mg | ORAL_CAPSULE | Freq: Three times a day (TID) | ORAL | 6 refills | Status: DC

## 2020-06-14 NOTE — Telephone Encounter (Signed)
Routing to provider  

## 2020-06-14 NOTE — Telephone Encounter (Signed)
Per Jolene, patient needs a follow up, when would you like to see him?

## 2020-06-14 NOTE — Telephone Encounter (Signed)
Pt called in to request a refill for his gabapentin (NEURONTIN) 400 MG capsule   Pharmacy:  [id number] - [place], [address] Phone:  [phone]  Fax:  [phone]

## 2020-06-14 NOTE — Telephone Encounter (Signed)
Requested medication (s) are due for refill today -unsure  Requested medication (s) are on the active medication list -yes  Future visit scheduled -no  Last refill: 05/04/20  Notes to clinic: Request for medication prescribed by outside provider  Requested Prescriptions  Pending Prescriptions Disp Refills   gabapentin (NEURONTIN) 400 MG capsule 90 capsule 0    Sig: Take 3 capsules (1,200 mg total) by mouth 3 (three) times daily.      Neurology: Anticonvulsants - gabapentin Passed - 06/14/2020 10:29 AM      Passed - Valid encounter within last 12 months    Recent Outpatient Visits           1 week ago Peripheral edema   [place], Megan [place], DO   2 months ago Lumbar radicular pain   [place], [place], DO   3 months ago Tobacco abuse   [place], [place], DO   4 months ago [place], [place], [place]   6 months ago Appointment canceled by hospital   [facility], Megan P, DO                  Requested Prescriptions  Pending Prescriptions Disp Refills   gabapentin (NEURONTIN) 400 MG capsule 90 capsule 0    Sig: Take 3 capsules (1,200 mg total) by mouth 3 (three) times daily.      Neurology: Anticonvulsants - gabapentin Passed - 06/14/2020 10:29 AM      Passed - Valid encounter within last 12 months    Recent Outpatient Visits           1 week ago Peripheral edema   [place], Megan P, DO   2 months ago Lumbar radicular pain   [place], [place], DO   3 months ago Tobacco abuse   [place], [place], DO   4 months ago [place], [place]   6 months ago Appointment canceled by hospital   [facility], [place], DO

## 2020-06-15 NOTE — Telephone Encounter (Signed)
12 months

## 2020-06-15 NOTE — Telephone Encounter (Signed)
Lvm to make this apt. 

## 2020-06-16 NOTE — Telephone Encounter (Signed)
Pt understood and confirmed understating of message from provider. Pt stated he is living out of town at the moment and would call to make this apt.

## 2020-06-22 ENCOUNTER — Other Ambulatory Visit: Payer: Self-pay | Admitting: Nurse Practitioner

## 2020-06-22 NOTE — Telephone Encounter (Signed)
Requested medication (s) are due for refill today: yes  Requested medication (s) are on the active medication list: yes  Last refill: 06/15/20  Future visit scheduled: no  Notes to clinic: not delegated    Requested Prescriptions  Pending Prescriptions Disp Refills   tiZANidine (ZANAFLEX) 4 MG tablet [Pharmacy Med Name: TIZANIDINE HCL 4 MG TABLET] 60 tablet 0    Sig: Take 1 tablet (4 mg total) by mouth every 6 (six) hours as needed for muscle spasms.      Not Delegated - Cardiovascular:  Alpha-2 Agonists - tizanidine Failed - 06/22/2020  3:04 PM      Failed - This refill cannot be delegated      Passed - Valid encounter within last 6 months    Recent Outpatient Visits           2 weeks ago Peripheral edema   [place], Megan P, DO   2 months ago Lumbar radicular pain   [place], [place], DO   3 months ago Tobacco abuse   [place], [place], DO   4 months ago [place], [place]   6 months ago Appointment canceled by hospital   [facility], [place], DO

## 2020-06-22 NOTE — Telephone Encounter (Signed)
15 day supply sent on 06/10/2020. Medication due 06/25/2020  Routing to provider

## 2020-07-21 ENCOUNTER — Other Ambulatory Visit (HOSPITAL_COMMUNITY): Payer: Self-pay | Admitting: Psychiatry

## 2020-10-07 DIAGNOSIS — F1120 Opioid dependence, uncomplicated: Secondary | ICD-10-CM | POA: Insufficient documentation

## 2020-10-07 DIAGNOSIS — F1220 Cannabis dependence, uncomplicated: Secondary | ICD-10-CM | POA: Insufficient documentation

## 2020-10-07 DIAGNOSIS — Z91199 Patient's noncompliance with other medical treatment and regimen due to unspecified reason: Secondary | ICD-10-CM | POA: Insufficient documentation

## 2020-10-07 DIAGNOSIS — F4321 Adjustment disorder with depressed mood: Secondary | ICD-10-CM | POA: Insufficient documentation

## 2020-11-07 DIAGNOSIS — J9601 Acute respiratory failure with hypoxia: Secondary | ICD-10-CM | POA: Insufficient documentation

## 2020-11-07 DIAGNOSIS — E876 Hypokalemia: Secondary | ICD-10-CM | POA: Insufficient documentation

## 2020-11-07 DIAGNOSIS — R600 Localized edema: Secondary | ICD-10-CM | POA: Insufficient documentation

## 2020-11-07 DIAGNOSIS — R079 Chest pain, unspecified: Secondary | ICD-10-CM | POA: Insufficient documentation

## 2020-11-07 DIAGNOSIS — R7989 Other specified abnormal findings of blood chemistry: Secondary | ICD-10-CM | POA: Insufficient documentation

## 2020-11-10 DIAGNOSIS — J449 Chronic obstructive pulmonary disease, unspecified: Secondary | ICD-10-CM | POA: Insufficient documentation

## 2020-11-11 DIAGNOSIS — I10 Essential (primary) hypertension: Secondary | ICD-10-CM | POA: Insufficient documentation

## 2020-11-11 DIAGNOSIS — I5189 Other ill-defined heart diseases: Secondary | ICD-10-CM | POA: Insufficient documentation

## 2020-11-11 DIAGNOSIS — F419 Anxiety disorder, unspecified: Secondary | ICD-10-CM | POA: Insufficient documentation

## 2020-11-12 DIAGNOSIS — R0781 Pleurodynia: Secondary | ICD-10-CM | POA: Insufficient documentation

## 2020-11-25 DIAGNOSIS — F10939 Alcohol use, unspecified with withdrawal, unspecified: Secondary | ICD-10-CM | POA: Insufficient documentation

## 2020-11-26 DIAGNOSIS — E878 Other disorders of electrolyte and fluid balance, not elsewhere classified: Secondary | ICD-10-CM | POA: Insufficient documentation

## 2020-11-26 DIAGNOSIS — K920 Hematemesis: Secondary | ICD-10-CM | POA: Insufficient documentation

## 2020-11-26 DIAGNOSIS — R1032 Left lower quadrant pain: Secondary | ICD-10-CM | POA: Insufficient documentation

## 2020-12-02 DIAGNOSIS — S72001A Fracture of unspecified part of neck of right femur, initial encounter for closed fracture: Secondary | ICD-10-CM | POA: Insufficient documentation

## 2021-01-18 ENCOUNTER — Telehealth: Payer: Self-pay

## 2021-01-18 NOTE — Telephone Encounter (Signed)
Transition Care Management Follow-up Telephone Call  Date of discharge and from where: 01/16/2021 from [facility]  How have you been since you were released from the hospital? Patient stated that he is still feeling bad. Patient has moved to [facility].   Any questions or concerns? No  Items Reviewed:  Did the pt receive and understand the discharge instructions provided? Yes   Medications obtained and verified? Yes   Other? No   Any new allergies since your discharge? No   Dietary orders reviewed? N/A  Do you have support at home? Yes   Functional Questionnaire: (I = Independent and D = Dependent) ADLs: I  Bathing/Dressing- I  Meal Prep- I  Eating- I  Maintaining continence- I  Transferring/Ambulation- I - difficult due to pain.  Managing Meds- I  Follow up appointments reviewed:   PCP Hospital f/u appt confirmed? Pt is going to find a PCP closer to home.   Are transportation arrangements needed? No  If their condition worsens, is the pt aware to call PCP or go to the Emergency Dept.? Yes Was the patient provided with contact information for the PCP's office or ED? Yes Was to pt encouraged to call back with questions or concerns? Yes

## 2021-02-21 ENCOUNTER — Telehealth: Payer: Self-pay | Admitting: *Deleted

## 2021-02-21 NOTE — Telephone Encounter (Signed)
Transition Care Management Follow-up Telephone Call  Date of discharge and from where: 02/18/2021 [facility] ED  How have you been since you were released from the hospital? "I am doing as well as I can"  Any questions or concerns? No  Items Reviewed:  Did the pt receive and understand the discharge instructions provided? Yes   Medications obtained and verified? Yes   Other? No   Any new allergies since your discharge? No   Dietary orders reviewed? Yes  Do you have support at home? Yes   Home Care and Equipment/Supplies: Were home health services ordered? not applicable If so, what is the name of the agency? N/A  Has the agency set up a time to come to the patient's home? not applicable Were any new equipment or medical supplies ordered?  No What is the name of the medical supply agency? N/A Were you able to get the supplies/equipment? not applicable Do you have any questions related to the use of the equipment or supplies? No  Functional Questionnaire: (I = Independent and D = Dependent) ADLs: I  Bathing/Dressing- I  Meal Prep- I  Eating- I  Maintaining continence- I  Transferring/Ambulation- I  Managing Meds- I  Follow up appointments reviewed:   PCP Hospital f/u appt confirmed? No    Specialist Hospital f/u appt confirmed? No    Are transportation arrangements needed? No   If their condition worsens, is the pt aware to call PCP or go to the Emergency Dept.? Yes  Was the patient provided with contact information for the PCP's office or ED? Yes  Was to pt encouraged to call back with questions or concerns? Yes

## 2021-02-22 DIAGNOSIS — E663 Overweight: Secondary | ICD-10-CM | POA: Insufficient documentation

## 2021-02-22 DIAGNOSIS — Z6827 Body mass index (BMI) 27.0-27.9, adult: Secondary | ICD-10-CM | POA: Insufficient documentation

## 2021-02-22 DIAGNOSIS — Z114 Encounter for screening for human immunodeficiency virus [HIV]: Secondary | ICD-10-CM | POA: Insufficient documentation

## 2021-02-22 DIAGNOSIS — F39 Unspecified mood [affective] disorder: Secondary | ICD-10-CM | POA: Insufficient documentation

## 2021-02-22 DIAGNOSIS — Z1159 Encounter for screening for other viral diseases: Secondary | ICD-10-CM | POA: Insufficient documentation

## 2021-03-07 ENCOUNTER — Telehealth: Payer: Self-pay

## 2021-03-07 NOTE — Telephone Encounter (Signed)
Transition Care Management Unsuccessful Follow-up Telephone Call  Date of discharge and from where:  03/05/2021 from [facility]   Attempts:  1st Attempt  Reason for unsuccessful TCM follow-up call:  No answer/busy

## 2021-03-08 NOTE — Telephone Encounter (Signed)
Transition Care Management Unsuccessful Follow-up Telephone Call  Date of discharge and from where:  03/05/2021 from [facility]  Attempts:  2nd Attempt  Reason for unsuccessful TCM follow-up call:  No answer/busy

## 2021-03-09 NOTE — Telephone Encounter (Signed)
Transition Care Management Unsuccessful Follow-up Telephone Call  Date of discharge and from where:  03/05/2021 - [facility]  Attempts:  3rd Attempt  Reason for unsuccessful TCM follow-up call:  No answer/busy

## 2021-03-15 ENCOUNTER — Telehealth: Payer: Self-pay | Admitting: *Deleted

## 2021-03-15 NOTE — Telephone Encounter (Signed)
Transition Care Management Unsuccessful Follow-up Telephone Call  Date of discharge and from where:  03-14-2021  [facility]  Attempts:  1st  Reason for unsuccessful TCM follow-up call: no answer no voicemail

## 2021-03-16 ENCOUNTER — Telehealth: Payer: Self-pay | Admitting: *Deleted

## 2021-03-16 NOTE — Telephone Encounter (Signed)
Transition Care Management Unsuccessful Follow-up Telephone Call  Date of discharge and from where:  03/14/2021 [facility]  Attempts:  2nd Attempt  Reason for unsuccessful TCM follow-up call:  Left voice message

## 2021-03-17 NOTE — Telephone Encounter (Signed)
Transition Care Management Unsuccessful Follow-up Telephone Call  Date of discharge and from where:  03/14/2021 [facility]  Attempts:  3rd Attempt  Reason for unsuccessful TCM follow-up call:  Left voice message

## 2021-05-16 ENCOUNTER — Telehealth: Payer: Self-pay | Admitting: Licensed Clinical Social Worker

## 2021-05-16 NOTE — Telephone Encounter (Signed)
LCSW received the following e-mail "Hello!                 Hope this email finds you well! I just got off the phone with someone calling on behalf of a patient wanting a virtual visit about medication management and counseling. Let her know that we could definitely help on the counseling side.   Patient name: Johnny [name]. MRN: [id number] Best way to reach: [phone]  Thank you!!!  Starr Sinclair"

## 2021-07-22 IMAGING — CR DG LUMBAR SPINE 2-3V
2 series · 2 of 2 positions shown · non-contrast
Comparison: 12/11/2017 lumbar spine MRI

CLINICAL DATA: Lumbar laminectomy and L4-5 discectomy

EXAM:
LUMBAR SPINE - 2-3 VIEW

[lateral (1 of 2)]
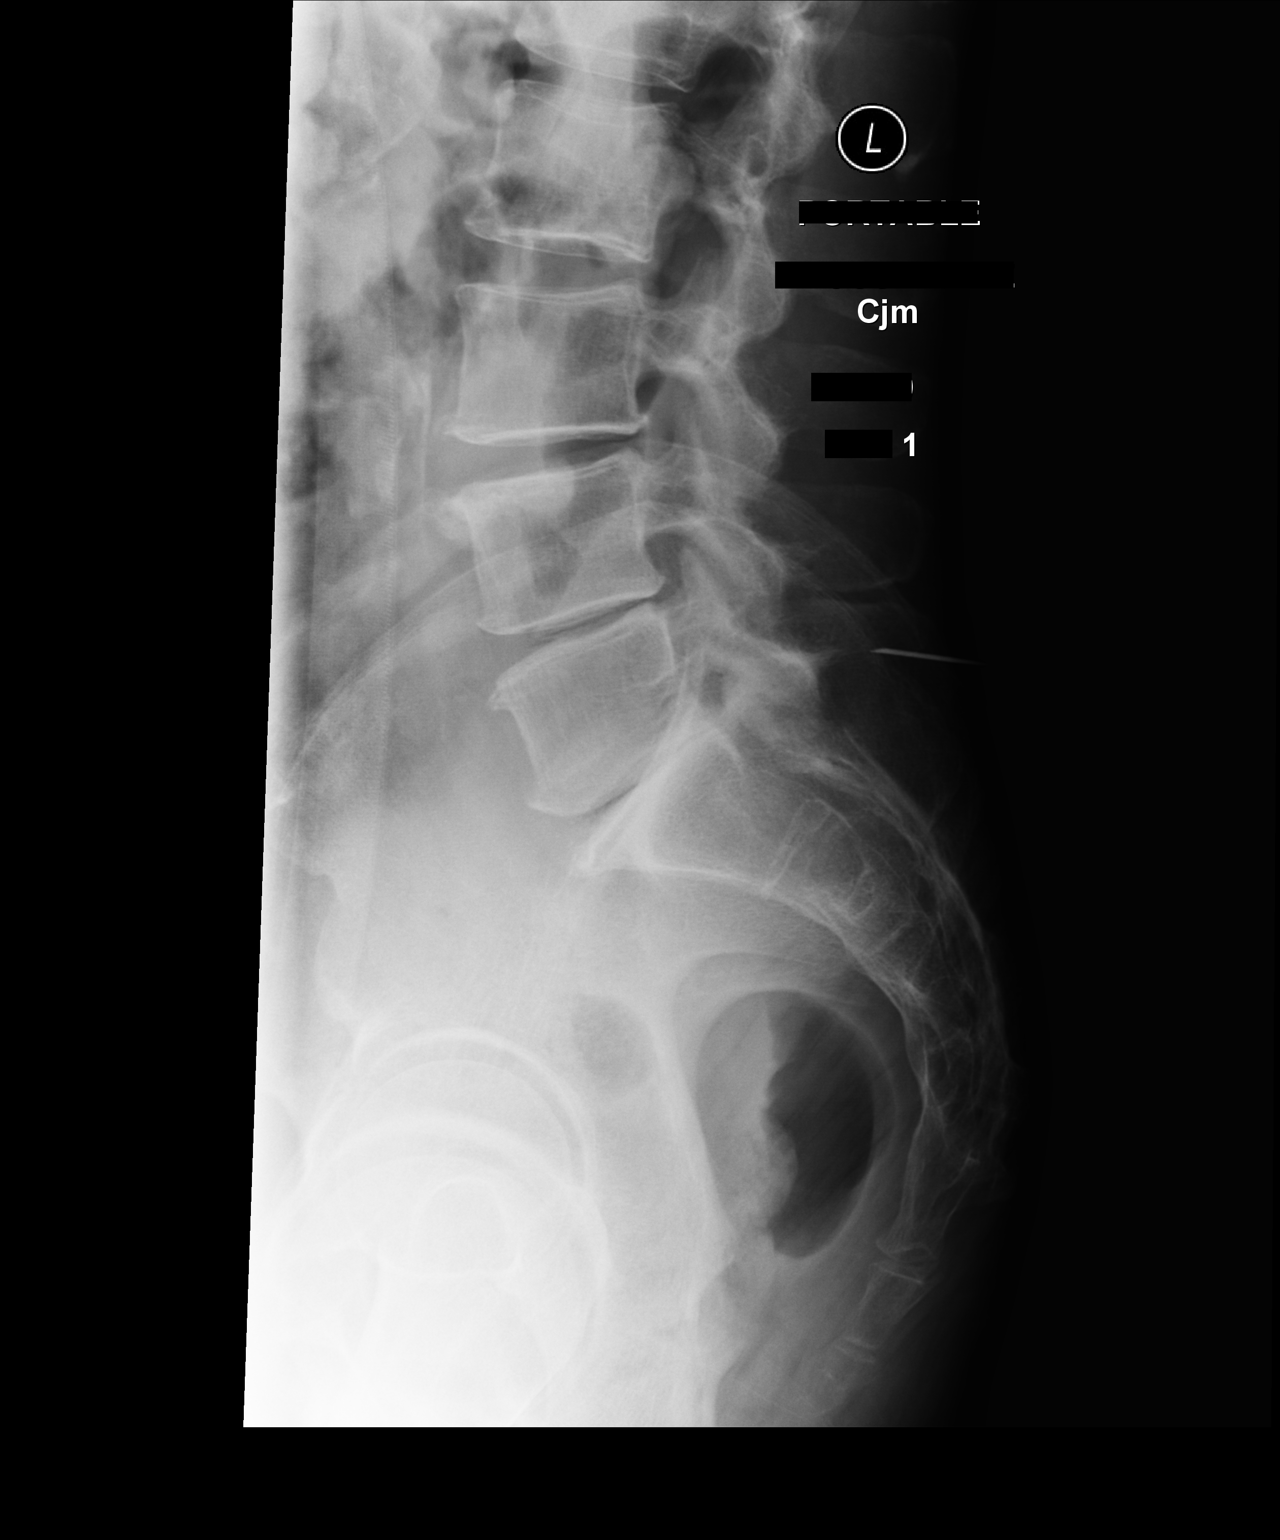

[lateral (2 of 2)]
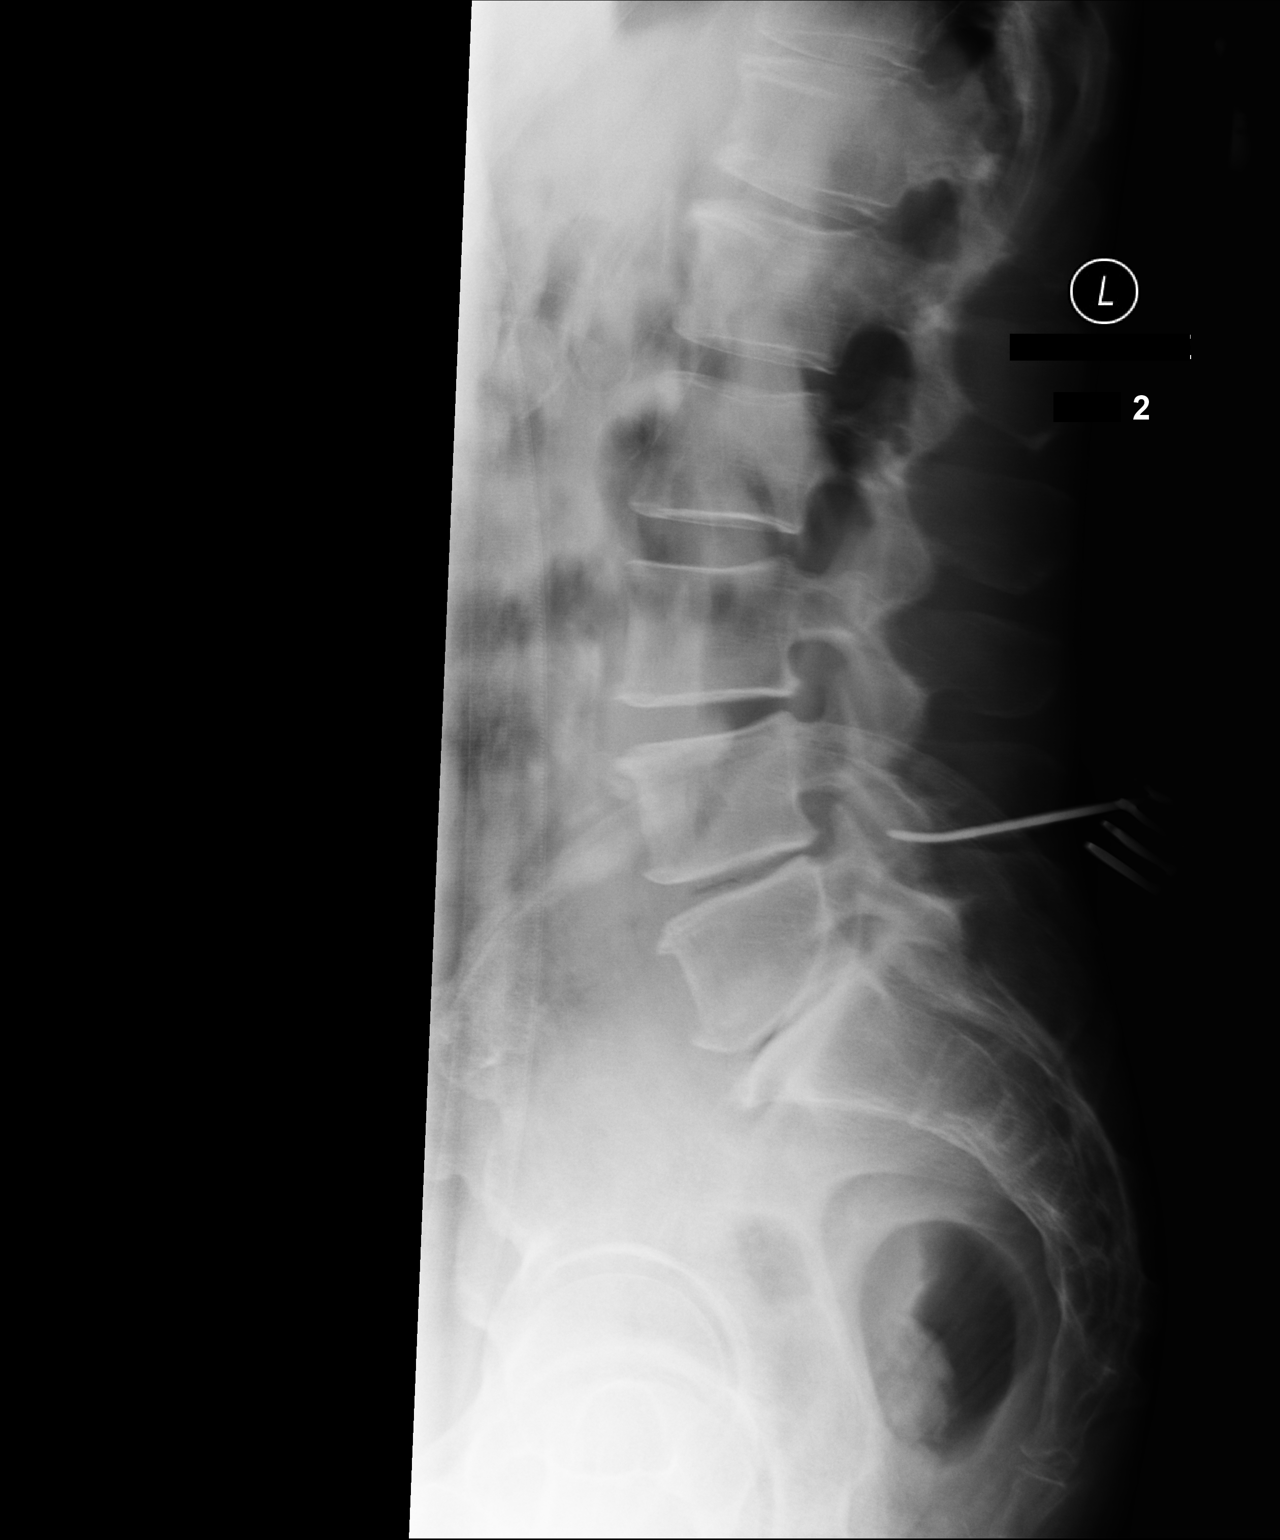

[2 of 2 positions shown; findings below may reference images not displayed]

FINDINGS: Intraoperative film 1 demonstrates posterior approach surgical
marking device terminating over the L5 spinous process.
Intraoperative film 2 demonstrates posterior approach surgical
marking device terminating over L4-5 facet joints at the level of
the L4-5 disc. Moderate multilevel lumbar degenerative disc disease.
IMPRESSION: Posterior surgical marking device positions as detailed.

## 2021-07-25 ENCOUNTER — Ambulatory Visit (INDEPENDENT_AMBULATORY_CARE_PROVIDER_SITE_OTHER): Payer: Medicaid Other | Admitting: Family Medicine

## 2021-07-25 ENCOUNTER — Other Ambulatory Visit: Payer: Self-pay

## 2021-07-25 ENCOUNTER — Encounter: Payer: Self-pay | Admitting: Family Medicine

## 2021-07-25 VITALS — BP 127/80 | HR 73 | Temp 98.0°F | Ht 68.7 in | Wt 220.8 lb

## 2021-07-25 DIAGNOSIS — G894 Chronic pain syndrome: Secondary | ICD-10-CM

## 2021-07-25 DIAGNOSIS — F339 Major depressive disorder, recurrent, unspecified: Secondary | ICD-10-CM | POA: Diagnosis not present

## 2021-07-25 DIAGNOSIS — F1910 Other psychoactive substance abuse, uncomplicated: Secondary | ICD-10-CM | POA: Diagnosis not present

## 2021-07-25 DIAGNOSIS — S92245G Nondisplaced fracture of medial cuneiform of left foot, subsequent encounter for fracture with delayed healing: Secondary | ICD-10-CM

## 2021-07-25 DIAGNOSIS — Z125 Encounter for screening for malignant neoplasm of prostate: Secondary | ICD-10-CM

## 2021-07-25 DIAGNOSIS — K2100 Gastro-esophageal reflux disease with esophagitis, without bleeding: Secondary | ICD-10-CM

## 2021-07-25 DIAGNOSIS — Z79899 Other long term (current) drug therapy: Secondary | ICD-10-CM

## 2021-07-25 DIAGNOSIS — F315 Bipolar disorder, current episode depressed, severe, with psychotic features: Secondary | ICD-10-CM

## 2021-07-25 MED ORDER — GABAPENTIN 600 MG PO TABS: 600.0000 mg | ORAL_TABLET | Freq: Three times a day (TID) | ORAL | 2 refills | Status: DC

## 2021-07-25 MED ORDER — QUETIAPINE FUMARATE 100 MG PO TABS: 100.0000 mg | ORAL_TABLET | Freq: Every day | ORAL | 0 refills | Status: DC

## 2021-07-25 MED ORDER — QUETIAPINE FUMARATE 300 MG PO TABS: 300.0000 mg | ORAL_TABLET | Freq: Every day | ORAL | 0 refills | Status: DC

## 2021-07-25 NOTE — Progress Notes (Signed)
BP 127/80   Pulse 73   Temp 98 F (36.7 C) (Oral)   Ht 5' 8.7" (1.745 m)   Wt 220 lb 12.8 oz (100.2 kg)   SpO2 95%   BMI 32.89 kg/m    Subjective:    Patient ID: Johnny [name]., male    DOB: December 13, 1971, 50 y.o.   MRN: [id number]  HPI: Keigo Whalley. is a 50 y.o. male who presents today after being lost to follow up for over a year  Chief Complaint  Patient presents with   Depression   Pain   DEPRESSION- notes that he has been hospitalized several times in the last year. Most recently at [facility]. He notes that he has only been on seroquel, but he is out of his meds. He has not established with an outpatient psychiatrist.  Mood status: uncontrolled Satisfied with current treatment?: yes Symptom severity: severe  Duration of current treatment : months Side effects: no Medication compliance: fair compliance Psychotherapy/counseling: no  Previous psychiatric medications: seroquel Depressed mood: yes Anxious mood: yes Anhedonia: no Significant weight loss or gain: no Insomnia: no  Fatigue: yes Feelings of worthlessness or guilt: no Impaired concentration/indecisiveness: no Suicidal ideations: no Hopelessness: no Crying spells: no Depression screen [facility] 2/9 07/25/2021 06/02/2020 10/07/2019 07/03/2019  Decreased Interest 3 3 0 3  Down, Depressed, Hopeless 3 2 0 0  PHQ - 2 Score 6 5 0 3  Altered sleeping 3 3 - 3  Tired, decreased energy 3 3 - 3  Change in appetite 3 3 - 1  Feeling bad or failure about yourself  0 2 - 0  Trouble concentrating 0 0 - 0  Moving slowly or fidgety/restless 0 0 - 0  Suicidal thoughts 0 0 - 0  PHQ-9 Score 15 16 - 10  Difficult doing work/chores Not difficult at all Very difficult - -  Some recent data might be hidden   Broke his hip several months ago. Has been having pain in his L ankle. Went to an ER and they said it was fine, then got a letter that said it was broken. He went to see ortho and they said it was not broken. He  notes that he has been in quite a bit of pain. No other concerns or complaints at this time.    Relevant past medical, surgical, family and social history reviewed and updated as indicated. Interim medical history since our last visit reviewed. Allergies and medications reviewed and updated.  Review of Systems  Constitutional: Negative.   Respiratory: Negative.    Cardiovascular: Negative.   Gastrointestinal: Negative.   Musculoskeletal:  Positive for arthralgias, back pain, joint swelling and myalgias. Negative for gait problem, neck pain and neck stiffness.  Skin: Negative.   Neurological: Negative.   Psychiatric/Behavioral:  Positive for dysphoric mood. Negative for agitation, behavioral problems, confusion, decreased concentration, hallucinations, self-injury, sleep disturbance and suicidal ideas. The patient is nervous/anxious. The patient is not hyperactive.    Per HPI unless specifically indicated above     Objective:    BP 127/80   Pulse 73   Temp 98 F (36.7 C) (Oral)   Ht 5' 8.7" (1.745 m)   Wt 220 lb 12.8 oz (100.2 kg)   SpO2 95%   BMI 32.89 kg/m   Wt Readings from Last 3 Encounters:  07/25/21 220 lb 12.8 oz (100.2 kg)  06/02/20 196 lb 6 oz (89.1 kg)  05/21/20 205 lb (93 kg)    Physical  Exam Vitals and nursing note reviewed.  Constitutional:      General: He is not in acute distress.    Appearance: Normal appearance. He is not ill-appearing, toxic-appearing or diaphoretic.  HENT:     Head: Normocephalic and atraumatic.     Right Ear: External ear normal.     Left Ear: External ear normal.     Nose: Nose normal.     Mouth/Throat:     Mouth: Mucous membranes are moist.     Pharynx: Oropharynx is clear.  Eyes:     General: No scleral icterus.       Right eye: No discharge.        Left eye: No discharge.     Extraocular Movements: Extraocular movements intact.     Conjunctiva/sclera: Conjunctivae normal.     Pupils: Pupils are equal, round, and reactive to  light.  Cardiovascular:     Rate and Rhythm: Normal rate and regular rhythm.     Pulses: Normal pulses.     Heart sounds: Normal heart sounds. No murmur heard.   No friction rub. No gallop.  Pulmonary:     Effort: Pulmonary effort is normal. No respiratory distress.     Breath sounds: Normal breath sounds. No stridor. No wheezing, rhonchi or rales.  Chest:     Chest wall: No tenderness.  Musculoskeletal:        General: Swelling (over lateral malelous on L ankle) and tenderness present.     Cervical back: Normal range of motion and neck supple.  Skin:    General: Skin is warm and dry.     Capillary Refill: Capillary refill takes less than 2 seconds.     Coloration: Skin is not jaundiced or pale.     Findings: No bruising, erythema, lesion or rash.  Neurological:     General: No focal deficit present.     Mental Status: He is alert and oriented to person, place, and time. Mental status is at baseline.  Psychiatric:        Mood and Affect: Mood normal.        Behavior: Behavior normal.        Thought Content: Thought content normal.        Judgment: Judgment normal.    Results for orders placed or performed in visit on 07/25/21  Comprehensive metabolic panel  Result Value Ref Range   Glucose 89 65 - 99 mg/dL   BUN 10 6 - 24 mg/dL   Creatinine, Ser 0.96 0.76 - 1.27 mg/dL   eGFR 96 >59 mL/min/1.73   BUN/Creatinine Ratio 10 9 - 20   Sodium 138 134 - 144 mmol/L   Potassium CANCELED mmol/L   Chloride 101 96 - 106 mmol/L   CO2 21 20 - 29 mmol/L   Calcium 9.3 8.7 - 10.2 mg/dL   Total Protein 7.7 6.0 - 8.5 g/dL   Albumin 4.8 4.0 - 5.0 g/dL   Globulin, Total 2.9 1.5 - 4.5 g/dL   Albumin/Globulin Ratio 1.7 1.2 - 2.2   Bilirubin Total 0.3 0.0 - 1.2 mg/dL   Alkaline Phosphatase 157 (H) 44 - 121 IU/L   AST 26 0 - 40 IU/L   ALT 10 0 - 44 IU/L  CBC with Differential/Platelet  Result Value Ref Range   WBC 7.6 3.4 - 10.8 x10E3/uL   RBC 5.09 4.14 - 5.80 x10E6/uL   Hemoglobin 15.1  13.0 - 17.7 g/dL   Hematocrit 44.8 37.5 - 51.0 %   MCV  88 79 - 97 fL   MCH 29.7 26.6 - 33.0 pg   MCHC 33.7 31.5 - 35.7 g/dL   RDW 13.2 11.6 - 15.4 %   Platelets 218 150 - 450 x10E3/uL   Neutrophils 49 Not Estab. %   Lymphs 44 Not Estab. %   Monocytes 6 Not Estab. %   Eos 1 Not Estab. %   Basos 0 Not Estab. %   Neutrophils Absolute 3.7 1.4 - 7.0 x10E3/uL   Lymphocytes Absolute 3.3 (H) 0.7 - 3.1 x10E3/uL   Monocytes Absolute 0.5 0.1 - 0.9 x10E3/uL   EOS (ABSOLUTE) 0.1 0.0 - 0.4 x10E3/uL   Basophils Absolute 0.0 0.0 - 0.2 x10E3/uL   Immature Granulocytes 0 Not Estab. %   Immature Grans (Abs) 0.0 0.0 - 0.1 x10E3/uL  Lipid Panel w/o Chol/HDL Ratio  Result Value Ref Range   Cholesterol, Total 180 100 - 199 mg/dL   Triglycerides 100 0 - 149 mg/dL   HDL 41 >39 mg/dL   VLDL Cholesterol Cal 18 5 - 40 mg/dL   LDL Chol Calc (NIH) 121 (H) 0 - 99 mg/dL  PSA  Result Value Ref Range   Prostate Specific Ag, Serum 0.5 0.0 - 4.0 ng/mL  TSH  Result Value Ref Range   TSH 3.110 0.450 - 4.500 uIU/mL  Urinalysis, Routine w reflex microscopic  Result Value Ref Range   Specific Gravity, UA 1.025 1.005 - 1.030   pH, UA 6.0 5.0 - 7.5   Color, UA Yellow Yellow   Appearance Ur Clear Clear   Leukocytes,UA Negative Negative   Protein,UA Negative Negative/Trace   Glucose, UA Negative Negative   Ketones, UA Negative Negative   RBC, UA Negative Negative   Bilirubin, UA Negative Negative   Urobilinogen, Ur 0.2 0.2 - 1.0 mg/dL   Nitrite, UA Negative Negative  Bayer DCA Hb A1c Waived  Result Value Ref Range   HB A1C (BAYER DCA - WAIVED) 5.2 <7.0 %      Assessment & Plan:   Problem List Items Addressed This Visit       Digestive   Reflux esophagitis    Doing well off medicine. Rechecking labs today. Await results.        Relevant Orders   CBC with Differential/Platelet (Completed)     Other   Chronic pain syndrome    Refill of his gabapentin given today.        Relevant Medications    acetaminophen (TYLENOL) 325 MG tablet   tiZANidine (ZANAFLEX) 4 MG tablet   gabapentin (NEURONTIN) 600 MG tablet   Bipolar affective disorder, depressed, severe, with psychotic behavior ([place])    Discussed how I do not manage psychiatric illnesses within a year of hospitalization. Refills for 1 month sent to his pharmacy. Information about [facility] given today as well as a walk-in in [place]. Referral to psychiatry made today. Continue to monitor.       Depression, recurrent ([place])    Discussed how I do not manage psychiatric illnesses within a year of hospitalization. Refills for 1 month sent to his pharmacy. Information about [facility] given today as well as a walk-in in [place]. Referral to psychiatry made today. Continue to monitor.       Relevant Orders   Ambulatory referral to Psychiatry   Substance abuse ([place])    Follow up with psych. No controlled substances from this office.        Other Visit Diagnoses     Closed nondisplaced fracture of medial cuneiform  of left foot with delayed healing, subsequent encounter    -  Primary   Per note from outside ER. Information for [facility] given. Encouraged patient to go today.   Relevant Orders   Ambulatory referral to Orthopedic Surgery   Long-term use of high-risk medication       Labs drawn today. Await results. Treat as needed.    Relevant Orders   Comprehensive metabolic panel (Completed)   CBC with Differential/Platelet (Completed)   Lipid Panel w/o Chol/HDL Ratio (Completed)   TSH (Completed)   Urinalysis, Routine w reflex microscopic (Completed)   Bayer DCA Hb A1c Waived (Completed)   Screening for prostate cancer       Labs drawn today. Await results. Treat as needed.    Relevant Orders   PSA (Completed)        Follow up plan: Return as able physical.

## 2021-07-25 NOTE — Patient Instructions (Addendum)
Walk in for immediate Psych care: Address [place], [place] [address] [phone]  I'm trying to get you an appointment with: [facility] [address] [place], [place], [place] [address] Phone: [phone]  [facility] [place], [place] [address] Phone: [phone] Fax: [phone] Open until [phone]

## 2021-07-26 ENCOUNTER — Encounter: Payer: Self-pay | Admitting: Family Medicine

## 2021-07-26 DIAGNOSIS — F1910 Other psychoactive substance abuse, uncomplicated: Secondary | ICD-10-CM | POA: Insufficient documentation

## 2021-07-26 DIAGNOSIS — F339 Major depressive disorder, recurrent, unspecified: Secondary | ICD-10-CM | POA: Insufficient documentation

## 2021-07-26 LAB — URINALYSIS, ROUTINE W REFLEX MICROSCOPIC
Bilirubin, UA: NEGATIVE
Glucose, UA: NEGATIVE
Ketones, UA: NEGATIVE
Leukocytes,UA: NEGATIVE
Nitrite, UA: NEGATIVE
Protein,UA: NEGATIVE
RBC, UA: NEGATIVE
Specific Gravity, UA: 1.025 (ref 1.005–1.030)
Urobilinogen, Ur: 0.2 mg/dL (ref 0.2–1.0)
pH, UA: 6 (ref 5.0–7.5)

## 2021-07-26 LAB — CBC WITH DIFFERENTIAL/PLATELET
Basophils Absolute: 0 10*3/uL (ref 0.0–0.2)
Basos: 0 %
EOS (ABSOLUTE): 0.1 10*3/uL (ref 0.0–0.4)
Eos: 1 %
Hematocrit: 44.8 % (ref 37.5–51.0)
Hemoglobin: 15.1 g/dL (ref 13.0–17.7)
Immature Grans (Abs): 0 10*3/uL (ref 0.0–0.1)
Immature Granulocytes: 0 %
Lymphocytes Absolute: 3.3 10*3/uL — ABNORMAL HIGH (ref 0.7–3.1)
Lymphs: 44 %
MCH: 29.7 pg (ref 26.6–33.0)
MCHC: 33.7 g/dL (ref 31.5–35.7)
MCV: 88 fL (ref 79–97)
Monocytes Absolute: 0.5 10*3/uL (ref 0.1–0.9)
Monocytes: 6 %
Neutrophils Absolute: 3.7 10*3/uL (ref 1.4–7.0)
Neutrophils: 49 %
Platelets: 218 10*3/uL (ref 150–450)
RBC: 5.09 x10E6/uL (ref 4.14–5.80)
RDW: 13.2 % (ref 11.6–15.4)
WBC: 7.6 10*3/uL (ref 3.4–10.8)

## 2021-07-26 LAB — TSH: TSH: 3.11 u[IU]/mL (ref 0.450–4.500)

## 2021-07-26 LAB — COMPREHENSIVE METABOLIC PANEL
ALT: 10 IU/L (ref 0–44)
AST: 26 IU/L (ref 0–40)
Albumin/Globulin Ratio: 1.7 (ref 1.2–2.2)
Albumin: 4.8 g/dL (ref 4.0–5.0)
Alkaline Phosphatase: 157 IU/L — ABNORMAL HIGH (ref 44–121)
BUN/Creatinine Ratio: 10 (ref 9–20)
BUN: 10 mg/dL (ref 6–24)
Bilirubin Total: 0.3 mg/dL (ref 0.0–1.2)
CO2: 21 mmol/L (ref 20–29)
Calcium: 9.3 mg/dL (ref 8.7–10.2)
Chloride: 101 mmol/L (ref 96–106)
Creatinine, Ser: 0.96 mg/dL (ref 0.76–1.27)
Globulin, Total: 2.9 g/dL (ref 1.5–4.5)
Glucose: 89 mg/dL (ref 65–99)
Sodium: 138 mmol/L (ref 134–144)
Total Protein: 7.7 g/dL (ref 6.0–8.5)
eGFR: 96 mL/min/{1.73_m2} (ref >59)

## 2021-07-26 LAB — LIPID PANEL W/O CHOL/HDL RATIO
Cholesterol, Total: 180 mg/dL (ref 100–199)
HDL: 41 mg/dL
LDL Chol Calc (NIH): 121 mg/dL — ABNORMAL HIGH (ref 0–99)
Triglycerides: 100 mg/dL (ref 0–149)
VLDL Cholesterol Cal: 18 mg/dL (ref 5–40)

## 2021-07-26 LAB — BAYER DCA HB A1C WAIVED: HB A1C (BAYER DCA - WAIVED): 5.2 % (ref <7.0)

## 2021-07-26 LAB — PSA: Prostate Specific Ag, Serum: 0.5 ng/mL (ref 0.0–4.0)

## 2021-07-26 IMAGING — MR MR LUMBAR SPINE WO/W CM
6 of 7 series · 34 of 48 positions shown · IV contrast (Contrast agent)
Comparison: 10/06/2019

CLINICAL DATA: Two days post lumbar surgery with worsening back
pain. Fall today.

EXAM:
MRI LUMBAR SPINE WITHOUT AND WITH CONTRAST
TECHNIQUE: Multiplanar and multiecho pulse sequences of the lumbar spine were
obtained without and with intravenous contrast.
CONTRAST:  9mL GADAVIST GADOBUTROL 1 MMOL/ML IV SOLN

[Series 5: T2 · sagittal · 4.0mm · 1.02mm/px · 5 of 17 slices shown (1 of 2)]
[im 1/17]
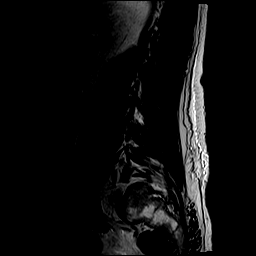
[im 5/17]
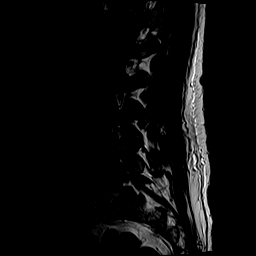
[im 9/17]
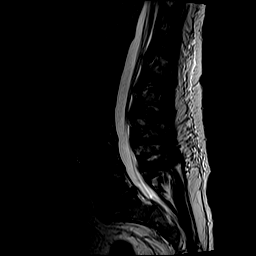
[im 13/17]
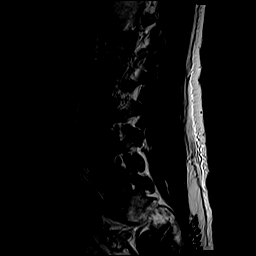
[im 17/17]
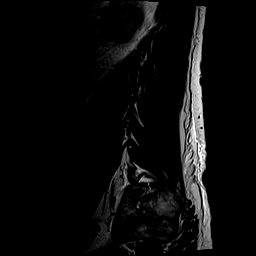

[Series 6: T1 · sagittal · 4.0mm · 1.02mm/px · 5 of 17 slices shown (1 of 2)]
[im 1/17]
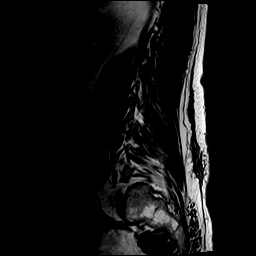
[im 5/17]
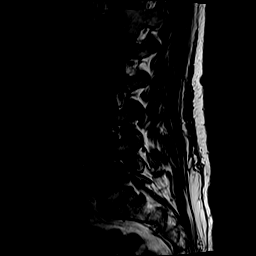
[im 9/17]
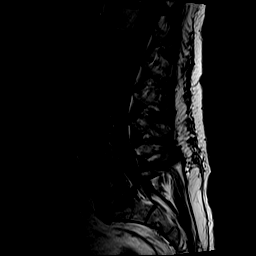
[im 13/17]
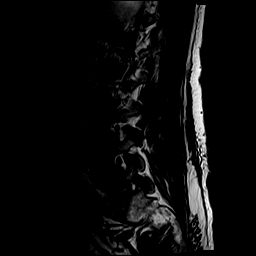
[im 17/17]
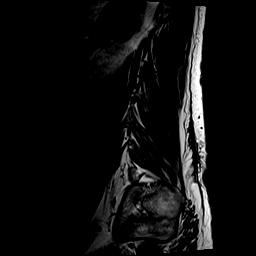

[Series 7: STIR · sagittal · 4.0mm · 0.51mm/px · 4 of 17 slices shown]
[im 1/17]
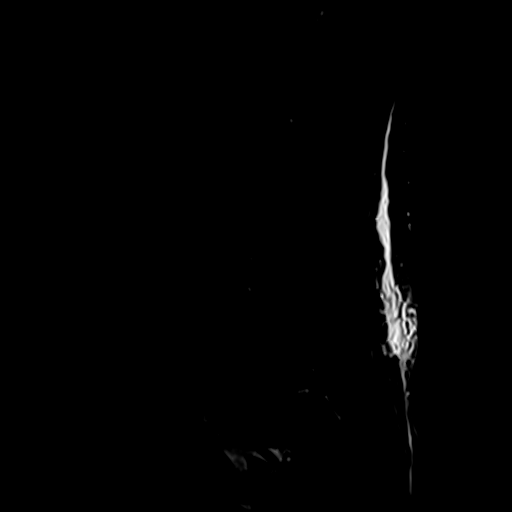
[im 6/17]
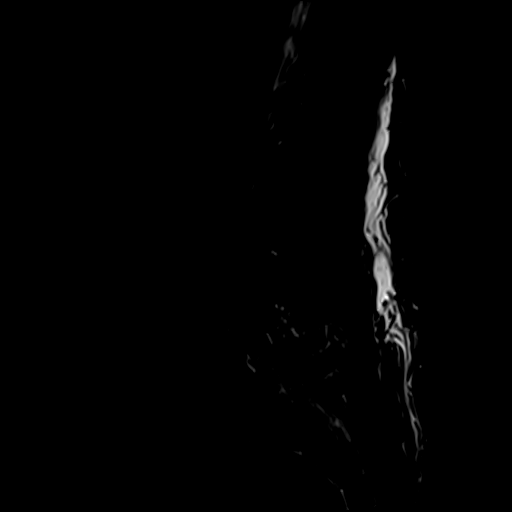
[im 11/17]
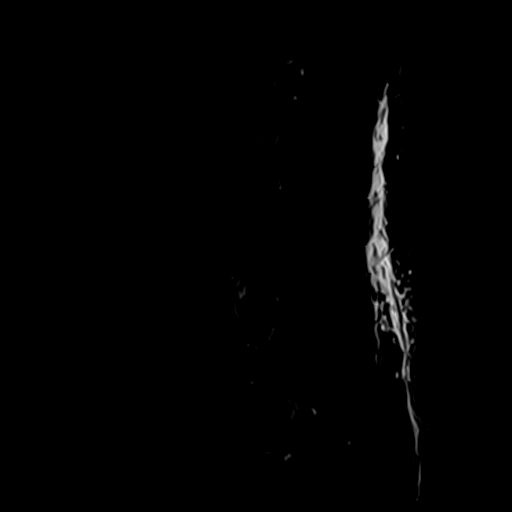
[im 17/17]
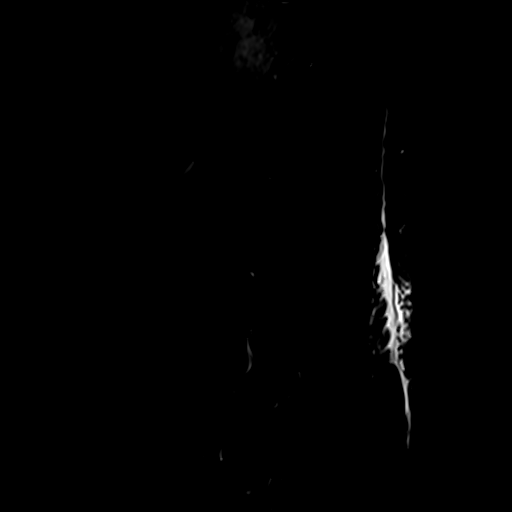

[Series 9: T2 · axial · 4.0mm · 0.78mm/px · z∈[-126,+105]mm · 8 of 38 slices shown (2 of 2)]
[im 1/38]
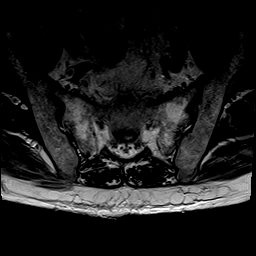
[im 5/38]
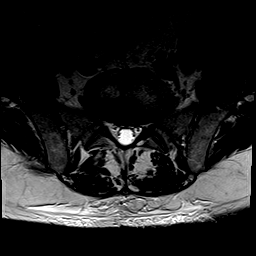
[im 13/38]
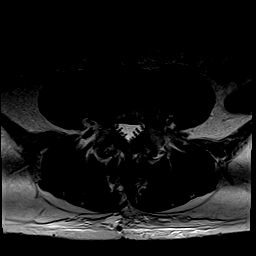
[im 17/38]
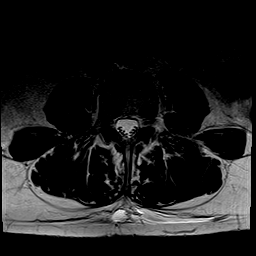
[im 21/38]
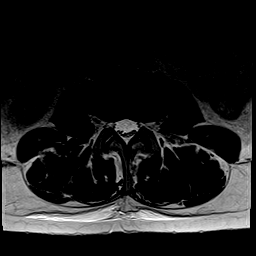
[im 25/38]
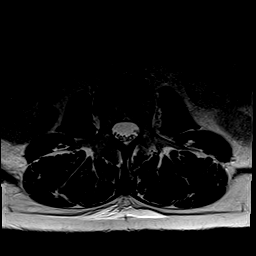
[im 33/38]
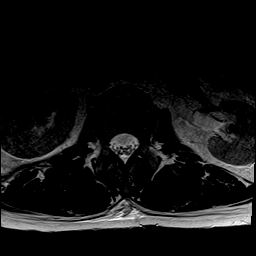
[im 38/38]
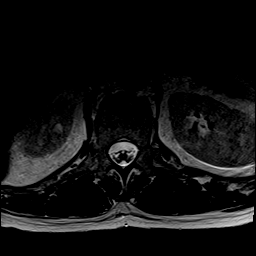

[Series 10: T1 · axial · 4.0mm · 0.39mm/px · z∈[-126,+105]mm · 8 of 38 slices shown (2 of 2)]
[im 1/38]
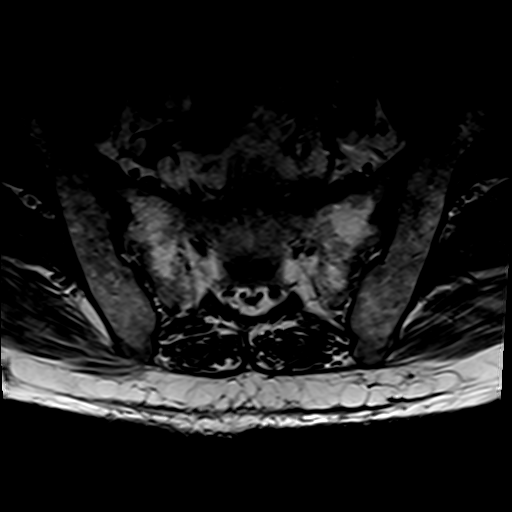
[im 5/38]
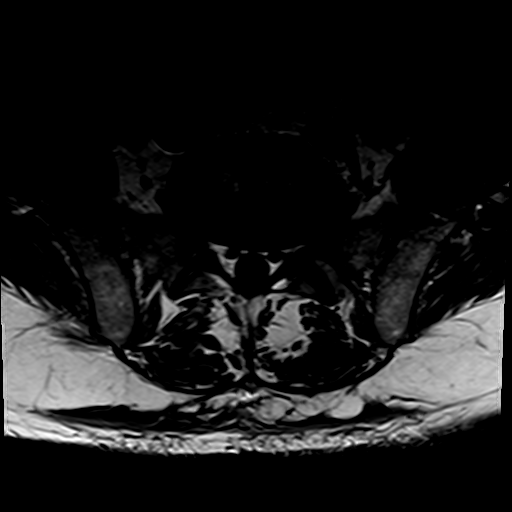
[im 13/38]
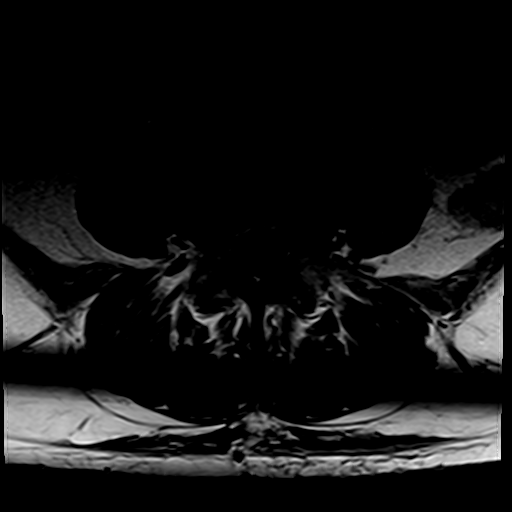
[im 17/38]
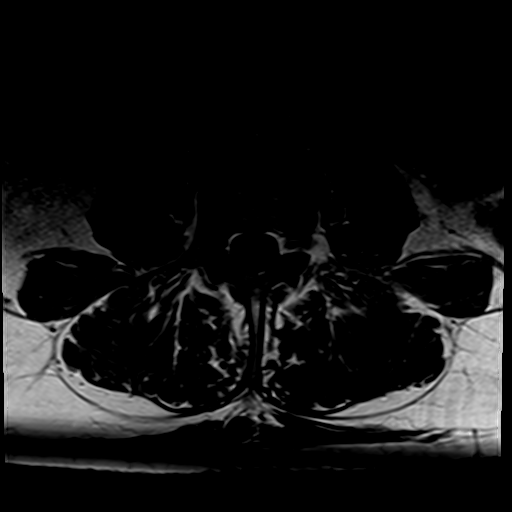
[im 21/38]
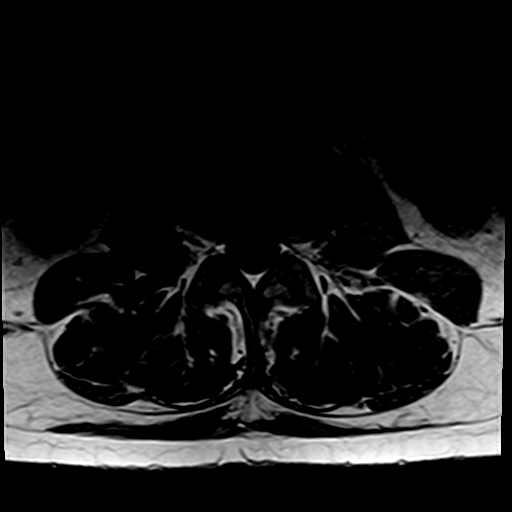
[im 25/38]
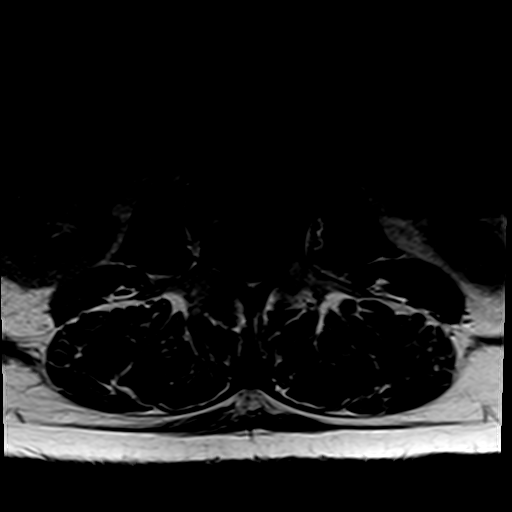
[im 33/38]
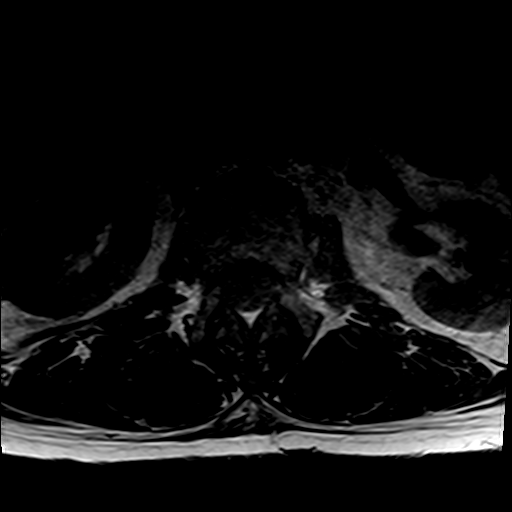
[im 38/38]
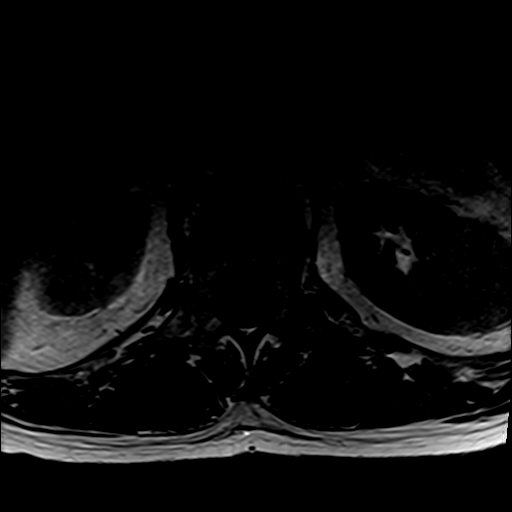

[Series 13: T1 fat-sat post-contrast · sagittal · 4.0mm · 1.02mm/px · 4 of 17 slices shown]
[im 1/17]
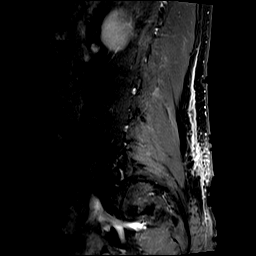
[im 6/17]
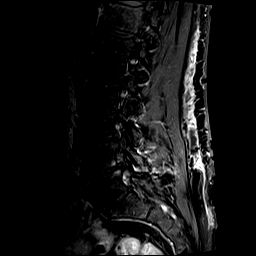
[im 11/17]
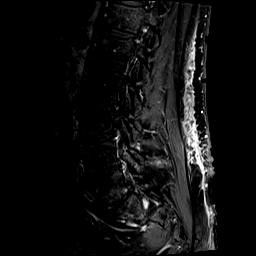
[im 17/17]
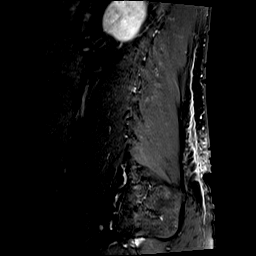

[34 of 48 positions shown; findings below may reference images not displayed]

FINDINGS: Segmentation:  Standard lumbar numbering

Alignment:  Physiologic

Vertebrae: Interval right laminotomy at L4-5. No fracture, discitis,
or aggressive bone lesion. There is mild discogenic edema the L4-5
disc space.

Conus medullaris and cauda equina: Conus extends to the T12-L1
level. Conus and cauda equina appear normal.

Paraspinal and other soft tissues: Small subcutaneous seroma at the
operative site, non worrisome. Small volume fluid at the L4 right
laminectomy site, expected and without mass effect.

Disc levels:

T12- L1: Spondylosis.  No impingement

L1-L2: Unremarkable.

L2-L3: Mild annulus bulging

L3-L4: Disc narrowing and bulging with posterior annular fissure.
Mild facet spurring. No impingement

L4-L5: Disc narrowing and bulging with endplate spurring. Downward
migrating right paracentral extrusion with right L5 impingement,
degree of impingement similar to preoperative study. Mild right
foraminal narrowing with partial effacement of perineural fat mainly
due to bulging disc.

L5-S1:Disc narrowing and bulging with endplate ridging. Degenerative
facet spurring asymmetric to the left. Borderline moderate left
foraminal narrowing comma stenosis less prominent than on outside
comparison. Right foraminal narrowing is mild and the spinal canal
is patent.
IMPRESSION: Recent L4-5 right laminotomy with residual or recurrent inferiorly
migrating right paracentral extrusion that impinges on the right L5
nerve root in the subarticular recess.

## 2021-07-26 NOTE — Assessment & Plan Note (Signed)
Follow up with psych. No controlled substances from this office.

## 2021-07-26 NOTE — Assessment & Plan Note (Signed)
Discussed how I do not manage psychiatric illnesses within a year of hospitalization. Refills for 1 month sent to his pharmacy. Information about [facility] given today as well as a walk-in in [place]. Referral to psychiatry made today. Continue to monitor.

## 2021-07-26 NOTE — Assessment & Plan Note (Signed)
Doing well off medicine. Rechecking labs today. Await results.

## 2021-07-26 NOTE — Assessment & Plan Note (Signed)
Refill of his gabapentin given today.

## 2021-08-03 ENCOUNTER — Encounter: Payer: Self-pay | Admitting: Family Medicine

## 2021-08-03 ENCOUNTER — Ambulatory Visit (INDEPENDENT_AMBULATORY_CARE_PROVIDER_SITE_OTHER): Payer: Medicaid Other | Admitting: Family Medicine

## 2021-08-03 ENCOUNTER — Other Ambulatory Visit: Payer: Self-pay

## 2021-08-03 VITALS — BP 142/91 | HR 83 | Temp 97.9°F | Ht 68.5 in | Wt 223.8 lb

## 2021-08-03 DIAGNOSIS — Z0100 Encounter for examination of eyes and vision without abnormal findings: Secondary | ICD-10-CM | POA: Diagnosis not present

## 2021-08-03 DIAGNOSIS — Z0000 Encounter for general adult medical examination without abnormal findings: Secondary | ICD-10-CM | POA: Diagnosis not present

## 2021-08-03 DIAGNOSIS — Z1211 Encounter for screening for malignant neoplasm of colon: Secondary | ICD-10-CM

## 2021-08-03 DIAGNOSIS — Z23 Encounter for immunization: Secondary | ICD-10-CM | POA: Diagnosis not present

## 2021-08-03 MED ORDER — OMEPRAZOLE 20 MG PO CPDR
20.0000 mg | DELAYED_RELEASE_CAPSULE | Freq: Every day | ORAL | 3 refills | Status: AC
Start: 2021-08-03 — End: ?

## 2021-08-03 NOTE — Patient Instructions (Signed)
Walk in for immediate Psych care: Address [place], [place] [address] [phone]   I'm trying to get you an appointment with: [facility] [name] [place], [place], [place] [address] Phone: [phone]   [facility] [place], [place] [address] Phone: [phone] Fax: [phone] Open until [phone]

## 2021-08-03 NOTE — Progress Notes (Addendum)
BP (!) 142/91   Pulse 83   Temp 97.9 F (36.6 C) (Oral)   Ht 5' 8.5" (1.74 m)   Wt 223 lb 12.8 oz (101.5 kg)   SpO2 98%   BMI 33.53 kg/m    Subjective:    Patient ID: Johnny Hamburger., male    DOB: 07-08-1971, 50 y.o.   MRN: [id number]  HPI: Johnny Dengel. is a 50 y.o. male presenting on 08/03/2021 for comprehensive medical examination. Current medical complaints include:none  Interim Problems from his last visit: yes  Depression Screen done today and results listed below:  Depression screen [facility] 2/9 08/03/2021 07/25/2021 06/02/2020 10/07/2019 07/03/2019  Decreased Interest 0 3 3 0 3  Down, Depressed, Hopeless 0 3 2 0 0  PHQ - 2 Score 0 6 5 0 3  Altered sleeping - 3 3 - 3  Tired, decreased energy - 3 3 - 3  Change in appetite - 3 3 - 1  Feeling bad or failure about yourself  - 0 2 - 0  Trouble concentrating - 0 0 - 0  Moving slowly or fidgety/restless - 0 0 - 0  Suicidal thoughts - 0 0 - 0  PHQ-9 Score - 15 16 - 10  Difficult doing work/chores - Not difficult at all Very difficult - -  Some recent data might be hidden    Past Medical History:  Past Medical History:  Diagnosis Date   Arthritis    hands, knees, lower back   Benign neoplasm of ascending colon    Benign neoplasm of descending colon    Bilateral sciatica    Carpal tunnel syndrome    Chronic back pain    Chronic neck pain    Chronic pain 03/12/2020   has been connected to pain management clinics. currently waiting to see psychiatrist before he can join [facility]   Chronic pain syndrome    CSF leak 11/28/2019   Duodenal ulcer without hemorrhage or perforation    GERD (gastroesophageal reflux disease)    Hand joint pain 07/02/2013   Herniated disc    Lives in homeless shelter 02/2020   housing ends as of 03/25/20. going to hotel after surgery   Traumatic amputation of left index finger 2013   Wears dentures    full upper and lower    Surgical History:  Past Surgical History:  Procedure  Laterality Date   AMPUTATION FINGER / THUMB Left 2013   COLONOSCOPY WITH PROPOFOL N/A 01/03/2018   Procedure: COLONOSCOPY WITH PROPOFOL;  Surgeon: Midge Minium, MD;  Location: [facility];  Service: Endoscopy;  Laterality: N/A;   ESOPHAGOGASTRODUODENOSCOPY (EGD) WITH PROPOFOL N/A 01/03/2018   Procedure: ESOPHAGOGASTRODUODENOSCOPY (EGD) WITH PROPOFOL;  Surgeon: Midge Minium, MD;  Location: [facility];  Service: Endoscopy;  Laterality: N/A;   HERNIA REPAIR Right    right inguinal hernia repaired twice   HIP SURGERY  2006   took bone out of hip to put into neck   HIP SURGERY Right    INGUINAL HERNIA REPAIR Left 04/23/2020   Procedure: HERNIA REPAIR INGUINAL ADULT;  Surgeon: Duanne Guess, MD;  Location: [facility];  Service: General;  Laterality: Left;   JOINT REPLACEMENT Right    knee   KNEE SURGERY Right    LUMBAR LAMINECTOMY/DECOMPRESSION MICRODISCECTOMY N/A 11/18/2019   Procedure: LUMBAR FOUR-FIVE LUMBAR LAMINECTOMY/DECOMPRESSION MICRODISCECTOMY;  Surgeon: Coletta Memos, MD;  Location: [facility];  Service: Neurosurgery;  Laterality: N/A;   LUMBAR WOUND DEBRIDEMENT N/A 12/06/2019  Procedure: LUMBAR WOUND REVISION;  Surgeon: [name], MD;  Location: [place];  Service: Neurosurgery;  Laterality: N/A;   neck fusion     POLYPECTOMY N/A 01/03/2018   Procedure: POLYPECTOMY;  Surgeon: [name], MD;  Location: [place];  Service: Endoscopy;  Laterality: N/A;   REPAIR OF CEREBROSPINAL FLUID LEAK N/A 11/28/2019   Procedure: REPAIR OF CEREBROSPINAL FLUID LEAK/LUMBAR;  Surgeon: [name], MD;  Location: [place];  Service: Neurosurgery;  Laterality: N/A;  REPAIR OF CEREBROSPINAL FLUID LEAK/LUMBAR   REPLACEMENT TOTAL KNEE Right    SHOULDER SURGERY Right 2016 X 2   SPINAL FUSION     TONSILLECTOMY     UMBILICAL HERNIA REPAIR N/A 11/07/2019   Procedure: HERNIA REPAIR UMBILICAL ADULT;  Surgeon: [name], MD;  Location: [facility];  Service: General;   Laterality: N/A;   UMBILICAL HERNIA REPAIR N/A 04/23/2020   Procedure: HERNIA REPAIR UMBILICAL ADULT;  Surgeon: [name], MD;  Location: [facility];  Service: General;  Laterality: N/A;   WOUND EXPLORATION N/A 12/15/2019   Procedure: LUMBAR WOUND EXPLORATION;  Surgeon: [name], MD;  Location: [place];  Service: Neurosurgery;  Laterality: N/A;  LUMBAR WOUND EXPLORATION    Medications:  Current Outpatient Medications on File Prior to Visit  Medication Sig   acetaminophen (TYLENOL) 325 MG tablet Take 1 tablet by mouth every 6 (six) hours as needed.   albuterol (VENTOLIN HFA) 108 (90 Base) MCG/ACT inhaler Inhale 2 puffs into the lungs every 6 (six) hours as needed.   gabapentin (NEURONTIN) 600 MG tablet Take 1 tablet (600 mg total) by mouth 3 (three) times daily.   QUEtiapine (SEROQUEL) 100 MG tablet Take 1 tablet (100 mg total) by mouth daily.   QUEtiapine (SEROQUEL) 300 MG tablet Take 1 tablet (300 mg total) by mouth at bedtime.   tiZANidine (ZANAFLEX) 4 MG tablet Take 1 tablet by mouth every 8 (eight) hours as needed.   No current facility-administered medications on file prior to visit.    Allergies:  Allergies  Allergen Reactions   Crab [Shellfish Allergy] Itching and Swelling   Flexeril [Cyclobenzaprine] Anaphylaxis   Codeine Itching   Latex Swelling    Gloves(when worn), condoms   Penicillins Nausea And Vomiting    Did it involve swelling of the face/tongue/throat, SOB, or low BP? No Did it involve sudden or severe rash/hives, skin peeling, or any reaction on the inside of your mouth or nose? No Did you need to seek medical attention at a hospital or doctor's office? No When did it last happen?      10+years If all above answers are "NO", may proceed with cephalosporin use.     Robaxin [Methocarbamol] Itching   Sulfamethoxazole-Trimethoprim Nausea Only and Other (See Comments)    Stomach pain    Tramadol Other (See Comments)    Gives headaches    Social  History:  Social History   Socioeconomic History   Marital status: Divorced    Spouse name: Not on file   Number of children: Not on file   Years of education: Not on file   Highest education level: Not on file  Occupational History   Occupation: delivery driver  Tobacco Use   Smoking status: Every Day    Packs/day: 1.00    Years: 25.00    Pack years: 25.00    Types: Cigarettes   Smokeless tobacco: Never   Tobacco comments:    will start Chantix soon.  Vaping Use   Vaping Use:  Some days  Substance and Sexual Activity   Alcohol use: No   Drug use: No   Sexual activity: Yes  Other Topics Concern   Not on file  Social History Narrative   Patient currently lives in homeless shelter with 6 other men. This arrangement ends as of 03/25/2020.  Social Work has been aggressively working with patient regarding housing and financial issues. He will go to a hotel after surgery and has a friend named [name] that will stay with him. Unsure of where he will go after that day.   He has met with [facility] at [facility] pain [facility] but will not receive any pain medicine from them until he sees a psychiatrist. This is scheduled for May.  He is currently out of pain medicine and has frequented the ER for help.   Dr. [name] will be able to order post op medicine since he has not yet signed a contract.   Social Determinants of Health   Financial Resource Strain: Not on file  Food Insecurity: Not on file  Transportation Needs: Not on file  Physical Activity: Not on file  Stress: Not on file  Social Connections: Not on file  Intimate Partner Violence: Not on file   Social History   Tobacco Use  Smoking Status Every Day   Packs/day: 1.00   Years: 25.00   Pack years: 25.00   Types: Cigarettes  Smokeless Tobacco Never  Tobacco Comments   will start Chantix soon.   Social History   Substance and Sexual Activity  Alcohol Use No    Family History:  Family History  Problem Relation Age of Onset    Cancer Father        sarcoma   Fibromyalgia Sister    Deafness Sister    Cirrhosis Paternal Grandmother    Cancer Paternal Grandfather        Pancreatic    Past medical history, surgical history, medications, allergies, family history and social history reviewed with patient today and changes made to appropriate areas of the chart.   Review of Systems  Constitutional: Negative.   HENT: Negative.    Eyes:  Positive for blurred vision. Negative for double vision, photophobia, pain, discharge and redness.  Respiratory: Negative.    Cardiovascular: Negative.   Gastrointestinal:  Positive for heartburn.  Genitourinary: Negative.   Musculoskeletal:  Positive for back pain, joint pain, myalgias and neck pain. Negative for falls.  Skin: Negative.   Neurological: Negative.   Endo/Heme/Allergies:  Positive for polydipsia. Negative for environmental allergies. Bruises/bleeds easily.  Psychiatric/Behavioral:  Positive for memory loss. Negative for depression, hallucinations, substance abuse and suicidal ideas. The patient is not nervous/anxious and does not have insomnia.   All other ROS negative except what is listed above and in the HPI.      Objective:    BP (!) 142/91   Pulse 83   Temp 97.9 F (36.6 C) (Oral)   Ht 5' 8.5" (1.74 m)   Wt 223 lb 12.8 oz (101.5 kg)   SpO2 98%   BMI 33.53 kg/m   Wt Readings from Last 3 Encounters:  08/03/21 223 lb 12.8 oz (101.5 kg)  07/25/21 220 lb 12.8 oz (100.2 kg)  06/02/20 196 lb 6 oz (89.1 kg)    Physical Exam Vitals and nursing note reviewed.  Constitutional:      General: He is not in acute distress.    Appearance: Normal appearance. He is obese. He is not ill-appearing, toxic-appearing or diaphoretic.  HENT:  Head: Normocephalic and atraumatic.     Right Ear: Tympanic membrane, ear canal and external ear normal. There is no impacted cerumen.     Left Ear: Tympanic membrane, ear canal and external ear normal. There is no impacted  cerumen.     Nose: Nose normal. No congestion or rhinorrhea.     Mouth/Throat:     Mouth: Mucous membranes are moist.     Pharynx: Oropharynx is clear. No oropharyngeal exudate or posterior oropharyngeal erythema.  Eyes:     General: No scleral icterus.       Right eye: No discharge.        Left eye: No discharge.     Extraocular Movements: Extraocular movements intact.     Conjunctiva/sclera: Conjunctivae normal.     Pupils: Pupils are equal, round, and reactive to light.  Neck:     Vascular: No carotid bruit.  Cardiovascular:     Rate and Rhythm: Normal rate and regular rhythm.     Pulses: Normal pulses.     Heart sounds: No murmur heard.   No friction rub. No gallop.  Pulmonary:     Effort: Pulmonary effort is normal. No respiratory distress.     Breath sounds: Normal breath sounds. No stridor. No wheezing, rhonchi or rales.  Chest:     Chest wall: No tenderness.  Abdominal:     General: Abdomen is flat. Bowel sounds are normal. There is no distension.     Palpations: Abdomen is soft. There is no mass.     Tenderness: There is no abdominal tenderness. There is no right CVA tenderness, left CVA tenderness, guarding or rebound.     Hernia: No hernia is present.  Genitourinary:    Comments: Genital exam deferred with shared decision making Musculoskeletal:        General: No swelling, tenderness, deformity or signs of injury.     Cervical back: Normal range of motion and neck supple. No rigidity. No muscular tenderness.     Right lower leg: No edema.     Left lower leg: No edema.  Lymphadenopathy:     Cervical: No cervical adenopathy.  Skin:    General: Skin is warm and dry.     Capillary Refill: Capillary refill takes less than 2 seconds.     Coloration: Skin is not jaundiced or pale.     Findings: No bruising, erythema, lesion or rash.  Neurological:     General: No focal deficit present.     Mental Status: He is alert and oriented to person, place, and time.      Cranial Nerves: No cranial nerve deficit.     Sensory: No sensory deficit.     Motor: No weakness.     Coordination: Coordination normal.     Gait: Gait normal.     Deep Tendon Reflexes: Reflexes normal.  Psychiatric:        Mood and Affect: Mood normal.        Behavior: Behavior normal.        Thought Content: Thought content normal.        Judgment: Judgment normal.    Results for orders placed or performed in visit on 07/25/21  Comprehensive metabolic panel  Result Value Ref Range   Glucose 89 65 - 99 mg/dL   BUN 10 6 - 24 mg/dL   Creatinine, Ser 0.96 0.76 - 1.27 mg/dL   eGFR 96 >59 mL/min/1.73   BUN/Creatinine Ratio 10 9 - 20   Sodium 138  134 - 144 mmol/L   Potassium CANCELED mmol/L   Chloride 101 96 - 106 mmol/L   CO2 21 20 - 29 mmol/L   Calcium 9.3 8.7 - 10.2 mg/dL   Total Protein 7.7 6.0 - 8.5 g/dL   Albumin 4.8 4.0 - 5.0 g/dL   Globulin, Total 2.9 1.5 - 4.5 g/dL   Albumin/Globulin Ratio 1.7 1.2 - 2.2   Bilirubin Total 0.3 0.0 - 1.2 mg/dL   Alkaline Phosphatase 157 (H) 44 - 121 IU/L   AST 26 0 - 40 IU/L   ALT 10 0 - 44 IU/L  CBC with Differential/Platelet  Result Value Ref Range   WBC 7.6 3.4 - 10.8 x10E3/uL   RBC 5.09 4.14 - 5.80 x10E6/uL   Hemoglobin 15.1 13.0 - 17.7 g/dL   Hematocrit 44.8 37.5 - 51.0 %   MCV 88 79 - 97 fL   MCH 29.7 26.6 - 33.0 pg   MCHC 33.7 31.5 - 35.7 g/dL   RDW 13.2 11.6 - 15.4 %   Platelets 218 150 - 450 x10E3/uL   Neutrophils 49 Not Estab. %   Lymphs 44 Not Estab. %   Monocytes 6 Not Estab. %   Eos 1 Not Estab. %   Basos 0 Not Estab. %   Neutrophils Absolute 3.7 1.4 - 7.0 x10E3/uL   Lymphocytes Absolute 3.3 (H) 0.7 - 3.1 x10E3/uL   Monocytes Absolute 0.5 0.1 - 0.9 x10E3/uL   EOS (ABSOLUTE) 0.1 0.0 - 0.4 x10E3/uL   Basophils Absolute 0.0 0.0 - 0.2 x10E3/uL   Immature Granulocytes 0 Not Estab. %   Immature Grans (Abs) 0.0 0.0 - 0.1 x10E3/uL  Lipid Panel w/o Chol/HDL Ratio  Result Value Ref Range   Cholesterol, Total 180 100 -  199 mg/dL   Triglycerides 100 0 - 149 mg/dL   HDL 41 >39 mg/dL   VLDL Cholesterol Cal 18 5 - 40 mg/dL   LDL Chol Calc (NIH) 121 (H) 0 - 99 mg/dL  PSA  Result Value Ref Range   Prostate Specific Ag, Serum 0.5 0.0 - 4.0 ng/mL  TSH  Result Value Ref Range   TSH 3.110 0.450 - 4.500 uIU/mL  Urinalysis, Routine w reflex microscopic  Result Value Ref Range   Specific Gravity, UA 1.025 1.005 - 1.030   pH, UA 6.0 5.0 - 7.5   Color, UA Yellow Yellow   Appearance Ur Clear Clear   Leukocytes,UA Negative Negative   Protein,UA Negative Negative/Trace   Glucose, UA Negative Negative   Ketones, UA Negative Negative   RBC, UA Negative Negative   Bilirubin, UA Negative Negative   Urobilinogen, Ur 0.2 0.2 - 1.0 mg/dL   Nitrite, UA Negative Negative  Bayer DCA Hb A1c Waived  Result Value Ref Range   HB A1C (BAYER DCA - WAIVED) 5.2 <7.0 %      Assessment & Plan:   Problem List Items Addressed This Visit   None Visit Diagnoses     Routine general medical examination at a health care facility    -  Primary   Vaccines declined/updated. Screening labs checked last visit and normal. Cologuard ordered today. Continue diet and exercise. Call with any concerns.    Eye exam, routine       Referral to opthalmology made today.   Relevant Orders   Ambulatory referral to Ophthalmology   Screening for colon cancer       Cologuard ordered today.   Relevant Orders   Cologuard  LABORATORY TESTING:  Health maintenance labs ordered today as discussed above.   The natural history of prostate cancer and ongoing controversy regarding screening and potential treatment outcomes of prostate cancer has been discussed with the patient. The meaning of a false positive PSA and a false negative PSA has been discussed. He indicates understanding of the limitations of this screening test and wishes to proceed with screening PSA testing.   IMMUNIZATIONS:   - Tdap: Tetanus vaccination status reviewed: Tdap  vaccination indicated and given today. - Influenza: Refused - Pneumovax: Refused - COVID: Refused - Shingrix vaccine: Refused  SCREENING: - Colonoscopy: cologuard ordered today  Discussed with patient purpose of the colonoscopy is to detect colon cancer at curable precancerous or early stages   PATIENT COUNSELING:    Sexuality: Discussed sexually transmitted diseases, partner selection, use of condoms, avoidance of unintended pregnancy  and contraceptive alternatives.   Advised to avoid cigarette smoking.  I discussed with the patient that most people either abstain from alcohol or drink within safe limits (<=14/week and <=4 drinks/occasion for males, <=7/weeks and <= 3 drinks/occasion for females) and that the risk for alcohol disorders and other health effects rises proportionally with the number of drinks per week and how often a drinker exceeds daily limits.  Discussed cessation/primary prevention of drug use and availability of treatment for abuse.   Diet: Encouraged to adjust caloric intake to maintain  or achieve ideal body weight, to reduce intake of dietary saturated fat and total fat, to limit sodium intake by avoiding high sodium foods and not adding table salt, and to maintain adequate dietary potassium and calcium preferably from fresh fruits, vegetables, and low-fat dairy products.    stressed the importance of regular exercise  Injury prevention: Discussed safety belts, safety helmets, smoke detector, smoking near bedding or upholstery.   Dental health: Discussed importance of regular tooth brushing, flossing, and dental visits.   Follow up plan: NEXT PREVENTATIVE PHYSICAL DUE IN 1 YEAR. Return in about 3 months (around 11/03/2021).

## 2021-08-03 NOTE — Addendum Note (Signed)
Addended by: Valerie Roys on: 08/03/2021 10:56 AM   Modules accepted: Orders

## 2021-08-12 ENCOUNTER — Telehealth: Payer: Self-pay

## 2021-08-12 DIAGNOSIS — S92215A Nondisplaced fracture of cuboid bone of left foot, initial encounter for closed fracture: Secondary | ICD-10-CM

## 2021-08-12 NOTE — Telephone Encounter (Signed)
Copied from [facility] [id number]. Topic: General - Other >> Aug 12, 2021  1:28 PM Valere Dross wrote: Reason for CRM: Pt called in stating he would like another referral sent to [facility] in [place], pt states the last referral to [place] was not helpful and they were trying to send him to other places, please advise.   Routing to referral coordinator. Can this referral be resent?

## 2021-08-15 NOTE — Telephone Encounter (Signed)
Routing to provider for new referral per referral coordinator.

## 2021-08-16 ENCOUNTER — Other Ambulatory Visit: Payer: Self-pay | Admitting: Family Medicine

## 2021-08-16 NOTE — Telephone Encounter (Signed)
Notes to clinic:  REQUEST FOR 90 DAYS PRESCRIPTION   Requested Prescriptions  Pending Prescriptions Disp Refills   QUEtiapine (SEROQUEL) 300 MG tablet [Pharmacy Med Name: QUETIAPINE FUMARATE 300 MG TAB] 90 tablet 1    Sig: TAKE 1 TABLET BY MOUTH EVERYDAY AT BEDTIME     Not Delegated - Psychiatry:  Antipsychotics - Second Generation (Atypical) - quetiapine Failed - 08/16/2021 11:36 AM      Failed - This refill cannot be delegated      Failed - Last BP in normal range    BP Readings from Last 1 Encounters:  08/03/21 (!) 142/91          Passed - ALT in normal range and within 180 days    ALT  Date Value Ref Range Status  07/25/2021 10 0 - 44 IU/L Final   SGPT (ALT)  Date Value Ref Range Status  06/16/2014 18 12 - 78 U/L Final          Passed - AST in normal range and within 180 days    AST  Date Value Ref Range Status  07/25/2021 26 0 - 40 IU/L Final   SGOT(AST)  Date Value Ref Range Status  06/16/2014 16 15 - 37 Unit/L Final          Passed - Completed PHQ-2 or PHQ-9 in the last 360 days      Passed - Valid encounter within last 6 months    Recent Outpatient Visits           1 week ago Routine general medical examination at a health care facility   [facility], [place] P, DO   3 weeks ago Closed nondisplaced fracture of medial cuneiform of left foot with delayed healing, subsequent encounter   [place], Megan P, DO   1 year ago Peripheral edema   [place], Megan P, DO   1 year ago Lumbar radicular pain   [place], [place], DO   1 year ago Tobacco abuse   [facility] [place], [place], DO       Future Appointments             In 2 months Johnson, Megan P, DO [place], [facility]             QUEtiapine (SEROQUEL) 100 MG tablet [Pharmacy Med Name: QUETIAPINE FUMARATE 100 MG TAB] 90 tablet 1    Sig: TAKE 1 TABLET BY MOUTH EVERY DAY      Not Delegated - Psychiatry:  Antipsychotics - Second Generation (Atypical) - quetiapine Failed - 08/16/2021 11:36 AM      Failed - This refill cannot be delegated      Failed - Last BP in normal range    BP Readings from Last 1 Encounters:  08/03/21 (!) 142/91          Passed - ALT in normal range and within 180 days    ALT  Date Value Ref Range Status  07/25/2021 10 0 - 44 IU/L Final   SGPT (ALT)  Date Value Ref Range Status  06/16/2014 18 12 - 78 U/L Final          Passed - AST in normal range and within 180 days    AST  Date Value Ref Range Status  07/25/2021 26 0 - 40 IU/L Final   SGOT(AST)  Date Value Ref Range Status  06/16/2014 16 15 - 37 Unit/L  Final          Passed - Completed PHQ-2 or PHQ-9 in the last 360 days      Passed - Valid encounter within last 6 months    Recent Outpatient Visits           1 week ago Routine general medical examination at a health care facility   [place] P, DO   3 weeks ago Closed nondisplaced fracture of medial cuneiform of left foot with delayed healing, subsequent encounter   [place], Megan P, DO   1 year ago Peripheral edema   [place], [place], DO   1 year ago Lumbar radicular pain   [place], [place], DO   1 year ago Tobacco abuse   [place], [place], DO       Future Appointments             In 2 months Wynetta Emery, Barb Merino, DO [employer], [facility]

## 2021-08-16 NOTE — Telephone Encounter (Signed)
Notes to clinic:  Requesting a 90 day supply    Requested Prescriptions  Pending Prescriptions Disp Refills   QUEtiapine (SEROQUEL) 300 MG tablet [Pharmacy Med Name: QUETIAPINE FUMARATE 300 MG TAB] 90 tablet 1    Sig: TAKE 1 TABLET BY MOUTH EVERYDAY AT BEDTIME     Not Delegated - Psychiatry:  Antipsychotics - Second Generation (Atypical) - quetiapine Failed - 08/16/2021  1:08 PM      Failed - This refill cannot be delegated      Failed - Last BP in normal range    BP Readings from Last 1 Encounters:  08/03/21 (!) 142/91          Passed - ALT in normal range and within 180 days    ALT  Date Value Ref Range Status  07/25/2021 10 0 - 44 IU/L Final   SGPT (ALT)  Date Value Ref Range Status  06/16/2014 18 12 - 78 U/L Final          Passed - AST in normal range and within 180 days    AST  Date Value Ref Range Status  07/25/2021 26 0 - 40 IU/L Final   SGOT(AST)  Date Value Ref Range Status  06/16/2014 16 15 - 37 Unit/L Final          Passed - Completed PHQ-2 or PHQ-9 in the last 360 days      Passed - Valid encounter within last 6 months    Recent Outpatient Visits           1 week ago Routine general medical examination at a health care facility   [facility], [place] P, DO   3 weeks ago Closed nondisplaced fracture of medial cuneiform of left foot with delayed healing, subsequent encounter   [place], Megan P, DO   1 year ago Peripheral edema   [place], Megan P, DO   1 year ago Lumbar radicular pain   [place], [place], DO   1 year ago Tobacco abuse   [facility] [place], [place], DO       Future Appointments             In 2 months Johnson, Megan P, DO [place], [facility]             QUEtiapine (SEROQUEL) 100 MG tablet [Pharmacy Med Name: QUETIAPINE FUMARATE 100 MG TAB] 90 tablet 1    Sig: TAKE 1 TABLET BY MOUTH EVERY DAY     Not  Delegated - Psychiatry:  Antipsychotics - Second Generation (Atypical) - quetiapine Failed - 08/16/2021  1:08 PM      Failed - This refill cannot be delegated      Failed - Last BP in normal range    BP Readings from Last 1 Encounters:  08/03/21 (!) 142/91          Passed - ALT in normal range and within 180 days    ALT  Date Value Ref Range Status  07/25/2021 10 0 - 44 IU/L Final   SGPT (ALT)  Date Value Ref Range Status  06/16/2014 18 12 - 78 U/L Final          Passed - AST in normal range and within 180 days    AST  Date Value Ref Range Status  07/25/2021 26 0 - 40 IU/L Final   SGOT(AST)  Date Value Ref Range Status  06/16/2014 16 15 -  49 Unit/L Final          Passed - Completed PHQ-2 or PHQ-9 in the last 360 days      Passed - Valid encounter within last 6 months    Recent Outpatient Visits           1 week ago Routine general medical examination at a health care facility   [place] P, DO   3 weeks ago Closed nondisplaced fracture of medial cuneiform of left foot with delayed healing, subsequent encounter   [place], Megan P, DO   1 year ago Peripheral edema   [place], [place], DO   1 year ago Lumbar radicular pain   [place], [place], DO   1 year ago Tobacco abuse   [place], [place], DO       Future Appointments             In 2 months Wynetta Emery, Barb Merino, DO [employer], [facility]

## 2021-08-16 NOTE — Telephone Encounter (Signed)
Routing to provider to see if 90 day supplies are appropriate.

## 2021-08-16 NOTE — Telephone Encounter (Signed)
Called and spoke with patient, he states the following: Seroquel '300mg'$  at bedtime for sleep  Seroquel '100mg'$  in the morning for anxiety  Seroquel '100mg'$  1-2 during the day as needed for anxiety

## 2021-08-16 NOTE — Telephone Encounter (Signed)
Copied from [place] [id number]. Topic: Quick Communication - Rx Refill/Question >> Aug 16, 2021  1:05 PM Leward Quan A wrote: Medication: QUEtiapine (SEROQUEL) 100 MG tablet, QUEtiapine (SEROQUEL) 300 MG tablet   Has the patient contacted their pharmacy? No. (Agent: If no, request that the patient contact the pharmacy for the refill.) (Agent: If yes, when and what did the pharmacy advise?)  Preferred Pharmacy (with phone number or street name): [id number] [place] [address]  Phone:  [phone]:  [phone]    Agent: Please be advised that RX refills may take up to 3 business days. We ask that you follow-up with your pharmacy.

## 2021-08-24 ENCOUNTER — Ambulatory Visit: Payer: Self-pay | Admitting: *Deleted

## 2021-08-24 NOTE — Telephone Encounter (Signed)
Reason for Disposition . [1] MODERATE dizziness (e.g., interferes with normal activities) AND [2] has NOT been evaluated by physician for this  (Exception: dizziness caused by heat exposure, sudden standing, or poor fluid intake) . Headache  (and neurologic deficit)  Answer Assessment - Initial Assessment Questions 1. SYMPTOM: "What is the main symptom you are concerned about?" (e.g., weakness, numbness)     Numbness in hands- unable to hold things, neck pain 2. ONSET: "When did this start?" (minutes, hours, days; while sleeping)     Started early am- 5pm 3. LAST NORMAL: "When was the last time you (the patient) were normal (no symptoms)?"     Patient reports no problems with hands yesterday 4. PATTERN "Does this come and go, or has it been constant since it started?"  "Is it present now?"     Constant-yes 5. CARDIAC SYMPTOMS: "Have you had any of the following symptoms: chest pain, difficulty breathing, palpitations?"     no 6. NEUROLOGIC SYMPTOMS: "Have you had any of the following symptoms: headache, dizziness, vision loss, double vision, changes in speech, unsteady on your feet?"     Dizzy 4 days- moderate.severe 7. OTHER SYMPTOMS: "Do you have any other symptoms?"     Possible seizure last night- ribs painful 8. PREGNANCY: "Is there any chance you are pregnant?" "When was your last menstrual period?"     N/a  Answer Assessment - Initial Assessment Questions 1. DESCRIPTION: "Describe your dizziness."     Unsteady on feet 2. LIGHTHEADED: "Do you feel lightheaded?" (e.g., somewhat faint, woozy, weak upon standing)     Somewhat faint 3. VERTIGO: "Do you feel like either you or the room is spinning or tilting?" (i.e. vertigo)     no 4. SEVERITY: "How bad is it?"  "Do you feel like you are going to faint?" "Can you stand and walk?"   - MILD: Feels slightly dizzy, but walking normally.   - MODERATE: Feels unsteady when walking, but not falling; interferes with normal activities (e.g.,  school, work).   - SEVERE: Unable to walk without falling, or requires assistance to walk without falling; feels like passing out now.      moderate 5. ONSET:  "When did the dizziness begin?"     3 days ago 6. AGGRAVATING FACTORS: "Does anything make it worse?" (e.g., standing, change in head position)     no 7. HEART RATE: "Can you tell me your heart rate?" "How many beats in 15 seconds?"  (Note: not all patients can do this)       Not asked 8. CAUSE: "What do you think is causing the dizziness?"     Patient is afraid he has had another seizure  Protocols used: Neurologic Deficit-A-AH, Dizziness - Lightheadedness-A-AH
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
## 2021-08-24 NOTE — Telephone Encounter (Signed)
Routing to provider as an FYI

## 2021-08-24 NOTE — Telephone Encounter (Signed)
Agree with ED  

## 2021-09-09 ENCOUNTER — Other Ambulatory Visit: Payer: Self-pay

## 2021-09-12 ENCOUNTER — Telehealth: Payer: Self-pay

## 2021-09-12 NOTE — Telephone Encounter (Signed)
Patient states he is taking different 2-3 a day. Patient is requesting a new Rx be sent to his pharmacy. Please advise?

## 2021-09-15 NOTE — Telephone Encounter (Signed)
Pt called to report that he is completely out of his 100 mg supply, requesting refill   [id number] [place] [place] - [address]  [address] [name] [address]  Phone: [phone] Fax: [phone]

## 2021-09-16 NOTE — Telephone Encounter (Signed)
Called pharmacy and discussed with them because both prescriptions for the Seroquel were dispensed on 08/16/21, both for 90 day supplies. Patient should not be out of either dose until around 11/16/21.  Will call and discuss with patient.

## 2021-09-16 NOTE — Telephone Encounter (Signed)
I cannot write that high of a dose. He will have to see psychiatry to be taking that much. I can get him a refill on his regular dose but I don't want him taking that much

## 2021-09-16 NOTE — Telephone Encounter (Signed)
Called and spoke with patient. Discussed with patient that his prescriptions are written for him to take one 100 mg and one 300 mg per day. Patient states he has been taking 2 to 3 tablets a day of the Seroquel 100 mg because he has been having more stress lately. He states he was told that he could do this.

## 2021-09-19 NOTE — Telephone Encounter (Signed)
Called patient and was unable to speak with patient as the phone call was answered and hung up.

## 2021-09-20 NOTE — Telephone Encounter (Signed)
Called patient and was unable to speak with patient as the phone call was answered and hung up.

## 2021-09-21 NOTE — Telephone Encounter (Signed)
Attempted to reach patient but no answer. 

## 2021-09-22 ENCOUNTER — Telehealth: Payer: Self-pay | Admitting: Family Medicine

## 2021-09-22 NOTE — Telephone Encounter (Signed)
Patient requesting QUEtiapine (SEROQUEL) 100 MG tablet to reflect 3x daily, please send new rx to pharmacy.    [id number] [place], [place] - [address] Phone:  [phone]  Fax:  [phone]

## 2021-09-22 NOTE — Telephone Encounter (Signed)
Please see and review previous phone note in the chart.

## 2021-09-22 NOTE — Telephone Encounter (Signed)
Spoke with patient he is taking seroquel 300mg  at night 100 in the morning and 100 1-2 times a day.  Advised patient that he needs to be seen to discuss.  Appointment scheduled.

## 2021-09-23 NOTE — Telephone Encounter (Signed)
I am happy to see him for this, but he will need to see psychiatry to continue that high of a dose. I advise him to go to [facility] to be seen

## 2021-09-26 ENCOUNTER — Inpatient Hospital Stay: Payer: Medicaid Other | Admitting: Family Medicine

## 2021-09-27 ENCOUNTER — Encounter: Payer: Self-pay | Admitting: General Surgery

## 2021-10-19 ENCOUNTER — Telehealth: Payer: Self-pay | Admitting: Family Medicine

## 2021-10-20 NOTE — Telephone Encounter (Signed)
error 

## 2021-10-21 ENCOUNTER — Encounter: Payer: Self-pay | Admitting: Nurse Practitioner

## 2021-10-21 ENCOUNTER — Ambulatory Visit (INDEPENDENT_AMBULATORY_CARE_PROVIDER_SITE_OTHER): Payer: Medicaid Other | Admitting: Nurse Practitioner

## 2021-10-21 ENCOUNTER — Other Ambulatory Visit: Payer: Self-pay

## 2021-10-21 VITALS — BP 132/88 | HR 96 | Temp 98.1°F | Wt 235.6 lb

## 2021-10-21 DIAGNOSIS — F1011 Alcohol abuse, in remission: Secondary | ICD-10-CM | POA: Insufficient documentation

## 2021-10-21 DIAGNOSIS — F315 Bipolar disorder, current episode depressed, severe, with psychotic features: Secondary | ICD-10-CM

## 2021-10-21 DIAGNOSIS — F17210 Nicotine dependence, cigarettes, uncomplicated: Secondary | ICD-10-CM

## 2021-10-21 MED ORDER — QUETIAPINE FUMARATE 300 MG PO TABS: ORAL_TABLET | ORAL | 1 refills | Status: DC

## 2021-10-21 MED ORDER — QUETIAPINE FUMARATE 100 MG PO TABS: 100.0000 mg | ORAL_TABLET | Freq: Every day | ORAL | 1 refills | Status: DC

## 2021-10-21 NOTE — Patient Instructions (Signed)

## 2021-10-21 NOTE — Assessment & Plan Note (Signed)
Chronic, ongoing.  He is scheduled to see psychiatry for initial visit this upcoming week.  Denies SI/HI.  Will refill medications today, labs up to date.  Recommend he maintain psychiatry visit for further recommendations.  Return in 6 months, sooner if worsening mood.

## 2021-10-21 NOTE — Assessment & Plan Note (Signed)
I have recommended complete cessation of tobacco use. I have discussed various options available for assistance with tobacco cessation including over the counter methods (Nicotine gum, patch and lozenges). We also discussed prescription options (Chantix, Nicotine Inhaler / Nasal Spray). The patient is not interested in pursuing any prescription tobacco cessation options at this time.  Recommend he obtain lung cancer CT screening and provided information to him on this for review.

## 2021-10-21 NOTE — Progress Notes (Signed)
BP 132/88   Pulse 96   Temp 98.1 F (36.7 C) (Oral)   Wt 235 lb 9.6 oz (106.9 kg)   SpO2 96%   BMI 35.30 kg/m    Subjective:    Patient ID: Johnny Jewett., male    DOB: [birth year], 50 y.o.   MRN: [id number]  HPI: Johnny Purves. is a 50 y.o. male  Chief Complaint  Patient presents with   Medication Refill    Patient is here for medication refill for Seroquel. Patient denies having any concerns at today's visit. Patient states he takes 300 mg at night to help with sleep and then he takes up to 2 tablets throughout the day to help with anxiety. Patient states he was told he it works for both anxiety and sleep. Patient states he takes it to help with both.   BIPOLAR DISORDER Currently treated with Seroquel 300 MG at bedtime and 100 MG when he wakes up and then 100 MG in the afternoon when needed for increased anxiety. Is scheduled to do an initial phone visit with psychiatry on 31st.  Denies any further alcohol use.  Does endorse use of marijuana daily.  Is a current nicotine smoker -- smokes about one pack per day.  Has smoked since age 54.  Labs up to date in August with PCP. Mood status: stable Satisfied with current treatment?: yes Symptom severity: moderate  Duration of current treatment : chronic Side effects: no Medication compliance: good compliance Psychotherapy/counseling: none Previous psychiatric medications: multiple medications Depressed mood: no Anxious mood: yes Anhedonia: no Significant weight loss or gain: no Insomnia: yes hard to fall asleep Fatigue: no Feelings of worthlessness or guilt: no Impaired concentration/indecisiveness: no Suicidal ideations: no Hopelessness: no Crying spells: no Depression screen [facility] 2/9 08/03/2021 07/25/2021 06/02/2020 10/07/2019 07/03/2019  Decreased Interest 0 3 3 0 3  Down, Depressed, Hopeless 0 3 2 0 0  PHQ - 2 Score 0 6 5 0 3  Altered sleeping - 3 3 - 3  Tired, decreased energy - 3 3 - 3  Change in appetite  - 3 3 - 1  Feeling bad or failure about yourself  - 0 2 - 0  Trouble concentrating - 0 0 - 0  Moving slowly or fidgety/restless - 0 0 - 0  Suicidal thoughts - 0 0 - 0  PHQ-9 Score - 15 16 - 10  Difficult doing work/chores - Not difficult at all Very difficult - -  Some recent data might be hidden     Relevant past medical, surgical, family and social history reviewed and updated as indicated. Interim medical history since our last visit reviewed. Allergies and medications reviewed and updated.  Review of Systems  Constitutional:  Negative for activity change, diaphoresis, fatigue and fever.  Respiratory:  Negative for cough, chest tightness, shortness of breath and wheezing.   Cardiovascular:  Negative for chest pain, palpitations and leg swelling.  Gastrointestinal: Negative.   Neurological: Negative.   Psychiatric/Behavioral: Negative.     Per HPI unless specifically indicated above     Objective:    BP 132/88   Pulse 96   Temp 98.1 F (36.7 C) (Oral)   Wt 235 lb 9.6 oz (106.9 kg)   SpO2 96%   BMI 35.30 kg/m   Wt Readings from Last 3 Encounters:  10/21/21 235 lb 9.6 oz (106.9 kg)  08/03/21 223 lb 12.8 oz (101.5 kg)  07/25/21 220 lb 12.8 oz (100.2 kg)  Physical Exam Vitals and nursing note reviewed.  Constitutional:      General: He is awake. He is not in acute distress.    Appearance: He is well-developed and well-groomed. He is obese. He is not ill-appearing or toxic-appearing.  HENT:     Head: Normocephalic and atraumatic.     Right Ear: Hearing normal. No drainage.     Left Ear: Hearing normal. No drainage.  Eyes:     General: Lids are normal.        Right eye: No discharge.        Left eye: No discharge.     Conjunctiva/sclera: Conjunctivae normal.     Pupils: Pupils are equal, round, and reactive to light.  Neck:     Thyroid: No thyromegaly.     Vascular: No carotid bruit.  Cardiovascular:     Rate and Rhythm: Normal rate and regular rhythm.      Heart sounds: Normal heart sounds, S1 normal and S2 normal. No murmur heard.   No gallop.  Pulmonary:     Effort: Pulmonary effort is normal. No accessory muscle usage or respiratory distress.     Breath sounds: Normal breath sounds.  Abdominal:     General: Bowel sounds are normal.     Palpations: Abdomen is soft. There is no hepatomegaly or splenomegaly.  Musculoskeletal:        General: Normal range of motion.     Cervical back: Normal range of motion and neck supple.     Right lower leg: No edema.     Left lower leg: No edema.  Skin:    General: Skin is warm and dry.     Capillary Refill: Capillary refill takes less than 2 seconds.     Findings: No rash.  Neurological:     Mental Status: He is alert and oriented to person, place, and time.     Deep Tendon Reflexes: Reflexes are normal and symmetric.  Psychiatric:        Mood and Affect: Mood normal.        Speech: Speech normal.        Behavior: Behavior normal. Behavior is cooperative.        Thought Content: Thought content normal.    Results for orders placed or performed in visit on 07/25/21  Comprehensive metabolic panel  Result Value Ref Range   Glucose 89 65 - 99 mg/dL   BUN 10 6 - 24 mg/dL   Creatinine, Ser 0.96 0.76 - 1.27 mg/dL   eGFR 96 >59 mL/min/1.73   BUN/Creatinine Ratio 10 9 - 20   Sodium 138 134 - 144 mmol/L   Potassium CANCELED mmol/L   Chloride 101 96 - 106 mmol/L   CO2 21 20 - 29 mmol/L   Calcium 9.3 8.7 - 10.2 mg/dL   Total Protein 7.7 6.0 - 8.5 g/dL   Albumin 4.8 4.0 - 5.0 g/dL   Globulin, Total 2.9 1.5 - 4.5 g/dL   Albumin/Globulin Ratio 1.7 1.2 - 2.2   Bilirubin Total 0.3 0.0 - 1.2 mg/dL   Alkaline Phosphatase 157 (H) 44 - 121 IU/L   AST 26 0 - 40 IU/L   ALT 10 0 - 44 IU/L  CBC with Differential/Platelet  Result Value Ref Range   WBC 7.6 3.4 - 10.8 x10E3/uL   RBC 5.09 4.14 - 5.80 x10E6/uL   Hemoglobin 15.1 13.0 - 17.7 g/dL   Hematocrit 44.8 37.5 - 51.0 %   MCV 88 79 - 97 fL  MCH 29.7  26.6 - 33.0 pg   MCHC 33.7 31.5 - 35.7 g/dL   RDW 13.2 11.6 - 15.4 %   Platelets 218 150 - 450 x10E3/uL   Neutrophils 49 Not Estab. %   Lymphs 44 Not Estab. %   Monocytes 6 Not Estab. %   Eos 1 Not Estab. %   Basos 0 Not Estab. %   Neutrophils Absolute 3.7 1.4 - 7.0 x10E3/uL   Lymphocytes Absolute 3.3 (H) 0.7 - 3.1 x10E3/uL   Monocytes Absolute 0.5 0.1 - 0.9 x10E3/uL   EOS (ABSOLUTE) 0.1 0.0 - 0.4 x10E3/uL   Basophils Absolute 0.0 0.0 - 0.2 x10E3/uL   Immature Granulocytes 0 Not Estab. %   Immature Grans (Abs) 0.0 0.0 - 0.1 x10E3/uL  Lipid Panel w/o Chol/HDL Ratio  Result Value Ref Range   Cholesterol, Total 180 100 - 199 mg/dL   Triglycerides 100 0 - 149 mg/dL   HDL 41 >39 mg/dL   VLDL Cholesterol Cal 18 5 - 40 mg/dL   LDL Chol Calc (NIH) 121 (H) 0 - 99 mg/dL  PSA  Result Value Ref Range   Prostate Specific Ag, Serum 0.5 0.0 - 4.0 ng/mL  TSH  Result Value Ref Range   TSH 3.110 0.450 - 4.500 uIU/mL  Urinalysis, Routine w reflex microscopic  Result Value Ref Range   Specific Gravity, UA 1.025 1.005 - 1.030   pH, UA 6.0 5.0 - 7.5   Color, UA Yellow Yellow   Appearance Ur Clear Clear   Leukocytes,UA Negative Negative   Protein,UA Negative Negative/Trace   Glucose, UA Negative Negative   Ketones, UA Negative Negative   RBC, UA Negative Negative   Bilirubin, UA Negative Negative   Urobilinogen, Ur 0.2 0.2 - 1.0 mg/dL   Nitrite, UA Negative Negative  Bayer DCA Hb A1c Waived  Result Value Ref Range   HB A1C (BAYER DCA - WAIVED) 5.2 <7.0 %      Assessment & Plan:   Problem List Items Addressed This Visit       Other   Nicotine dependence, cigarettes, uncomplicated    I have recommended complete cessation of tobacco use. I have discussed various options available for assistance with tobacco cessation including over the counter methods (Nicotine gum, patch and lozenges). We also discussed prescription options (Chantix, Nicotine Inhaler / Nasal Spray). The patient is not  interested in pursuing any prescription tobacco cessation options at this time.  Recommend he obtain lung cancer CT screening and provided information to him on this for review.       Bipolar affective disorder, depressed, severe, with psychotic behavior ([place]) - Primary    Chronic, ongoing.  He is scheduled to see psychiatry for initial visit this upcoming week.  Denies SI/HI.  Will refill medications today, labs up to date.  Recommend he maintain psychiatry visit for further recommendations.  Return in 6 months, sooner if worsening mood.        Follow up plan: Return in about 6 months (around 04/21/2022) for Bipolar Disorder.

## 2021-11-03 ENCOUNTER — Ambulatory Visit: Payer: Medicaid Other | Admitting: Family Medicine

## 2021-11-25 ENCOUNTER — Other Ambulatory Visit: Payer: Self-pay | Admitting: Family Medicine

## 2021-11-25 NOTE — Telephone Encounter (Signed)
Medication: gabapentin (NEURONTIN) 600 MG tablet [[id number]]  Pt has one pill left  Has the patient contacted their pharmacy? YES pharmacy advised the pt to contact the office (Agent: If no, request that the patient contact the pharmacy for the refill. If patient does not wish to contact the pharmacy document the reason why and proceed with request.) (Agent: If yes, when and what did the pharmacy advise?)  Preferred Pharmacy (with phone number or street name): [id number] [place], [place] - [address] [place] [address] Phone: [phone] Fax: [phone] Hours: Not open 24 hours   Has the patient been seen for an appointment in the last year OR does the patient have an upcoming appointment? YES 08/03/21  Agent: Please be advised that RX refills may take up to 3 business days. We ask that you follow-up with your pharmacy.

## 2021-11-25 NOTE — Telephone Encounter (Signed)
The patient has made an additional call regarding their refill  Please contact further if needed

## 2021-11-26 MED ORDER — GABAPENTIN 600 MG PO TABS: 600.0000 mg | ORAL_TABLET | Freq: Three times a day (TID) | ORAL | 2 refills | Status: DC

## 2021-11-26 NOTE — Telephone Encounter (Signed)
Requested Prescriptions  Pending Prescriptions Disp Refills  . gabapentin (NEURONTIN) 600 MG tablet 90 tablet 2    Sig: Take 1 tablet (600 mg total) by mouth 3 (three) times daily.     Neurology: Anticonvulsants - gabapentin Passed - 11/26/2021  7:11 AM      Passed - Valid encounter within last 12 months    Recent Outpatient Visits          1 month ago Bipolar affective disorder, depressed, severe, with psychotic behavior ([place])   [place], Henrine Screws T, NP   3 months ago Routine general medical examination at a health care facility   [place] P, DO   4 months ago Closed nondisplaced fracture of medial cuneiform of left foot with delayed healing, subsequent encounter   [place], [place], DO   1 year ago Peripheral edema   [place], DO   1 year ago Lumbar radicular pain   [facility] Valerie Roys, DO      Future Appointments            In 4 months Wynetta Emery, Barb Merino, DO [facility], [facility]

## 2022-01-05 ENCOUNTER — Telehealth: Payer: Self-pay | Admitting: Family Medicine

## 2022-01-05 NOTE — Telephone Encounter (Signed)
Requested medication (s) are due for refill today: NO, had rx 10/21/21 for 3 month with one refill  Requested medication (s) are on the active medication list: yes, Baclofen is from historical provider.  Last refill:  unknown  Future visit scheduled: 04/21/22  Notes to clinic:  These meds are not delegated, Baclofen from historical provider, pt has rx refill on current rx, please assess.   Requested Prescriptions  Pending Prescriptions Disp Refills   baclofen (LIORESAL) 10 MG tablet 30 each     Sig: Take 1 tablet (10 mg total) by mouth 3 (three) times daily as needed.     Not Delegated - Analgesics:  Muscle Relaxants Failed - 01/05/2022  3:08 PM      Failed - This refill cannot be delegated      Passed - Valid encounter within last 6 months    Recent Outpatient Visits           2 months ago Bipolar affective disorder, depressed, severe, with psychotic behavior ([place])   [place], Henrine Screws T, NP   5 months ago Routine general medical examination at a health care facility   [facility], [place] P, DO   5 months ago Closed nondisplaced fracture of medial cuneiform of left foot with delayed healing, subsequent encounter   [place], [place], DO   1 year ago Peripheral edema   [place], Megan P, DO   1 year ago Lumbar radicular pain   [facility] [place], Megan P, DO       Future Appointments             In 3 months Johnson, Megan P, DO [place], [facility]             QUEtiapine (SEROQUEL) 100 MG tablet 90 tablet 1    Sig: Take 1 tablet (100 mg total) by mouth daily.     Not Delegated - Psychiatry:  Antipsychotics - Second Generation (Atypical) - quetiapine Failed - 01/05/2022  3:08 PM      Failed - This refill cannot be delegated      Passed - ALT in normal range and within 180 days    ALT  Date Value Ref Range Status  07/25/2021 10 0 - 44 IU/L Final   SGPT  (ALT)  Date Value Ref Range Status  06/16/2014 18 12 - 78 U/L Final          Passed - AST in normal range and within 180 days    AST  Date Value Ref Range Status  07/25/2021 26 0 - 40 IU/L Final   SGOT(AST)  Date Value Ref Range Status  06/16/2014 16 15 - 37 Unit/L Final          Passed - Completed PHQ-2 or PHQ-9 in the last 360 days      Passed - Last BP in normal range    BP Readings from Last 1 Encounters:  10/21/21 132/88          Passed - Valid encounter within last 6 months    Recent Outpatient Visits           2 months ago Bipolar affective disorder, depressed, severe, with psychotic behavior ([place])   [place], [place] T, NP   5 months ago Routine general medical examination at a health care facility   [facility], [place] P, DO   5 months ago Closed nondisplaced fracture  of medial cuneiform of left foot with delayed healing, subsequent encounter   [place], [place], DO   1 year ago Peripheral edema   [place], Megan P, DO   1 year ago Lumbar radicular pain   [place], Megan P, DO       Future Appointments             In 3 months Johnson, Megan P, DO [place], [facility]             QUEtiapine (SEROQUEL) 300 MG tablet 90 tablet 1    Sig: TAKE 1 TABLET BY MOUTH EVERYDAY AT BEDTIME     Not Delegated - Psychiatry:  Antipsychotics - Second Generation (Atypical) - quetiapine Failed - 01/05/2022  3:08 PM      Failed - This refill cannot be delegated      Passed - ALT in normal range and within 180 days    ALT  Date Value Ref Range Status  07/25/2021 10 0 - 44 IU/L Final   SGPT (ALT)  Date Value Ref Range Status  06/16/2014 18 12 - 78 U/L Final          Passed - AST in normal range and within 180 days    AST  Date Value Ref Range Status  07/25/2021 26 0 - 40 IU/L Final   SGOT(AST)  Date Value Ref Range Status  06/16/2014  16 15 - 37 Unit/L Final          Passed - Completed PHQ-2 or PHQ-9 in the last 360 days      Passed - Last BP in normal range    BP Readings from Last 1 Encounters:  10/21/21 132/88          Passed - Valid encounter within last 6 months    Recent Outpatient Visits           2 months ago Bipolar affective disorder, depressed, severe, with psychotic behavior ([place])   [place], [place] T, NP   5 months ago Routine general medical examination at a health care facility   [facility], [place] P, DO   5 months ago Closed nondisplaced fracture of medial cuneiform of left foot with delayed healing, subsequent encounter   [place], [place], DO   1 year ago Peripheral edema   [place], Megan P, DO   1 year ago Lumbar radicular pain   [facility] Valerie Roys, DO       Future Appointments             In 3 months Wynetta Emery, Barb Merino, DO [place], [facility]

## 2022-01-05 NOTE — Telephone Encounter (Signed)
Medication: QUEtiapine (SEROQUEL) 300 MG tablet [[id number]] , QUEtiapine (SEROQUEL) 100 MG tablet [[id number]] , baclofen (LIORESAL) 10 MG tablet [[id number]]  Has the patient contacted their pharmacy? YES (Agent: If no, request that the patient contact the pharmacy for the refill. If patient does not wish to contact the pharmacy document the reason why and proceed with request.) (Agent: If yes, when and what did the pharmacy advise?)  Preferred Pharmacy (with phone number or street name): [id number] [place] - [address] [place] [address] Phone: [phone] Fax: [phone] Hours: Not open 24 hours   Has the patient been seen for an appointment in the last year OR does the patient have an upcoming appointment? YES  Agent: Please be advised that RX refills may take up to 3 business days. We ask that you follow-up with your pharmacy.

## 2022-01-06 NOTE — Telephone Encounter (Signed)
Pt called saying he is supposed to take the Seroquel generic 3 times a day.  He says that is the way the hospital prescribed the medication.  The prescription says one time a day so he keeps running out of the early.  He said he is completely out of the 100 mg.  He has one 300 mg left.  Pharmacy [place]  CB#  [phone]

## 2022-01-09 MED ORDER — QUETIAPINE FUMARATE 100 MG PO TABS: 100.0000 mg | ORAL_TABLET | Freq: Every day | ORAL | 0 refills | Status: AC

## 2022-01-09 NOTE — Telephone Encounter (Signed)
I will send in a short refill of the medication for patient.  Please have him ome in and see Dr. Wynetta Emery when she returns to discuss this further.

## 2022-01-09 NOTE — Addendum Note (Signed)
Addended by: Jon Billings on: 01/09/2022 11:13 AM   Modules accepted: Orders

## 2022-01-09 NOTE — Telephone Encounter (Signed)
Lmom asking pt to call back to schedule an appt with Dr. Wynetta Emery, first available

## 2022-01-10 NOTE — Telephone Encounter (Signed)
2nd attempt-Lmom asking pt to call back to schedule an appt. °

## 2022-01-11 NOTE — Telephone Encounter (Signed)
3rd attempt- Lmom asking pt to call back to schedule an appt. °

## 2022-01-26 ENCOUNTER — Other Ambulatory Visit: Payer: Self-pay | Admitting: Family Medicine

## 2022-01-26 NOTE — Telephone Encounter (Signed)
Medication Refill - Medication: QUEtiapine (SEROQUEL) 300 MG tablet  Pt says he is completely out and will end up in the ED again if he does not have it...  Has the patient contacted their pharmacy? No. (Agent: If no, request that the patient contact the pharmacy for the refill. If patient does not wish to contact the pharmacy document the reason why and proceed with request.) (Agent: If yes, when and what did the pharmacy advise?)  Preferred Pharmacy (with phone number or street name):  [id number] [place], [place] - [address]  [address] [place] [address]  Phone: [phone] Fax: [phone]   Has the patient been seen for an appointment in the last year OR does the patient have an upcoming appointment? Yes.  Agent: Please be advised that RX refills may take up to 3 business days. We ask that you follow-up with your pharmacy.

## 2022-01-26 NOTE — Telephone Encounter (Signed)
Requested medication (s) are due for refill today: Yes  Requested medication (s) are on the active medication list: Yes  Last refill:  10/21/21  Future visit scheduled: Yes  Notes to clinic:  Unable to refill per protocol, cannot delegate. Pt is requesting this urgently.    Requested Prescriptions  Pending Prescriptions Disp Refills   QUEtiapine (SEROQUEL) 300 MG tablet 90 tablet 1    Sig: TAKE 1 TABLET BY MOUTH EVERYDAY AT BEDTIME     Not Delegated - Psychiatry:  Antipsychotics - Second Generation (Atypical) - quetiapine Failed - 01/26/2022  5:37 PM      Failed - This refill cannot be delegated      Failed - Lipid Panel in normal range within the last 12 months    Cholesterol, Total  Date Value Ref Range Status  07/25/2021 180 100 - 199 mg/dL Final   LDL Chol Calc (NIH)  Date Value Ref Range Status  07/25/2021 121 (H) 0 - 99 mg/dL Final   HDL  Date Value Ref Range Status  07/25/2021 41 >39 mg/dL Final   Triglycerides  Date Value Ref Range Status  07/25/2021 100 0 - 149 mg/dL Final         Passed - TSH in normal range and within 360 days    TSH  Date Value Ref Range Status  07/25/2021 3.110 0.450 - 4.500 uIU/mL Final          Passed - Completed PHQ-2 or PHQ-9 in the last 360 days      Passed - Last BP in normal range    BP Readings from Last 1 Encounters:  10/21/21 132/88          Passed - Last Heart Rate in normal range    Pulse Readings from Last 1 Encounters:  10/21/21 96          Passed - Valid encounter within last 6 months    Recent Outpatient Visits           3 months ago Bipolar affective disorder, depressed, severe, with psychotic behavior (HCC)   [facility] [place], Corrie Dandy T, NP   5 months ago Routine general medical examination at a health care facility   [facility], [place] P, DO   6 months ago Closed nondisplaced fracture of medial cuneiform of left foot with delayed healing, subsequent encounter    [facility], [place], DO   1 year ago Peripheral edema   [facility] [place], [place], DO   1 year ago Lumbar radicular pain   [facility] [place], [place], DO       Future Appointments             In 2 months Johnson, Megan P, DO [facility], [facility]            Passed - CBC within normal limits and completed in the last 12 months    WBC  Date Value Ref Range Status  07/25/2021 7.6 3.4 - 10.8 x10E3/uL Final  05/21/2020 10.5 4.0 - 10.5 K/uL Final   RBC  Date Value Ref Range Status  07/25/2021 5.09 4.14 - 5.80 x10E6/uL Final  05/21/2020 5.12 4.22 - 5.81 MIL/uL Final   Hemoglobin  Date Value Ref Range Status  07/25/2021 15.1 13.0 - 17.7 g/dL Final   Hematocrit  Date Value Ref Range Status  07/25/2021 44.8 37.5 - 51.0 % Final   MCHC  Date Value Ref Range Status  07/25/2021 33.7 31.5 - 35.7 g/dL Final  05/21/2020 35.5 30.0 - 36.0 g/dL Final   [facility]  Date Value Ref Range Status  07/25/2021 29.7 26.6 - 33.0 pg Final  05/21/2020 31.1 26.0 - 34.0 pg Final   MCV  Date Value Ref Range Status  07/25/2021 88 79 - 97 fL Final  06/16/2014 93 80 - 100 fL Final   No results found for: PLTCOUNTKUC, LABPLAT, POCPLA RDW  Date Value Ref Range Status  07/25/2021 13.2 11.6 - 15.4 % Final  06/16/2014 13.0 11.5 - 14.5 % Final         Passed - CMP within normal limits and completed in the last 12 months    Albumin  Date Value Ref Range Status  07/25/2021 4.8 4.0 - 5.0 g/dL Final  06/16/2014 3.8 3.4 - 5.0 g/dL Final   Alkaline Phosphatase  Date Value Ref Range Status  07/25/2021 157 (H) 44 - 121 IU/L Final  06/16/2014 88 Unit/L Final    Comment:    45-117 NOTE: New Reference Range 11/14/13    ALT  Date Value Ref Range Status  07/25/2021 10 0 - 44 IU/L Final   SGPT (ALT)  Date Value Ref Range Status  06/16/2014 18 12 - 78 U/L Final   AST  Date Value Ref Range Status  07/25/2021 26 0 - 40 IU/L Final    SGOT(AST)  Date Value Ref Range Status  06/16/2014 16 15 - 37 Unit/L Final   BUN  Date Value Ref Range Status  07/25/2021 10 6 - 24 mg/dL Final  06/16/2014 8 7 - 18 mg/dL Final   Calcium  Date Value Ref Range Status  07/25/2021 9.3 8.7 - 10.2 mg/dL Final   Calcium, Total  Date Value Ref Range Status  06/16/2014 8.5 8.5 - 10.1 mg/dL Final   CO2  Date Value Ref Range Status  07/25/2021 21 20 - 29 mmol/L Final   Co2  Date Value Ref Range Status  06/16/2014 24 21 - 32 mmol/L Final   Creatinine  Date Value Ref Range Status  06/16/2014 0.87 0.60 - 1.30 mg/dL Final   Creatinine, Ser  Date Value Ref Range Status  07/25/2021 0.96 0.76 - 1.27 mg/dL Final   Glucose  Date Value Ref Range Status  07/25/2021 89 65 - 99 mg/dL Final  06/16/2014 119 (H) 65 - 99 mg/dL Final   Glucose, Bld  Date Value Ref Range Status  05/21/2020 92 70 - 99 mg/dL Final    Comment:    Glucose reference range applies only to samples taken after fasting for at least 8 hours.   Potassium  Date Value Ref Range Status  07/25/2021 CANCELED mmol/L     Comment:    Test not performed. Specimen is hemolyzed. Unable to obtain valid results.  Result canceled by the ancillary.   06/16/2014 3.7 3.5 - 5.1 mmol/L Final   Sodium  Date Value Ref Range Status  07/25/2021 138 134 - 144 mmol/L Final  06/16/2014 140 136 - 145 mmol/L Final   Bilirubin,Total  Date Value Ref Range Status  06/16/2014 0.6 0.2 - 1.0 mg/dL Final   Bilirubin Total  Date Value Ref Range Status  07/25/2021 0.3 0.0 - 1.2 mg/dL Final   Protein, ur  Date Value Ref Range Status  11/02/2019 NEGATIVE NEGATIVE mg/dL Final   Protein,UA  Date Value Ref Range Status  07/25/2021 Negative Negative/Trace Final   Total Protein  Date Value Ref Range Status  07/25/2021 7.7 6.0 - 8.5  g/dL Final  06/16/2014 6.7 6.4 - 8.2 g/dL Final   EGFR (African American)  Date Value Ref Range Status  06/16/2014 >60  Final   GFR calc Af Amer   Date Value Ref Range Status  06/02/2020 102 >59 mL/min/1.73 Final    Comment:    **[facility] currently reports eGFR in compliance with the current**   recommendations of the [employer]. [facility] will   update reporting as new guidelines are published from the NKF-ASN   Task force.    eGFR  Date Value Ref Range Status  07/25/2021 96 >59 mL/min/1.73 Final   EGFR (Non-African Amer.)  Date Value Ref Range Status  06/16/2014 >60  Final    Comment:    eGFR values <58mL/min/1.73 m2 may be an indication of chronic kidney disease (CKD). Calculated eGFR is useful in patients with stable renal function. The eGFR calculation will not be reliable in acutely ill patients when serum creatinine is changing rapidly. It is not useful in  patients on dialysis. The eGFR calculation may not be applicable to patients at the low and high extremes of body sizes, pregnant women, and vegetarians.    GFR calc non Af Amer  Date Value Ref Range Status  06/02/2020 88 >59 mL/min/1.73 Final

## 2022-01-26 NOTE — Telephone Encounter (Signed)
Pt called reporting that he needs QUEtiapine (SEROQUEL) 300 MG tablet States he needs this urgently  [id number] [place], [place] - [address]  [address] [place] [address]  Phone: [phone] Fax: [phone]

## 2022-01-27 MED ORDER — QUETIAPINE FUMARATE 300 MG PO TABS
ORAL_TABLET | ORAL | 0 refills | Status: DC
Start: 2022-01-27 — End: 2022-05-02

## 2022-01-27 NOTE — Addendum Note (Signed)
Addended by: Georgina Peer on: 01/27/2022 11:45 AM   Modules accepted: Orders

## 2022-01-27 NOTE — Telephone Encounter (Signed)
Not meant to be routed high priority.

## 2022-01-27 NOTE — Telephone Encounter (Signed)
Patient states he has been out of QUEtiapine (SEROQUEL) 300 MG tablet for 4 days he takes medication for depression and would like if PCP can send in request today. Patient would like a follow up call   [id number] [place], [place] - [address]  [address] [place] [address]  Phone: [phone] Fax: [phone]

## 2022-01-27 NOTE — Telephone Encounter (Signed)
Last RX was sent to a different pharmacy. Can we send remainder of RX to the requested pharmacy please?

## 2022-02-15 ENCOUNTER — Other Ambulatory Visit: Payer: Self-pay | Admitting: Nurse Practitioner

## 2022-02-16 NOTE — Telephone Encounter (Signed)
Requested medication (s) are due for refill today: No  Requested medication (s) are on the active medication list: yes    Last refill: 01/27/22  #90  0 refills  Future visit scheduled yes 04/21/22  Notes to clinic:Not delegated. Please review  Requested Prescriptions  Pending Prescriptions Disp Refills   QUEtiapine (SEROQUEL) 300 MG tablet [Pharmacy Med Name: QUETIAPINE FUMARATE 300 MG TAB] 90 tablet 1    Sig: TAKE 1 TABLET BY MOUTH EVERYDAY AT BEDTIME     Not Delegated - Psychiatry:  Antipsychotics - Second Generation (Atypical) - quetiapine Failed - 02/15/2022  1:57 PM      Failed - This refill cannot be delegated      Failed - Lipid Panel in normal range within the last 12 months    Cholesterol, Total  Date Value Ref Range Status  07/25/2021 180 100 - 199 mg/dL Final   LDL Chol Calc (NIH)  Date Value Ref Range Status  07/25/2021 121 (H) 0 - 99 mg/dL Final   HDL  Date Value Ref Range Status  07/25/2021 41 >39 mg/dL Final   Triglycerides  Date Value Ref Range Status  07/25/2021 100 0 - 149 mg/dL Final         Passed - TSH in normal range and within 360 days    TSH  Date Value Ref Range Status  07/25/2021 3.110 0.450 - 4.500 uIU/mL Final          Passed - Completed PHQ-2 or PHQ-9 in the last 360 days      Passed - Last BP in normal range    BP Readings from Last 1 Encounters:  10/21/21 132/88          Passed - Last Heart Rate in normal range    Pulse Readings from Last 1 Encounters:  10/21/21 96          Passed - Valid encounter within last 6 months    Recent Outpatient Visits           3 months ago Bipolar affective disorder, depressed, severe, with psychotic behavior ([place])   [place], Henrine Screws T, NP   6 months ago Routine general medical examination at a health care facility   [facility], [place] P, DO   6 months ago Closed nondisplaced fracture of medial cuneiform of left foot with delayed healing, subsequent  encounter   [place], [place], DO   1 year ago Peripheral edema   [facility] [place], [place], DO   1 year ago Lumbar radicular pain   [facility] [place], [place], DO       Future Appointments             In 2 months Johnson, Megan P, DO [place], [facility]            Passed - CBC within normal limits and completed in the last 12 months    WBC  Date Value Ref Range Status  07/25/2021 7.6 3.4 - 10.8 x10E3/uL Final  05/21/2020 10.5 4.0 - 10.5 K/uL Final   RBC  Date Value Ref Range Status  07/25/2021 5.09 4.14 - 5.80 x10E6/uL Final  05/21/2020 5.12 4.22 - 5.81 MIL/uL Final   Hemoglobin  Date Value Ref Range Status  07/25/2021 15.1 13.0 - 17.7 g/dL Final   Hematocrit  Date Value Ref Range Status  07/25/2021 44.8 37.5 - 51.0 % Final   MCHC  Date Value Ref  Range Status  07/25/2021 33.7 31.5 - 35.7 g/dL Final  05/21/2020 35.5 30.0 - 36.0 g/dL Final   [facility]  Date Value Ref Range Status  07/25/2021 29.7 26.6 - 33.0 pg Final  05/21/2020 31.1 26.0 - 34.0 pg Final   MCV  Date Value Ref Range Status  07/25/2021 88 79 - 97 fL Final  06/16/2014 93 80 - 100 fL Final   No results found for: PLTCOUNTKUC, LABPLAT, POCPLA RDW  Date Value Ref Range Status  07/25/2021 13.2 11.6 - 15.4 % Final  06/16/2014 13.0 11.5 - 14.5 % Final         Passed - CMP within normal limits and completed in the last 12 months    Albumin  Date Value Ref Range Status  07/25/2021 4.8 4.0 - 5.0 g/dL Final  06/16/2014 3.8 3.4 - 5.0 g/dL Final   Alkaline Phosphatase  Date Value Ref Range Status  07/25/2021 157 (H) 44 - 121 IU/L Final  06/16/2014 88 Unit/L Final    Comment:    45-117 NOTE: New Reference Range 11/14/13    ALT  Date Value Ref Range Status  07/25/2021 10 0 - 44 IU/L Final   SGPT (ALT)  Date Value Ref Range Status  06/16/2014 18 12 - 78 U/L Final   AST  Date Value Ref Range Status  07/25/2021 26 0 - 40 IU/L  Final   SGOT(AST)  Date Value Ref Range Status  06/16/2014 16 15 - 37 Unit/L Final   BUN  Date Value Ref Range Status  07/25/2021 10 6 - 24 mg/dL Final  06/16/2014 8 7 - 18 mg/dL Final   Calcium  Date Value Ref Range Status  07/25/2021 9.3 8.7 - 10.2 mg/dL Final   Calcium, Total  Date Value Ref Range Status  06/16/2014 8.5 8.5 - 10.1 mg/dL Final   CO2  Date Value Ref Range Status  07/25/2021 21 20 - 29 mmol/L Final   Co2  Date Value Ref Range Status  06/16/2014 24 21 - 32 mmol/L Final   Creatinine  Date Value Ref Range Status  06/16/2014 0.87 0.60 - 1.30 mg/dL Final   Creatinine, Ser  Date Value Ref Range Status  07/25/2021 0.96 0.76 - 1.27 mg/dL Final   Glucose  Date Value Ref Range Status  07/25/2021 89 65 - 99 mg/dL Final  06/16/2014 119 (H) 65 - 99 mg/dL Final   Glucose, Bld  Date Value Ref Range Status  05/21/2020 92 70 - 99 mg/dL Final    Comment:    Glucose reference range applies only to samples taken after fasting for at least 8 hours.   Potassium  Date Value Ref Range Status  07/25/2021 CANCELED mmol/L     Comment:    Test not performed. Specimen is hemolyzed. Unable to obtain valid results.  Result canceled by the ancillary.   06/16/2014 3.7 3.5 - 5.1 mmol/L Final   Sodium  Date Value Ref Range Status  07/25/2021 138 134 - 144 mmol/L Final  06/16/2014 140 136 - 145 mmol/L Final   Bilirubin,Total  Date Value Ref Range Status  06/16/2014 0.6 0.2 - 1.0 mg/dL Final   Bilirubin Total  Date Value Ref Range Status  07/25/2021 0.3 0.0 - 1.2 mg/dL Final   Protein, ur  Date Value Ref Range Status  11/02/2019 NEGATIVE NEGATIVE mg/dL Final   Protein,UA  Date Value Ref Range Status  07/25/2021 Negative Negative/Trace Final   Total Protein  Date Value Ref Range Status  07/25/2021 7.7  6.0 - 8.5 g/dL Final  06/16/2014 6.7 6.4 - 8.2 g/dL Final   EGFR (African American)  Date Value Ref Range Status  06/16/2014 >60  Final   GFR calc Af  Amer  Date Value Ref Range Status  06/02/2020 102 >59 mL/min/1.73 Final    Comment:    **[facility] currently reports eGFR in compliance with the current**   recommendations of the [employer]. [facility] will   update reporting as new guidelines are published from the NKF-ASN   Task force.    eGFR  Date Value Ref Range Status  07/25/2021 96 >59 mL/min/1.73 Final   EGFR (Non-African Amer.)  Date Value Ref Range Status  06/16/2014 >60  Final    Comment:    eGFR values <35m/min/1.73 m2 may be an indication of chronic kidney disease (CKD). Calculated eGFR is useful in patients with stable renal function. The eGFR calculation will not be reliable in acutely ill patients when serum creatinine is changing rapidly. It is not useful in  patients on dialysis. The eGFR calculation may not be applicable to patients at the low and high extremes of body sizes, pregnant women, and vegetarians.    GFR calc non Af Amer  Date Value Ref Range Status  06/02/2020 88 >59 mL/min/1.73 Final

## 2022-02-20 ENCOUNTER — Other Ambulatory Visit: Payer: Self-pay | Admitting: Family Medicine

## 2022-02-20 NOTE — Telephone Encounter (Signed)
Medication Refill - Medication: gabapentin (NEURONTIN) 600 MG tablet  Pt asked me to let PCP that he is still looking for new PCP . Pt stated Medicaid advised him he has to wait 15-90 days.   Has the patient contacted their pharmacy? Yes.   No, more refills.   (Agent: If yes, when and what did the pharmacy advise?)  Preferred Pharmacy (with phone number or street name):  [id number] [place], [place] - [address]  [address] [place] [address]  Phone: [phone] Fax: [phone]  Hours: Not open 24 hours   Has the patient been seen for an appointment in the last year OR does the patient have an upcoming appointment? Yes.    Agent: Please be advised that RX refills may take up to 3 business days. We ask that you follow-up with your pharmacy.

## 2022-02-21 ENCOUNTER — Other Ambulatory Visit: Payer: Self-pay | Admitting: Family Medicine

## 2022-02-21 MED ORDER — GABAPENTIN 600 MG PO TABS: 600.0000 mg | ORAL_TABLET | Freq: Three times a day (TID) | ORAL | 1 refills | Status: AC

## 2022-02-21 MED ORDER — GABAPENTIN 600 MG PO TABS: 600.0000 mg | ORAL_TABLET | Freq: Three times a day (TID) | ORAL | 1 refills | Status: DC

## 2022-02-21 NOTE — Telephone Encounter (Signed)
Pt calling to follow up on medication refill.  I reached out to the pharmacy for the pt directly and they stated they have not received anything.  Please advice.

## 2022-02-21 NOTE — Addendum Note (Signed)
Addended by: Matilde Sprang on: 02/21/2022 04:48 PM   Modules accepted: Orders

## 2022-02-21 NOTE — Telephone Encounter (Addendum)
Attempted to reorder medication electronically, it keeps reverting back to print. Routing to the office to print and fax, pharmacy is not receiving and patient has called x 3 about this. [facility] [place] [facility]. Called and spoke to [place], [place].

## 2022-02-21 NOTE — Telephone Encounter (Signed)
Pt called in stating he has still not been able to pick up this Rx from the pharmacy, [facility], please advise.

## 2022-02-21 NOTE — Telephone Encounter (Signed)
Requested Prescriptions  Pending Prescriptions Disp Refills   gabapentin (NEURONTIN) 600 MG tablet 90 tablet 1    Sig: Take 1 tablet (600 mg total) by mouth 3 (three) times daily.     Neurology: Anticonvulsants - gabapentin Passed - 02/21/2022 12:09 PM      Passed - Cr in normal range and within 360 days    Creatinine  Date Value Ref Range Status  06/16/2014 0.87 0.60 - 1.30 mg/dL Final   Creatinine, Ser  Date Value Ref Range Status  07/25/2021 0.96 0.76 - 1.27 mg/dL Final         Passed - Completed PHQ-2 or PHQ-9 in the last 360 days      Passed - Valid encounter within last 12 months    Recent Outpatient Visits          4 months ago Bipolar affective disorder, depressed, severe, with psychotic behavior ([place])   [place], Henrine Screws T, NP   6 months ago Routine general medical examination at a health care facility   [facility], [place] P, DO   7 months ago Closed nondisplaced fracture of medial cuneiform of left foot with delayed healing, subsequent encounter   [place], [place], DO   1 year ago Peripheral edema   [place], DO   1 year ago Lumbar radicular pain   [facility] Valerie Roys, DO      Future Appointments            In 1 month Johnson, Barb Merino, DO [employer], [facility]

## 2022-02-21 NOTE — Telephone Encounter (Signed)
Pt called again this afternoon saying he is completely out and needs rx sent to th pharmacy in [place]. CB# [phone]

## 2022-02-23 ENCOUNTER — Other Ambulatory Visit: Payer: Self-pay | Admitting: Nurse Practitioner

## 2022-02-23 NOTE — Telephone Encounter (Signed)
Requested medication (s) are due for refill today: yes ? ?Requested medication (s) are on the active medication list: yes ? ?Last refill:  01/27/22 #90 0 refills ? ?Future visit scheduled: yes in 1 month ? ?Notes to clinic:  comment: REQUEST FOR 90 DAYS PRESCRIPTION.    Do you want to refill for #90 ? ? ? ?  ?Requested Prescriptions  ?Pending Prescriptions Disp Refills  ? QUEtiapine (SEROQUEL) 300 MG tablet [Pharmacy Med Name: QUETIAPINE FUMARATE 300 MG TAB] 90 tablet 1  ?  Sig: TAKE 1 TABLET BY MOUTH EVERYDAY AT BEDTIME  ?  ? Not Delegated - Psychiatry:  Antipsychotics - Second Generation (Atypical) - quetiapine Failed - 02/23/2022  2:00 PM  ?  ?  Failed - This refill cannot be delegated  ?  ?  Failed - Lipid Panel in normal range within the last 12 months  ?  Cholesterol, Total  ?Date Value Ref Range Status  ?07/25/2021 180 100 - 199 mg/dL Final  ? ?LDL Chol Calc (NIH)  ?Date Value Ref Range Status  ?07/25/2021 121 (H) 0 - 99 mg/dL Final  ? ?HDL  ?Date Value Ref Range Status  ?07/25/2021 41 >39 mg/dL Final  ? ?Triglycerides  ?Date Value Ref Range Status  ?07/25/2021 100 0 - 149 mg/dL Final  ? ?  ?  ?  Passed - TSH in normal range and within 360 days  ?  TSH  ?Date Value Ref Range Status  ?07/25/2021 3.110 0.450 - 4.500 uIU/mL Final  ?  ?  ?  ?  Passed - Completed PHQ-2 or PHQ-9 in the last 360 days  ?  ?  Passed - Last BP in normal range  ?  BP Readings from Last 1 Encounters:  ?10/21/21 132/88  ?  ?  ?  ?  Passed - Last Heart Rate in normal range  ?  Pulse Readings from Last 1 Encounters:  ?10/21/21 96  ?  ?  ?  ?  Passed - Valid encounter within last 6 months  ?  Recent Outpatient Visits   ? ?      ? 4 months ago Bipolar affective disorder, depressed, severe, with psychotic behavior ([place])  ? [place], Henrine Screws T, NP  ? 6 months ago Routine general medical examination at a health care facility  ? [place], Megan P, DO  ? 7 months ago Closed nondisplaced fracture of medial  cuneiform of left foot with delayed healing, subsequent encounter  ? [place], Megan P, DO  ? 1 year ago Peripheral edema  ? [place], Megan P, DO  ? 1 year ago Lumbar radicular pain  ? [place], [place] P, DO  ? ?  ?  ?Future Appointments   ? ?        ? In 1 month Johnson, Megan P, DO [facility]  ? ?  ? ?  ?  ?  Passed - CBC within normal limits and completed in the last 12 months  ?  WBC  ?Date Value Ref Range Status  ?07/25/2021 7.6 3.4 - 10.8 x10E3/uL Final  ?05/21/2020 10.5 4.0 - 10.5 K/uL Final  ? ?RBC  ?Date Value Ref Range Status  ?07/25/2021 5.09 4.14 - 5.80 x10E6/uL Final  ?05/21/2020 5.12 4.22 - 5.81 MIL/uL Final  ? ?Hemoglobin  ?Date Value Ref Range Status  ?07/25/2021 15.1 13.0 - 17.7 g/dL Final  ? ?Hematocrit  ?Date Value Ref  Range Status  ?07/25/2021 44.8 37.5 - 51.0 % Final  ? ?MCHC  ?Date Value Ref Range Status  ?07/25/2021 33.7 31.5 - 35.7 g/dL Final  ?05/21/2020 35.5 30.0 - 36.0 g/dL Final  ? ?MCH  ?Date Value Ref Range Status  ?07/25/2021 29.7 26.6 - 33.0 pg Final  ?05/21/2020 31.1 26.0 - 34.0 pg Final  ? ?MCV  ?Date Value Ref Range Status  ?07/25/2021 88 79 - 97 fL Final  ?06/16/2014 93 80 - 100 fL Final  ? ?No results found for: PLTCOUNTKUC, LABPLAT, [place] ?RDW  ?Date Value Ref Range Status  ?07/25/2021 13.2 11.6 - 15.4 % Final  ?06/16/2014 13.0 11.5 - 14.5 % Final  ? ?  ?  ?  Passed - CMP within normal limits and completed in the last 12 months  ?  Albumin  ?Date Value Ref Range Status  ?07/25/2021 4.8 4.0 - 5.0 g/dL Final  ?06/16/2014 3.8 3.4 - 5.0 g/dL Final  ? ?Alkaline Phosphatase  ?Date Value Ref Range Status  ?07/25/2021 157 (H) 44 - 121 IU/L Final  ?06/16/2014 88 Unit/L Final  ?  Comment:  ?  45-117 ?NOTE: New Reference Range ?11/14/13 ?  ? ?ALT  ?Date Value Ref Range Status  ?07/25/2021 10 0 - 44 IU/L Final  ? ?SGPT (ALT)  ?Date Value Ref Range Status  ?06/16/2014 18 12 - 78 U/L Final  ? ?AST   ?Date Value Ref Range Status  ?07/25/2021 26 0 - 40 IU/L Final  ? ?SGOT(AST)  ?Date Value Ref Range Status  ?06/16/2014 16 15 - 37 Unit/L Final  ? ?BUN  ?Date Value Ref Range Status  ?07/25/2021 10 6 - 24 mg/dL Final  ?06/16/2014 8 7 - 18 mg/dL Final  ? ?Calcium  ?Date Value Ref Range Status  ?07/25/2021 9.3 8.7 - 10.2 mg/dL Final  ? ?Calcium, Total  ?Date Value Ref Range Status  ?06/16/2014 8.5 8.5 - 10.1 mg/dL Final  ? ?CO2  ?Date Value Ref Range Status  ?07/25/2021 21 20 - 29 mmol/L Final  ? ?Co2  ?Date Value Ref Range Status  ?06/16/2014 24 21 - 32 mmol/L Final  ? ?Creatinine  ?Date Value Ref Range Status  ?06/16/2014 0.87 0.60 - 1.30 mg/dL Final  ? ?Creatinine, Ser  ?Date Value Ref Range Status  ?07/25/2021 0.96 0.76 - 1.27 mg/dL Final  ? ?Glucose  ?Date Value Ref Range Status  ?07/25/2021 89 65 - 99 mg/dL Final  ?06/16/2014 119 (H) 65 - 99 mg/dL Final  ? ?Glucose, Bld  ?Date Value Ref Range Status  ?05/21/2020 92 70 - 99 mg/dL Final  ?  Comment:  ?  Glucose reference range applies only to samples taken after fasting for at least 8 hours.  ? ?Potassium  ?Date Value Ref Range Status  ?07/25/2021 CANCELED mmol/L   ?  Comment:  ?  Test not performed. Specimen is hemolyzed. Unable to obtain ?valid results. ? ?Result canceled by the ancillary. ?  ?06/16/2014 3.7 3.5 - 5.1 mmol/L Final  ? ?Sodium  ?Date Value Ref Range Status  ?07/25/2021 138 134 - 144 mmol/L Final  ?06/16/2014 140 136 - 145 mmol/L Final  ? ?Bilirubin,Total  ?Date Value Ref Range Status  ?06/16/2014 0.6 0.2 - 1.0 mg/dL Final  ? ?Bilirubin Total  ?Date Value Ref Range Status  ?07/25/2021 0.3 0.0 - 1.2 mg/dL Final  ? ?Protein, ur  ?Date Value Ref Range Status  ?11/02/2019 NEGATIVE NEGATIVE mg/dL Final  ? ?Protein,UA  ?Date Value Ref Range Status  ?  07/25/2021 Negative Negative/Trace Final  ? ?Total Protein  ?Date Value Ref Range Status  ?07/25/2021 7.7 6.0 - 8.5 g/dL Final  ?06/16/2014 6.7 6.4 - 8.2 g/dL Final  ? ?EGFR (African American)  ?Date Value  Ref Range Status  ?06/16/2014 >60  Final  ? ?GFR calc Af Wyvonnia Lora  ?Date Value Ref Range Status  ?06/02/2020 102 >59 mL/min/1.73 Final  ?  Comment:  ?  **[facility] currently reports eGFR in compliance with the current** ?  recommendations of the [employer]. [facility] will ?  update reporting as new guidelines are published from the NKF-ASN ?  Task force. ?  ? ?eGFR  ?Date Value Ref Range Status  ?07/25/2021 96 >59 mL/min/1.73 Final  ? ?EGFR (Non-African Amer.)  ?Date Value Ref Range Status  ?06/16/2014 >60  Final  ?  Comment:  ?  eGFR values <74mL/min/1.73 m2 may be an indication of chronic ?kidney disease (CKD). ?Calculated eGFR is useful in patients with stable renal function. ?The eGFR calculation will not be reliable in acutely ill patients ?when serum creatinine is changing rapidly. It is not useful in  ?patients on dialysis. The eGFR calculation may not be applicable ?to patients at the low and high extremes of body sizes, pregnant ?women, and vegetarians. ?  ? ?GFR calc non Af Amer  ?Date Value Ref Range Status  ?06/02/2020 88 >59 mL/min/1.73 Final  ? ?  ?  ?  ? ?

## 2022-03-04 ENCOUNTER — Other Ambulatory Visit: Payer: Self-pay | Admitting: Nurse Practitioner

## 2022-03-06 NOTE — Telephone Encounter (Signed)
Requested medications are due for refill today. no ? ?Requested medications are on the active medications list.  yes ? ?Last refill. 01/27/2022 #90 0 refills ? ?Future visit scheduled.   yes ? ?Notes to clinic.  Medication not delegated - pt should still have 30 day supply. ? ? ? ?Requested Prescriptions  ?Pending Prescriptions Disp Refills  ? QUEtiapine (SEROQUEL) 300 MG tablet [Pharmacy Med Name: QUETIAPINE FUMARATE 300 MG TAB] 90 tablet 1  ?  Sig: TAKE 1 TABLET BY MOUTH EVERYDAY AT BEDTIME  ?  ? Not Delegated - Psychiatry:  Antipsychotics - Second Generation (Atypical) - quetiapine Failed - 03/04/2022  1:34 PM  ?  ?  Failed - This refill cannot be delegated  ?  ?  Failed - Lipid Panel in normal range within the last 12 months  ?  Cholesterol, Total  ?Date Value Ref Range Status  ?07/25/2021 180 100 - 199 mg/dL Final  ? ?LDL Chol Calc (NIH)  ?Date Value Ref Range Status  ?07/25/2021 121 (H) 0 - 99 mg/dL Final  ? ?HDL  ?Date Value Ref Range Status  ?07/25/2021 41 >39 mg/dL Final  ? ?Triglycerides  ?Date Value Ref Range Status  ?07/25/2021 100 0 - 149 mg/dL Final  ? ?  ?  ?  Passed - TSH in normal range and within 360 days  ?  TSH  ?Date Value Ref Range Status  ?07/25/2021 3.110 0.450 - 4.500 uIU/mL Final  ?  ?  ?  ?  Passed - Completed PHQ-2 or PHQ-9 in the last 360 days  ?  ?  Passed - Last BP in normal range  ?  BP Readings from Last 1 Encounters:  ?10/21/21 132/88  ?  ?  ?  ?  Passed - Last Heart Rate in normal range  ?  Pulse Readings from Last 1 Encounters:  ?10/21/21 96  ?  ?  ?  ?  Passed - Valid encounter within last 6 months  ?  Recent Outpatient Visits   ? ?      ? 4 months ago Bipolar affective disorder, depressed, severe, with psychotic behavior ([place])  ? [place], Henrine Screws T, NP  ? 7 months ago Routine general medical examination at a health care facility  ? [place], Megan P, DO  ? 7 months ago Closed nondisplaced fracture of medial cuneiform of left foot with  delayed healing, subsequent encounter  ? [place], Megan P, DO  ? 1 year ago Peripheral edema  ? [place], Megan P, DO  ? 1 year ago Lumbar radicular pain  ? [place], [place] P, DO  ? ?  ?  ?Future Appointments   ? ?        ? In 1 month [place], Megan P, DO [facility]  ? ?  ? ?  ?  ?  Passed - CBC within normal limits and completed in the last 12 months  ?  WBC  ?Date Value Ref Range Status  ?07/25/2021 7.6 3.4 - 10.8 x10E3/uL Final  ?05/21/2020 10.5 4.0 - 10.5 K/uL Final  ? ?RBC  ?Date Value Ref Range Status  ?07/25/2021 5.09 4.14 - 5.80 x10E6/uL Final  ?05/21/2020 5.12 4.22 - 5.81 MIL/uL Final  ? ?Hemoglobin  ?Date Value Ref Range Status  ?07/25/2021 15.1 13.0 - 17.7 g/dL Final  ? ?Hematocrit  ?Date Value Ref Range Status  ?07/25/2021 44.8 37.5 - 51.0 % Final  ? ?  MCHC  ?Date Value Ref Range Status  ?07/25/2021 33.7 31.5 - 35.7 g/dL Final  ?05/21/2020 35.5 30.0 - 36.0 g/dL Final  ? ?MCH  ?Date Value Ref Range Status  ?07/25/2021 29.7 26.6 - 33.0 pg Final  ?05/21/2020 31.1 26.0 - 34.0 pg Final  ? ?MCV  ?Date Value Ref Range Status  ?07/25/2021 88 79 - 97 fL Final  ?06/16/2014 93 80 - 100 fL Final  ? ?No results found for: PLTCOUNTKUC, LABPLAT, [place] ?RDW  ?Date Value Ref Range Status  ?07/25/2021 13.2 11.6 - 15.4 % Final  ?06/16/2014 13.0 11.5 - 14.5 % Final  ? ?  ?  ?  Passed - CMP within normal limits and completed in the last 12 months  ?  Albumin  ?Date Value Ref Range Status  ?07/25/2021 4.8 4.0 - 5.0 g/dL Final  ?06/16/2014 3.8 3.4 - 5.0 g/dL Final  ? ?Alkaline Phosphatase  ?Date Value Ref Range Status  ?07/25/2021 157 (H) 44 - 121 IU/L Final  ?06/16/2014 88 Unit/L Final  ?  Comment:  ?  45-117 ?NOTE: New Reference Range ?11/14/13 ?  ? ?ALT  ?Date Value Ref Range Status  ?07/25/2021 10 0 - 44 IU/L Final  ? ?SGPT (ALT)  ?Date Value Ref Range Status  ?06/16/2014 18 12 - 78 U/L Final  ? ?AST  ?Date Value Ref Range Status   ?07/25/2021 26 0 - 40 IU/L Final  ? ?SGOT(AST)  ?Date Value Ref Range Status  ?06/16/2014 16 15 - 37 Unit/L Final  ? ?BUN  ?Date Value Ref Range Status  ?07/25/2021 10 6 - 24 mg/dL Final  ?06/16/2014 8 7 - 18 mg/dL Final  ? ?Calcium  ?Date Value Ref Range Status  ?07/25/2021 9.3 8.7 - 10.2 mg/dL Final  ? ?Calcium, Total  ?Date Value Ref Range Status  ?06/16/2014 8.5 8.5 - 10.1 mg/dL Final  ? ?CO2  ?Date Value Ref Range Status  ?07/25/2021 21 20 - 29 mmol/L Final  ? ?Co2  ?Date Value Ref Range Status  ?06/16/2014 24 21 - 32 mmol/L Final  ? ?Creatinine  ?Date Value Ref Range Status  ?06/16/2014 0.87 0.60 - 1.30 mg/dL Final  ? ?Creatinine, Ser  ?Date Value Ref Range Status  ?07/25/2021 0.96 0.76 - 1.27 mg/dL Final  ? ?Glucose  ?Date Value Ref Range Status  ?07/25/2021 89 65 - 99 mg/dL Final  ?06/16/2014 119 (H) 65 - 99 mg/dL Final  ? ?Glucose, Bld  ?Date Value Ref Range Status  ?05/21/2020 92 70 - 99 mg/dL Final  ?  Comment:  ?  Glucose reference range applies only to samples taken after fasting for at least 8 hours.  ? ?Potassium  ?Date Value Ref Range Status  ?07/25/2021 CANCELED mmol/L   ?  Comment:  ?  Test not performed. Specimen is hemolyzed. Unable to obtain ?valid results. ? ?Result canceled by the ancillary. ?  ?06/16/2014 3.7 3.5 - 5.1 mmol/L Final  ? ?Sodium  ?Date Value Ref Range Status  ?07/25/2021 138 134 - 144 mmol/L Final  ?06/16/2014 140 136 - 145 mmol/L Final  ? ?Bilirubin,Total  ?Date Value Ref Range Status  ?06/16/2014 0.6 0.2 - 1.0 mg/dL Final  ? ?Bilirubin Total  ?Date Value Ref Range Status  ?07/25/2021 0.3 0.0 - 1.2 mg/dL Final  ? ?Protein, ur  ?Date Value Ref Range Status  ?11/02/2019 NEGATIVE NEGATIVE mg/dL Final  ? ?Protein,UA  ?Date Value Ref Range Status  ?07/25/2021 Negative Negative/Trace Final  ? ?Total Protein  ?Date Value Ref  Range Status  ?07/25/2021 7.7 6.0 - 8.5 g/dL Final  ?06/16/2014 6.7 6.4 - 8.2 g/dL Final  ? ?EGFR (African American)  ?Date Value Ref Range Status  ?06/16/2014  >60  Final  ? ?GFR calc Af Wyvonnia Lora  ?Date Value Ref Range Status  ?06/02/2020 102 >59 mL/min/1.73 Final  ?  Comment:  ?  **[facility] currently reports eGFR in compliance with the current** ?  recommendations of the [employer]. [facility] will ?  update reporting as new guidelines are published from the NKF-ASN ?  Task force. ?  ? ?eGFR  ?Date Value Ref Range Status  ?07/25/2021 96 >59 mL/min/1.73 Final  ? ?EGFR (Non-African Amer.)  ?Date Value Ref Range Status  ?06/16/2014 >60  Final  ?  Comment:  ?  eGFR values <42mL/min/1.73 m2 may be an indication of chronic ?kidney disease (CKD). ?Calculated eGFR is useful in patients with stable renal function. ?The eGFR calculation will not be reliable in acutely ill patients ?when serum creatinine is changing rapidly. It is not useful in  ?patients on dialysis. The eGFR calculation may not be applicable ?to patients at the low and high extremes of body sizes, pregnant ?women, and vegetarians. ?  ? ?GFR calc non Af Amer  ?Date Value Ref Range Status  ?06/02/2020 88 >59 mL/min/1.73 Final  ? ?  ?  ?  ?  ?

## 2022-03-09 ENCOUNTER — Other Ambulatory Visit: Payer: Self-pay | Admitting: Nurse Practitioner

## 2022-03-10 NOTE — Telephone Encounter (Signed)
Requested medication (s) are due for refill today: pharmacy asking for changes ? ?Requested medication (s) are on the active medication list: yes ? ?Last refill:  01/27/22 #90 with 0 RF ? ?Future visit scheduled: 04/21/22 ? ?Notes to clinic:  This medication can not be delegated, please assess.  ? ? ? ?  ? ?Requested Prescriptions  ?Pending Prescriptions Disp Refills  ? QUEtiapine (SEROQUEL) 300 MG tablet [Pharmacy Med Name: QUETIAPINE FUMARATE 300 MG TAB] 90 tablet 1  ?  Sig: TAKE 1 TABLET BY MOUTH EVERYDAY AT BEDTIME  ?  ? Not Delegated - Psychiatry:  Antipsychotics - Second Generation (Atypical) - quetiapine Failed - 03/09/2022  6:11 PM  ?  ?  Failed - This refill cannot be delegated  ?  ?  Failed - Lipid Panel in normal range within the last 12 months  ?  Cholesterol, Total  ?Date Value Ref Range Status  ?07/25/2021 180 100 - 199 mg/dL Final  ? ?LDL Chol Calc (NIH)  ?Date Value Ref Range Status  ?07/25/2021 121 (H) 0 - 99 mg/dL Final  ? ?HDL  ?Date Value Ref Range Status  ?07/25/2021 41 >39 mg/dL Final  ? ?Triglycerides  ?Date Value Ref Range Status  ?07/25/2021 100 0 - 149 mg/dL Final  ? ?  ?  ?  Passed - TSH in normal range and within 360 days  ?  TSH  ?Date Value Ref Range Status  ?07/25/2021 3.110 0.450 - 4.500 uIU/mL Final  ?  ?  ?  ?  Passed - Completed PHQ-2 or PHQ-9 in the last 360 days  ?  ?  Passed - Last BP in normal range  ?  BP Readings from Last 1 Encounters:  ?10/21/21 132/88  ?  ?  ?  ?  Passed - Last Heart Rate in normal range  ?  Pulse Readings from Last 1 Encounters:  ?10/21/21 96  ?  ?  ?  ?  Passed - Valid encounter within last 6 months  ?  Recent Outpatient Visits   ? ?      ? 4 months ago Bipolar affective disorder, depressed, severe, with psychotic behavior ([place])  ? [place], Henrine Screws T, NP  ? 7 months ago Routine general medical examination at a health care facility  ? [place], Megan P, DO  ? 7 months ago Closed nondisplaced fracture of medial  cuneiform of left foot with delayed healing, subsequent encounter  ? [place], Megan P, DO  ? 1 year ago Peripheral edema  ? [place], Megan P, DO  ? 1 year ago Lumbar radicular pain  ? [place], [place] P, DO  ? ?  ?  ?Future Appointments   ? ?        ? In 1 month Johnson, Megan P, DO [facility]  ? ?  ? ?  ?  ?  Passed - CBC within normal limits and completed in the last 12 months  ?  WBC  ?Date Value Ref Range Status  ?07/25/2021 7.6 3.4 - 10.8 x10E3/uL Final  ?05/21/2020 10.5 4.0 - 10.5 K/uL Final  ? ?RBC  ?Date Value Ref Range Status  ?07/25/2021 5.09 4.14 - 5.80 x10E6/uL Final  ?05/21/2020 5.12 4.22 - 5.81 MIL/uL Final  ? ?Hemoglobin  ?Date Value Ref Range Status  ?07/25/2021 15.1 13.0 - 17.7 g/dL Final  ? ?Hematocrit  ?Date Value Ref Range Status  ?07/25/2021 44.8  37.5 - 51.0 % Final  ? ?MCHC  ?Date Value Ref Range Status  ?07/25/2021 33.7 31.5 - 35.7 g/dL Final  ?05/21/2020 35.5 30.0 - 36.0 g/dL Final  ? ?MCH  ?Date Value Ref Range Status  ?07/25/2021 29.7 26.6 - 33.0 pg Final  ?05/21/2020 31.1 26.0 - 34.0 pg Final  ? ?MCV  ?Date Value Ref Range Status  ?07/25/2021 88 79 - 97 fL Final  ?06/16/2014 93 80 - 100 fL Final  ? ?No results found for: PLTCOUNTKUC, LABPLAT, [place] ?RDW  ?Date Value Ref Range Status  ?07/25/2021 13.2 11.6 - 15.4 % Final  ?06/16/2014 13.0 11.5 - 14.5 % Final  ? ?  ?  ?  Passed - CMP within normal limits and completed in the last 12 months  ?  Albumin  ?Date Value Ref Range Status  ?07/25/2021 4.8 4.0 - 5.0 g/dL Final  ?06/16/2014 3.8 3.4 - 5.0 g/dL Final  ? ?Alkaline Phosphatase  ?Date Value Ref Range Status  ?07/25/2021 157 (H) 44 - 121 IU/L Final  ?06/16/2014 88 Unit/L Final  ?  Comment:  ?  45-117 ?NOTE: New Reference Range ?11/14/13 ?  ? ?ALT  ?Date Value Ref Range Status  ?07/25/2021 10 0 - 44 IU/L Final  ? ?SGPT (ALT)  ?Date Value Ref Range Status  ?06/16/2014 18 12 - 78 U/L Final  ? ?AST   ?Date Value Ref Range Status  ?07/25/2021 26 0 - 40 IU/L Final  ? ?SGOT(AST)  ?Date Value Ref Range Status  ?06/16/2014 16 15 - 37 Unit/L Final  ? ?BUN  ?Date Value Ref Range Status  ?07/25/2021 10 6 - 24 mg/dL Final  ?06/16/2014 8 7 - 18 mg/dL Final  ? ?Calcium  ?Date Value Ref Range Status  ?07/25/2021 9.3 8.7 - 10.2 mg/dL Final  ? ?Calcium, Total  ?Date Value Ref Range Status  ?06/16/2014 8.5 8.5 - 10.1 mg/dL Final  ? ?CO2  ?Date Value Ref Range Status  ?07/25/2021 21 20 - 29 mmol/L Final  ? ?Co2  ?Date Value Ref Range Status  ?06/16/2014 24 21 - 32 mmol/L Final  ? ?Creatinine  ?Date Value Ref Range Status  ?06/16/2014 0.87 0.60 - 1.30 mg/dL Final  ? ?Creatinine, Ser  ?Date Value Ref Range Status  ?07/25/2021 0.96 0.76 - 1.27 mg/dL Final  ? ?Glucose  ?Date Value Ref Range Status  ?07/25/2021 89 65 - 99 mg/dL Final  ?06/16/2014 119 (H) 65 - 99 mg/dL Final  ? ?Glucose, Bld  ?Date Value Ref Range Status  ?05/21/2020 92 70 - 99 mg/dL Final  ?  Comment:  ?  Glucose reference range applies only to samples taken after fasting for at least 8 hours.  ? ?Potassium  ?Date Value Ref Range Status  ?07/25/2021 CANCELED mmol/L   ?  Comment:  ?  Test not performed. Specimen is hemolyzed. Unable to obtain ?valid results. ? ?Result canceled by the ancillary. ?  ?06/16/2014 3.7 3.5 - 5.1 mmol/L Final  ? ?Sodium  ?Date Value Ref Range Status  ?07/25/2021 138 134 - 144 mmol/L Final  ?06/16/2014 140 136 - 145 mmol/L Final  ? ?Bilirubin,Total  ?Date Value Ref Range Status  ?06/16/2014 0.6 0.2 - 1.0 mg/dL Final  ? ?Bilirubin Total  ?Date Value Ref Range Status  ?07/25/2021 0.3 0.0 - 1.2 mg/dL Final  ? ?Protein, ur  ?Date Value Ref Range Status  ?11/02/2019 NEGATIVE NEGATIVE mg/dL Final  ? ?Protein,UA  ?Date Value Ref Range Status  ?07/25/2021 Negative Negative/Trace Final  ? ?  Total Protein  ?Date Value Ref Range Status  ?07/25/2021 7.7 6.0 - 8.5 g/dL Final  ?06/16/2014 6.7 6.4 - 8.2 g/dL Final  ? ?EGFR (African American)  ?Date Value  Ref Range Status  ?06/16/2014 >60  Final  ? ?GFR calc Af Wyvonnia Lora  ?Date Value Ref Range Status  ?06/02/2020 102 >59 mL/min/1.73 Final  ?  Comment:  ?  **[facility] currently reports eGFR in compliance with the current** ?  recommendations of the [employer]. [facility] will ?  update reporting as new guidelines are published from the NKF-ASN ?  Task force. ?  ? ?eGFR  ?Date Value Ref Range Status  ?07/25/2021 96 >59 mL/min/1.73 Final  ? ?EGFR (Non-African Amer.)  ?Date Value Ref Range Status  ?06/16/2014 >60  Final  ?  Comment:  ?  eGFR values <61mL/min/1.73 m2 may be an indication of chronic ?kidney disease (CKD). ?Calculated eGFR is useful in patients with stable renal function. ?The eGFR calculation will not be reliable in acutely ill patients ?when serum creatinine is changing rapidly. It is not useful in  ?patients on dialysis. The eGFR calculation may not be applicable ?to patients at the low and high extremes of body sizes, pregnant ?women, and vegetarians. ?  ? ?GFR calc non Af Amer  ?Date Value Ref Range Status  ?06/02/2020 88 >59 mL/min/1.73 Final  ? ?  ?  ?  ? ? ?

## 2022-03-11 ENCOUNTER — Other Ambulatory Visit: Payer: Self-pay | Admitting: Nurse Practitioner

## 2022-03-13 NOTE — Telephone Encounter (Signed)
Requested medications are due for refill today.  Too soon ? ?Requested medications are on the active medications list.  yes ? ?Last refill. 01/27/2022 #90 0 refills ? ?Future visit scheduled.   yes ? ?Notes to clinic.  Medication not delegated. ? ? ? ?Requested Prescriptions  ?Pending Prescriptions Disp Refills  ? QUEtiapine (SEROQUEL) 300 MG tablet [Pharmacy Med Name: QUETIAPINE FUMARATE 300 MG TAB] 90 tablet 1  ?  Sig: TAKE 1 TABLET BY MOUTH EVERYDAY AT BEDTIME  ?  ? Not Delegated - Psychiatry:  Antipsychotics - Second Generation (Atypical) - quetiapine Failed - 03/11/2022  1:38 PM  ?  ?  Failed - This refill cannot be delegated  ?  ?  Failed - Lipid Panel in normal range within the last 12 months  ?  Cholesterol, Total  ?Date Value Ref Range Status  ?07/25/2021 180 100 - 199 mg/dL Final  ? ?LDL Chol Calc (NIH)  ?Date Value Ref Range Status  ?07/25/2021 121 (H) 0 - 99 mg/dL Final  ? ?HDL  ?Date Value Ref Range Status  ?07/25/2021 41 >39 mg/dL Final  ? ?Triglycerides  ?Date Value Ref Range Status  ?07/25/2021 100 0 - 149 mg/dL Final  ? ?  ?  ?  Passed - TSH in normal range and within 360 days  ?  TSH  ?Date Value Ref Range Status  ?07/25/2021 3.110 0.450 - 4.500 uIU/mL Final  ?  ?  ?  ?  Passed - Completed PHQ-2 or PHQ-9 in the last 360 days  ?  ?  Passed - Last BP in normal range  ?  BP Readings from Last 1 Encounters:  ?10/21/21 132/88  ?  ?  ?  ?  Passed - Last Heart Rate in normal range  ?  Pulse Readings from Last 1 Encounters:  ?10/21/21 96  ?  ?  ?  ?  Passed - Valid encounter within last 6 months  ?  Recent Outpatient Visits   ? ?      ? 4 months ago Bipolar affective disorder, depressed, severe, with psychotic behavior ([place])  ? [place], Henrine Screws T, NP  ? 7 months ago Routine general medical examination at a health care facility  ? [place], Megan P, DO  ? 7 months ago Closed nondisplaced fracture of medial cuneiform of left foot with delayed healing, subsequent  encounter  ? [place], Megan P, DO  ? 1 year ago Peripheral edema  ? [place], Megan P, DO  ? 1 year ago Lumbar radicular pain  ? [place], [place] P, DO  ? ?  ?  ?Future Appointments   ? ?        ? In 1 month Johnson, Megan P, DO [facility]  ? ?  ? ?  ?  ?  Passed - CBC within normal limits and completed in the last 12 months  ?  WBC  ?Date Value Ref Range Status  ?07/25/2021 7.6 3.4 - 10.8 x10E3/uL Final  ?05/21/2020 10.5 4.0 - 10.5 K/uL Final  ? ?RBC  ?Date Value Ref Range Status  ?07/25/2021 5.09 4.14 - 5.80 x10E6/uL Final  ?05/21/2020 5.12 4.22 - 5.81 MIL/uL Final  ? ?Hemoglobin  ?Date Value Ref Range Status  ?07/25/2021 15.1 13.0 - 17.7 g/dL Final  ? ?Hematocrit  ?Date Value Ref Range Status  ?07/25/2021 44.8 37.5 - 51.0 % Final  ? ?MCHC  ?Date Value  Ref Range Status  ?07/25/2021 33.7 31.5 - 35.7 g/dL Final  ?05/21/2020 35.5 30.0 - 36.0 g/dL Final  ? ?MCH  ?Date Value Ref Range Status  ?07/25/2021 29.7 26.6 - 33.0 pg Final  ?05/21/2020 31.1 26.0 - 34.0 pg Final  ? ?MCV  ?Date Value Ref Range Status  ?07/25/2021 88 79 - 97 fL Final  ?06/16/2014 93 80 - 100 fL Final  ? ?No results found for: PLTCOUNTKUC, LABPLAT, [place] ?RDW  ?Date Value Ref Range Status  ?07/25/2021 13.2 11.6 - 15.4 % Final  ?06/16/2014 13.0 11.5 - 14.5 % Final  ? ?  ?  ?  Passed - CMP within normal limits and completed in the last 12 months  ?  Albumin  ?Date Value Ref Range Status  ?07/25/2021 4.8 4.0 - 5.0 g/dL Final  ?06/16/2014 3.8 3.4 - 5.0 g/dL Final  ? ?Alkaline Phosphatase  ?Date Value Ref Range Status  ?07/25/2021 157 (H) 44 - 121 IU/L Final  ?06/16/2014 88 Unit/L Final  ?  Comment:  ?  45-117 ?NOTE: New Reference Range ?11/14/13 ?  ? ?ALT  ?Date Value Ref Range Status  ?07/25/2021 10 0 - 44 IU/L Final  ? ?SGPT (ALT)  ?Date Value Ref Range Status  ?06/16/2014 18 12 - 78 U/L Final  ? ?AST  ?Date Value Ref Range Status  ?07/25/2021 26 0 - 40 IU/L  Final  ? ?SGOT(AST)  ?Date Value Ref Range Status  ?06/16/2014 16 15 - 37 Unit/L Final  ? ?BUN  ?Date Value Ref Range Status  ?07/25/2021 10 6 - 24 mg/dL Final  ?06/16/2014 8 7 - 18 mg/dL Final  ? ?Calcium  ?Date Value Ref Range Status  ?07/25/2021 9.3 8.7 - 10.2 mg/dL Final  ? ?Calcium, Total  ?Date Value Ref Range Status  ?06/16/2014 8.5 8.5 - 10.1 mg/dL Final  ? ?CO2  ?Date Value Ref Range Status  ?07/25/2021 21 20 - 29 mmol/L Final  ? ?Co2  ?Date Value Ref Range Status  ?06/16/2014 24 21 - 32 mmol/L Final  ? ?Creatinine  ?Date Value Ref Range Status  ?06/16/2014 0.87 0.60 - 1.30 mg/dL Final  ? ?Creatinine, Ser  ?Date Value Ref Range Status  ?07/25/2021 0.96 0.76 - 1.27 mg/dL Final  ? ?Glucose  ?Date Value Ref Range Status  ?07/25/2021 89 65 - 99 mg/dL Final  ?06/16/2014 119 (H) 65 - 99 mg/dL Final  ? ?Glucose, Bld  ?Date Value Ref Range Status  ?05/21/2020 92 70 - 99 mg/dL Final  ?  Comment:  ?  Glucose reference range applies only to samples taken after fasting for at least 8 hours.  ? ?Potassium  ?Date Value Ref Range Status  ?07/25/2021 CANCELED mmol/L   ?  Comment:  ?  Test not performed. Specimen is hemolyzed. Unable to obtain ?valid results. ? ?Result canceled by the ancillary. ?  ?06/16/2014 3.7 3.5 - 5.1 mmol/L Final  ? ?Sodium  ?Date Value Ref Range Status  ?07/25/2021 138 134 - 144 mmol/L Final  ?06/16/2014 140 136 - 145 mmol/L Final  ? ?Bilirubin,Total  ?Date Value Ref Range Status  ?06/16/2014 0.6 0.2 - 1.0 mg/dL Final  ? ?Bilirubin Total  ?Date Value Ref Range Status  ?07/25/2021 0.3 0.0 - 1.2 mg/dL Final  ? ?Protein, ur  ?Date Value Ref Range Status  ?11/02/2019 NEGATIVE NEGATIVE mg/dL Final  ? ?Protein,UA  ?Date Value Ref Range Status  ?07/25/2021 Negative Negative/Trace Final  ? ?Total Protein  ?Date Value Ref Range Status  ?07/25/2021  7.7 6.0 - 8.5 g/dL Final  ?06/16/2014 6.7 6.4 - 8.2 g/dL Final  ? ?EGFR (African American)  ?Date Value Ref Range Status  ?06/16/2014 >60  Final  ? ?GFR calc Af  Wyvonnia Lora  ?Date Value Ref Range Status  ?06/02/2020 102 >59 mL/min/1.73 Final  ?  Comment:  ?  **[facility] currently reports eGFR in compliance with the current** ?  recommendations of the [employer]. [facility] will ?  update reporting as new guidelines are published from the NKF-ASN ?  Task force. ?  ? ?eGFR  ?Date Value Ref Range Status  ?07/25/2021 96 >59 mL/min/1.73 Final  ? ?EGFR (Non-African Amer.)  ?Date Value Ref Range Status  ?06/16/2014 >60  Final  ?  Comment:  ?  eGFR values <49m/min/1.73 m2 may be an indication of chronic ?kidney disease (CKD). ?Calculated eGFR is useful in patients with stable renal function. ?The eGFR calculation will not be reliable in acutely ill patients ?when serum creatinine is changing rapidly. It is not useful in  ?patients on dialysis. The eGFR calculation may not be applicable ?to patients at the low and high extremes of body sizes, pregnant ?women, and vegetarians. ?  ? ?GFR calc non Af Amer  ?Date Value Ref Range Status  ?06/02/2020 88 >59 mL/min/1.73 Final  ? ?  ?  ?  ?  ?

## 2022-03-17 ENCOUNTER — Other Ambulatory Visit: Payer: Self-pay | Admitting: Nurse Practitioner

## 2022-03-20 NOTE — Telephone Encounter (Signed)
Requested medications are due for refill today.  No - called pt ? ?Requested medications are on the active medications list.  yes ? ?Last refill. 01/27/2022 #90 0 refills ? ?Future visit scheduled.   yes ? ?Notes to clinic.  Medication refill is not delegated. Refill not needed yet. ? ? ? ?Requested Prescriptions  ?Pending Prescriptions Disp Refills  ? QUEtiapine (SEROQUEL) 300 MG tablet [Pharmacy Med Name: QUETIAPINE FUMARATE 300 MG TAB] 90 tablet 1  ?  Sig: TAKE 1 TABLET BY MOUTH EVERYDAY AT BEDTIME  ?  ? Not Delegated - Psychiatry:  Antipsychotics - Second Generation (Atypical) - quetiapine Failed - 03/17/2022  7:10 PM  ?  ?  Failed - This refill cannot be delegated  ?  ?  Failed - Lipid Panel in normal range within the last 12 months  ?  Cholesterol, Total  ?Date Value Ref Range Status  ?07/25/2021 180 100 - 199 mg/dL Final  ? ?LDL Chol Calc (NIH)  ?Date Value Ref Range Status  ?07/25/2021 121 (H) 0 - 99 mg/dL Final  ? ?HDL  ?Date Value Ref Range Status  ?07/25/2021 41 >39 mg/dL Final  ? ?Triglycerides  ?Date Value Ref Range Status  ?07/25/2021 100 0 - 149 mg/dL Final  ? ?  ?  ?  Passed - TSH in normal range and within 360 days  ?  TSH  ?Date Value Ref Range Status  ?07/25/2021 3.110 0.450 - 4.500 uIU/mL Final  ?  ?  ?  ?  Passed - Completed PHQ-2 or PHQ-9 in the last 360 days  ?  ?  Passed - Last BP in normal range  ?  BP Readings from Last 1 Encounters:  ?10/21/21 132/88  ?  ?  ?  ?  Passed - Last Heart Rate in normal range  ?  Pulse Readings from Last 1 Encounters:  ?10/21/21 96  ?  ?  ?  ?  Passed - Valid encounter within last 6 months  ?  Recent Outpatient Visits   ? ?      ? 5 months ago Bipolar affective disorder, depressed, severe, with psychotic behavior ([place])  ? [place], Henrine Screws T, NP  ? 7 months ago Routine general medical examination at a health care facility  ? [place], Megan P, DO  ? 7 months ago Closed nondisplaced fracture of medial cuneiform of left  foot with delayed healing, subsequent encounter  ? [place], Megan P, DO  ? 1 year ago Peripheral edema  ? [place], Megan P, DO  ? 1 year ago Lumbar radicular pain  ? [place], [place] P, DO  ? ?  ?  ?Future Appointments   ? ?        ? In 1 month Johnson, Megan P, DO [facility]  ? ?  ? ?  ?  ?  Passed - CBC within normal limits and completed in the last 12 months  ?  WBC  ?Date Value Ref Range Status  ?07/25/2021 7.6 3.4 - 10.8 x10E3/uL Final  ?05/21/2020 10.5 4.0 - 10.5 K/uL Final  ? ?RBC  ?Date Value Ref Range Status  ?07/25/2021 5.09 4.14 - 5.80 x10E6/uL Final  ?05/21/2020 5.12 4.22 - 5.81 MIL/uL Final  ? ?Hemoglobin  ?Date Value Ref Range Status  ?07/25/2021 15.1 13.0 - 17.7 g/dL Final  ? ?Hematocrit  ?Date Value Ref Range Status  ?07/25/2021 44.8 37.5 - 51.0 %  Final  ? ?MCHC  ?Date Value Ref Range Status  ?07/25/2021 33.7 31.5 - 35.7 g/dL Final  ?05/21/2020 35.5 30.0 - 36.0 g/dL Final  ? ?MCH  ?Date Value Ref Range Status  ?07/25/2021 29.7 26.6 - 33.0 pg Final  ?05/21/2020 31.1 26.0 - 34.0 pg Final  ? ?MCV  ?Date Value Ref Range Status  ?07/25/2021 88 79 - 97 fL Final  ?06/16/2014 93 80 - 100 fL Final  ? ?No results found for: PLTCOUNTKUC, LABPLAT, [place] ?RDW  ?Date Value Ref Range Status  ?07/25/2021 13.2 11.6 - 15.4 % Final  ?06/16/2014 13.0 11.5 - 14.5 % Final  ? ?  ?  ?  Passed - CMP within normal limits and completed in the last 12 months  ?  Albumin  ?Date Value Ref Range Status  ?07/25/2021 4.8 4.0 - 5.0 g/dL Final  ?06/16/2014 3.8 3.4 - 5.0 g/dL Final  ? ?Alkaline Phosphatase  ?Date Value Ref Range Status  ?07/25/2021 157 (H) 44 - 121 IU/L Final  ?06/16/2014 88 Unit/L Final  ?  Comment:  ?  45-117 ?NOTE: New Reference Range ?11/14/13 ?  ? ?ALT  ?Date Value Ref Range Status  ?07/25/2021 10 0 - 44 IU/L Final  ? ?SGPT (ALT)  ?Date Value Ref Range Status  ?06/16/2014 18 12 - 78 U/L Final  ? ?AST  ?Date Value Ref Range  Status  ?07/25/2021 26 0 - 40 IU/L Final  ? ?SGOT(AST)  ?Date Value Ref Range Status  ?06/16/2014 16 15 - 37 Unit/L Final  ? ?BUN  ?Date Value Ref Range Status  ?07/25/2021 10 6 - 24 mg/dL Final  ?06/16/2014 8 7 - 18 mg/dL Final  ? ?Calcium  ?Date Value Ref Range Status  ?07/25/2021 9.3 8.7 - 10.2 mg/dL Final  ? ?Calcium, Total  ?Date Value Ref Range Status  ?06/16/2014 8.5 8.5 - 10.1 mg/dL Final  ? ?CO2  ?Date Value Ref Range Status  ?07/25/2021 21 20 - 29 mmol/L Final  ? ?Co2  ?Date Value Ref Range Status  ?06/16/2014 24 21 - 32 mmol/L Final  ? ?Creatinine  ?Date Value Ref Range Status  ?06/16/2014 0.87 0.60 - 1.30 mg/dL Final  ? ?Creatinine, Ser  ?Date Value Ref Range Status  ?07/25/2021 0.96 0.76 - 1.27 mg/dL Final  ? ?Glucose  ?Date Value Ref Range Status  ?07/25/2021 89 65 - 99 mg/dL Final  ?06/16/2014 119 (H) 65 - 99 mg/dL Final  ? ?Glucose, Bld  ?Date Value Ref Range Status  ?05/21/2020 92 70 - 99 mg/dL Final  ?  Comment:  ?  Glucose reference range applies only to samples taken after fasting for at least 8 hours.  ? ?Potassium  ?Date Value Ref Range Status  ?07/25/2021 CANCELED mmol/L   ?  Comment:  ?  Test not performed. Specimen is hemolyzed. Unable to obtain ?valid results. ? ?Result canceled by the ancillary. ?  ?06/16/2014 3.7 3.5 - 5.1 mmol/L Final  ? ?Sodium  ?Date Value Ref Range Status  ?07/25/2021 138 134 - 144 mmol/L Final  ?06/16/2014 140 136 - 145 mmol/L Final  ? ?Bilirubin,Total  ?Date Value Ref Range Status  ?06/16/2014 0.6 0.2 - 1.0 mg/dL Final  ? ?Bilirubin Total  ?Date Value Ref Range Status  ?07/25/2021 0.3 0.0 - 1.2 mg/dL Final  ? ?Protein, ur  ?Date Value Ref Range Status  ?11/02/2019 NEGATIVE NEGATIVE mg/dL Final  ? ?Protein,UA  ?Date Value Ref Range Status  ?07/25/2021 Negative Negative/Trace Final  ? ?Total Protein  ?  Date Value Ref Range Status  ?07/25/2021 7.7 6.0 - 8.5 g/dL Final  ?06/16/2014 6.7 6.4 - 8.2 g/dL Final  ? ?EGFR (African American)  ?Date Value Ref Range Status   ?06/16/2014 >60  Final  ? ?GFR calc Af Wyvonnia Lora  ?Date Value Ref Range Status  ?06/02/2020 102 >59 mL/min/1.73 Final  ?  Comment:  ?  **[facility] currently reports eGFR in compliance with the current** ?  recommendations of the [employer]. [facility] will ?  update reporting as new guidelines are published from the NKF-ASN ?  Task force. ?  ? ?eGFR  ?Date Value Ref Range Status  ?07/25/2021 96 >59 mL/min/1.73 Final  ? ?EGFR (Non-African Amer.)  ?Date Value Ref Range Status  ?06/16/2014 >60  Final  ?  Comment:  ?  eGFR values <74m/min/1.73 m2 may be an indication of chronic ?kidney disease (CKD). ?Calculated eGFR is useful in patients with stable renal function. ?The eGFR calculation will not be reliable in acutely ill patients ?when serum creatinine is changing rapidly. It is not useful in  ?patients on dialysis. The eGFR calculation may not be applicable ?to patients at the low and high extremes of body sizes, pregnant ?women, and vegetarians. ?  ? ?GFR calc non Af Amer  ?Date Value Ref Range Status  ?06/02/2020 88 >59 mL/min/1.73 Final  ? ?  ?  ?  ?  ?

## 2022-03-20 NOTE — Telephone Encounter (Signed)
Called pt - Pt has enough of this medication. Refill not needed at this time. ?

## 2022-03-24 ENCOUNTER — Other Ambulatory Visit: Payer: Self-pay | Admitting: Nurse Practitioner

## 2022-03-27 NOTE — Telephone Encounter (Signed)
Requested medication (s) are due for refill today:   Provider to review ? ?Requested medication (s) are on the active medication list:   Yes ? ?Future visit scheduled:   Yes ? ? ?Last ordered: 01/27/2022 #90, 0 refills ? ?Non delegated refill reason returned  ? ?Requested Prescriptions  ?Pending Prescriptions Disp Refills  ? QUEtiapine (SEROQUEL) 300 MG tablet [Pharmacy Med Name: QUETIAPINE FUMARATE 300 MG TAB] 90 tablet 1  ?  Sig: TAKE 1 TABLET BY MOUTH EVERYDAY AT BEDTIME  ?  ? Not Delegated - Psychiatry:  Antipsychotics - Second Generation (Atypical) - quetiapine Failed - 03/24/2022  6:39 PM  ?  ?  Failed - This refill cannot be delegated  ?  ?  Failed - Lipid Panel in normal range within the last 12 months  ?  Cholesterol, Total  ?Date Value Ref Range Status  ?07/25/2021 180 100 - 199 mg/dL Final  ? ?LDL Chol Calc (NIH)  ?Date Value Ref Range Status  ?07/25/2021 121 (H) 0 - 99 mg/dL Final  ? ?HDL  ?Date Value Ref Range Status  ?07/25/2021 41 >39 mg/dL Final  ? ?Triglycerides  ?Date Value Ref Range Status  ?07/25/2021 100 0 - 149 mg/dL Final  ? ?  ?  ?  Passed - TSH in normal range and within 360 days  ?  TSH  ?Date Value Ref Range Status  ?07/25/2021 3.110 0.450 - 4.500 uIU/mL Final  ?  ?  ?  ?  Passed - Completed PHQ-2 or PHQ-9 in the last 360 days  ?  ?  Passed - Last BP in normal range  ?  BP Readings from Last 1 Encounters:  ?10/21/21 132/88  ?  ?  ?  ?  Passed - Last Heart Rate in normal range  ?  Pulse Readings from Last 1 Encounters:  ?10/21/21 96  ?  ?  ?  ?  Passed - Valid encounter within last 6 months  ?  Recent Outpatient Visits   ? ?      ? 5 months ago Bipolar affective disorder, depressed, severe, with psychotic behavior ([place])  ? [place], Henrine Screws T, NP  ? 7 months ago Routine general medical examination at a health care facility  ? [place], Megan P, DO  ? 8 months ago Closed nondisplaced fracture of medial cuneiform of left foot with delayed healing,  subsequent encounter  ? [place], Megan P, DO  ? 1 year ago Peripheral edema  ? [place], Megan P, DO  ? 1 year ago Lumbar radicular pain  ? [place], [place] P, DO  ? ?  ?  ?Future Appointments   ? ?        ? In 3 weeks Wynetta Emery, Megan P, DO [facility]  ? ?  ? ?  ?  ?  Passed - CBC within normal limits and completed in the last 12 months  ?  WBC  ?Date Value Ref Range Status  ?07/25/2021 7.6 3.4 - 10.8 x10E3/uL Final  ?05/21/2020 10.5 4.0 - 10.5 K/uL Final  ? ?RBC  ?Date Value Ref Range Status  ?07/25/2021 5.09 4.14 - 5.80 x10E6/uL Final  ?05/21/2020 5.12 4.22 - 5.81 MIL/uL Final  ? ?Hemoglobin  ?Date Value Ref Range Status  ?07/25/2021 15.1 13.0 - 17.7 g/dL Final  ? ?Hematocrit  ?Date Value Ref Range Status  ?07/25/2021 44.8 37.5 - 51.0 % Final  ? ?MCHC  ?  Date Value Ref Range Status  ?07/25/2021 33.7 31.5 - 35.7 g/dL Final  ?05/21/2020 35.5 30.0 - 36.0 g/dL Final  ? ?MCH  ?Date Value Ref Range Status  ?07/25/2021 29.7 26.6 - 33.0 pg Final  ?05/21/2020 31.1 26.0 - 34.0 pg Final  ? ?MCV  ?Date Value Ref Range Status  ?07/25/2021 88 79 - 97 fL Final  ?06/16/2014 93 80 - 100 fL Final  ? ?No results found for: PLTCOUNTKUC, LABPLAT, [place] ?RDW  ?Date Value Ref Range Status  ?07/25/2021 13.2 11.6 - 15.4 % Final  ?06/16/2014 13.0 11.5 - 14.5 % Final  ? ?  ?  ?  Passed - CMP within normal limits and completed in the last 12 months  ?  Albumin  ?Date Value Ref Range Status  ?07/25/2021 4.8 4.0 - 5.0 g/dL Final  ?06/16/2014 3.8 3.4 - 5.0 g/dL Final  ? ?Alkaline Phosphatase  ?Date Value Ref Range Status  ?07/25/2021 157 (H) 44 - 121 IU/L Final  ?06/16/2014 88 Unit/L Final  ?  Comment:  ?  45-117 ?NOTE: New Reference Range ?11/14/13 ?  ? ?ALT  ?Date Value Ref Range Status  ?07/25/2021 10 0 - 44 IU/L Final  ? ?SGPT (ALT)  ?Date Value Ref Range Status  ?06/16/2014 18 12 - 78 U/L Final  ? ?AST  ?Date Value Ref Range Status  ?07/25/2021 26 0 -  40 IU/L Final  ? ?SGOT(AST)  ?Date Value Ref Range Status  ?06/16/2014 16 15 - 37 Unit/L Final  ? ?BUN  ?Date Value Ref Range Status  ?07/25/2021 10 6 - 24 mg/dL Final  ?06/16/2014 8 7 - 18 mg/dL Final  ? ?Calcium  ?Date Value Ref Range Status  ?07/25/2021 9.3 8.7 - 10.2 mg/dL Final  ? ?Calcium, Total  ?Date Value Ref Range Status  ?06/16/2014 8.5 8.5 - 10.1 mg/dL Final  ? ?CO2  ?Date Value Ref Range Status  ?07/25/2021 21 20 - 29 mmol/L Final  ? ?Co2  ?Date Value Ref Range Status  ?06/16/2014 24 21 - 32 mmol/L Final  ? ?Creatinine  ?Date Value Ref Range Status  ?06/16/2014 0.87 0.60 - 1.30 mg/dL Final  ? ?Creatinine, Ser  ?Date Value Ref Range Status  ?07/25/2021 0.96 0.76 - 1.27 mg/dL Final  ? ?Glucose  ?Date Value Ref Range Status  ?07/25/2021 89 65 - 99 mg/dL Final  ?06/16/2014 119 (H) 65 - 99 mg/dL Final  ? ?Glucose, Bld  ?Date Value Ref Range Status  ?05/21/2020 92 70 - 99 mg/dL Final  ?  Comment:  ?  Glucose reference range applies only to samples taken after fasting for at least 8 hours.  ? ?Potassium  ?Date Value Ref Range Status  ?07/25/2021 CANCELED mmol/L   ?  Comment:  ?  Test not performed. Specimen is hemolyzed. Unable to obtain ?valid results. ? ?Result canceled by the ancillary. ?  ?06/16/2014 3.7 3.5 - 5.1 mmol/L Final  ? ?Sodium  ?Date Value Ref Range Status  ?07/25/2021 138 134 - 144 mmol/L Final  ?06/16/2014 140 136 - 145 mmol/L Final  ? ?Bilirubin,Total  ?Date Value Ref Range Status  ?06/16/2014 0.6 0.2 - 1.0 mg/dL Final  ? ?Bilirubin Total  ?Date Value Ref Range Status  ?07/25/2021 0.3 0.0 - 1.2 mg/dL Final  ? ?Protein, ur  ?Date Value Ref Range Status  ?11/02/2019 NEGATIVE NEGATIVE mg/dL Final  ? ?Protein,UA  ?Date Value Ref Range Status  ?07/25/2021 Negative Negative/Trace Final  ? ?Total Protein  ?Date Value Ref Range Status  ?  07/25/2021 7.7 6.0 - 8.5 g/dL Final  ?06/16/2014 6.7 6.4 - 8.2 g/dL Final  ? ?EGFR (African American)  ?Date Value Ref Range Status  ?06/16/2014 >60  Final  ? ?GFR  calc Af Wyvonnia Lora  ?Date Value Ref Range Status  ?06/02/2020 102 >59 mL/min/1.73 Final  ?  Comment:  ?  **[facility] currently reports eGFR in compliance with the current** ?  recommendations of the [employer]. [facility] will ?  update reporting as new guidelines are published from the NKF-ASN ?  Task force. ?  ? ?eGFR  ?Date Value Ref Range Status  ?07/25/2021 96 >59 mL/min/1.73 Final  ? ?EGFR (Non-African Amer.)  ?Date Value Ref Range Status  ?06/16/2014 >60  Final  ?  Comment:  ?  eGFR values <68m/min/1.73 m2 may be an indication of chronic ?kidney disease (CKD). ?Calculated eGFR is useful in patients with stable renal function. ?The eGFR calculation will not be reliable in acutely ill patients ?when serum creatinine is changing rapidly. It is not useful in  ?patients on dialysis. The eGFR calculation may not be applicable ?to patients at the low and high extremes of body sizes, pregnant ?women, and vegetarians. ?  ? ?GFR calc non Af Amer  ?Date Value Ref Range Status  ?06/02/2020 88 >59 mL/min/1.73 Final  ? ?  ?  ?  ? ?

## 2022-03-31 ENCOUNTER — Other Ambulatory Visit: Payer: Self-pay | Admitting: Nurse Practitioner

## 2022-04-03 NOTE — Telephone Encounter (Signed)
Requested medication (s) are due for refill today - no ? ?Requested medication (s) are on the active medication list -yes ? ?Future visit scheduled -yes ? ?Last refill: 01/27/22 #90 ? ?Notes to clinic: Request RF: non delegated Rx ? ?Requested Prescriptions  ?Pending Prescriptions Disp Refills  ? QUEtiapine (SEROQUEL) 300 MG tablet [Pharmacy Med Name: QUETIAPINE FUMARATE 300 MG TAB] 90 tablet 1  ?  Sig: TAKE 1 TABLET BY MOUTH EVERYDAY AT BEDTIME  ?  ? Not Delegated - Psychiatry:  Antipsychotics - Second Generation (Atypical) - quetiapine Failed - 03/31/2022  6:09 PM  ?  ?  Failed - This refill cannot be delegated  ?  ?  Failed - Lipid Panel in normal range within the last 12 months  ?  Cholesterol, Total  ?Date Value Ref Range Status  ?07/25/2021 180 100 - 199 mg/dL Final  ? ?LDL Chol Calc (NIH)  ?Date Value Ref Range Status  ?07/25/2021 121 (H) 0 - 99 mg/dL Final  ? ?HDL  ?Date Value Ref Range Status  ?07/25/2021 41 >39 mg/dL Final  ? ?Triglycerides  ?Date Value Ref Range Status  ?07/25/2021 100 0 - 149 mg/dL Final  ? ?  ?  ?  Passed - TSH in normal range and within 360 days  ?  TSH  ?Date Value Ref Range Status  ?07/25/2021 3.110 0.450 - 4.500 uIU/mL Final  ?  ?  ?  ?  Passed - Completed PHQ-2 or PHQ-9 in the last 360 days  ?  ?  Passed - Last BP in normal range  ?  BP Readings from Last 1 Encounters:  ?10/21/21 132/88  ?  ?  ?  ?  Passed - Last Heart Rate in normal range  ?  Pulse Readings from Last 1 Encounters:  ?10/21/21 96  ?  ?  ?  ?  Passed - Valid encounter within last 6 months  ?  Recent Outpatient Visits   ? ?      ? 5 months ago Bipolar affective disorder, depressed, severe, with psychotic behavior ([place])  ? [place], Henrine Screws T, NP  ? 8 months ago Routine general medical examination at a health care facility  ? [place], Megan P, DO  ? 8 months ago Closed nondisplaced fracture of medial cuneiform of left foot with delayed healing, subsequent encounter  ?  [place], Megan P, DO  ? 1 year ago Peripheral edema  ? [place], Megan P, DO  ? 1 year ago Lumbar radicular pain  ? [place], [place] P, DO  ? ?  ?  ?Future Appointments   ? ?        ? In 2 weeks Wynetta Emery, Megan P, DO [place]  ? ?  ? ?  ?  ?  Passed - CBC within normal limits and completed in the last 12 months  ?  WBC  ?Date Value Ref Range Status  ?07/25/2021 7.6 3.4 - 10.8 x10E3/uL Final  ?05/21/2020 10.5 4.0 - 10.5 K/uL Final  ? ?RBC  ?Date Value Ref Range Status  ?07/25/2021 5.09 4.14 - 5.80 x10E6/uL Final  ?05/21/2020 5.12 4.22 - 5.81 MIL/uL Final  ? ?Hemoglobin  ?Date Value Ref Range Status  ?07/25/2021 15.1 13.0 - 17.7 g/dL Final  ? ?Hematocrit  ?Date Value Ref Range Status  ?07/25/2021 44.8 37.5 - 51.0 % Final  ? ?MCHC  ?Date Value Ref Range Status  ?07/25/2021  33.7 31.5 - 35.7 g/dL Final  ?05/21/2020 35.5 30.0 - 36.0 g/dL Final  ? ?MCH  ?Date Value Ref Range Status  ?07/25/2021 29.7 26.6 - 33.0 pg Final  ?05/21/2020 31.1 26.0 - 34.0 pg Final  ? ?MCV  ?Date Value Ref Range Status  ?07/25/2021 88 79 - 97 fL Final  ?06/16/2014 93 80 - 100 fL Final  ? ?No results found for: PLTCOUNTKUC, LABPLAT, [place] ?RDW  ?Date Value Ref Range Status  ?07/25/2021 13.2 11.6 - 15.4 % Final  ?06/16/2014 13.0 11.5 - 14.5 % Final  ? ?  ?  ?  Passed - CMP within normal limits and completed in the last 12 months  ?  Albumin  ?Date Value Ref Range Status  ?07/25/2021 4.8 4.0 - 5.0 g/dL Final  ?06/16/2014 3.8 3.4 - 5.0 g/dL Final  ? ?Alkaline Phosphatase  ?Date Value Ref Range Status  ?07/25/2021 157 (H) 44 - 121 IU/L Final  ?06/16/2014 88 Unit/L Final  ?  Comment:  ?  45-117 ?NOTE: New Reference Range ?11/14/13 ?  ? ?ALT  ?Date Value Ref Range Status  ?07/25/2021 10 0 - 44 IU/L Final  ? ?SGPT (ALT)  ?Date Value Ref Range Status  ?06/16/2014 18 12 - 78 U/L Final  ? ?AST  ?Date Value Ref Range Status  ?07/25/2021 26 0 - 40 IU/L Final   ? ?SGOT(AST)  ?Date Value Ref Range Status  ?06/16/2014 16 15 - 37 Unit/L Final  ? ?BUN  ?Date Value Ref Range Status  ?07/25/2021 10 6 - 24 mg/dL Final  ?06/16/2014 8 7 - 18 mg/dL Final  ? ?Calcium  ?Date Value Ref Range Status  ?07/25/2021 9.3 8.7 - 10.2 mg/dL Final  ? ?Calcium, Total  ?Date Value Ref Range Status  ?06/16/2014 8.5 8.5 - 10.1 mg/dL Final  ? ?CO2  ?Date Value Ref Range Status  ?07/25/2021 21 20 - 29 mmol/L Final  ? ?Co2  ?Date Value Ref Range Status  ?06/16/2014 24 21 - 32 mmol/L Final  ? ?Creatinine  ?Date Value Ref Range Status  ?06/16/2014 0.87 0.60 - 1.30 mg/dL Final  ? ?Creatinine, Ser  ?Date Value Ref Range Status  ?07/25/2021 0.96 0.76 - 1.27 mg/dL Final  ? ?Glucose  ?Date Value Ref Range Status  ?07/25/2021 89 65 - 99 mg/dL Final  ?06/16/2014 119 (H) 65 - 99 mg/dL Final  ? ?Glucose, Bld  ?Date Value Ref Range Status  ?05/21/2020 92 70 - 99 mg/dL Final  ?  Comment:  ?  Glucose reference range applies only to samples taken after fasting for at least 8 hours.  ? ?Potassium  ?Date Value Ref Range Status  ?07/25/2021 CANCELED mmol/L   ?  Comment:  ?  Test not performed. Specimen is hemolyzed. Unable to obtain ?valid results. ? ?Result canceled by the ancillary. ?  ?06/16/2014 3.7 3.5 - 5.1 mmol/L Final  ? ?Sodium  ?Date Value Ref Range Status  ?07/25/2021 138 134 - 144 mmol/L Final  ?06/16/2014 140 136 - 145 mmol/L Final  ? ?Bilirubin,Total  ?Date Value Ref Range Status  ?06/16/2014 0.6 0.2 - 1.0 mg/dL Final  ? ?Bilirubin Total  ?Date Value Ref Range Status  ?07/25/2021 0.3 0.0 - 1.2 mg/dL Final  ? ?Protein, ur  ?Date Value Ref Range Status  ?11/02/2019 NEGATIVE NEGATIVE mg/dL Final  ? ?Protein,UA  ?Date Value Ref Range Status  ?07/25/2021 Negative Negative/Trace Final  ? ?Total Protein  ?Date Value Ref Range Status  ?07/25/2021 7.7 6.0 - 8.5 g/dL  Final  ?06/16/2014 6.7 6.4 - 8.2 g/dL Final  ? ?EGFR (African American)  ?Date Value Ref Range Status  ?06/16/2014 >60  Final  ? ?GFR calc Af Wyvonnia Lora   ?Date Value Ref Range Status  ?06/02/2020 102 >59 mL/min/1.73 Final  ?  Comment:  ?  **[facility] currently reports eGFR in compliance with the current** ?  recommendations of the [employer]. [facility] will ?  update reporting as new guidelines are published from the NKF-ASN ?  Task force. ?  ? ?eGFR  ?Date Value Ref Range Status  ?07/25/2021 96 >59 mL/min/1.73 Final  ? ?EGFR (Non-African Amer.)  ?Date Value Ref Range Status  ?06/16/2014 >60  Final  ?  Comment:  ?  eGFR values <19mL/min/1.73 m2 may be an indication of chronic ?kidney disease (CKD). ?Calculated eGFR is useful in patients with stable renal function. ?The eGFR calculation will not be reliable in acutely ill patients ?when serum creatinine is changing rapidly. It is not useful in  ?patients on dialysis. The eGFR calculation may not be applicable ?to patients at the low and high extremes of body sizes, pregnant ?women, and vegetarians. ?  ? ?GFR calc non Af Amer  ?Date Value Ref Range Status  ?06/02/2020 88 >59 mL/min/1.73 Final  ? ?  ?  ?  ? ? ? ?Requested Prescriptions  ?Pending Prescriptions Disp Refills  ? QUEtiapine (SEROQUEL) 300 MG tablet [Pharmacy Med Name: QUETIAPINE FUMARATE 300 MG TAB] 90 tablet 1  ?  Sig: TAKE 1 TABLET BY MOUTH EVERYDAY AT BEDTIME  ?  ? Not Delegated - Psychiatry:  Antipsychotics - Second Generation (Atypical) - quetiapine Failed - 03/31/2022  6:09 PM  ?  ?  Failed - This refill cannot be delegated  ?  ?  Failed - Lipid Panel in normal range within the last 12 months  ?  Cholesterol, Total  ?Date Value Ref Range Status  ?07/25/2021 180 100 - 199 mg/dL Final  ? ?LDL Chol Calc (NIH)  ?Date Value Ref Range Status  ?07/25/2021 121 (H) 0 - 99 mg/dL Final  ? ?HDL  ?Date Value Ref Range Status  ?07/25/2021 41 >39 mg/dL Final  ? ?Triglycerides  ?Date Value Ref Range Status  ?07/25/2021 100 0 - 149 mg/dL Final  ? ?  ?  ?  Passed - TSH in normal range and within 360 days  ?  TSH  ?Date Value Ref Range Status  ?07/25/2021  3.110 0.450 - 4.500 uIU/mL Final  ?  ?  ?  ?  Passed - Completed PHQ-2 or PHQ-9 in the last 360 days  ?  ?  Passed - Last BP in normal range  ?  BP Readings from Last 1 Encounters:  ?10/21/21 132/88  ?  ?  ?  ?  [place]

## 2022-04-08 ENCOUNTER — Other Ambulatory Visit: Payer: Self-pay | Admitting: Nurse Practitioner

## 2022-04-10 NOTE — Telephone Encounter (Signed)
Requested medication (s) are due for refill today:   Provider to review ? ?Requested medication (s) are on the active medication list:   Yes ? ?Future visit scheduled:   Yes in 1 wk ? ? ?Last ordered: 01/27/2022 #90, 0 refills ? ?Non delegated refill reason returned  ? ?Requested Prescriptions  ?Pending Prescriptions Disp Refills  ? QUEtiapine (SEROQUEL) 300 MG tablet [Pharmacy Med Name: QUETIAPINE FUMARATE 300 MG TAB] 90 tablet 1  ?  Sig: TAKE 1 TABLET BY MOUTH EVERYDAY AT BEDTIME  ?  ? Not Delegated - Psychiatry:  Antipsychotics - Second Generation (Atypical) - quetiapine Failed - 04/08/2022  1:44 PM  ?  ?  Failed - This refill cannot be delegated  ?  ?  Failed - Lipid Panel in normal range within the last 12 months  ?  Cholesterol, Total  ?Date Value Ref Range Status  ?07/25/2021 180 100 - 199 mg/dL Final  ? ?LDL Chol Calc (NIH)  ?Date Value Ref Range Status  ?07/25/2021 121 (H) 0 - 99 mg/dL Final  ? ?HDL  ?Date Value Ref Range Status  ?07/25/2021 41 >39 mg/dL Final  ? ?Triglycerides  ?Date Value Ref Range Status  ?07/25/2021 100 0 - 149 mg/dL Final  ? ?  ?  ?  Passed - TSH in normal range and within 360 days  ?  TSH  ?Date Value Ref Range Status  ?07/25/2021 3.110 0.450 - 4.500 uIU/mL Final  ?  ?  ?  ?  Passed - Completed PHQ-2 or PHQ-9 in the last 360 days  ?  ?  Passed - Last BP in normal range  ?  BP Readings from Last 1 Encounters:  ?10/21/21 132/88  ?  ?  ?  ?  Passed - Last Heart Rate in normal range  ?  Pulse Readings from Last 1 Encounters:  ?10/21/21 96  ?  ?  ?  ?  Passed - Valid encounter within last 6 months  ?  Recent Outpatient Visits   ? ?      ? 5 months ago Bipolar affective disorder, depressed, severe, with psychotic behavior ([place])  ? [place], Henrine Screws T, NP  ? 8 months ago Routine general medical examination at a health care facility  ? [place], Megan P, DO  ? 8 months ago Closed nondisplaced fracture of medial cuneiform of left foot with delayed  healing, subsequent encounter  ? [place], Megan P, DO  ? 1 year ago Peripheral edema  ? [place], Megan P, DO  ? 1 year ago Lumbar radicular pain  ? [place], [place] P, DO  ? ?  ?  ?Future Appointments   ? ?        ? In 1 week Wynetta Emery, Megan P, DO [facility]  ? ?  ? ? ?  ?  ?  Passed - CBC within normal limits and completed in the last 12 months  ?  WBC  ?Date Value Ref Range Status  ?07/25/2021 7.6 3.4 - 10.8 x10E3/uL Final  ?05/21/2020 10.5 4.0 - 10.5 K/uL Final  ? ?RBC  ?Date Value Ref Range Status  ?07/25/2021 5.09 4.14 - 5.80 x10E6/uL Final  ?05/21/2020 5.12 4.22 - 5.81 MIL/uL Final  ? ?Hemoglobin  ?Date Value Ref Range Status  ?07/25/2021 15.1 13.0 - 17.7 g/dL Final  ? ?Hematocrit  ?Date Value Ref Range Status  ?07/25/2021 44.8 37.5 - 51.0 %  Final  ? ?MCHC  ?Date Value Ref Range Status  ?07/25/2021 33.7 31.5 - 35.7 g/dL Final  ?05/21/2020 35.5 30.0 - 36.0 g/dL Final  ? ?MCH  ?Date Value Ref Range Status  ?07/25/2021 29.7 26.6 - 33.0 pg Final  ?05/21/2020 31.1 26.0 - 34.0 pg Final  ? ?MCV  ?Date Value Ref Range Status  ?07/25/2021 88 79 - 97 fL Final  ?06/16/2014 93 80 - 100 fL Final  ? ?No results found for: PLTCOUNTKUC, LABPLAT, [place] ?RDW  ?Date Value Ref Range Status  ?07/25/2021 13.2 11.6 - 15.4 % Final  ?06/16/2014 13.0 11.5 - 14.5 % Final  ? ?  ?  ?  Passed - CMP within normal limits and completed in the last 12 months  ?  Albumin  ?Date Value Ref Range Status  ?07/25/2021 4.8 4.0 - 5.0 g/dL Final  ?06/16/2014 3.8 3.4 - 5.0 g/dL Final  ? ?Alkaline Phosphatase  ?Date Value Ref Range Status  ?07/25/2021 157 (H) 44 - 121 IU/L Final  ?06/16/2014 88 Unit/L Final  ?  Comment:  ?  45-117 ?NOTE: New Reference Range ?11/14/13 ?  ? ?ALT  ?Date Value Ref Range Status  ?07/25/2021 10 0 - 44 IU/L Final  ? ?SGPT (ALT)  ?Date Value Ref Range Status  ?06/16/2014 18 12 - 78 U/L Final  ? ?AST  ?Date Value Ref Range Status   ?07/25/2021 26 0 - 40 IU/L Final  ? ?SGOT(AST)  ?Date Value Ref Range Status  ?06/16/2014 16 15 - 37 Unit/L Final  ? ?BUN  ?Date Value Ref Range Status  ?07/25/2021 10 6 - 24 mg/dL Final  ?06/16/2014 8 7 - 18 mg/dL Final  ? ?Calcium  ?Date Value Ref Range Status  ?07/25/2021 9.3 8.7 - 10.2 mg/dL Final  ? ?Calcium, Total  ?Date Value Ref Range Status  ?06/16/2014 8.5 8.5 - 10.1 mg/dL Final  ? ?CO2  ?Date Value Ref Range Status  ?07/25/2021 21 20 - 29 mmol/L Final  ? ?Co2  ?Date Value Ref Range Status  ?06/16/2014 24 21 - 32 mmol/L Final  ? ?Creatinine  ?Date Value Ref Range Status  ?06/16/2014 0.87 0.60 - 1.30 mg/dL Final  ? ?Creatinine, Ser  ?Date Value Ref Range Status  ?07/25/2021 0.96 0.76 - 1.27 mg/dL Final  ? ?Glucose  ?Date Value Ref Range Status  ?07/25/2021 89 65 - 99 mg/dL Final  ?06/16/2014 119 (H) 65 - 99 mg/dL Final  ? ?Glucose, Bld  ?Date Value Ref Range Status  ?05/21/2020 92 70 - 99 mg/dL Final  ?  Comment:  ?  Glucose reference range applies only to samples taken after fasting for at least 8 hours.  ? ?Potassium  ?Date Value Ref Range Status  ?07/25/2021 CANCELED mmol/L   ?  Comment:  ?  Test not performed. Specimen is hemolyzed. Unable to obtain ?valid results. ? ?Result canceled by the ancillary. ?  ?06/16/2014 3.7 3.5 - 5.1 mmol/L Final  ? ?Sodium  ?Date Value Ref Range Status  ?07/25/2021 138 134 - 144 mmol/L Final  ?06/16/2014 140 136 - 145 mmol/L Final  ? ?Bilirubin,Total  ?Date Value Ref Range Status  ?06/16/2014 0.6 0.2 - 1.0 mg/dL Final  ? ?Bilirubin Total  ?Date Value Ref Range Status  ?07/25/2021 0.3 0.0 - 1.2 mg/dL Final  ? ?Protein, ur  ?Date Value Ref Range Status  ?11/02/2019 NEGATIVE NEGATIVE mg/dL Final  ? ?Protein,UA  ?Date Value Ref Range Status  ?07/25/2021 Negative Negative/Trace Final  ? ?Total Protein  ?  Date Value Ref Range Status  ?07/25/2021 7.7 6.0 - 8.5 g/dL Final  ?06/16/2014 6.7 6.4 - 8.2 g/dL Final  ? ?EGFR (African American)  ?Date Value Ref Range Status  ?06/16/2014  >60  Final  ? ?GFR calc Af Wyvonnia Lora  ?Date Value Ref Range Status  ?06/02/2020 102 >59 mL/min/1.73 Final  ?  Comment:  ?  **[facility] currently reports eGFR in compliance with the current** ?  recommendations of the [employer]. [facility] will ?  update reporting as new guidelines are published from the NKF-ASN ?  Task force. ?  ? ?eGFR  ?Date Value Ref Range Status  ?07/25/2021 96 >59 mL/min/1.73 Final  ? ?EGFR (Non-African Amer.)  ?Date Value Ref Range Status  ?06/16/2014 >60  Final  ?  Comment:  ?  eGFR values <39mL/min/1.73 m2 may be an indication of chronic ?kidney disease (CKD). ?Calculated eGFR is useful in patients with stable renal function. ?The eGFR calculation will not be reliable in acutely ill patients ?when serum creatinine is changing rapidly. It is not useful in  ?patients on dialysis. The eGFR calculation may not be applicable ?to patients at the low and high extremes of body sizes, pregnant ?women, and vegetarians. ?  ? ?GFR calc non Af Amer  ?Date Value Ref Range Status  ?06/02/2020 88 >59 mL/min/1.73 Final  ? ?  ?  ?  ? ?

## 2022-04-21 ENCOUNTER — Ambulatory Visit: Payer: Medicaid Other | Admitting: Family Medicine

## 2022-04-23 ENCOUNTER — Other Ambulatory Visit: Payer: Self-pay | Admitting: Family Medicine

## 2022-04-23 ENCOUNTER — Telehealth: Payer: Self-pay | Admitting: Nurse Practitioner

## 2022-04-24 NOTE — Telephone Encounter (Signed)
Requested medications are due for refill today.  yes ? ?Requested medications are on the active medications list.  yes ? ?Last refill. 02/21/2022 #90 1 refill ? ?Future visit scheduled.   no ? ?Notes to clinic.  Refill is going to a pharmacy in [place]. ? ? ? ?Requested Prescriptions  ?Pending Prescriptions Disp Refills  ? gabapentin (NEURONTIN) 600 MG tablet [Pharmacy Med Name: GABAPENTIN 600 MG TABLET] 90 tablet 1  ?  Sig: TAKE 1 TABLET BY MOUTH THREE TIMES A DAY  ?  ? Neurology: Anticonvulsants - gabapentin Passed - 04/23/2022 11:20 AM  ?  ?  Passed - Cr in normal range and within 360 days  ?  Creatinine  ?Date Value Ref Range Status  ?06/16/2014 0.87 0.60 - 1.30 mg/dL Final  ? ?Creatinine, Ser  ?Date Value Ref Range Status  ?07/25/2021 0.96 0.76 - 1.27 mg/dL Final  ?  ?  ?  ?  Passed - Completed PHQ-2 or PHQ-9 in the last 360 days  ?  ?  Passed - Valid encounter within last 12 months  ?  Recent Outpatient Visits   ? ?      ? 6 months ago Bipolar affective disorder, depressed, severe, with psychotic behavior ([place])  ? [place], Henrine Screws T, NP  ? 8 months ago Routine general medical examination at a health care facility  ? [place], Megan P, DO  ? 9 months ago Closed nondisplaced fracture of medial cuneiform of left foot with delayed healing, subsequent encounter  ? [place], Megan P, DO  ? 1 year ago Peripheral edema  ? [place] P, DO  ? 2 years ago Lumbar radicular pain  ? [place], [place] P, DO  ? ?  ?  ? ? ?  ?  ?  ?  ?

## 2022-04-25 NOTE — Telephone Encounter (Signed)
Patient is due for a follow up appointment. Please call to schedule and ask if patient has enough medication to get to the appointment. Then route to the provider.  ?

## 2022-04-25 NOTE — Telephone Encounter (Addendum)
Pt states he has been out for 4 days and says he does not have the money to get a new dr. Liana Gerold virtual visit. ?Pt would like his Rx today. ?He states he is having a hard time getting his medicaid changed to [place]. ?First available is 6/14 at 10:40am. ?Is that OK? ?

## 2022-04-25 NOTE — Telephone Encounter (Signed)
Patient called in to inform that he has gotten this Rx before and is not understanding why he cant get the refill now. Patient asking for a call back from Dr Wynetta Emery to explain why he is not able to get this refill can be reached at Ph# [phone] ?

## 2022-04-25 NOTE — Telephone Encounter (Signed)
Patient is due for an appointment. Please call to schedule and ask if patient has enough medication to get to the appointment. Then route to provider.  ?

## 2022-04-25 NOTE — Telephone Encounter (Signed)
Requested medications are due for refill today.  yes ? ?Requested medications are on the active medications list.  yes ? ?Last refill. 01/27/2022 #90 0 refills ? ?Future visit scheduled.   no ? ?Notes to clinic.  Medication refill is not delegated. Pharmacy is in [place]. Pt is due for OV . ? ? ? ?Requested Prescriptions  ?Pending Prescriptions Disp Refills  ? QUEtiapine (SEROQUEL) 300 MG tablet [Pharmacy Med Name: QUETIAPINE FUMARATE 300 MG TAB] 90 tablet 1  ?  Sig: TAKE 1 TABLET BY MOUTH EVERYDAY AT BEDTIME  ?  ? Not Delegated - Psychiatry:  Antipsychotics - Second Generation (Atypical) - quetiapine Failed - 04/23/2022  3:37 PM  ?  ?  Failed - This refill cannot be delegated  ?  ?  Failed - Valid encounter within last 6 months  ?  Recent Outpatient Visits   ? ?      ? 6 months ago Bipolar affective disorder, depressed, severe, with psychotic behavior ([place])  ? [place], Henrine Screws T, NP  ? 8 months ago Routine general medical examination at a health care facility  ? [place], Megan P, DO  ? 9 months ago Closed nondisplaced fracture of medial cuneiform of left foot with delayed healing, subsequent encounter  ? [place], Megan P, DO  ? 1 year ago Peripheral edema  ? [place] P, DO  ? 2 years ago Lumbar radicular pain  ? [place], [place] P, DO  ? ?  ?  ? ? ?  ?  ?  Failed - Lipid Panel in normal range within the last 12 months  ?  Cholesterol, Total  ?Date Value Ref Range Status  ?07/25/2021 180 100 - 199 mg/dL Final  ? ?LDL Chol Calc (NIH)  ?Date Value Ref Range Status  ?07/25/2021 121 (H) 0 - 99 mg/dL Final  ? ?HDL  ?Date Value Ref Range Status  ?07/25/2021 41 >39 mg/dL Final  ? ?Triglycerides  ?Date Value Ref Range Status  ?07/25/2021 100 0 - 149 mg/dL Final  ? ?  ?  ?  Passed - TSH in normal range and within 360 days  ?  TSH  ?Date Value Ref Range Status  ?07/25/2021 3.110 0.450 - 4.500 uIU/mL Final  ?   ?  ?  ?  Passed - Completed PHQ-2 or PHQ-9 in the last 360 days  ?  ?  Passed - Last BP in normal range  ?  BP Readings from Last 1 Encounters:  ?10/21/21 132/88  ?  ?  ?  ?  Passed - Last Heart Rate in normal range  ?  Pulse Readings from Last 1 Encounters:  ?10/21/21 96  ?  ?  ?  ?  Passed - CBC within normal limits and completed in the last 12 months  ?  WBC  ?Date Value Ref Range Status  ?07/25/2021 7.6 3.4 - 10.8 x10E3/uL Final  ?05/21/2020 10.5 4.0 - 10.5 K/uL Final  ? ?RBC  ?Date Value Ref Range Status  ?07/25/2021 5.09 4.14 - 5.80 x10E6/uL Final  ?05/21/2020 5.12 4.22 - 5.81 MIL/uL Final  ? ?Hemoglobin  ?Date Value Ref Range Status  ?07/25/2021 15.1 13.0 - 17.7 g/dL Final  ? ?Hematocrit  ?Date Value Ref Range Status  ?07/25/2021 44.8 37.5 - 51.0 % Final  ? ?MCHC  ?Date Value Ref Range Status  ?07/25/2021 33.7 31.5 - 35.7 g/dL Final  ?05/21/2020 35.5 30.0 - 36.0  g/dL Final  ? ?MCH  ?Date Value Ref Range Status  ?07/25/2021 29.7 26.6 - 33.0 pg Final  ?05/21/2020 31.1 26.0 - 34.0 pg Final  ? ?MCV  ?Date Value Ref Range Status  ?07/25/2021 88 79 - 97 fL Final  ?06/16/2014 93 80 - 100 fL Final  ? ?No results found for: PLTCOUNTKUC, LABPLAT, [place] ?RDW  ?Date Value Ref Range Status  ?07/25/2021 13.2 11.6 - 15.4 % Final  ?06/16/2014 13.0 11.5 - 14.5 % Final  ? ?  ?  ?  Passed - CMP within normal limits and completed in the last 12 months  ?  Albumin  ?Date Value Ref Range Status  ?07/25/2021 4.8 4.0 - 5.0 g/dL Final  ?06/16/2014 3.8 3.4 - 5.0 g/dL Final  ? ?Alkaline Phosphatase  ?Date Value Ref Range Status  ?07/25/2021 157 (H) 44 - 121 IU/L Final  ?06/16/2014 88 Unit/L Final  ?  Comment:  ?  45-117 ?NOTE: New Reference Range ?11/14/13 ?  ? ?ALT  ?Date Value Ref Range Status  ?07/25/2021 10 0 - 44 IU/L Final  ? ?SGPT (ALT)  ?Date Value Ref Range Status  ?06/16/2014 18 12 - 78 U/L Final  ? ?AST  ?Date Value Ref Range Status  ?07/25/2021 26 0 - 40 IU/L Final  ? ?SGOT(AST)  ?Date Value Ref Range Status  ?06/16/2014  16 15 - 37 Unit/L Final  ? ?BUN  ?Date Value Ref Range Status  ?07/25/2021 10 6 - 24 mg/dL Final  ?06/16/2014 8 7 - 18 mg/dL Final  ? ?Calcium  ?Date Value Ref Range Status  ?07/25/2021 9.3 8.7 - 10.2 mg/dL Final  ? ?Calcium, Total  ?Date Value Ref Range Status  ?06/16/2014 8.5 8.5 - 10.1 mg/dL Final  ? ?CO2  ?Date Value Ref Range Status  ?07/25/2021 21 20 - 29 mmol/L Final  ? ?Co2  ?Date Value Ref Range Status  ?06/16/2014 24 21 - 32 mmol/L Final  ? ?Creatinine  ?Date Value Ref Range Status  ?06/16/2014 0.87 0.60 - 1.30 mg/dL Final  ? ?Creatinine, Ser  ?Date Value Ref Range Status  ?07/25/2021 0.96 0.76 - 1.27 mg/dL Final  ? ?Glucose  ?Date Value Ref Range Status  ?07/25/2021 89 65 - 99 mg/dL Final  ?06/16/2014 119 (H) 65 - 99 mg/dL Final  ? ?Glucose, Bld  ?Date Value Ref Range Status  ?05/21/2020 92 70 - 99 mg/dL Final  ?  Comment:  ?  Glucose reference range applies only to samples taken after fasting for at least 8 hours.  ? ?Potassium  ?Date Value Ref Range Status  ?07/25/2021 CANCELED mmol/L   ?  Comment:  ?  Test not performed. Specimen is hemolyzed. Unable to obtain ?valid results. ? ?Result canceled by the ancillary. ?  ?06/16/2014 3.7 3.5 - 5.1 mmol/L Final  ? ?Sodium  ?Date Value Ref Range Status  ?07/25/2021 138 134 - 144 mmol/L Final  ?06/16/2014 140 136 - 145 mmol/L Final  ? ?Bilirubin,Total  ?Date Value Ref Range Status  ?06/16/2014 0.6 0.2 - 1.0 mg/dL Final  ? ?Bilirubin Total  ?Date Value Ref Range Status  ?07/25/2021 0.3 0.0 - 1.2 mg/dL Final  ? ?Protein, ur  ?Date Value Ref Range Status  ?11/02/2019 NEGATIVE NEGATIVE mg/dL Final  ? ?Protein,UA  ?Date Value Ref Range Status  ?07/25/2021 Negative Negative/Trace Final  ? ?Total Protein  ?Date Value Ref Range Status  ?07/25/2021 7.7 6.0 - 8.5 g/dL Final  ?06/16/2014 6.7 6.4 - 8.2 g/dL Final  ? ?EGFR (  African American)  ?Date Value Ref Range Status  ?06/16/2014 >60  Final  ? ?GFR calc Af Wyvonnia Lora  ?Date Value Ref Range Status  ?06/02/2020 102 >59  mL/min/1.73 Final  ?  Comment:  ?  **[facility] currently reports eGFR in compliance with the current** ?  recommendations of the [employer]. [facility] will ?  update reporting as new guidelines are published from the NKF-ASN ?  Task force. ?  ? ?eGFR  ?Date Value Ref Range Status  ?07/25/2021 96 >59 mL/min/1.73 Final  ? ?EGFR (Non-African Amer.)  ?Date Value Ref Range Status  ?06/16/2014 >60  Final  ?  Comment:  ?  eGFR values <44mL/min/1.73 m2 may be an indication of chronic ?kidney disease (CKD). ?Calculated eGFR is useful in patients with stable renal function. ?The eGFR calculation will not be reliable in acutely ill patients ?when serum creatinine is changing rapidly. It is not useful in  ?patients on dialysis. The eGFR calculation may not be applicable ?to patients at the low and high extremes of body sizes, pregnant ?women, and vegetarians. ?  ? ?GFR calc non Af Amer  ?Date Value Ref Range Status  ?06/02/2020 88 >59 mL/min/1.73 Final  ? ?  ?  ?  ?  ?

## 2022-04-25 NOTE — Telephone Encounter (Signed)
Spoke with patient regarding gabapentin and discussed how Dr. Wynetta Emery is unable to rx due to being a controlled substance in [place]. Pt has voiced understanding and will go to urgent care. No further questions at this time.  ?

## 2022-04-25 NOTE — Telephone Encounter (Signed)
Spoke with pt and he states he is out of medication completely. He moved to [place] and will not be able to be seen by a provider until about 5-6 months from now. ?

## 2022-04-25 NOTE — Telephone Encounter (Signed)
Unfortunately gabapentin is a controlled substance in [place], so I cannot send it across state lines. I am sorry he is having issues, but I would advise him to get hooked up with someone in [place], even if it's an urgent care.  ?

## 2022-04-30 ENCOUNTER — Other Ambulatory Visit: Payer: Self-pay | Admitting: Nurse Practitioner

## 2022-05-01 NOTE — Telephone Encounter (Signed)
Requested medications are due for refill today.  yes ? ?Requested medications are on the active medications list.  yes ? ?Last refill. 01/27/2022 #90 0 refills ? ?Future visit scheduled.   yes ? ?Notes to clinic.  Medication refill is not delegated. ? ? ? ?Requested Prescriptions  ?Pending Prescriptions Disp Refills  ? QUEtiapine (SEROQUEL) 300 MG tablet [Pharmacy Med Name: QUETIAPINE FUMARATE 300 MG TAB] 90 tablet 1  ?  Sig: TAKE 1 TABLET BY MOUTH EVERYDAY AT BEDTIME  ?  ? Not Delegated - Psychiatry:  Antipsychotics - Second Generation (Atypical) - quetiapine Failed - 04/30/2022  1:27 PM  ?  ?  Failed - This refill cannot be delegated  ?  ?  Failed - Valid encounter within last 6 months  ?  Recent Outpatient Visits   ? ?      ? 6 months ago Bipolar affective disorder, depressed, severe, with psychotic behavior ([place])  ? [place], Henrine Screws T, NP  ? 9 months ago Routine general medical examination at a health care facility  ? [place], Megan P, DO  ? 9 months ago Closed nondisplaced fracture of medial cuneiform of left foot with delayed healing, subsequent encounter  ? [place], Megan P, DO  ? 1 year ago Peripheral edema  ? [place] P, DO  ? 2 years ago Lumbar radicular pain  ? [place], [place] P, DO  ? ?  ?  ?Future Appointments   ? ?        ? In 1 month Johnson, Barb Merino, DO [facility]  ? ?  ? ? ?  ?  ?  Failed - Lipid Panel in normal range within the last 12 months  ?  Cholesterol, Total  ?Date Value Ref Range Status  ?07/25/2021 180 100 - 199 mg/dL Final  ? ?LDL Chol Calc (NIH)  ?Date Value Ref Range Status  ?07/25/2021 121 (H) 0 - 99 mg/dL Final  ? ?HDL  ?Date Value Ref Range Status  ?07/25/2021 41 >39 mg/dL Final  ? ?Triglycerides  ?Date Value Ref Range Status  ?07/25/2021 100 0 - 149 mg/dL Final  ? ?  ?  ?  Passed - TSH in normal range and within 360 days  ?  TSH  ?Date Value  Ref Range Status  ?07/25/2021 3.110 0.450 - 4.500 uIU/mL Final  ?  ?  ?  ?  Passed - Completed PHQ-2 or PHQ-9 in the last 360 days  ?  ?  Passed - Last BP in normal range  ?  BP Readings from Last 1 Encounters:  ?10/21/21 132/88  ?  ?  ?  ?  Passed - Last Heart Rate in normal range  ?  Pulse Readings from Last 1 Encounters:  ?10/21/21 96  ?  ?  ?  ?  Passed - CBC within normal limits and completed in the last 12 months  ?  WBC  ?Date Value Ref Range Status  ?07/25/2021 7.6 3.4 - 10.8 x10E3/uL Final  ?05/21/2020 10.5 4.0 - 10.5 K/uL Final  ? ?RBC  ?Date Value Ref Range Status  ?07/25/2021 5.09 4.14 - 5.80 x10E6/uL Final  ?05/21/2020 5.12 4.22 - 5.81 MIL/uL Final  ? ?Hemoglobin  ?Date Value Ref Range Status  ?07/25/2021 15.1 13.0 - 17.7 g/dL Final  ? ?Hematocrit  ?Date Value Ref Range Status  ?07/25/2021 44.8 37.5 - 51.0 % Final  ? ?MCHC  ?  Date Value Ref Range Status  ?07/25/2021 33.7 31.5 - 35.7 g/dL Final  ?05/21/2020 35.5 30.0 - 36.0 g/dL Final  ? ?MCH  ?Date Value Ref Range Status  ?07/25/2021 29.7 26.6 - 33.0 pg Final  ?05/21/2020 31.1 26.0 - 34.0 pg Final  ? ?MCV  ?Date Value Ref Range Status  ?07/25/2021 88 79 - 97 fL Final  ?06/16/2014 93 80 - 100 fL Final  ? ?No results found for: PLTCOUNTKUC, LABPLAT, [place] ?RDW  ?Date Value Ref Range Status  ?07/25/2021 13.2 11.6 - 15.4 % Final  ?06/16/2014 13.0 11.5 - 14.5 % Final  ? ?  ?  ?  Passed - CMP within normal limits and completed in the last 12 months  ?  Albumin  ?Date Value Ref Range Status  ?07/25/2021 4.8 4.0 - 5.0 g/dL Final  ?06/16/2014 3.8 3.4 - 5.0 g/dL Final  ? ?Alkaline Phosphatase  ?Date Value Ref Range Status  ?07/25/2021 157 (H) 44 - 121 IU/L Final  ?06/16/2014 88 Unit/L Final  ?  Comment:  ?  45-117 ?NOTE: New Reference Range ?11/14/13 ?  ? ?ALT  ?Date Value Ref Range Status  ?07/25/2021 10 0 - 44 IU/L Final  ? ?SGPT (ALT)  ?Date Value Ref Range Status  ?06/16/2014 18 12 - 78 U/L Final  ? ?AST  ?Date Value Ref Range Status  ?07/25/2021 26 0 - 40  IU/L Final  ? ?SGOT(AST)  ?Date Value Ref Range Status  ?06/16/2014 16 15 - 37 Unit/L Final  ? ?BUN  ?Date Value Ref Range Status  ?07/25/2021 10 6 - 24 mg/dL Final  ?06/16/2014 8 7 - 18 mg/dL Final  ? ?Calcium  ?Date Value Ref Range Status  ?07/25/2021 9.3 8.7 - 10.2 mg/dL Final  ? ?Calcium, Total  ?Date Value Ref Range Status  ?06/16/2014 8.5 8.5 - 10.1 mg/dL Final  ? ?CO2  ?Date Value Ref Range Status  ?07/25/2021 21 20 - 29 mmol/L Final  ? ?Co2  ?Date Value Ref Range Status  ?06/16/2014 24 21 - 32 mmol/L Final  ? ?Creatinine  ?Date Value Ref Range Status  ?06/16/2014 0.87 0.60 - 1.30 mg/dL Final  ? ?Creatinine, Ser  ?Date Value Ref Range Status  ?07/25/2021 0.96 0.76 - 1.27 mg/dL Final  ? ?Glucose  ?Date Value Ref Range Status  ?07/25/2021 89 65 - 99 mg/dL Final  ?06/16/2014 119 (H) 65 - 99 mg/dL Final  ? ?Glucose, Bld  ?Date Value Ref Range Status  ?05/21/2020 92 70 - 99 mg/dL Final  ?  Comment:  ?  Glucose reference range applies only to samples taken after fasting for at least 8 hours.  ? ?Potassium  ?Date Value Ref Range Status  ?07/25/2021 CANCELED mmol/L   ?  Comment:  ?  Test not performed. Specimen is hemolyzed. Unable to obtain ?valid results. ? ?Result canceled by the ancillary. ?  ?06/16/2014 3.7 3.5 - 5.1 mmol/L Final  ? ?Sodium  ?Date Value Ref Range Status  ?07/25/2021 138 134 - 144 mmol/L Final  ?06/16/2014 140 136 - 145 mmol/L Final  ? ?Bilirubin,Total  ?Date Value Ref Range Status  ?06/16/2014 0.6 0.2 - 1.0 mg/dL Final  ? ?Bilirubin Total  ?Date Value Ref Range Status  ?07/25/2021 0.3 0.0 - 1.2 mg/dL Final  ? ?Protein, ur  ?Date Value Ref Range Status  ?11/02/2019 NEGATIVE NEGATIVE mg/dL Final  ? ?Protein,UA  ?Date Value Ref Range Status  ?07/25/2021 Negative Negative/Trace Final  ? ?Total Protein  ?Date Value Ref Range Status  ?  07/25/2021 7.7 6.0 - 8.5 g/dL Final  ?06/16/2014 6.7 6.4 - 8.2 g/dL Final  ? ?EGFR (African American)  ?Date Value Ref Range Status  ?06/16/2014 >60  Final  ? ?GFR calc  Af Wyvonnia Lora  ?Date Value Ref Range Status  ?06/02/2020 102 >59 mL/min/1.73 Final  ?  Comment:  ?  **[facility] currently reports eGFR in compliance with the current** ?  recommendations of the [employer]. [facility] will ?  update reporting as new guidelines are published from the NKF-ASN ?  Task force. ?  ? ?eGFR  ?Date Value Ref Range Status  ?07/25/2021 96 >59 mL/min/1.73 Final  ? ?EGFR (Non-African Amer.)  ?Date Value Ref Range Status  ?06/16/2014 >60  Final  ?  Comment:  ?  eGFR values <51mL/min/1.73 m2 may be an indication of chronic ?kidney disease (CKD). ?Calculated eGFR is useful in patients with stable renal function. ?The eGFR calculation will not be reliable in acutely ill patients ?when serum creatinine is changing rapidly. It is not useful in  ?patients on dialysis. The eGFR calculation may not be applicable ?to patients at the low and high extremes of body sizes, pregnant ?women, and vegetarians. ?  ? ?GFR calc non Af Amer  ?Date Value Ref Range Status  ?06/02/2020 88 >59 mL/min/1.73 Final  ? ?  ?  ?  ?  ?

## 2022-05-02 NOTE — Telephone Encounter (Signed)
Patient has appointment scheduled for 06/07/22 ?

## 2022-05-06 ENCOUNTER — Other Ambulatory Visit: Payer: Self-pay | Admitting: Family Medicine

## 2022-05-09 NOTE — Telephone Encounter (Signed)
Requested medications are due for refill today.  no ? ?Requested medications are on the active medications list.  yes ? ?Last refill. 05/02/2022 #30 1 refill ? ?Future visit scheduled.   yes ? ?Notes to clinic.  Medication refill is not delegated. Medication refilled 05/02/2022 #30 1 refill. Pharmacy confirmed receipt. ? ? ? ?Requested Prescriptions  ?Pending Prescriptions Disp Refills  ? QUEtiapine (SEROQUEL) 300 MG tablet [Pharmacy Med Name: QUETIAPINE FUMARATE 300 MG TAB] 90 tablet 1  ?  Sig: TAKE 1 TABLET BY MOUTH EVERYDAY AT BEDTIME  ?  ? Not Delegated - Psychiatry:  Antipsychotics - Second Generation (Atypical) - quetiapine Failed - 05/06/2022  2:15 PM  ?  ?  Failed - This refill cannot be delegated  ?  ?  Failed - Valid encounter within last 6 months  ?  Recent Outpatient Visits   ? ?      ? 6 months ago Bipolar affective disorder, depressed, severe, with psychotic behavior ([place])  ? [place], Henrine Screws T, NP  ? 9 months ago Routine general medical examination at a health care facility  ? [place], Megan P, DO  ? 9 months ago Closed nondisplaced fracture of medial cuneiform of left foot with delayed healing, subsequent encounter  ? [place], Megan P, DO  ? 1 year ago Peripheral edema  ? [place] P, DO  ? 2 years ago Lumbar radicular pain  ? [place], [place] P, DO  ? ?  ?  ?Future Appointments   ? ?        ? In 4 weeks Wynetta Emery, Barb Merino, DO [facility]  ? ?  ? ? ?  ?  ?  Failed - Lipid Panel in normal range within the last 12 months  ?  Cholesterol, Total  ?Date Value Ref Range Status  ?07/25/2021 180 100 - 199 mg/dL Final  ? ?LDL Chol Calc (NIH)  ?Date Value Ref Range Status  ?07/25/2021 121 (H) 0 - 99 mg/dL Final  ? ?HDL  ?Date Value Ref Range Status  ?07/25/2021 41 >39 mg/dL Final  ? ?Triglycerides  ?Date Value Ref Range Status  ?07/25/2021 100 0 - 149 mg/dL Final  ? ?  ?  ?   Passed - TSH in normal range and within 360 days  ?  TSH  ?Date Value Ref Range Status  ?07/25/2021 3.110 0.450 - 4.500 uIU/mL Final  ?   ?  ?  Passed - Completed PHQ-2 or PHQ-9 in the last 360 days  ?  ?  Passed - Last BP in normal range  ?  BP Readings from Last 1 Encounters:  ?10/21/21 132/88  ?   ?  ?  Passed - Last Heart Rate in normal range  ?  Pulse Readings from Last 1 Encounters:  ?10/21/21 96  ?   ?  ?  Passed - CBC within normal limits and completed in the last 12 months  ?  WBC  ?Date Value Ref Range Status  ?07/25/2021 7.6 3.4 - 10.8 x10E3/uL Final  ?05/21/2020 10.5 4.0 - 10.5 K/uL Final  ? ?RBC  ?Date Value Ref Range Status  ?07/25/2021 5.09 4.14 - 5.80 x10E6/uL Final  ?05/21/2020 5.12 4.22 - 5.81 MIL/uL Final  ? ?Hemoglobin  ?Date Value Ref Range Status  ?07/25/2021 15.1 13.0 - 17.7 g/dL Final  ? ?Hematocrit  ?Date Value Ref Range Status  ?07/25/2021 44.8 37.5 - 51.0 %  Final  ? ?MCHC  ?Date Value Ref Range Status  ?07/25/2021 33.7 31.5 - 35.7 g/dL Final  ?05/21/2020 35.5 30.0 - 36.0 g/dL Final  ? ?MCH  ?Date Value Ref Range Status  ?07/25/2021 29.7 26.6 - 33.0 pg Final  ?05/21/2020 31.1 26.0 - 34.0 pg Final  ? ?MCV  ?Date Value Ref Range Status  ?07/25/2021 88 79 - 97 fL Final  ?06/16/2014 93 80 - 100 fL Final  ? ?No results found for: PLTCOUNTKUC, LABPLAT, [place] ?RDW  ?Date Value Ref Range Status  ?07/25/2021 13.2 11.6 - 15.4 % Final  ?06/16/2014 13.0 11.5 - 14.5 % Final  ? ?  ?  ?  Passed - CMP within normal limits and completed in the last 12 months  ?  Albumin  ?Date Value Ref Range Status  ?07/25/2021 4.8 4.0 - 5.0 g/dL Final  ?06/16/2014 3.8 3.4 - 5.0 g/dL Final  ? ?Alkaline Phosphatase  ?Date Value Ref Range Status  ?07/25/2021 157 (H) 44 - 121 IU/L Final  ?06/16/2014 88 Unit/L Final  ?  Comment:  ?  45-117 ?NOTE: New Reference Range ?11/14/13 ?  ? ?ALT  ?Date Value Ref Range Status  ?07/25/2021 10 0 - 44 IU/L Final  ? ?SGPT (ALT)  ?Date Value Ref Range Status  ?06/16/2014 18 12 - 78 U/L Final   ? ?AST  ?Date Value Ref Range Status  ?07/25/2021 26 0 - 40 IU/L Final  ? ?SGOT(AST)  ?Date Value Ref Range Status  ?06/16/2014 16 15 - 37 Unit/L Final  ? ?BUN  ?Date Value Ref Range Status  ?07/25/2021 10 6 - 24 mg/dL Final  ?06/16/2014 8 7 - 18 mg/dL Final  ? ?Calcium  ?Date Value Ref Range Status  ?07/25/2021 9.3 8.7 - 10.2 mg/dL Final  ? ?Calcium, Total  ?Date Value Ref Range Status  ?06/16/2014 8.5 8.5 - 10.1 mg/dL Final  ? ?CO2  ?Date Value Ref Range Status  ?07/25/2021 21 20 - 29 mmol/L Final  ? ?Co2  ?Date Value Ref Range Status  ?06/16/2014 24 21 - 32 mmol/L Final  ? ?Creatinine  ?Date Value Ref Range Status  ?06/16/2014 0.87 0.60 - 1.30 mg/dL Final  ? ?Creatinine, Ser  ?Date Value Ref Range Status  ?07/25/2021 0.96 0.76 - 1.27 mg/dL Final  ? ?Glucose  ?Date Value Ref Range Status  ?07/25/2021 89 65 - 99 mg/dL Final  ?06/16/2014 119 (H) 65 - 99 mg/dL Final  ? ?Glucose, Bld  ?Date Value Ref Range Status  ?05/21/2020 92 70 - 99 mg/dL Final  ?  Comment:  ?  Glucose reference range applies only to samples taken after fasting for at least 8 hours.  ? ?Potassium  ?Date Value Ref Range Status  ?07/25/2021 CANCELED mmol/L   ?  Comment:  ?  Test not performed. Specimen is hemolyzed. Unable to obtain ?valid results. ? ?Result canceled by the ancillary. ?  ?06/16/2014 3.7 3.5 - 5.1 mmol/L Final  ? ?Sodium  ?Date Value Ref Range Status  ?07/25/2021 138 134 - 144 mmol/L Final  ?06/16/2014 140 136 - 145 mmol/L Final  ? ?Bilirubin,Total  ?Date Value Ref Range Status  ?06/16/2014 0.6 0.2 - 1.0 mg/dL Final  ? ?Bilirubin Total  ?Date Value Ref Range Status  ?07/25/2021 0.3 0.0 - 1.2 mg/dL Final  ? ?Protein, ur  ?Date Value Ref Range Status  ?11/02/2019 NEGATIVE NEGATIVE mg/dL Final  ? ?Protein,UA  ?Date Value Ref Range Status  ?07/25/2021 Negative Negative/Trace Final  ? ?Total Protein  ?  Date Value Ref Range Status  ?07/25/2021 7.7 6.0 - 8.5 g/dL Final  ?06/16/2014 6.7 6.4 - 8.2 g/dL Final  ? ?EGFR (African American)  ?Date  Value Ref Range Status  ?06/16/2014 >60  Final  ? ?GFR calc Af Wyvonnia Lora  ?Date Value Ref Range Status  ?06/02/2020 102 >59 mL/min/1.73 Final  ?  Comment:  ?  **[facility] currently reports eGFR in compliance with the current** ?  recommendations of the [employer]. [facility] will ?  update reporting as new guidelines are published from the NKF-ASN ?  Task force. ?  ? ?eGFR  ?Date Value Ref Range Status  ?07/25/2021 96 >59 mL/min/1.73 Final  ? ?EGFR (Non-African Amer.)  ?Date Value Ref Range Status  ?06/16/2014 >60  Final  ?  Comment:  ?  eGFR values <55mL/min/1.73 m2 may be an indication of chronic ?kidney disease (CKD). ?Calculated eGFR is useful in patients with stable renal function. ?The eGFR calculation will not be reliable in acutely ill patients ?when serum creatinine is changing rapidly. It is not useful in  ?patients on dialysis. The eGFR calculation may not be applicable ?to patients at the low and high extremes of body sizes, pregnant ?women, and vegetarians. ?  ? ?GFR calc non Af Amer  ?Date Value Ref Range Status  ?06/02/2020 88 >59 mL/min/1.73 Final  ? ?  ?  ?  ?  ?

## 2022-05-13 ENCOUNTER — Other Ambulatory Visit: Payer: Self-pay | Admitting: Family Medicine

## 2022-05-16 NOTE — Telephone Encounter (Signed)
Requested medication (s) are due for refill today:   Provider to review  Requested medication (s) are on the active medication list:   Yes   Future visit scheduled:   Yes in 1 wk   Last ordered: 05/02/2022 #30, 1 refill  Non delegated refill.   Duplicate request asking for 90 day supply   Requested Prescriptions  Pending Prescriptions Disp Refills   QUEtiapine (SEROQUEL) 300 MG tablet [Pharmacy Med Name: QUETIAPINE FUMARATE 300 MG TAB] 90 tablet 1    Sig: TAKE 1 TABLET BY MOUTH EVERYDAY AT BEDTIME     Not Delegated - Psychiatry:  Antipsychotics - Second Generation (Atypical) - quetiapine Failed - 05/13/2022  4:57 PM      Failed - This refill cannot be delegated      Failed - Valid encounter within last 6 months    Recent Outpatient Visits           6 months ago Bipolar affective disorder, depressed, severe, with psychotic behavior ([place])   [place], Jolene T, NP   9 months ago Routine general medical examination at a health care facility   [facility], [place] P, DO   9 months ago Closed nondisplaced fracture of medial cuneiform of left foot with delayed healing, subsequent encounter   [place], Megan P, DO   1 year ago Peripheral edema   [facility] [place], [place], DO   2 years ago Lumbar radicular pain   [facility] [place], [place], DO       Future Appointments             In 3 weeks Johnson, Megan P, DO [place], [facility]             Failed - Lipid Panel in normal range within the last 12 months    Cholesterol, Total  Date Value Ref Range Status  07/25/2021 180 100 - 199 mg/dL Final   LDL Chol Calc (NIH)  Date Value Ref Range Status  07/25/2021 121 (H) 0 - 99 mg/dL Final   HDL  Date Value Ref Range Status  07/25/2021 41 >39 mg/dL Final   Triglycerides  Date Value Ref Range Status  07/25/2021 100 0 - 149 mg/dL Final         Passed - TSH in normal  range and within 360 days    TSH  Date Value Ref Range Status  07/25/2021 3.110 0.450 - 4.500 uIU/mL Final         Passed - Completed PHQ-2 or PHQ-9 in the last 360 days      Passed - Last BP in normal range    BP Readings from Last 1 Encounters:  10/21/21 132/88         Passed - Last Heart Rate in normal range    Pulse Readings from Last 1 Encounters:  10/21/21 96         Passed - CBC within normal limits and completed in the last 12 months    WBC  Date Value Ref Range Status  07/25/2021 7.6 3.4 - 10.8 x10E3/uL Final  05/21/2020 10.5 4.0 - 10.5 K/uL Final   RBC  Date Value Ref Range Status  07/25/2021 5.09 4.14 - 5.80 x10E6/uL Final  05/21/2020 5.12 4.22 - 5.81 MIL/uL Final   Hemoglobin  Date Value Ref Range Status  07/25/2021 15.1 13.0 - 17.7 g/dL Final   Hematocrit  Date Value Ref Range Status  07/25/2021  44.8 37.5 - 51.0 % Final   MCHC  Date Value Ref Range Status  07/25/2021 33.7 31.5 - 35.7 g/dL Final  05/21/2020 35.5 30.0 - 36.0 g/dL Final   [facility]  Date Value Ref Range Status  07/25/2021 29.7 26.6 - 33.0 pg Final  05/21/2020 31.1 26.0 - 34.0 pg Final   MCV  Date Value Ref Range Status  07/25/2021 88 79 - 97 fL Final  06/16/2014 93 80 - 100 fL Final   No results found for: PLTCOUNTKUC, LABPLAT, POCPLA RDW  Date Value Ref Range Status  07/25/2021 13.2 11.6 - 15.4 % Final  06/16/2014 13.0 11.5 - 14.5 % Final         Passed - CMP within normal limits and completed in the last 12 months    Albumin  Date Value Ref Range Status  07/25/2021 4.8 4.0 - 5.0 g/dL Final  06/16/2014 3.8 3.4 - 5.0 g/dL Final   Alkaline Phosphatase  Date Value Ref Range Status  07/25/2021 157 (H) 44 - 121 IU/L Final  06/16/2014 88 Unit/L Final    Comment:    45-117 NOTE: New Reference Range 11/14/13    ALT  Date Value Ref Range Status  07/25/2021 10 0 - 44 IU/L Final   SGPT (ALT)  Date Value Ref Range Status  06/16/2014 18 12 - 78 U/L Final   AST  Date Value  Ref Range Status  07/25/2021 26 0 - 40 IU/L Final   SGOT(AST)  Date Value Ref Range Status  06/16/2014 16 15 - 37 Unit/L Final   BUN  Date Value Ref Range Status  07/25/2021 10 6 - 24 mg/dL Final  06/16/2014 8 7 - 18 mg/dL Final   Calcium  Date Value Ref Range Status  07/25/2021 9.3 8.7 - 10.2 mg/dL Final   Calcium, Total  Date Value Ref Range Status  06/16/2014 8.5 8.5 - 10.1 mg/dL Final   CO2  Date Value Ref Range Status  07/25/2021 21 20 - 29 mmol/L Final   Co2  Date Value Ref Range Status  06/16/2014 24 21 - 32 mmol/L Final   Creatinine  Date Value Ref Range Status  06/16/2014 0.87 0.60 - 1.30 mg/dL Final   Creatinine, Ser  Date Value Ref Range Status  07/25/2021 0.96 0.76 - 1.27 mg/dL Final   Glucose  Date Value Ref Range Status  07/25/2021 89 65 - 99 mg/dL Final  06/16/2014 119 (H) 65 - 99 mg/dL Final   Glucose, Bld  Date Value Ref Range Status  05/21/2020 92 70 - 99 mg/dL Final    Comment:    Glucose reference range applies only to samples taken after fasting for at least 8 hours.   Potassium  Date Value Ref Range Status  07/25/2021 CANCELED mmol/L     Comment:    Test not performed. Specimen is hemolyzed. Unable to obtain valid results.  Result canceled by the ancillary.   06/16/2014 3.7 3.5 - 5.1 mmol/L Final   Sodium  Date Value Ref Range Status  07/25/2021 138 134 - 144 mmol/L Final  06/16/2014 140 136 - 145 mmol/L Final   Bilirubin,Total  Date Value Ref Range Status  06/16/2014 0.6 0.2 - 1.0 mg/dL Final   Bilirubin Total  Date Value Ref Range Status  07/25/2021 0.3 0.0 - 1.2 mg/dL Final   Protein, ur  Date Value Ref Range Status  11/02/2019 NEGATIVE NEGATIVE mg/dL Final   Protein,UA  Date Value Ref Range Status  07/25/2021 Negative Negative/Trace Final  Total Protein  Date Value Ref Range Status  07/25/2021 7.7 6.0 - 8.5 g/dL Final  06/16/2014 6.7 6.4 - 8.2 g/dL Final   EGFR (African American)  Date Value Ref Range  Status  06/16/2014 >60  Final   GFR calc Af Amer  Date Value Ref Range Status  06/02/2020 102 >59 mL/min/1.73 Final    Comment:    **[facility] currently reports eGFR in compliance with the current**   recommendations of the [employer]. [facility] will   update reporting as new guidelines are published from the NKF-ASN   Task force.    eGFR  Date Value Ref Range Status  07/25/2021 96 >59 mL/min/1.73 Final   EGFR (Non-African Amer.)  Date Value Ref Range Status  06/16/2014 >60  Final    Comment:    eGFR values <40m/min/1.73 m2 may be an indication of chronic kidney disease (CKD). Calculated eGFR is useful in patients with stable renal function. The eGFR calculation will not be reliable in acutely ill patients when serum creatinine is changing rapidly. It is not useful in  patients on dialysis. The eGFR calculation may not be applicable to patients at the low and high extremes of body sizes, pregnant women, and vegetarians.    GFR calc non Af Amer  Date Value Ref Range Status  06/02/2020 88 >59 mL/min/1.73 Final

## 2022-05-20 ENCOUNTER — Other Ambulatory Visit: Payer: Self-pay | Admitting: Family Medicine

## 2022-05-23 NOTE — Telephone Encounter (Signed)
Requested medication (s) are due for refill today: no  Requested medication (s) are on the active medication list: yes  Last refill:  05/02/22 #30/1  Future visit scheduled: yes  Notes to clinic:  Unable to refill per protocol, cannot delegate.    Requested Prescriptions  Pending Prescriptions Disp Refills   QUEtiapine (SEROQUEL) 300 MG tablet [Pharmacy Med Name: QUETIAPINE FUMARATE 300 MG TAB] 90 tablet 1    Sig: TAKE 1 TABLET BY MOUTH EVERYDAY AT BEDTIME     Not Delegated - Psychiatry:  Antipsychotics - Second Generation (Atypical) - quetiapine Failed - 05/20/2022  3:12 PM      Failed - This refill cannot be delegated      Failed - Valid encounter within last 6 months    Recent Outpatient Visits           7 months ago Bipolar affective disorder, depressed, severe, with psychotic behavior ([place])   [place], Jolene T, NP   9 months ago Routine general medical examination at a health care facility   [facility], [place], DO   10 months ago Closed nondisplaced fracture of medial cuneiform of left foot with delayed healing, subsequent encounter   [place], Megan P, DO   1 year ago Peripheral edema   [facility] [place], [place], DO   2 years ago Lumbar radicular pain   [facility] [place], [place], DO       Future Appointments             In 2 weeks Johnson, Megan P, DO [place]             Failed - Lipid Panel in normal range within the last 12 months    Cholesterol, Total  Date Value Ref Range Status  07/25/2021 180 100 - 199 mg/dL Final   LDL Chol Calc (NIH)  Date Value Ref Range Status  07/25/2021 121 (H) 0 - 99 mg/dL Final   HDL  Date Value Ref Range Status  07/25/2021 41 >39 mg/dL Final   Triglycerides  Date Value Ref Range Status  07/25/2021 100 0 - 149 mg/dL Final         Passed - TSH in normal range and within 360 days    TSH  Date  Value Ref Range Status  07/25/2021 3.110 0.450 - 4.500 uIU/mL Final         Passed - Completed PHQ-2 or PHQ-9 in the last 360 days      Passed - Last BP in normal range    BP Readings from Last 1 Encounters:  10/21/21 132/88         Passed - Last Heart Rate in normal range    Pulse Readings from Last 1 Encounters:  10/21/21 96         Passed - CBC within normal limits and completed in the last 12 months    WBC  Date Value Ref Range Status  07/25/2021 7.6 3.4 - 10.8 x10E3/uL Final  05/21/2020 10.5 4.0 - 10.5 K/uL Final   RBC  Date Value Ref Range Status  07/25/2021 5.09 4.14 - 5.80 x10E6/uL Final  05/21/2020 5.12 4.22 - 5.81 MIL/uL Final   Hemoglobin  Date Value Ref Range Status  07/25/2021 15.1 13.0 - 17.7 g/dL Final   Hematocrit  Date Value Ref Range Status  07/25/2021 44.8 37.5 - 51.0 % Final   MCHC  Date Value Ref Range  Status  07/25/2021 33.7 31.5 - 35.7 g/dL Final  05/21/2020 35.5 30.0 - 36.0 g/dL Final   [facility]  Date Value Ref Range Status  07/25/2021 29.7 26.6 - 33.0 pg Final  05/21/2020 31.1 26.0 - 34.0 pg Final   MCV  Date Value Ref Range Status  07/25/2021 88 79 - 97 fL Final  06/16/2014 93 80 - 100 fL Final   No results found for: PLTCOUNTKUC, LABPLAT, POCPLA RDW  Date Value Ref Range Status  07/25/2021 13.2 11.6 - 15.4 % Final  06/16/2014 13.0 11.5 - 14.5 % Final         Passed - CMP within normal limits and completed in the last 12 months    Albumin  Date Value Ref Range Status  07/25/2021 4.8 4.0 - 5.0 g/dL Final  06/16/2014 3.8 3.4 - 5.0 g/dL Final   Alkaline Phosphatase  Date Value Ref Range Status  07/25/2021 157 (H) 44 - 121 IU/L Final  06/16/2014 88 Unit/L Final    Comment:    45-117 NOTE: New Reference Range 11/14/13    ALT  Date Value Ref Range Status  07/25/2021 10 0 - 44 IU/L Final   SGPT (ALT)  Date Value Ref Range Status  06/16/2014 18 12 - 78 U/L Final   AST  Date Value Ref Range Status  07/25/2021 26 0 - 40  IU/L Final   SGOT(AST)  Date Value Ref Range Status  06/16/2014 16 15 - 37 Unit/L Final   BUN  Date Value Ref Range Status  07/25/2021 10 6 - 24 mg/dL Final  06/16/2014 8 7 - 18 mg/dL Final   Calcium  Date Value Ref Range Status  07/25/2021 9.3 8.7 - 10.2 mg/dL Final   Calcium, Total  Date Value Ref Range Status  06/16/2014 8.5 8.5 - 10.1 mg/dL Final   CO2  Date Value Ref Range Status  07/25/2021 21 20 - 29 mmol/L Final   Co2  Date Value Ref Range Status  06/16/2014 24 21 - 32 mmol/L Final   Creatinine  Date Value Ref Range Status  06/16/2014 0.87 0.60 - 1.30 mg/dL Final   Creatinine, Ser  Date Value Ref Range Status  07/25/2021 0.96 0.76 - 1.27 mg/dL Final   Glucose  Date Value Ref Range Status  07/25/2021 89 65 - 99 mg/dL Final  06/16/2014 119 (H) 65 - 99 mg/dL Final   Glucose, Bld  Date Value Ref Range Status  05/21/2020 92 70 - 99 mg/dL Final    Comment:    Glucose reference range applies only to samples taken after fasting for at least 8 hours.   Potassium  Date Value Ref Range Status  07/25/2021 CANCELED mmol/L     Comment:    Test not performed. Specimen is hemolyzed. Unable to obtain valid results.  Result canceled by the ancillary.   06/16/2014 3.7 3.5 - 5.1 mmol/L Final   Sodium  Date Value Ref Range Status  07/25/2021 138 134 - 144 mmol/L Final  06/16/2014 140 136 - 145 mmol/L Final   Bilirubin,Total  Date Value Ref Range Status  06/16/2014 0.6 0.2 - 1.0 mg/dL Final   Bilirubin Total  Date Value Ref Range Status  07/25/2021 0.3 0.0 - 1.2 mg/dL Final   Protein, ur  Date Value Ref Range Status  11/02/2019 NEGATIVE NEGATIVE mg/dL Final   Protein,UA  Date Value Ref Range Status  07/25/2021 Negative Negative/Trace Final   Total Protein  Date Value Ref Range Status  07/25/2021 7.7 6.0 -  8.5 g/dL Final  06/16/2014 6.7 6.4 - 8.2 g/dL Final   EGFR (African American)  Date Value Ref Range Status  06/16/2014 >60  Final   GFR calc  Af Amer  Date Value Ref Range Status  06/02/2020 102 >59 mL/min/1.73 Final    Comment:    **[facility] currently reports eGFR in compliance with the current**   recommendations of the [employer]. [facility] will   update reporting as new guidelines are published from the NKF-ASN   Task force.    eGFR  Date Value Ref Range Status  07/25/2021 96 >59 mL/min/1.73 Final   EGFR (Non-African Amer.)  Date Value Ref Range Status  06/16/2014 >60  Final    Comment:    eGFR values <53m/min/1.73 m2 may be an indication of chronic kidney disease (CKD). Calculated eGFR is useful in patients with stable renal function. The eGFR calculation will not be reliable in acutely ill patients when serum creatinine is changing rapidly. It is not useful in  patients on dialysis. The eGFR calculation may not be applicable to patients at the low and high extremes of body sizes, pregnant women, and vegetarians.    GFR calc non Af Amer  Date Value Ref Range Status  06/02/2020 88 >59 mL/min/1.73 Final

## 2022-05-27 ENCOUNTER — Other Ambulatory Visit: Payer: Self-pay | Admitting: Family Medicine

## 2022-05-29 NOTE — Telephone Encounter (Signed)
Requested medication (s) are due for refill today: no  Requested medication (s) are on the active medication list: yes  Last refill:  05/02/22 #30/1  Future visit scheduled: yes  Notes to clinic:  pharmacy requesting 90 day supply. Not delegated. Please assess     Requested Prescriptions  Pending Prescriptions Disp Refills   QUEtiapine (SEROQUEL) 300 MG tablet [Pharmacy Med Name: QUETIAPINE FUMARATE 300 MG TAB] 90 tablet 1    Sig: TAKE 1 TABLET BY MOUTH EVERYDAY AT BEDTIME     Not Delegated - Psychiatry:  Antipsychotics - Second Generation (Atypical) - quetiapine Failed - 05/27/2022  5:03 PM      Failed - This refill cannot be delegated      Failed - Valid encounter within last 6 months    Recent Outpatient Visits           7 months ago Bipolar affective disorder, depressed, severe, with psychotic behavior ([place])   [place], Jolene T, NP   9 months ago Routine general medical examination at a health care facility   [facility], [place] P, DO   10 months ago Closed nondisplaced fracture of medial cuneiform of left foot with delayed healing, subsequent encounter   [place], Megan P, DO   1 year ago Peripheral edema   [facility] [place], [place], DO   2 years ago Lumbar radicular pain   [facility] [place], [place], DO       Future Appointments             In 1 week Johnson, Megan P, DO [place]             Failed - Lipid Panel in normal range within the last 12 months    Cholesterol, Total  Date Value Ref Range Status  07/25/2021 180 100 - 199 mg/dL Final   LDL Chol Calc (NIH)  Date Value Ref Range Status  07/25/2021 121 (H) 0 - 99 mg/dL Final   HDL  Date Value Ref Range Status  07/25/2021 41 >39 mg/dL Final   Triglycerides  Date Value Ref Range Status  07/25/2021 100 0 - 149 mg/dL Final         Passed - TSH in normal range and within 360 days     TSH  Date Value Ref Range Status  07/25/2021 3.110 0.450 - 4.500 uIU/mL Final         Passed - Completed PHQ-2 or PHQ-9 in the last 360 days      Passed - Last BP in normal range    BP Readings from Last 1 Encounters:  10/21/21 132/88         Passed - Last Heart Rate in normal range    Pulse Readings from Last 1 Encounters:  10/21/21 96         Passed - CBC within normal limits and completed in the last 12 months    WBC  Date Value Ref Range Status  07/25/2021 7.6 3.4 - 10.8 x10E3/uL Final  05/21/2020 10.5 4.0 - 10.5 K/uL Final   RBC  Date Value Ref Range Status  07/25/2021 5.09 4.14 - 5.80 x10E6/uL Final  05/21/2020 5.12 4.22 - 5.81 MIL/uL Final   Hemoglobin  Date Value Ref Range Status  07/25/2021 15.1 13.0 - 17.7 g/dL Final   Hematocrit  Date Value Ref Range Status  07/25/2021 44.8 37.5 - 51.0 % Final   MCHC  Date  Value Ref Range Status  07/25/2021 33.7 31.5 - 35.7 g/dL Final  01/65/8006 34.9 30.0 - 36.0 g/dL Final   [facility]  Date Value Ref Range Status  07/25/2021 29.7 26.6 - 33.0 pg Final  05/21/2020 31.1 26.0 - 34.0 pg Final   MCV  Date Value Ref Range Status  07/25/2021 88 79 - 97 fL Final  06/16/2014 93 80 - 100 fL Final   No results found for: PLTCOUNTKUC, LABPLAT, POCPLA RDW  Date Value Ref Range Status  07/25/2021 13.2 11.6 - 15.4 % Final  06/16/2014 13.0 11.5 - 14.5 % Final         Passed - CMP within normal limits and completed in the last 12 months    Albumin  Date Value Ref Range Status  07/25/2021 4.8 4.0 - 5.0 g/dL Final  49/44/7395 3.8 3.4 - 5.0 g/dL Final   Alkaline Phosphatase  Date Value Ref Range Status  07/25/2021 157 (H) 44 - 121 IU/L Final  06/16/2014 88 Unit/L Final    Comment:    45-117 NOTE: New Reference Range 11/14/13    ALT  Date Value Ref Range Status  07/25/2021 10 0 - 44 IU/L Final   SGPT (ALT)  Date Value Ref Range Status  06/16/2014 18 12 - 78 U/L Final   AST  Date Value Ref Range Status   07/25/2021 26 0 - 40 IU/L Final   SGOT(AST)  Date Value Ref Range Status  06/16/2014 16 15 - 37 Unit/L Final   BUN  Date Value Ref Range Status  07/25/2021 10 6 - 24 mg/dL Final  84/41/7127 8 7 - 18 mg/dL Final   Calcium  Date Value Ref Range Status  07/25/2021 9.3 8.7 - 10.2 mg/dL Final   Calcium, Total  Date Value Ref Range Status  06/16/2014 8.5 8.5 - 10.1 mg/dL Final   CO2  Date Value Ref Range Status  07/25/2021 21 20 - 29 mmol/L Final   Co2  Date Value Ref Range Status  06/16/2014 24 21 - 32 mmol/L Final   Creatinine  Date Value Ref Range Status  06/16/2014 0.87 0.60 - 1.30 mg/dL Final   Creatinine, Ser  Date Value Ref Range Status  07/25/2021 0.96 0.76 - 1.27 mg/dL Final   Glucose  Date Value Ref Range Status  07/25/2021 89 65 - 99 mg/dL Final  87/18/3672 550 (H) 65 - 99 mg/dL Final   Glucose, Bld  Date Value Ref Range Status  05/21/2020 92 70 - 99 mg/dL Final    Comment:    Glucose reference range applies only to samples taken after fasting for at least 8 hours.   Potassium  Date Value Ref Range Status  07/25/2021 CANCELED mmol/L     Comment:    Test not performed. Specimen is hemolyzed. Unable to obtain valid results.  Result canceled by the ancillary.   06/16/2014 3.7 3.5 - 5.1 mmol/L Final   Sodium  Date Value Ref Range Status  07/25/2021 138 134 - 144 mmol/L Final  06/16/2014 140 136 - 145 mmol/L Final   Bilirubin,Total  Date Value Ref Range Status  06/16/2014 0.6 0.2 - 1.0 mg/dL Final   Bilirubin Total  Date Value Ref Range Status  07/25/2021 0.3 0.0 - 1.2 mg/dL Final   Protein, ur  Date Value Ref Range Status  11/02/2019 NEGATIVE NEGATIVE mg/dL Final   Protein,UA  Date Value Ref Range Status  07/25/2021 Negative Negative/Trace Final   Total Protein  Date Value Ref Range Status  07/25/2021 7.7 6.0 - 8.5 g/dL Final  06/16/2014 6.7 6.4 - 8.2 g/dL Final   EGFR (African American)  Date Value Ref Range Status  06/16/2014  >60  Final   GFR calc Af Amer  Date Value Ref Range Status  06/02/2020 102 >59 mL/min/1.73 Final    Comment:    **[facility] currently reports eGFR in compliance with the current**   recommendations of the [employer]. [facility] will   update reporting as new guidelines are published from the NKF-ASN   Task force.    eGFR  Date Value Ref Range Status  07/25/2021 96 >59 mL/min/1.73 Final   EGFR (Non-African Amer.)  Date Value Ref Range Status  06/16/2014 >60  Final    Comment:    eGFR values <41mL/min/1.73 m2 may be an indication of chronic kidney disease (CKD). Calculated eGFR is useful in patients with stable renal function. The eGFR calculation will not be reliable in acutely ill patients when serum creatinine is changing rapidly. It is not useful in  patients on dialysis. The eGFR calculation may not be applicable to patients at the low and high extremes of body sizes, pregnant women, and vegetarians.    GFR calc non Af Amer  Date Value Ref Range Status  06/02/2020 88 >59 mL/min/1.73 Final

## 2022-06-07 ENCOUNTER — Telehealth: Payer: Self-pay

## 2022-06-07 ENCOUNTER — Telehealth: Payer: Medicaid Other | Admitting: Family Medicine

## 2022-06-07 NOTE — Telephone Encounter (Signed)
Called placed to patient. Patient was scheduled to see Dr. Wynetta Emery today but patient is in [place]. Per Dr. Wynetta Emery, she cannot see the patient due to him being out of state. Patient verbalized understanding of this and states that he has a new provider there. Appointment cancelled for today.
